# Patient Record
Sex: Female | Born: 1970 | Race: White | Hispanic: No | Marital: Married
Health system: Northeastern US, Community
[De-identification: ages and names within clinical notes are randomized; demographics above are authoritative.]

## PROBLEM LIST (undated history)

## (undated) VITALS — BP 143/80 | HR 93 | Temp 98.0°F | Resp 16 | Ht 64.0 in | Wt 280.0 lb

## (undated) DIAGNOSIS — K219 Gastro-esophageal reflux disease without esophagitis: Secondary | ICD-10-CM

## (undated) DIAGNOSIS — J45909 Unspecified asthma, uncomplicated: Secondary | ICD-10-CM

## (undated) DIAGNOSIS — A0472 Enterocolitis due to Clostridium difficile, not specified as recurrent: Secondary | ICD-10-CM

## (undated) DIAGNOSIS — T7840XA Allergy, unspecified, initial encounter: Secondary | ICD-10-CM

## (undated) DIAGNOSIS — K319 Disease of stomach and duodenum, unspecified: Secondary | ICD-10-CM

## (undated) DIAGNOSIS — Z973 Presence of spectacles and contact lenses: Secondary | ICD-10-CM

## (undated) DIAGNOSIS — K2900 Acute gastritis without bleeding: Secondary | ICD-10-CM

## (undated) DIAGNOSIS — F419 Anxiety disorder, unspecified: Secondary | ICD-10-CM

## (undated) DIAGNOSIS — R0683 Snoring: Secondary | ICD-10-CM

## (undated) DIAGNOSIS — E7800 Pure hypercholesterolemia, unspecified: Secondary | ICD-10-CM

## (undated) HISTORY — DX: Acute gastritis without bleeding: K29.00

## (undated) HISTORY — PX: NO SIGNIFICANT SURGICAL HISTORY: 1000005

## (undated) HISTORY — DX: Enterocolitis due to Clostridium difficile, not specified as recurrent: A04.72

## (undated) HISTORY — PX: CHOLECYSTECTOMY: GID484

## (undated) HISTORY — DX: Allergy, unspecified, initial encounter: T78.40XA

## (undated) HISTORY — PX: OB ANTEPARTUM CARE CESAREAN DLVR & POSTPARTUM: REP299

---

## 2001-12-17 ENCOUNTER — Encounter (HOSPITAL_BASED_OUTPATIENT_CLINIC_OR_DEPARTMENT_OTHER): Payer: Charity | Admitting: Internal Medicine

## 2001-12-17 LAB — BLOOD COUNT COMPLETE AUTOMATED
HEMATOCRIT: 38 % (ref 37.0–47.0)
HEMOGLOBIN: 12.7 g/dL (ref 12.0–16.0)
MEAN CORP HGB CONC: 33.5 g/dL (ref 32.0–36.0)
MEAN CORPUSCULAR HGB: 30.8 pg (ref 27.0–31.0)
MEAN CORPUSCULAR VOL: 91.8 fL (ref 81.0–99.0)
MEAN PLATELET VOLUME: 9.1 fL (ref 6.4–10.8)
PLATELET COUNT: 251 10*3/uL (ref 150–400)
RBC DISTRIBUTION WIDTH: 12.1 % (ref 11.5–14.3)
RED BLOOD CELL COUNT: 4.13 MIL/uL — ABNORMAL LOW (ref 4.20–5.40)
WHITE BLOOD CELL COUNT: 10.2 10*3/uL (ref 4.8–10.8)

## 2001-12-17 LAB — CHG LIPID PANEL
Cholesterol: 224 mg/dl — ABNORMAL HIGH (ref ?–200)
HIGH DENSITY LIPOPROTEIN: 44 mg/dl (ref 35–95)
LOW DENSITY LIPOPROTEIN DIRECT: 172 mg/dl — ABNORMAL HIGH (ref ?–130)
RISK FACTOR: 5.1 — ABNORMAL HIGH (ref ?–4.4)
TRIGLYCERIDES: 136 mg/dl (ref 30–200)

## 2001-12-17 LAB — COMPREHENSIVE METABOLIC PANEL
ALANINE AMINOTRANSFERASE: 15 IU/L (ref 3–36)
ALBUMIN: 4.1 g/dl (ref 3.5–5.0)
ALKALINE PHOSPHATASE: 72 IU/L (ref 50–136)
ASPARTATE AMINOTRANSFERASE: 18 U/L (ref 7–29)
BILIRUBIN TOTAL: 0.6 mg/dl (ref 0.15–1.0)
BUN (UREA NITROGEN): 16 mg/dl (ref 10–20)
CALCIUM: 9.5 mg/dl (ref 8.5–10.5)
CARBON DIOXIDE: 28 mEQ/L (ref 22–32)
CHLORIDE: 103 mEQ/L (ref 98–110)
CREATININE: 0.8 mg/dl (ref 0.8–1.2)
Glucose Random: 85 mg/dl (ref 65–160)
POTASSIUM: 4.2 mEQ/L (ref 3.5–5.0)
SODIUM: 134 mEQ/L — ABNORMAL LOW (ref 135–145)
TOTAL PROTEIN: 7.3 g/dl (ref 6.2–8.5)

## 2001-12-17 LAB — THYROID SCREEN TSH REFLEX FT4: THYROID SCREEN TSH REFLEX FT4: 1.18 u[IU]/mL (ref 0.34–5.60)

## 2002-01-03 ENCOUNTER — Emergency Department (HOSPITAL_BASED_OUTPATIENT_CLINIC_OR_DEPARTMENT_OTHER): Payer: Self-pay | Admitting: Emergency Medicine

## 2002-01-04 ENCOUNTER — Ambulatory Visit (HOSPITAL_BASED_OUTPATIENT_CLINIC_OR_DEPARTMENT_OTHER): Payer: Self-pay | Admitting: Internal Medicine

## 2002-01-10 ENCOUNTER — Other Ambulatory Visit: Payer: Self-pay | Admitting: Internal Medicine

## 2002-01-25 ENCOUNTER — Ambulatory Visit (HOSPITAL_BASED_OUTPATIENT_CLINIC_OR_DEPARTMENT_OTHER): Payer: Self-pay | Admitting: Internal Medicine

## 2002-02-02 LAB — US TRANSVAGINAL NON-OB

## 2002-02-02 LAB — US PELVIC NON-PREGNANT

## 2002-02-18 ENCOUNTER — Ambulatory Visit (HOSPITAL_BASED_OUTPATIENT_CLINIC_OR_DEPARTMENT_OTHER): Payer: Self-pay | Admitting: Internal Medicine

## 2002-03-01 ENCOUNTER — Ambulatory Visit (HOSPITAL_BASED_OUTPATIENT_CLINIC_OR_DEPARTMENT_OTHER): Payer: Self-pay | Admitting: Internal Medicine

## 2002-03-20 ENCOUNTER — Ambulatory Visit (HOSPITAL_BASED_OUTPATIENT_CLINIC_OR_DEPARTMENT_OTHER): Payer: Self-pay | Admitting: Internal Medicine

## 2002-04-06 ENCOUNTER — Encounter: Payer: Self-pay | Admitting: OB/Gyn

## 2002-05-11 ENCOUNTER — Encounter (HOSPITAL_BASED_OUTPATIENT_CLINIC_OR_DEPARTMENT_OTHER): Payer: Self-pay | Admitting: Internal Medicine

## 2002-05-11 ENCOUNTER — Ambulatory Visit (HOSPITAL_BASED_OUTPATIENT_CLINIC_OR_DEPARTMENT_OTHER): Payer: Charity | Admitting: Internal Medicine

## 2002-05-11 VITALS — BP 106/76 | Wt 192.0 lb

## 2002-05-11 DIAGNOSIS — R5381 Other malaise: Secondary | ICD-10-CM

## 2002-05-11 DIAGNOSIS — R5383 Other fatigue: Secondary | ICD-10-CM

## 2002-05-11 DIAGNOSIS — N912 Amenorrhea, unspecified: Secondary | ICD-10-CM

## 2002-05-11 LAB — URINE PREGNANCY TEST (POINT OF CARE): HCG QUALITATIVE URINE: NEGATIVE

## 2002-05-11 NOTE — Progress Notes (Signed)
Alejandra Martin is a 32 year old female here because she has been feeling fatigued. She just switched to a new job which is less stressful, and she has noticed for the last week that she is tired. She is also trying to lose weight so she has been exercising an hour each day and eating less. She wonders if she is anemic.   She also stopped OCPs last months and is a few days late for her period. She is having some cramps so might be getting it.     PE:   S1 and S2 normal, no murmurs, clicks, gallops or rubs. Regular rate and rhythm. Chest is clear; no wheezes or rales.     PLAN:  amenorhea - HCG negative  fatigue - RTC next week to check blood work  FP- start MVI with folate

## 2002-05-18 ENCOUNTER — Other Ambulatory Visit (HOSPITAL_BASED_OUTPATIENT_CLINIC_OR_DEPARTMENT_OTHER): Payer: Charity | Admitting: Lab

## 2002-05-18 LAB — BLOOD COUNT COMPLETE AUTOMATED
HEMATOCRIT: 38.1 % (ref 37.0–47.0)
HEMOGLOBIN: 13 g/dL (ref 12.0–16.0)
MEAN CORP HGB CONC: 34.1 g/dL (ref 32.0–36.0)
MEAN CORPUSCULAR HGB: 31.7 pg — ABNORMAL HIGH (ref 27.0–31.0)
MEAN CORPUSCULAR VOL: 92.8 fL (ref 81.0–99.0)
MEAN PLATELET VOLUME: 8.6 fL (ref 6.4–10.8)
PLATELET COUNT: 262 10*3/uL (ref 150–400)
RBC DISTRIBUTION WIDTH: 12 % (ref 11.5–14.3)
RED BLOOD CELL COUNT: 4.11 MIL/uL — ABNORMAL LOW (ref 4.20–5.40)
WHITE BLOOD CELL COUNT: 5.7 10*3/uL (ref 4.8–10.8)

## 2002-05-18 LAB — BASIC METABOLIC PANEL
BUN (UREA NITROGEN): 14 mg/dl (ref 10–20)
CALCIUM: 9.9 mg/dl (ref 8.5–10.5)
CARBON DIOXIDE: 29 mEQ/L (ref 22–32)
CHLORIDE: 107 mEQ/L (ref 98–110)
CREATININE: 0.8 mg/dl (ref 0.8–1.2)
Glucose Random: 99 mg/dl (ref 65–160)
POTASSIUM: 4.8 mEQ/L (ref 3.5–5.0)
SODIUM: 139 mEQ/L (ref 135–145)

## 2002-05-18 LAB — RUBELLA IGG ANTIBODY: RUBELLA: IMMUNE

## 2002-06-28 ENCOUNTER — Telehealth (HOSPITAL_BASED_OUTPATIENT_CLINIC_OR_DEPARTMENT_OTHER): Payer: Self-pay | Admitting: Internal Medicine

## 2002-06-28 NOTE — Telephone Encounter (Signed)
>>   [name] Fri Jul 01, 2002 6:39 PM  She called becasue she is concerned about her husband mackenze grandison because he has had a persistent dry cough that keeps coming back.   - beasue he is a smoker, will check CXR (slip mailed to pt) and then have him come in for an appt and evaluation (? need for PPD)    >> [name] Fri Jul 01, 2002 2:36 PM  Last time her husband saw me he had a dry cough. may be allergies. [name]    >> LAURA OBBARD Fri Jul 01, 2002 9:20 AM  [name] called back yesterday, and I called her back this AM, but there was no answer t her work number [phone]) and I left a message at her home message machine    >> [name] Tue Jun 28, 2002 7:31 PM  I have attempted without success to contact this patient by phone to return their call and I left a message on answering machine at her home.      >> [name] Tue Jun 28, 2002 7:31 PM  Staff Message copied by [name] on 06/28/2002 at 7:31 PM  ------   Message from: Mardelle Matte   Created: 06/28/2002 at 1:23 PM    Alejandra Martin [id number], 32 year old, female, [phone] (home)    Calls today with questions and concerns. please call her at work [phone]

## 2002-10-17 ENCOUNTER — Telehealth (HOSPITAL_BASED_OUTPATIENT_CLINIC_OR_DEPARTMENT_OTHER): Payer: Self-pay

## 2002-10-17 NOTE — Telephone Encounter (Signed)
>>   MAURICE CONNORS Mon Oct 17, 2002 10:47 AM  I SPOKE WITH Sussie WHO IS ACTUALLY CALLING ABOUT HER HUSBADS COUGH STATES DR Domenica Reamer SAID RECENT CHEST XRAY OK HE IS A SMOKER AND I ADVISED Pearl TO CALL AND MAKE APPT NEXT WEEK WITH REGULAR PCP DR Domenica Reamer FOR HER HUSBAND

## 2003-04-10 ENCOUNTER — Other Ambulatory Visit: Payer: Self-pay | Admitting: Emergency Medicine

## 2003-04-10 ENCOUNTER — Emergency Department (HOSPITAL_BASED_OUTPATIENT_CLINIC_OR_DEPARTMENT_OTHER): Payer: Self-pay

## 2003-04-10 LAB — URINALYSIS
BACTERIA: <10 PER HPF — AB (ref 0–?)
BILIRUBIN, URINE: NEGATIVE
CASTS: NONE SEEN PER LPF
CRYSTALS: NONE SEEN
GLUCOSE, URINE: NEGATIVE MG/DL
KETONE, URINE: NEGATIVE MG/DL
LEUKOCYTE ESTERASE: NEGATIVE
NITRITE, URINE: NEGATIVE
PH URINE: 6.5 (ref 5.0–8.0)
SPECIFIC GRAVITY URINE: 1.015 (ref 1.003–1.035)

## 2003-04-10 LAB — BLOOD COUNT COMPLETE AUTO&AUTO DIFRNTL WBC
BASOPHIL %: 1 % (ref 0–2)
EOSINOPHIL %: 0.8 % (ref 0–5)
HEMATOCRIT: 39.8 % (ref 34.9–46.9)
HEMOGLOBIN: 13.1 g/dL (ref 11.7–15.7)
LYMPHOCYTE %: 26.2 % (ref 20–45)
MEAN CORP HGB CONC: 32.8 g/dL (ref 31.4–35.8)
MEAN CORPUSCULAR HGB: 31.7 pg (ref 26.4–34.0)
MEAN CORPUSCULAR VOL: 96.6 fl (ref 80.5–99.7)
MEAN PLATELET VOLUME: 10.3 fL (ref 6.8–12.8)
MONOCYTE %: 5.3 % (ref 0–12)
NEUTROPHIL %: 66.7 % (ref 40–70)
PLATELET COUNT: 306 10*3/uL (ref 130–400)
RBC DISTRIBUTION WIDTH: 11.3 % (ref 11.0–15.0)
RED BLOOD CELL COUNT: 4.12 M/uL (ref 3.80–5.20)
WHITE BLOOD CELL COUNT: 11.2 10*3/uL — ABNORMAL HIGH (ref 4.5–11.0)

## 2003-04-10 LAB — COMPREHENSIVE METABOLIC PANEL
ALANINE AMINOTRANSFERASE: 11 U/L (ref 0–31)
ALBUMIN: 4.4 g/dl (ref 3.2–4.9)
ALKALINE PHOSPHATASE: 76 U/L (ref 30–115)
ASPARTATE AMINOTRANSFERASE: 15 U/L (ref 0–31)
BILIRUBIN TOTAL: 0.1 mg/dl — ABNORMAL LOW (ref 0.2–1.2)
BUN (UREA NITROGEN): 15 mg/dl (ref 6–20)
CALCIUM: 9.9 mg/dl (ref 8.4–10.2)
CARBON DIOXIDE: 28 mEQ/L (ref 22–31)
CHLORIDE: 103 mEQ/L (ref 96–106)
CREATININE: 0.6 mg/dl (ref 0.4–1.2)
Glucose Random: 90 mg/dl (ref 70–110)
POTASSIUM: 4.1 mEQ/L (ref 3.4–5.0)
SODIUM: 138 mEQ/L (ref 134–145)
TOTAL PROTEIN: 7.9 g/dl (ref 6.5–8.4)

## 2003-04-10 LAB — PATIENT ABO/RH CONFIRM

## 2003-04-10 LAB — HCG QUANTITATIVE: HCG QUANTITATIVE: 8214 m[IU]/mL — ABNORMAL HIGH (ref 1–3)

## 2003-04-10 LAB — TYPE AND SCREEN

## 2003-04-11 ENCOUNTER — Telehealth (HOSPITAL_BASED_OUTPATIENT_CLINIC_OR_DEPARTMENT_OTHER): Payer: Self-pay

## 2003-04-11 LAB — US PREG UTERUS REAL TIME W/IMAGE DCMTN TRANSVAG

## 2003-04-11 NOTE — Telephone Encounter (Signed)
Pt called for cramps and bleeding with pregnancy. Called pt who states she had some bleeding over the weekend and went to the [facility] where she states she had blood work and an ultra sound and was told that she is "ok" but that she needs to be seen by her PCP or ob gyn. Pt states she was also told that if the bleeding and cramping gets worse she should return to the ER. Pt asked about being seen at [facility] for ob gyn. Pt was advised to call today to set up an appointment and to tell them when she calls that she has had some bleeding and would like to be seen as soon as possible. Pt states that today she is feeling better. It was reinforce to the pt that if she does have any more bleeding that she should be seen in the ER again. Pt agrees to plan.

## 2003-04-12 ENCOUNTER — Ambulatory Visit: Payer: Self-pay

## 2003-04-12 LAB — HCG QUANTITATIVE: HCG QUANTITATIVE: 6643 m[IU]/mL — ABNORMAL HIGH (ref 1–3)

## 2003-04-12 LAB — URINALYSIS
BACTERIA: <10 PER HPF — AB (ref 0–?)
BILIRUBIN, URINE: NEGATIVE
CASTS: NONE SEEN PER LPF
CRYSTALS: NONE SEEN
GLUCOSE, URINE: NEGATIVE MG/DL
KETONE, URINE: NEGATIVE MG/DL
LEUKOCYTE ESTERASE: NEGATIVE
NITRITE, URINE: NEGATIVE
PH URINE: 6 (ref 5.0–8.0)
PROTEIN, URINE: NEGATIVE MG/DL
SPECIFIC GRAVITY URINE: 1.02 (ref 1.003–1.035)

## 2003-04-12 LAB — URINE CULTURE/COLONY COUNT: URINE CULTURE/COLONY COUNT: NO GROWTH

## 2003-04-13 LAB — URINE CULTURE/COLONY COUNT

## 2003-04-14 ENCOUNTER — Encounter (HOSPITAL_BASED_OUTPATIENT_CLINIC_OR_DEPARTMENT_OTHER): Payer: Self-pay

## 2003-04-14 ENCOUNTER — Encounter: Payer: Self-pay | Admitting: Primary Care

## 2003-04-19 ENCOUNTER — Ambulatory Visit: Payer: Self-pay | Admitting: Primary Care

## 2003-04-19 DIAGNOSIS — O039 Complete or unspecified spontaneous abortion without complication: Secondary | ICD-10-CM

## 2003-04-19 LAB — HCG QUANTITATIVE: HCG QUANTITATIVE: 368 m[IU]/mL — ABNORMAL HIGH (ref 1–3)

## 2003-04-19 LAB — THYROID SCREEN TSH REFLEX FT4: THYROID SCREEN TSH REFLEX FT4: 1.69 u[IU]/mL (ref 0.34–5.60)

## 2003-04-25 ENCOUNTER — Other Ambulatory Visit: Payer: Self-pay | Admitting: Primary Care

## 2003-04-25 DIAGNOSIS — Z09 Encounter for follow-up examination after completed treatment for conditions other than malignant neoplasm: Secondary | ICD-10-CM

## 2003-04-25 DIAGNOSIS — N83209 Unspecified ovarian cyst, unspecified side: Secondary | ICD-10-CM

## 2003-05-05 ENCOUNTER — Other Ambulatory Visit: Payer: Self-pay | Admitting: Primary Care

## 2003-05-05 DIAGNOSIS — O99810 Abnormal glucose complicating pregnancy: Secondary | ICD-10-CM

## 2003-05-05 LAB — HCG QUANTITATIVE: HCG QUANTITATIVE: 13 m[IU]/mL — ABNORMAL HIGH (ref 1–3)

## 2003-05-05 LAB — GLUCOSE FASTING: GLUCOSE FASTING: 79 mg/dl (ref 65–110)

## 2003-05-08 LAB — US PELVIC NON-PREGNANT

## 2003-05-08 LAB — US TRANSVAGINAL NON-OB

## 2003-06-21 ENCOUNTER — Encounter: Payer: Self-pay | Admitting: Primary Care

## 2003-06-28 ENCOUNTER — Encounter: Payer: Self-pay | Admitting: Primary Care

## 2003-07-04 ENCOUNTER — Other Ambulatory Visit: Payer: Self-pay | Admitting: Primary Care

## 2003-07-04 ENCOUNTER — Encounter: Payer: Self-pay | Admitting: Obstetrics & Gynecology

## 2003-07-04 DIAGNOSIS — Z3400 Encounter for supervision of normal first pregnancy, unspecified trimester: Secondary | ICD-10-CM

## 2003-07-06 LAB — HCG QUANTITATIVE: HCG QUANTITATIVE: <1 m[IU]/mL — ABNORMAL LOW (ref 1–3)

## 2003-08-11 ENCOUNTER — Ambulatory Visit: Payer: Self-pay | Admitting: Obstetrics & Gynecology

## 2003-08-11 ENCOUNTER — Ambulatory Visit (HOSPITAL_BASED_OUTPATIENT_CLINIC_OR_DEPARTMENT_OTHER): Payer: Self-pay | Admitting: Obstetrics & Gynecology

## 2003-08-14 LAB — IADNA CHLAMYDIA TRACHOMATIS DIRECT PROBE TQ: GENPROBE CHLAMYDIA: NEGATIVE

## 2003-08-14 LAB — IADNA NEISSERIA GONORRHOEAE DIRECT PROBE TQ: GENPROBE GC: NEGATIVE

## 2003-09-06 ENCOUNTER — Ambulatory Visit (HOSPITAL_BASED_OUTPATIENT_CLINIC_OR_DEPARTMENT_OTHER): Payer: Charity | Admitting: Family

## 2003-09-06 VITALS — BP 120/70 | Wt 202.0 lb

## 2003-09-06 DIAGNOSIS — N926 Irregular menstruation, unspecified: Secondary | ICD-10-CM

## 2003-09-06 LAB — BLOOD COUNT COMPLETE AUTO&AUTO DIFRNTL WBC
BASOPHIL %: 0.5 % (ref 0–2)
EOSINOPHIL %: 0.9 % (ref 0–7)
HEMATOCRIT: 36.8 % — ABNORMAL LOW (ref 37.0–47.0)
HEMOGLOBIN: 12.8 g/dL (ref 12.0–16.0)
LYMPHOCYTE %: 31 % (ref 12–39)
MEAN CORP HGB CONC: 34.8 g/dL (ref 32.0–36.0)
MEAN CORPUSCULAR HGB: 32.5 pg — ABNORMAL HIGH (ref 27.0–31.0)
MEAN CORPUSCULAR VOL: 93.2 fL (ref 81.0–99.0)
MEAN PLATELET VOLUME: 8.8 fL (ref 6.4–10.8)
MONOCYTE %: 5 % (ref 1–12)
NEUTROPHIL %: 62.6 % (ref 46–79)
PLATELET COUNT: 275 10*3/uL (ref 150–400)
RBC DISTRIBUTION WIDTH: 12 % (ref 11.5–14.3)
RED BLOOD CELL COUNT: 3.95 MIL/uL — ABNORMAL LOW (ref 4.20–5.40)
WHITE BLOOD CELL COUNT: 9.9 10*3/uL (ref 4.8–10.8)

## 2003-09-06 LAB — HCG QUANTITATIVE: HCG QUANTITATIVE: <1 m[IU]/mL — ABNORMAL LOW (ref 1–3)

## 2003-09-06 MED ORDER — STUARTNATAL PLUS OR TABS
ORAL_TABLET | ORAL | Status: DC
Start: 2003-09-06 — End: 2004-04-03

## 2003-09-06 NOTE — Progress Notes (Signed)
S:Has SAB in February. Is anxious to be pregnant. Was 5 wks when had SAB. Many questions. Feeling well. Denies dizziness. Taking folic acid. No smoke, ETOH, drugs. has always had irreg menses.  O:alert, well app  sclera clear  cor RRR No MM  A:irreg menses  P:discussed BBT, menstrual calendar. Encouraged to take vitamin. No ETOH.  RTC in 3 months with calendar.

## 2003-09-07 ENCOUNTER — Encounter: Payer: Self-pay | Admitting: Obstetrics & Gynecology

## 2003-10-03 ENCOUNTER — Telehealth (HOSPITAL_BASED_OUTPATIENT_CLINIC_OR_DEPARTMENT_OTHER): Payer: Self-pay

## 2003-10-03 NOTE — Telephone Encounter (Signed)
pt states that she has been having a sore throat and headache x one week. pt did see [place] for this but she has not gotten any better. pt states that her neck is stating to hurt. pt states that something is in her throat it is dry and she feel as though there is something in her throat she has to keep clearing her throat to get relief. her ears are itchy as well [phone] pt is available until 4:30 PM. message forwarded to pcp for advice

## 2003-10-04 ENCOUNTER — Ambulatory Visit (HOSPITAL_BASED_OUTPATIENT_CLINIC_OR_DEPARTMENT_OTHER): Payer: Charity | Admitting: Family

## 2003-10-04 ENCOUNTER — Telehealth (HOSPITAL_BASED_OUTPATIENT_CLINIC_OR_DEPARTMENT_OTHER): Payer: Self-pay

## 2003-10-04 VITALS — BP 100/70 | Temp 97.6°F | Wt 204.0 lb

## 2003-10-04 DIAGNOSIS — J019 Acute sinusitis, unspecified: Secondary | ICD-10-CM

## 2003-10-04 MED ORDER — AMOXICILLIN 500 MG PO TABS
ORAL_TABLET | ORAL | Status: AC
Start: 2003-10-04 — End: 2003-10-14

## 2003-10-04 NOTE — Progress Notes (Signed)
Chief Complaint: Sinus pain and headache for 14 days.    Subjective:  Alejandra Martin is a 33 year old female presents for evaluation and treatment of the following symptoms: sinus congestion and facial pain, purulent nasal discharge, sore throat, non-productive cough. The patient denies fever, chills, sweats, nausea, vomiting.    Review of Systems:  ENT: Denies itching of the eyes or nose, sneezing, watery eyes. No history of seasonal allergies.  PMH/PSH: No prior sinus surgery or trauma.    Smoking status: non    No hospital prescriptions on file as of 10/04/03.  outpatient prescriptions as of 10/04/03:  AMOXICILLIN 500 MG OR TABS,1 TABLET BY MOUTH THREE TIMES A DAY FOR 10 DAYS,Disp: 30,Rfl: 0  STUARTNATAL PLUS OR TABS,1 TABLET DAILY,Disp: 30,Rfl: 2        Objective: BP 100/70   Temp 97.6   Wt 204 lbs (92.5kg)  General: non-toxic, well hydrated. Alert and oriented X 3.  Eyes: no redness or discharge.  Ears: TM's clear bilaterally. Hearing normal to conversation.  Nose: mild erythema, purulent rhinorrhea.  Sinuses: percussion tenderness over the frontal sinuses. Oropharynx: no erythema or exudate.  Neck: supple with no enlarged glands.  Lungs: CTA bilaterally. Normal respiratory effort.  Heart: no murmur, RRR.    Assessment / Plan:  461.9 ACUTE SINUSITIS NOS (primary encounter diagnosis)  Note:   Plan: AMOXICILLIN 500 MG OR TABS         1) Symptomatic treatment with saline nasal spray, salt water gargle, fluids, rest and steam if available.  2) Encourage Vit. C and good hygiene.  3) Recheck as needed for persistence, worsening, appearance of new symptoms.  4) Meds per orders.

## 2003-10-04 NOTE — Telephone Encounter (Signed)
pt given an appt today for an evaluation

## 2003-10-04 NOTE — Telephone Encounter (Signed)
please see Sarina Ser encounter from yesterday

## 2003-10-04 NOTE — Telephone Encounter (Signed)
pt was given an appt today

## 2003-10-04 NOTE — Telephone Encounter (Signed)
sinus problem for about one week    she did also state she feels she has to clear back of her throat a lot  but denies cough  also complaining of right ear ache  ? ear infec. also

## 2003-10-04 NOTE — Telephone Encounter (Signed)
for the past week pt complaining of sinus problems  pt is not sure if she has been running fevers  hot flashes at times  states headaches,itchy eyes,sneezing  does have hx of allergies and taking allergy med but states no relief  denies resp issues and no cough    wants to see dr Domenica Reamer tomm after 4pm    Dr Domenica Reamer should I add her on   I offered her app with dr Eustaquio Boyden at 12:20 tomm but she cant come....    told pt will call her back   she is in agreement with plan

## 2003-10-12 ENCOUNTER — Telehealth (HOSPITAL_BASED_OUTPATIENT_CLINIC_OR_DEPARTMENT_OTHER): Payer: Self-pay

## 2003-10-12 DIAGNOSIS — N390 Urinary tract infection, site not specified: Secondary | ICD-10-CM

## 2003-10-12 NOTE — Telephone Encounter (Signed)
I have attempted without success to contact this patient by phone to return their call and there was no answer.

## 2003-10-12 NOTE — Telephone Encounter (Signed)
Contact [phone]   Phone Pt Work Pt Home   [phone]      Call Date 10/12/2003   Call Time 4:00 PM      --------------------------------------------------------------------------------        Message Shevonne Wolf [id number], 33 year old, female, [phone] (home) [phone] (work)    Cell phone:   Other phone:    Available times:    Person calling on behalf of patient: patient (self)    Calls today with a sick call: UTI.     DYSURIA FEMALE    Sanjuanita Condrey, 33 year old, female  Patient calls complaining of UTI      Patient complains of: urgency, hematuria and burning for 2 days.   dtol dpt that she had large amts of leukocytes an dmod blood.  If the answer to any of the following questions is YES then a provider visit is indicated, unless the provider orders an alternative treatment plan.    If the Patient requests to be seen a provider visit should be scheduled.    Additional symptoms; vaginal discharge.    TELEPHONE / HOME CARE ADVICE: Drink cranberry juice. 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    pt can be reach at home after six if pcp wants to speak with her.

## 2003-10-13 MED ORDER — FLUCONAZOLE 150 MG PO TABS
ORAL_TABLET | ORAL | Status: AC
Start: 2003-10-13 — End: 2003-10-20

## 2003-10-13 MED ORDER — BACTRIM DS 800-160 MG PO TABS
ORAL_TABLET | ORAL | Status: AC
Start: 2003-10-13 — End: 2003-10-16

## 2003-10-13 NOTE — Telephone Encounter (Signed)
I spoke with Alejandra Martin, she has been having some dysuria since she came in and was given abx for her sinuses  said she has a UTI, some frequency, had a dip with some LE and mod blood, and also some white discharge.   -she will complete the amox  - can use sudafed for congestion  - use fluconazole for candida, one now and one at teh end of ehr abx tx  - bactrim for / UTI  - she will make a appt with hte discharge/ burning not improved

## 2003-10-18 ENCOUNTER — Telehealth (HOSPITAL_BASED_OUTPATIENT_CLINIC_OR_DEPARTMENT_OTHER): Payer: Self-pay | Admitting: Family

## 2003-10-19 ENCOUNTER — Other Ambulatory Visit (HOSPITAL_BASED_OUTPATIENT_CLINIC_OR_DEPARTMENT_OTHER): Payer: Self-pay

## 2003-10-19 MED ORDER — CLOTRIMAZOLE-7 1 % VA CREA
TOPICAL_CREAM | VAGINAL | Status: AC
Start: 2003-10-19 — End: 2003-10-26

## 2003-10-19 MED ORDER — FLONASE 50 MCG/DOSE NA INHA
NASAL | Status: DC
Start: 2003-10-19 — End: 2003-11-17

## 2003-10-19 NOTE — Telephone Encounter (Signed)
telephone call from [place] who spoke with pt via the phone and she is ordering the two medications above.

## 2003-10-20 ENCOUNTER — Other Ambulatory Visit (HOSPITAL_BASED_OUTPATIENT_CLINIC_OR_DEPARTMENT_OTHER): Payer: Self-pay | Admitting: Family

## 2003-11-01 ENCOUNTER — Encounter: Payer: Self-pay | Admitting: Primary Care

## 2003-11-13 LAB — URINALYSIS
BILIRUBIN, URINE: NEGATIVE
GLUCOSE, URINE: NEGATIVE MG/DL
KETONE, URINE: NEGATIVE MG/DL
LEUKOCYTE ESTERASE: NEGATIVE
NITRITE, URINE: NEGATIVE
OCCULT BLOOD, URINE: NEGATIVE
PH URINE: 6 (ref 5.0–8.0)
PROTEIN, URINE: NEGATIVE MG/DL
SPECIFIC GRAVITY URINE: 1.025 (ref 1.003–1.035)

## 2003-11-13 LAB — CYTOPATH, C/V, THIN LAYER

## 2003-11-14 LAB — IADNA CHLAMYDIA TRACHOMATIS DIRECT PROBE TQ: GENPROBE CHLAMYDIA: NEGATIVE

## 2003-11-14 LAB — IADNA NEISSERIA GONORRHOEAE DIRECT PROBE TQ: GENPROBE GC: NEGATIVE

## 2003-11-14 LAB — URINE CULTURE/COLONY COUNT: URINE CULTURE/COLONY COUNT: NO GROWTH

## 2003-11-16 ENCOUNTER — Telehealth (HOSPITAL_BASED_OUTPATIENT_CLINIC_OR_DEPARTMENT_OTHER): Payer: Self-pay | Admitting: Internal Medicine

## 2003-11-16 NOTE — Telephone Encounter (Signed)
I have attempted without success to contact this patient by phone to return their call but there was no answer.

## 2003-11-17 ENCOUNTER — Telehealth (HOSPITAL_BASED_OUTPATIENT_CLINIC_OR_DEPARTMENT_OTHER): Payer: Self-pay | Admitting: Internal Medicine

## 2003-11-17 MED ORDER — FLONASE 50 MCG/DOSE NA INHA
NASAL | Status: DC
Start: 2003-11-17 — End: 2004-04-03

## 2003-11-17 MED ORDER — CLARITIN 10 MG PO TABS
ORAL_TABLET | ORAL | Status: DC
Start: 2003-11-17 — End: 2004-04-03

## 2003-11-17 NOTE — Telephone Encounter (Signed)
I spoke with Alejandra Martin, she has some postnasal drip, gets better for a day then worse again, better with allergy meds but then it comes back  also she feels like she has some vaginal discharge  - agrees to [employer]  - will make an appt for discharge

## 2003-11-20 ENCOUNTER — Ambulatory Visit: Payer: Self-pay | Admitting: Primary Care

## 2003-11-29 ENCOUNTER — Encounter (HOSPITAL_BASED_OUTPATIENT_CLINIC_OR_DEPARTMENT_OTHER): Payer: Charity | Admitting: Family

## 2003-12-04 ENCOUNTER — Telehealth (HOSPITAL_BASED_OUTPATIENT_CLINIC_OR_DEPARTMENT_OTHER): Payer: Self-pay

## 2003-12-04 NOTE — Telephone Encounter (Signed)
called pt several times  unable to reach pt  will await call back

## 2003-12-04 NOTE — Telephone Encounter (Signed)
called home and husband answered  he stated Alejandra Martin was not home  left message with him and will await call back

## 2003-12-05 ENCOUNTER — Telehealth (HOSPITAL_BASED_OUTPATIENT_CLINIC_OR_DEPARTMENT_OTHER): Payer: Self-pay | Admitting: Infectious Diseases

## 2003-12-05 NOTE — Telephone Encounter (Signed)
TC. LM @ pt's home to call RN [phone].

## 2003-12-06 ENCOUNTER — Telehealth (HOSPITAL_BASED_OUTPATIENT_CLINIC_OR_DEPARTMENT_OTHER): Payer: Self-pay | Admitting: Internal Medicine

## 2003-12-06 DIAGNOSIS — N912 Amenorrhea, unspecified: Secondary | ICD-10-CM

## 2003-12-06 NOTE — Telephone Encounter (Signed)
PATIENT CAME TO FRONT DESK AT 6;15 STATED SHE IS GOING TO E.R FOR PREG TEST.

## 2003-12-06 NOTE — Telephone Encounter (Signed)
I spoke with Alejandra Martin, she is 2 weeks late for her period, LMP 9/5, and took a t urine test at the [place] office where she works which was negaive.   she is having some cramping, os I told her that it is likely that ehr period is coming  if not, she will come in next Wednesday for lab only to check blood test. (will call for lab only)

## 2004-01-05 ENCOUNTER — Telehealth (HOSPITAL_BASED_OUTPATIENT_CLINIC_OR_DEPARTMENT_OTHER): Payer: Self-pay | Admitting: Registered Nurse

## 2004-01-05 NOTE — Telephone Encounter (Signed)
She is calling about her husband, Berneda Rose. I will create a new encounter for him.

## 2004-01-17 ENCOUNTER — Ambulatory Visit (HOSPITAL_BASED_OUTPATIENT_CLINIC_OR_DEPARTMENT_OTHER): Payer: Charity | Admitting: Primary Care

## 2004-01-17 VITALS — Temp 98.5°F | Wt 206.0 lb

## 2004-01-17 DIAGNOSIS — N979 Female infertility, unspecified: Secondary | ICD-10-CM

## 2004-01-17 NOTE — Progress Notes (Signed)
SUBJECTIVE:  33 yo G1P0 SAB1 here to discuss concerns about infertility  Her SAB occurred in @/2005 and she and her husband have been trying to get pregnant since May.  Prior to becoming pregnant they had been trying to conceive for 3 years. The SAB was at [redacted] wks gestational age  Her cycle is irregular from 31-42 days but she rarely skips a month.  Her mother has DM2 and she herself is obese and has recently gained 7 #.  OBJECTIVE:  obese otherwise well appearing inno acute distress  nl pelvic u/s in [employer] showed resolution of a complex ovarian cyst which was seen on U/S in 2/20005 related to bleeding in early pregnancy at the time of her SAB  ASSESSMENT:  infertility  PLAN:  will do labs for PCOS, fasting glucose and insulin and refer to gyn  COUNSELING:  RE: possible relationship of overwieght/insulin resistance to anovulatory cycles and difficulty conceiving, female factors also possible  fertility cycle discussd and pt will keep menstrual calendar  infertility

## 2004-01-18 ENCOUNTER — Other Ambulatory Visit (HOSPITAL_BASED_OUTPATIENT_CLINIC_OR_DEPARTMENT_OTHER): Payer: Self-pay | Admitting: Primary Care

## 2004-01-18 DIAGNOSIS — N979 Female infertility, unspecified: Secondary | ICD-10-CM

## 2004-01-22 ENCOUNTER — Other Ambulatory Visit (HOSPITAL_BASED_OUTPATIENT_CLINIC_OR_DEPARTMENT_OTHER): Payer: Charity | Admitting: Lab

## 2004-01-22 ENCOUNTER — Telehealth (HOSPITAL_BASED_OUTPATIENT_CLINIC_OR_DEPARTMENT_OTHER): Payer: Self-pay | Admitting: Primary Care

## 2004-01-22 ENCOUNTER — Ambulatory Visit (HOSPITAL_BASED_OUTPATIENT_CLINIC_OR_DEPARTMENT_OTHER): Payer: Charity

## 2004-01-22 VITALS — BP 118/80 | HR 64 | Temp 97.7°F | Wt 204.0 lb

## 2004-01-22 DIAGNOSIS — T7840XA Allergy, unspecified, initial encounter: Secondary | ICD-10-CM

## 2004-01-22 DIAGNOSIS — R5381 Other malaise: Secondary | ICD-10-CM

## 2004-01-22 DIAGNOSIS — J019 Acute sinusitis, unspecified: Secondary | ICD-10-CM

## 2004-01-22 DIAGNOSIS — R5383 Other fatigue: Secondary | ICD-10-CM

## 2004-01-22 DIAGNOSIS — N979 Female infertility, unspecified: Secondary | ICD-10-CM

## 2004-01-22 LAB — COMPREHENSIVE METABOLIC PANEL
ALANINE AMINOTRANSFERASE: 18 IU/L (ref 7–35)
ALBUMIN: 4.4 g/dl (ref 3.4–4.8)
ALKALINE PHOSPHATASE: 85 IU/L (ref 25–106)
ASPARTATE AMINOTRANSFERASE: 20 IU/L (ref 8–34)
BILIRUBIN TOTAL: 0.6 mg/dl (ref 0.2–1.1)
BUN (UREA NITROGEN): 17 mg/dl (ref 6–20)
CALCIUM: 9.7 mg/dl (ref 8.6–10.0)
CARBON DIOXIDE: 26 mmol/L (ref 22–32)
CHLORIDE: 103 mmol/L (ref 101–111)
CREATININE: 0.8 mg/dl (ref 0.4–1.2)
Glucose Random: 79 mg/dl (ref 74–160)
POTASSIUM: 4 mmol/L (ref 3.5–5.1)
SODIUM: 138 mmol/L (ref 135–144)
TOTAL PROTEIN: 7.5 g/dl (ref 5.9–7.5)

## 2004-01-22 LAB — BLOOD COUNT COMPLETE AUTO&AUTO DIFRNTL WBC
BASOPHIL %: 0.4 % (ref 0–2)
EOSINOPHIL %: 0.4 % (ref 0–7)
HEMATOCRIT: 39.2 % (ref 37.0–47.0)
HEMOGLOBIN: 13.5 g/dL (ref 12.0–16.0)
LYMPHOCYTE %: 33 % (ref 12–39)
MEAN CORP HGB CONC: 34.4 g/dL (ref 32.0–36.0)
MEAN CORPUSCULAR HGB: 31.5 pg — ABNORMAL HIGH (ref 27.0–31.0)
MEAN CORPUSCULAR VOL: 91.4 fL (ref 81.0–99.0)
MEAN PLATELET VOLUME: 8.2 fL (ref 6.4–10.8)
MONOCYTE %: 4 % (ref 1–12)
NEUTROPHIL %: 62.2 % (ref 46–79)
PLATELET COUNT: 308 10*3/uL (ref 150–400)
RBC DISTRIBUTION WIDTH: 12.3 % (ref 11.5–14.3)
RED BLOOD CELL COUNT: 4.29 MIL/uL (ref 4.20–5.40)
WHITE BLOOD CELL COUNT: 10.4 10*3/uL (ref 4.8–10.8)

## 2004-01-22 LAB — CHG ASSAY OF PROLACTIN: PROLACTIN: 10.69 ng/ml (ref 3.34–26.72)

## 2004-01-22 LAB — THYROID SCREEN TSH REFLEX FT4: THYROID SCREEN TSH REFLEX FT4: 2.15 u[IU]/mL (ref 0.34–5.60)

## 2004-01-22 LAB — CHG GONADOTROPIN FOLLICLE STIMULATING HORMONE: FOLLICLE STIMULATING HORMONE: 4.28 m[IU]/mL

## 2004-01-22 LAB — CHG GONADOTROPIN LUTEINIZING HORMONE: LUTEINIZING HORMONE (LH): 4.25 m[IU]/mL

## 2004-01-22 MED ORDER — MICONAZOLE 7 2 % VA CREA
TOPICAL_CREAM | VAGINAL | Status: DC
Start: 2004-01-22 — End: 2004-04-03

## 2004-01-22 MED ORDER — LORATADINE 10 MG PO TABS
ORAL_TABLET | ORAL | Status: DC
Start: 2004-01-22 — End: 2004-04-03

## 2004-01-22 MED ORDER — CEPHALEXIN 500 MG PO CAPS
ORAL_CAPSULE | ORAL | Status: DC
Start: 2004-01-22 — End: 2004-03-25

## 2004-01-22 NOTE — Nursing Note (Signed)
>>   Alejandra Martin     01/26/2004   4:58 pm  Labs drawn.

## 2004-01-22 NOTE — Telephone Encounter (Signed)
TC to pt.   LM on her answering machine  reiterated hours of operation of our lab and apologized for inconvenience of pt coming here after hours and was not able to have her blood work done

## 2004-01-22 NOTE — Progress Notes (Signed)
Chief Complaint: Sinus pain and headache for 7 days.    Subjective:  Alejandra Martin is a 33 year old female presents for evaluation and treatment of the following symptoms: sinus congestion and facial pain, purulent nasal discharge, sore throat, non-productive cough. The patient denies fever, chills, sweats, nausea, vomiting.    Review of Systems:  ENT: Denies itching of the eyes or nose, sneezing, watery eyes. No history of seasonal allergies.  PMH/PSH: No prior sinus surgery or trauma.    Smoking status: nonsmoker    No hospital prescriptions on file as of 01/22/04.  outpatient prescriptions as of 01/22/04:  LORATADINE 10 MG OR TABS,1 TABLET DAILY,Disp: 30,Rfl: 0  CEPHALEXIN 500 MG OR CAPS,ont three times daily,Disp: 30,Rfl: 0  MICONAZOLE 7 2 % VA CREA,one applicator intravaginally hs x 7,Disp: 1 week supply,Rfl: 2  FLONASE 50 MCG/DOSE NA INHA,two sprays to each nostril once a day ,Disp: 1,Rfl: 5  CLARITIN 10 MG OR TABS,1 TABLET BY MOUTH DAILY AS NEEDED;NOT MORE THAN ONE TABLET DAILY,Disp: 30,Rfl: 5  STUARTNATAL PLUS OR TABS,1 TABLET DAILY,Disp: 30,Rfl: 2        Objective: BP 118/80   Pulse 64   Temp (Src) 97.7 (Oral)   Wt 204 lbs (92.5kg)  General: non-toxic, well hydrated. Alert and oriented X 3.  Eyes: no redness or discharge.  Ears: TM's clear bilaterally. Hearing normal to conversation.  Nose: mild erythema, purulent rhinorrhea.  Sinuses: percussion tenderness over the maxillary sinuses. Poor transillumination.  Oropharynx: no erythema or exudate.  Neck: supple with no enlarged glands.  Lungs: CTA bilaterally. Normal respiratory effort.  Heart: no murmur, RRR.    Assessment / Plan:  461.9 ACUTE SINUSITIS NOS (primary encounter diagnosis)    Plan: CEPHALEXIN 500 MG OR CAPS, MICONAZOLE 7 2 % VA    CREA, REFERRAL TO ENT (INT)       995.3 ALLERGY, UNSPECIFI  Plan: LORATADINE 10 MG OR TAB    780.79 MALAISE AND FATIGUE N  Plan: COMPLETE CBC W/AUTO DIFF WBC, THYROID SCREEN    TSH, COMPREHENSIVE METABOLIC PANEL   098.1  ACUTE SINUSITIS NOS (primary encounter diagnosis  Plan: CEPHALEXIN 500 MG OR CAPS, MICONAZOLE 7 2 % VA    CREA, REFERRAL TO ENT (INT)       995.3 ALLERGY, UNSPECIFI  Plan: LORATADINE 10 MG OR TABS       780.79 MALAISE AND FATIGUE   Plan: COMPLETE CBC W/AUTO DIFF WBC, THYROID SCREEN    TSH, COMPREHENSIVE METABOLIC PANEL             1) Symptomatic treatment with saline nasal spray, salt water gargle, fluids, rest and steam if available.  2) Encourage Vit. C and good hygiene.  3) Recheck as needed for persistence, worsening, appearance of new symptoms.  4) Meds per orders.

## 2004-01-22 NOTE — Progress Notes (Signed)
Addended by: Gertie Baron on: 01/26/2004 5:25:37 PM     Modules accepted: Orders

## 2004-01-23 ENCOUNTER — Encounter (HOSPITAL_BASED_OUTPATIENT_CLINIC_OR_DEPARTMENT_OTHER): Payer: Charity

## 2004-01-24 LAB — INSULIN: INSULIN: 5.2 u[IU]/mL — AB (ref 6.0–27.0)

## 2004-01-24 LAB — DHEA SULFATE: DHEA SULFATE: 140 ug/dL (ref 45–270)

## 2004-01-29 ENCOUNTER — Telehealth (HOSPITAL_BASED_OUTPATIENT_CLINIC_OR_DEPARTMENT_OTHER): Payer: Self-pay | Admitting: Infectious Diseases

## 2004-01-29 ENCOUNTER — Telehealth (HOSPITAL_BASED_OUTPATIENT_CLINIC_OR_DEPARTMENT_OTHER): Payer: Self-pay | Admitting: Primary Care

## 2004-01-29 NOTE — Telephone Encounter (Signed)
TC to pt at work. no longer works there.  TC to pt at home. Ans machine LM for pt. WIll send letter with lab results.

## 2004-01-29 NOTE — Telephone Encounter (Signed)
forward to gyn provider for review. Unclear who ordered labs.

## 2004-01-31 ENCOUNTER — Encounter (HOSPITAL_BASED_OUTPATIENT_CLINIC_OR_DEPARTMENT_OTHER): Payer: Charity | Admitting: Internal Medicine

## 2004-02-07 ENCOUNTER — Telehealth (HOSPITAL_BASED_OUTPATIENT_CLINIC_OR_DEPARTMENT_OTHER): Payer: Self-pay | Admitting: Primary Care

## 2004-02-07 NOTE — Telephone Encounter (Signed)
TC to pt @ home #   Reviewed results with pt. SHe is scheduled to see gyn provider in February.  Advised her to bring menstrual calendar with her to appointment

## 2004-02-28 ENCOUNTER — Encounter (HOSPITAL_BASED_OUTPATIENT_CLINIC_OR_DEPARTMENT_OTHER): Payer: Self-pay | Admitting: Otolaryngology

## 2004-03-13 ENCOUNTER — Ambulatory Visit (HOSPITAL_BASED_OUTPATIENT_CLINIC_OR_DEPARTMENT_OTHER): Payer: Charity | Admitting: Internal Medicine

## 2004-03-13 ENCOUNTER — Ambulatory Visit (HOSPITAL_BASED_OUTPATIENT_CLINIC_OR_DEPARTMENT_OTHER): Payer: Self-pay | Admitting: Internal Medicine

## 2004-03-13 VITALS — BP 100/72 | Temp 99.3°F | Wt 212.0 lb

## 2004-03-13 DIAGNOSIS — Z130 Encounter for screening for diseases of the blood and blood-forming organs and certain disorders involving the immune mechanism: Secondary | ICD-10-CM

## 2004-03-13 DIAGNOSIS — M549 Dorsalgia, unspecified: Secondary | ICD-10-CM

## 2004-03-13 DIAGNOSIS — Z1329 Encounter for screening for other suspected endocrine disorder: Secondary | ICD-10-CM

## 2004-03-13 DIAGNOSIS — Z13228 Encounter for screening for other metabolic disorders: Secondary | ICD-10-CM

## 2004-03-13 DIAGNOSIS — R42 Dizziness and giddiness: Secondary | ICD-10-CM

## 2004-03-13 LAB — CHG LIPID PANEL
Cholesterol: 238 mg/dl — ABNORMAL HIGH (ref 0–200)
HIGH DENSITY LIPOPROTEIN: 43 mg/dl (ref 35–85)
LOW DENSITY LIPOPROTEIN DIRECT: 202 mg/dl (ref 0–100)
RISK FACTOR: 5.5 — ABNORMAL HIGH (ref ?–4.4)
TRIGLYCERIDES: 106 mg/dl (ref 0–150)

## 2004-03-13 LAB — URINALYSIS
BILIRUBIN, URINE: NEGATIVE
CASTS: NONE SEEN PER LPF
CRYSTALS: NONE SEEN
GLUCOSE, URINE: NEGATIVE MG/DL
KETONE, URINE: NEGATIVE MG/DL
LEUKOCYTE ESTERASE: NEGATIVE
NITRITE, URINE: NEGATIVE
PH URINE: 6.5 (ref 5.0–8.0)
PROTEIN, URINE: NEGATIVE MG/DL
SPECIFIC GRAVITY URINE: 1.006 (ref 1.003–1.035)

## 2004-03-13 LAB — URINE PREGNANCY TEST (POINT OF CARE): HCG QUALITATIVE URINE: NEGATIVE

## 2004-03-13 MED ORDER — FLEXERIL 10 MG PO TABS
ORAL_TABLET | ORAL | Status: AC
Start: 2004-03-13 — End: 2004-04-12

## 2004-03-13 NOTE — Progress Notes (Signed)
Alejandra Martin is a 34 year old female here because she has afew questions:   - feels very congested, ahs some sore throat, gets slightly better with claritin but needs to take it every day, ahs ENT appt Monday  - has been feelign lightheadedhed, wonders if she could be pregnant  - has left sided low back pains, for one week, no trauma      PE:  Head and scalp normal. Normal conjunctiva, PERL, EOMI, TMs normal bilaterally, no AFL. No sinus tenderness. MMM, pharynx without erythema or exudate. Neck supple, no lymphadenopathy  back with left sided pains apptox T10 - L1, no CVA tendneress    PLAN:  724.5 BACKACHE NOS (primary encounter diagnosis)  Note: start flexeril for muscle pains, check UA to be sure kidneys are OK  Plan: URINALYSIS, FLEXERIL 10 MG OR TABS       V77.99 SCREENING-ENDOC/NUT/MET/IMMUN NEC  Note: check lipids  Plan: LIPID PANEL, ROUTINE VENIPUNCTURE       HCG negaitve    plan to see ENt Monday, may need stronget sinus meds

## 2004-03-18 ENCOUNTER — Ambulatory Visit (HOSPITAL_BASED_OUTPATIENT_CLINIC_OR_DEPARTMENT_OTHER): Payer: Self-pay | Admitting: Otolaryngology

## 2004-03-22 ENCOUNTER — Encounter: Payer: Self-pay | Admitting: Obstetrics & Gynecology

## 2004-03-25 ENCOUNTER — Telehealth (HOSPITAL_BASED_OUTPATIENT_CLINIC_OR_DEPARTMENT_OTHER): Payer: Self-pay | Admitting: Registered Nurse

## 2004-03-25 DIAGNOSIS — J019 Acute sinusitis, unspecified: Secondary | ICD-10-CM

## 2004-03-25 NOTE — Telephone Encounter (Signed)
Staff Message copied by Barbarann Ehlers on 03/25/2004 at 4:36 PM  ------   Message from: [name], [name]   Created: 03/25/2004 at 3:54 PM   Contact: [phone]    Willow Shidler [id number], 34 year old, female, [phone] (home) [phone] (work)    Cell phone:   Other phone:    Available times:    Person calling on behalf of patient: patient (self)    Calls today to inquire on her medication

## 2004-03-25 NOTE — Telephone Encounter (Signed)
Staff Message copied by Barbarann Ehlers on 03/25/2004 at 10:07 AM  ------   Message from: Loma Cornish   Created: 03/25/2004 at 9:04 AM   Contact: [phone]    Alejandra Martin [id number], 34 year old, female, [phone] (home) [phone] (work)    Cell phone:   Other phone:    Available times:    Person calling on behalf of patient: patient (self)    Calls today with questions and concerns. Patient is looking to speak to provider, please call  Thanks

## 2004-03-25 NOTE — Telephone Encounter (Signed)
[name] To pt. She states that she went to the ENT specialist and he didn't see anything wrong. He states that it just allergies. Pt. states that she has a "bitter taste" in the back of her throat. When she doesn't take antibiotics it feels like her throat swells and she has trouble swallowing. She states that this weekend she had trouble sleeping - claritin is not helping. She took a cephalexin and was able to fall asleep. Wants to know if she can get a stronger claritin and the cephalexin re-ordered. To provider for review.

## 2004-03-26 DIAGNOSIS — T7840XA Allergy, unspecified, initial encounter: Secondary | ICD-10-CM | POA: Insufficient documentation

## 2004-03-26 HISTORY — DX: Allergy, unspecified, initial encounter: T78.40XA

## 2004-03-26 MED ORDER — ALLEGRA 60 MG OR CAPS
ORAL_CAPSULE | ORAL | Status: AC
Start: 2004-03-26 — End: 2004-09-22

## 2004-03-26 MED ORDER — CEPHALEXIN 500 MG PO CAPS
ORAL_CAPSULE | ORAL | Status: DC
Start: 2004-03-25 — End: 2004-04-03

## 2004-03-26 NOTE — Telephone Encounter (Signed)
Will send new rx for abx and change rx to allegra  Non-formulary request faxed to [facility]

## 2004-04-03 ENCOUNTER — Ambulatory Visit (HOSPITAL_BASED_OUTPATIENT_CLINIC_OR_DEPARTMENT_OTHER): Payer: Self-pay

## 2004-04-03 ENCOUNTER — Ambulatory Visit (HOSPITAL_BASED_OUTPATIENT_CLINIC_OR_DEPARTMENT_OTHER): Payer: Charity | Admitting: Internal Medicine

## 2004-04-03 VITALS — BP 102/80 | Temp 98.9°F | Wt 209.0 lb

## 2004-04-03 DIAGNOSIS — R300 Dysuria: Secondary | ICD-10-CM

## 2004-04-03 DIAGNOSIS — N979 Female infertility, unspecified: Secondary | ICD-10-CM

## 2004-04-03 DIAGNOSIS — Z23 Encounter for immunization: Secondary | ICD-10-CM

## 2004-04-03 NOTE — Progress Notes (Signed)
Medications, allergies and problem list reviewed.    Alejandra Martin is a 34 year old female her with a few questiosn:   - has been having LLq pains, feels like when she had her cyst, worried about having a Cyst  - she is considering GYN referral, was told by endocrine that she doesn't ovulate enough  - needs repeat UA, The patient denies dysuria, frequency or hematuria.    PE:  Abd with slight LLQ pains    PLAN:  628.9 FEMALE INFERTILITY NOS (primary encounter diagnosis)  Note: refer to GYN for infertility, ? Need for clomid  Plan: REFERRAL TO GYNECOLOGY (INT)   Check pelvic US for pains    788.1 DYSURIA  Note: check UA  Plan: URINALYSIS, URINE CULTURE/COLONY COUNT       V06.5 VACCINE FOR TETANUS/DIPHTERIA  Note: given left deltoid without problems    Plan: TD VACCINE > 7, IM, IMMUNIZATION ADMIN

## 2004-04-09 ENCOUNTER — Telehealth (HOSPITAL_BASED_OUTPATIENT_CLINIC_OR_DEPARTMENT_OTHER): Payer: Self-pay | Admitting: Registered Nurse

## 2004-04-09 DIAGNOSIS — R5381 Other malaise: Secondary | ICD-10-CM

## 2004-04-09 DIAGNOSIS — R5383 Other fatigue: Principal | ICD-10-CM

## 2004-04-09 NOTE — Telephone Encounter (Signed)
[name] to pt. She wants to know if Dr. Domenica Reamer would add on a blood test for mono. She is feeling very tired and wants to be checked. She also wants to repeat her urine test as per the letter she was sent. To provider for review.

## 2004-04-09 NOTE — Telephone Encounter (Signed)
Please call and let her know i added on teh mono; I also left the orders for teh urine in the computer so she can call for a lAB ONLY appt  thanks

## 2004-04-09 NOTE — Telephone Encounter (Signed)
Staff Message copied by Barbarann Ehlers on 04/09/2004 at 10:26 AM  ------   Message from: [name], [name]   Created: 04/09/2004 at 9:37 AM   Contact: [phone]    Alejandra Martin [id number], 34 year old, female, [phone] (home) [phone] (work)    Cell phone:   Other phone:    Available times:    Person calling on behalf of patient: patient (self)    Calls today with questions and concerns.

## 2004-04-10 NOTE — Telephone Encounter (Signed)
Pt notified and will be in tomorrow for a lab only.

## 2004-04-11 ENCOUNTER — Encounter (HOSPITAL_BASED_OUTPATIENT_CLINIC_OR_DEPARTMENT_OTHER): Payer: Charity | Admitting: Lab

## 2004-04-11 LAB — CHG HETEROPHILE ANTIBODIES SCREEN: HETEROPHILE ANTIBODY: NEGATIVE

## 2004-04-11 LAB — MUMPS IGG ANTIBODY: MUMPS IGG ANTIBODY: 1 — ABNORMAL HIGH (ref 0.0–0.8)

## 2004-04-16 ENCOUNTER — Encounter (HOSPITAL_BASED_OUTPATIENT_CLINIC_OR_DEPARTMENT_OTHER): Payer: Self-pay

## 2004-04-16 ENCOUNTER — Other Ambulatory Visit (HOSPITAL_BASED_OUTPATIENT_CLINIC_OR_DEPARTMENT_OTHER): Payer: Self-pay | Admitting: Registered Nurse

## 2004-04-16 ENCOUNTER — Encounter (HOSPITAL_BASED_OUTPATIENT_CLINIC_OR_DEPARTMENT_OTHER): Payer: Charity | Admitting: Lab

## 2004-04-16 NOTE — Telephone Encounter (Signed)
[name] to pt. No answer. No voice mail. Await call back.

## 2004-04-16 NOTE — Telephone Encounter (Signed)
Staff Message copied by Barbarann Ehlers on 04/16/2004 at 9:52 AM  ------   Message from: Asa Saunas   Created: 04/15/2004 at 4:42 PM   Regarding: medication is making her worst    Contact: [phone]    Alejandra Martin [id number], 34 year old, female, [phone] (home) [phone] (work)    Cell phone:   Other phone:    Available times:    Patient's language of care: English    Patient does not need an interpreter.    Person calling on behalf of patient: patient (self)    Calls today

## 2004-04-18 NOTE — Telephone Encounter (Signed)
[name] to pt. Allergra is not working. 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 She has a "terrible taste" in her mouth and her ears are bothering her. ? If she is allergic to dust. She is worried that there is something more serious wrong because she can't get the taste out of her mouth. If someone walks by with perfume on then that is all she can taste for the rest of the day. To provider for review.

## 2004-04-18 NOTE — Telephone Encounter (Signed)
Staff Message copied by Barbarann Ehlers on 04/18/2004 at 11:46 AM  ------   Message from: Loma [place]   Created: 04/18/2004 at 9:18 AM   Contact: [phone]    Chaney Ingram [id number], 34 year old, female, [phone] (home) [phone] (work)    Patient's language of care: English    Patient does not need an interpreter.    Person calling on behalf of patient: patient (self)    Calls today with questions and concerns. Patient is looking to schedule an appt with Dr. Domenica Reamer before 04/23/04, please call    Thanks

## 2004-04-19 ENCOUNTER — Ambulatory Visit (HOSPITAL_BASED_OUTPATIENT_CLINIC_OR_DEPARTMENT_OTHER): Payer: Charity | Admitting: Internal Medicine

## 2004-04-19 VITALS — BP 120/80 | HR 70 | Temp 97.1°F | Resp 14 | Ht 63.0 in | Wt 204.0 lb

## 2004-04-19 DIAGNOSIS — T7840XA Allergy, unspecified, initial encounter: Secondary | ICD-10-CM

## 2004-04-19 DIAGNOSIS — J029 Acute pharyngitis, unspecified: Secondary | ICD-10-CM

## 2004-04-19 MED ORDER — PROTONIX 40 MG PO TBEC
DELAYED_RELEASE_TABLET | ORAL | Status: DC
Start: 2004-04-19 — End: 2004-05-13

## 2004-04-19 MED ORDER — NYSTATIN 100000 UNIT/ML MT SUSP
OROMUCOSAL | Status: AC
Start: 2004-04-19 — End: 2004-05-19

## 2004-04-19 NOTE — Telephone Encounter (Signed)
Appointment made for today

## 2004-04-19 NOTE — Telephone Encounter (Signed)
Ok to double book

## 2004-04-19 NOTE — Telephone Encounter (Signed)
[name] from pt. She states that last night she felt that she had a smoke taste coming out of her mouth. She wants to be seen today. She feels that she is wheezing when she takes a deep breath. She wants to be seen today after 3:30 as she is in work until then. Can be here by 3:45.

## 2004-04-19 NOTE — Progress Notes (Signed)
Alejandra Martin is a 34 year old female here with continued taste in her mouth, feels the back of her throat is on fire, that she tastes smoke, that she tastes perfumes when people are nearby with strong perfumes, she has slight improvement with allegra but only when she takes two multiple times a day, she doesn't have any pain in her stomach or reflux, has white stuff on the abck of her tongue that started before the antibiotics.     PE:  No sinus tenderness  Ears normal  MMM, no lesions on cheecks, throat with very minimal erythema, some post nasal drip  Back of tongue with some adherent white   Left back side of tongue with ulceration    PLAN:  995.3 ALLERGY, UNSPECIFIED  Note: contineu allegra for postnasal drip  Plan:     462 ACUTE PHARYNGITIS  Note: check cx to see fit ehre is infection, burning may also be from reflux, start PPIs, elevate HOB  Plan: PROTONIX 40 MG OR TBEC, NYSTATIN 100000 UNIT/ML   MT SUSP, THROAT CULTURE, BETA STREP   Trial nystatin for ? Thrush    RTc if not improving

## 2004-04-21 LAB — THROAT CULTURE BETA STREP

## 2004-04-22 ENCOUNTER — Other Ambulatory Visit (HOSPITAL_BASED_OUTPATIENT_CLINIC_OR_DEPARTMENT_OTHER): Payer: Charity | Admitting: Lab

## 2004-04-22 DIAGNOSIS — R300 Dysuria: Secondary | ICD-10-CM

## 2004-04-22 LAB — URINALYSIS
BILIRUBIN, URINE: NEGATIVE
GLUCOSE, URINE: NEGATIVE MG/DL
KETONE, URINE: NEGATIVE MG/DL
LEUKOCYTE ESTERASE: NEGATIVE
NITRITE, URINE: NEGATIVE
OCCULT BLOOD, URINE: NEGATIVE
PH URINE: 5.5 (ref 5.0–8.0)
PROTEIN, URINE: NEGATIVE MG/DL
SPECIFIC GRAVITY URINE: 1.027 (ref 1.003–1.035)

## 2004-04-22 NOTE — Nursing Note (Signed)
>>   Martin, Alejandra     04/22/2004   6:58 pm  Lab completed  lr

## 2004-04-23 LAB — URINE CULTURE/COLONY COUNT

## 2004-04-24 ENCOUNTER — Telehealth (HOSPITAL_BASED_OUTPATIENT_CLINIC_OR_DEPARTMENT_OTHER): Payer: Self-pay | Admitting: Registered Nurse

## 2004-04-24 NOTE — Telephone Encounter (Signed)
Staff Message copied by Barbarann Ehlers on 04/24/2004 at 9:02 AM  ------   Message from: [name], [name]   Created: 04/24/2004 at 8:30 AM   Contact: [phone]    Alejandra Martin [id number], 34 year old, female, [phone] (home) [phone] (work)    CALL BACK NUMBER:[phone]  Cell phone:   Other phone:    Available times:    Patient's language of care: English    Patient does not need an interpreter.    Person calling on behalf of patient: patient (self)    Calls today with questions and concerns.

## 2004-04-24 NOTE — Telephone Encounter (Signed)
[name] to pt. She states that she didn't understand the letter that was sent. I explained that her culture was negative. She is still concerned because the perfume that people wear at work "tastes like it's on my tongue". I stated that she may have a hypersensitivity to perfume and that she should probably talk to her supervisor or see if she can change her location. She agreed.

## 2004-05-03 ENCOUNTER — Ambulatory Visit (HOSPITAL_BASED_OUTPATIENT_CLINIC_OR_DEPARTMENT_OTHER): Payer: Self-pay

## 2004-05-09 ENCOUNTER — Encounter (HOSPITAL_BASED_OUTPATIENT_CLINIC_OR_DEPARTMENT_OTHER): Payer: Self-pay | Admitting: Obstetrics & Gynecology

## 2004-05-13 ENCOUNTER — Telehealth (HOSPITAL_BASED_OUTPATIENT_CLINIC_OR_DEPARTMENT_OTHER): Payer: Self-pay | Admitting: Registered Nurse

## 2004-05-13 DIAGNOSIS — J029 Acute pharyngitis, unspecified: Secondary | ICD-10-CM

## 2004-05-13 DIAGNOSIS — K219 Gastro-esophageal reflux disease without esophagitis: Secondary | ICD-10-CM

## 2004-05-13 MED ORDER — PROTONIX 40 MG PO TBEC
DELAYED_RELEASE_TABLET | ORAL | Status: DC
Start: 2004-05-13 — End: 2004-07-10

## 2004-05-13 NOTE — Telephone Encounter (Signed)
She can take the protonix twice a day for 2 weeks and see if it helps her feel better.   Does she need a new rx? If so, please call into the pharmacy of ehr choice

## 2004-05-13 NOTE — Telephone Encounter (Signed)
Staff Message copied by Barbarann Ehlers on 05/13/2004 at 11:25 AM  ------   Message from: [name], [name]   Created: 05/10/2004 at 11:03 AM   Contact: [phone]    Alejandra Martin [id number], 34 year old, female, [phone] (home) [phone] (work)    [name] NUMBER: [phone]  Cell phone:   Other phone:    Available times:    Patient's language of care: English    Patient does not need an interpreter.    Person calling on behalf of patient: patient (self)    Calls today with questions and concerns about her medications.

## 2004-05-13 NOTE — Telephone Encounter (Signed)
[name] to pt. She took a home pregnancy test this morning which was negative. She wants to know if she should finish all the meds that were prescribed. She is better - the bad taste in her mouth is gone but she is having some difficulty swallowing. Feels a tightness in her neck. She wants to know if she should be on something stronger.

## 2004-05-14 NOTE — Telephone Encounter (Signed)
[name] To pt. Not at home. Left a message that I would call her tomorrow.

## 2004-05-14 NOTE — Telephone Encounter (Signed)
Please let her know that the protonix is probably safe, but it would be better to switch to a different allergy medication if she gets pregnant (benadryl). It is always best to take as few medications during pregnancy. If she finds out she is pregnant, she should make an appt and we can review all of her medications.

## 2004-05-14 NOTE — Telephone Encounter (Signed)
Pt wants to know if she can stay on protonix and Allegra if she becomes pregnant.

## 2004-05-17 NOTE — Telephone Encounter (Signed)
[name] to pt and advised her on medications as per Dr. Domenica Reamer.

## 2004-05-21 ENCOUNTER — Ambulatory Visit (HOSPITAL_BASED_OUTPATIENT_CLINIC_OR_DEPARTMENT_OTHER): Payer: Self-pay | Admitting: Occupational Medicine

## 2004-05-22 ENCOUNTER — Telehealth (HOSPITAL_BASED_OUTPATIENT_CLINIC_OR_DEPARTMENT_OTHER): Payer: Self-pay | Admitting: Registered Nurse

## 2004-05-22 NOTE — Telephone Encounter (Signed)
Staff Message copied by Barbarann Ehlers on 05/22/2004 at 11:54 AM  ------   Message from: Arvilla Meres   Created: 05/22/2004 at 11:24 AM    Elray Mcgregor [id number], 34 year old, female, [phone] (home) [phone] (work)    [name] NUMBER: [phone]  Cell phone:   Other phone:    Available times: before 3 pm    Patient's language of care: English    Patient does not need an interpreter.    Person calling on behalf of patient: patient (self)    Calls today with questions and concerns.  Has a question on medication she has at home

## 2004-05-22 NOTE — Telephone Encounter (Signed)
[name] to pt. She states that her work sent her to occupational health and a doctor there stated that she needed to be on steroids. He was going to e-mail Dr. Domenica Reamer from [facility] with his recommendations.

## 2004-05-22 NOTE — Telephone Encounter (Addendum)
Please ask [facility] to fax [name] Dr. Jorene Minors' note.

## 2004-06-04 ENCOUNTER — Telehealth (HOSPITAL_BASED_OUTPATIENT_CLINIC_OR_DEPARTMENT_OTHER): Payer: Self-pay

## 2004-06-04 MED ORDER — FLONASE 50 MCG/DOSE NA INHA
NASAL | Status: AC
Start: 2004-06-04 — End: 2004-11-04

## 2004-06-04 NOTE — Telephone Encounter (Signed)
Tc who states that she has been taking medication for acid reflux and she could not eat anything all week. But today is a little better.    Pt also c/o constiptaion and would lik eto get something OTC for it as she does not have [facility] anymore     Pain between her breast very very sore    Pt has been taking the medication for two momnths no relief.

## 2004-06-04 NOTE — Telephone Encounter (Signed)
Staff Message copied by Odis Luster on 06/04/2004 at 11:51 AM  ------   Message from: [name]   Created: 06/04/2004 at 9:43 AM   Regarding: Sick Call   Contact: [phone]    Taler Kushner [id number], 34 year old, female, [phone] (home) [phone] (work)    [name] NUMBER: [phone]  Cell phone:   Other phone:    Available times:    Patient's language of care: English    Patient does not need an interpreter.    Person calling on behalf of patient: patient (self)    Calls today with a sick call.Please call.  Thanks-mlo

## 2004-06-04 NOTE — Telephone Encounter (Signed)
For her her constipation she can try Milk of magnesia OTC  Also, Dr. Jorene Minors recommended a nasal steroid for her post nasal drip. Please call to the pharmacy - thanks!

## 2004-06-05 ENCOUNTER — Encounter (HOSPITAL_BASED_OUTPATIENT_CLINIC_OR_DEPARTMENT_OTHER): Payer: Charity

## 2004-06-05 NOTE — Telephone Encounter (Signed)
Tc to pt and i explained the directions to pt regarding the milk of magnesia, that she should follo wto directions on the bottle. Pt wanted to know if this should help with her acid reflux. I explained to pt that she may have a lot of stool in her bowel and with taking the Milk of magnesia that might ease her constiptaion. P states that she is already taking flonase for her post nasal drip. I explained that if she was not feeling any better after a few days to contact clinic. Pt agreed.

## 2004-06-05 NOTE — Telephone Encounter (Signed)
Staff Message copied by Odis Luster on 06/05/2004 at 2:45 PM  ------   Message from: Loma Murillo   Created: 06/05/2004 at 1:56 PM   Contact: [phone]    Delmar Dondero [id number], 34 year old, female, [phone] (home) [phone] (work)    CALL BACK NUMBER:   Cell phone:   Other phone:    Available times:    Patient's language of care: English    Patient does not need an interpreter.    Person calling on behalf of patient: patient (self)    Calls today with questions and concerns. Patient is waiting for a call back from a nurse she is not feeling weel and is looking to be seen, please call    Thanks

## 2004-06-07 ENCOUNTER — Telehealth (HOSPITAL_BASED_OUTPATIENT_CLINIC_OR_DEPARTMENT_OTHER): Payer: Self-pay | Admitting: Registered Nurse

## 2004-06-07 ENCOUNTER — Emergency Department (HOSPITAL_BASED_OUTPATIENT_CLINIC_OR_DEPARTMENT_OTHER): Payer: Self-pay | Admitting: Emergency Medicine

## 2004-06-07 NOTE — Telephone Encounter (Signed)
Staff Message copied by Barbarann Ehlers on 06/07/2004 at 2:40 PM  ------   Message from: Rozann Lesches   Created: 06/07/2004 at 12:31 PM   Regarding: SICK CALL   Contact: [phone]    Leonda Cristo [id number], 34 year old, female, [phone] (home) [phone] (work)    CALL BACK NUMBER:   Cell phone:   Other phone:    Available times:    Patient's language of care: English    Patient does not need an interpreter.    Person calling on behalf of patient: patient (self)    Calls today with questions and concerns.  PT. IS TAKING MILK OF MAGICIAN BUT ITS MAKING THINGS WORSE. PLEASE CALL PT. BACK

## 2004-06-10 ENCOUNTER — Ambulatory Visit (HOSPITAL_BASED_OUTPATIENT_CLINIC_OR_DEPARTMENT_OTHER): Payer: Charity | Admitting: Internal Medicine

## 2004-06-10 ENCOUNTER — Encounter (HOSPITAL_BASED_OUTPATIENT_CLINIC_OR_DEPARTMENT_OTHER): Payer: Charity | Admitting: Registered Nurse

## 2004-06-10 VITALS — BP 120/80 | Temp 97.3°F | Resp 16 | Wt 199.0 lb

## 2004-06-10 DIAGNOSIS — R1013 Epigastric pain: Secondary | ICD-10-CM

## 2004-06-10 LAB — CHG HEPATIC FUNCTION PANEL
ALANINE AMINOTRANSFERASE: 17 IU/L (ref 7–35)
ALBUMIN: 4.4 g/dl (ref 3.4–4.8)
ALKALINE PHOSPHATASE: 104 IU/L (ref 25–106)
ASPARTATE AMINOTRANSFERASE: 22 IU/L (ref 8–34)
BILIRUBIN DIRECT: 0.1 mg/dl (ref 0.0–0.2)
BILIRUBIN TOTAL: 0.5 mg/dl (ref 0.2–1.1)
TOTAL PROTEIN: 7.6 g/dl — ABNORMAL HIGH (ref 5.9–7.5)

## 2004-06-10 NOTE — Telephone Encounter (Signed)
Pt was in the ER 06/07/04. Here for a w/i - follow up from the ER. To see Dr. Domenica Reamer today.

## 2004-06-11 ENCOUNTER — Other Ambulatory Visit: Payer: Self-pay | Admitting: Internal Medicine

## 2004-06-11 DIAGNOSIS — R1011 Right upper quadrant pain: Secondary | ICD-10-CM

## 2004-06-11 LAB — US ABDOMEN COMPLETE

## 2004-06-11 LAB — XR UPPER GI SERIES WITH AIR CONTRAST & KUB

## 2004-06-11 NOTE — Progress Notes (Signed)
Alejandra Martin is a 34 year old female here for ER followup, she was sena t [facility] for epigastric pains, was told that it was gastritis, then went [facility] for [facility] and had normal CBC, h pylori pending. They recommmended that she have a RUQ [name] to look for gallstones. The feeling in her throat has improved with the protonix, but now she has this new pain, comes and goes, not related to food or position, in epigatric and on left side of abd. Feels better with maalox, BMs normal, no blood, maybe a little yellow.     PE:  pt appears well, in good mood  S1 and S2 normal, no murmurs, clicks, gallops or rubs. Regular rate and rhythm. Patient is breathing easily. Chest is clear; no wheezes or rales.  The abdomen is soft with epigastric tenderness and slight left sided abd tendneress, no guarding, mass, rebound or organomegaly.    PLAN:  789.06 ABDOMINAL PAIN EPIGASTRIC (primary encounter diagnosis)  Note: check lFts today to look for biliary dz, also check RUQ [name] and upper Gi to eval for reflux or ulcers that may be causeing her pain  Plan: HEPATIC FUNCTION PANEL, ROUTINE VENIPUNCTURE   Can try prilosec OTC 20 mg 2 tabs in AM for any improvemnt

## 2004-06-12 ENCOUNTER — Telehealth (HOSPITAL_BASED_OUTPATIENT_CLINIC_OR_DEPARTMENT_OTHER): Payer: Self-pay | Admitting: Registered Nurse

## 2004-06-12 DIAGNOSIS — K2900 Acute gastritis without bleeding: Secondary | ICD-10-CM | POA: Insufficient documentation

## 2004-06-12 HISTORY — DX: Acute gastritis without bleeding: K29.00

## 2004-06-12 MED ORDER — AMOXICILLIN 500 MG PO TABS
ORAL_TABLET | ORAL | Status: DC
Start: 2004-06-12 — End: 2004-06-21

## 2004-06-12 MED ORDER — CLARITHROMYCIN 500 MG PO TABS
ORAL_TABLET | ORAL | Status: DC
Start: 2004-06-12 — End: 2004-06-21

## 2004-06-12 MED ORDER — PRILOSEC 40 MG PO CPDR
DELAYED_RELEASE_CAPSULE | ORAL | Status: DC
Start: 2004-06-12 — End: 2004-07-10

## 2004-06-12 NOTE — Telephone Encounter (Signed)
I spoke with Alejandra Martin about her normal lab test resutls, she agrees to start tx for h pylori; if she doesn't have free care willc all for hard copies of her rx so she can buy them at the pharmacy

## 2004-06-12 NOTE — Telephone Encounter (Signed)
Pt calling for test results. Letter was sent that liver function was normal. She states that she called the ER at [facility] in [place] and they told her that she is H-pylori positive. Alejandra Martin stated that we could call the nurse [phone] to confirm the results. She also wanted the results of her UGI but a letter has not gone out regarding this. Pt wanted to know what medications she would be started on for the H-pylori. To provider for review.

## 2004-06-12 NOTE — Telephone Encounter (Signed)
Staff Message copied by Barbarann Ehlers on 06/12/2004 at 9:54 AM  ------   Message from: [name], [name]   Created: 06/12/2004 at 8:34 AM   Contact: [phone]    Alejandra Martin [id number], 34 year old, female, [phone] (home) [phone] (work)    [name] NUMBER [phone]  Cell phone:   Other phone:    Available times:    Patient's language of care: English    Patient does not need an interpreter.    Person calling on behalf of patient: patient (self)    Calls today for test result(s).

## 2004-06-13 ENCOUNTER — Ambulatory Visit (HOSPITAL_BASED_OUTPATIENT_CLINIC_OR_DEPARTMENT_OTHER): Payer: Self-pay | Admitting: Internal Medicine

## 2004-06-13 ENCOUNTER — Telehealth (HOSPITAL_BASED_OUTPATIENT_CLINIC_OR_DEPARTMENT_OTHER): Payer: Self-pay | Admitting: Registered Nurse

## 2004-06-13 DIAGNOSIS — K2900 Acute gastritis without bleeding: Secondary | ICD-10-CM

## 2004-06-13 NOTE — Telephone Encounter (Signed)
Please let her know that for 2 weeks she should just take the prilosec twice a day, then after the treatment if she still has abdominal pain she should resume the protonix

## 2004-06-13 NOTE — Telephone Encounter (Signed)
Staff Message copied by Barbarann Ehlers on 06/13/2004 at 3:46 PM  ------   Message from: Rozann Lesches   Created: 06/13/2004 at 11:17 AM   Contact: [phone]    Aruna Nestler [id number], 34 year old, female, [phone] (home) [phone] (work)    CALL BACK NUMBER:   Cell phone:   Other phone:    Available times:    Patient's language of care: English    Patient does not need an interpreter.    Person calling on behalf of patient: patient (self)    Calls today with questions ON HER MEDICATION

## 2004-06-13 NOTE — Telephone Encounter (Signed)
Pt wants to know if she needs to take the Protonix as well as the Prilosec.

## 2004-06-14 MED ORDER — PROTONIX 40 MG PO TBEC
DELAYED_RELEASE_TABLET | ORAL | Status: DC
Start: 2004-06-14 — End: 2004-06-28

## 2004-06-14 NOTE — Telephone Encounter (Signed)
Please let her know that free care only overs protonix, so she should take protonix 40 twice a day while she is treating teh h. Pylori and then can continue with once a day after the treatment is over. I sent in an additional rx to [facility]

## 2004-06-14 NOTE — Telephone Encounter (Signed)
[name] to pt. She needs a refill on the prilosec because she thought she had enough at home (she purchased them) but she doesn't have enough.

## 2004-06-17 NOTE — Telephone Encounter (Signed)
Attempted to reach pt at home and work- Not available. Left a message at there home # for a call back.

## 2004-06-17 NOTE — Telephone Encounter (Signed)
Staff Message copied by Barbarann Ehlers on 06/17/2004 at 4:54 PM  ------   Message from: Asa Saunas   Created: 06/17/2004 at 10:36 AM   Regarding: which prescription she should take   Contact: [phone]    Alejandra Martin [id number], 34 year old, female, [phone] (home) [phone] (work)    Cell phone:   Other phone:    Available times:    Patient's language of care: English    Patient does not need an interpreter.    Person calling on behalf of patient: patient (self)    Calls today pt is taking protonics already, which one she should take

## 2004-06-18 ENCOUNTER — Telehealth (HOSPITAL_BASED_OUTPATIENT_CLINIC_OR_DEPARTMENT_OTHER): Payer: Self-pay

## 2004-06-18 DIAGNOSIS — A048 Other specified bacterial intestinal infections: Secondary | ICD-10-CM

## 2004-06-18 NOTE — Telephone Encounter (Signed)
Staff Message copied by Odis Luster on 06/18/2004 at 4:12 PM  ------   Message from: [name], [name]   Created: 06/18/2004 at 9:12 AM   Contact: [phone]    Pollyann Roa [id number], 34 year old, female, [phone] (home) [phone] (work)    [name] NUMBER: [phone]  Cell phone:   Other phone:    Available times:    Patient's language of care: English    Patient does not need an interpreter.    Person calling on behalf of patient: patient (self)    Calls today with questions and concerns.

## 2004-06-18 NOTE — Telephone Encounter (Signed)
Reached pt and advised her of the medication to take.

## 2004-06-20 ENCOUNTER — Other Ambulatory Visit (HOSPITAL_BASED_OUTPATIENT_CLINIC_OR_DEPARTMENT_OTHER): Payer: Self-pay | Admitting: Internal Medicine

## 2004-06-20 DIAGNOSIS — R1032 Left lower quadrant pain: Secondary | ICD-10-CM

## 2004-06-20 LAB — US TRANSVAGINAL NON-OB

## 2004-06-20 LAB — US PELVIC NON-PREGNANT

## 2004-06-20 NOTE — Telephone Encounter (Signed)
Pt states that she has decreased appetite and doesn't feel that she is herself. Can't sleep and feels short of breath. She states that she thinks that it is the clarithromycin because she didn't take it last night it this morning. Wants to know if there is an alternative.

## 2004-06-20 NOTE — Telephone Encounter (Signed)
Staff Message copied by Barbarann Ehlers on 06/20/2004 at 12:49 PM  ------   Message from: [name]   Created: 06/19/2004 at 1:09 PM   Regarding: Patient question   Contact: [phone]    Alejandra Martin [id number], 34 year old, female, [phone] (home) [phone] (work)    [name] NUMBER: [phone]  Cell phone:   Other phone:    Available times:    Patient's language of care: English    Patient does not need an interpreter.    Person calling on behalf of patient: patient (self)    Calls today to speak to provider only.Patient would like to speak with Dr.Obbard-She has a question regarding her medications.  Please call.  Thanks-mlo

## 2004-06-21 ENCOUNTER — Ambulatory Visit (HOSPITAL_BASED_OUTPATIENT_CLINIC_OR_DEPARTMENT_OTHER): Payer: Self-pay | Admitting: Internal Medicine

## 2004-06-21 MED ORDER — TETRACYCLINE HCL 500 MG PO CAPS
ORAL_CAPSULE | ORAL | Status: DC
Start: 2004-06-21 — End: 2004-06-28

## 2004-06-21 MED ORDER — BISMUTH 262 MG PO CHEW
CHEWABLE_TABLET | ORAL | Status: DC
Start: 2004-06-21 — End: 2004-06-28

## 2004-06-21 MED ORDER — METRONIDAZOLE 500 MG PO TABS
ORAL_TABLET | ORAL | Status: DC
Start: 2004-06-21 — End: 2004-06-28

## 2004-06-21 NOTE — Telephone Encounter (Signed)
If she is nauseous form clarithromycin, can switch to 14 days of bismuth, tetracycline, metronidazole and protonix

## 2004-06-21 NOTE — Telephone Encounter (Signed)
[name] to pt and advised her of above medication change. In addition, per Dr. Domenica Reamer, she is to take protonix twice a day. She agreed.

## 2004-06-21 NOTE — Telephone Encounter (Signed)
Staff Message copied by Barbarann Ehlers on 06/21/2004 at 10:59 AM  ------   Message from: [name]   Created: 06/21/2004 at 10:44 AM   Regarding: Patient calling back   Contact: [phone]    Alejandra Martin [id number], 34 year old, female, [phone] (home) [phone] (work)    [name] NUMBER: [phone]  Cell phone:   Other phone:    Available times:    Patient's language of care: English    Patient does not need an interpreter.    Person calling on behalf of patient: patient (self)    Calls today with questions and concerns.Patient would like to speak with the nurse.  Thanks-mlo

## 2004-06-24 ENCOUNTER — Telehealth (HOSPITAL_BASED_OUTPATIENT_CLINIC_OR_DEPARTMENT_OTHER): Payer: Self-pay | Admitting: Registered Nurse

## 2004-06-24 DIAGNOSIS — R1032 Left lower quadrant pain: Secondary | ICD-10-CM

## 2004-06-24 DIAGNOSIS — K2900 Acute gastritis without bleeding: Secondary | ICD-10-CM

## 2004-06-24 NOTE — Telephone Encounter (Signed)
Has decreased appetite on the new medication, still with some abdominal discomfort, feels pain in her rectum, irregualr BMs, sometimes yellowish, sometimes cramping, all started since she started the medications but she is very worried about ehr colon and that she ahs cancer. Can't sleep or eat due to concerns  - stop h. Pylori medications  - continue protonix  - check CT scan of abdomen  - refer to Gi for ? EGD and/or colonoscopy

## 2004-06-24 NOTE — Telephone Encounter (Signed)
[name] To pt. She went to the [facility] ER yesterday for anal discomfort, abdominal discomfort, and no appetite. He suggested that she have a colonoscopy and an endoscopy. He did not see anything obvious. She was reading the side effect of the medication and feels it is related to the meds for the H-pylori. She also states that she didn't get the bismuth script. "I'm so stressed out I'm not sleeping. I feel that something is seriously wrong".

## 2004-06-24 NOTE — Telephone Encounter (Signed)
Staff Message copied by Barbarann Ehlers on 06/24/2004 at 12:32 PM  ------   Message from: Rozann Lesches   Created: 06/21/2004 at 3:16 PM   Contact: [phone]    Bellami Farrelly [id number], 34 year old, female, [phone] (home) [phone] (work)    CALL BACK NUMBER:   Cell phone:   Other phone:    Available times:    Patient's language of care: English    Patient does not need an interpreter.    Person calling on behalf of patient: patient (self)    Calls today with questions and concerns.

## 2004-06-26 ENCOUNTER — Encounter (HOSPITAL_BASED_OUTPATIENT_CLINIC_OR_DEPARTMENT_OTHER): Payer: Charity

## 2004-06-28 ENCOUNTER — Other Ambulatory Visit (HOSPITAL_BASED_OUTPATIENT_CLINIC_OR_DEPARTMENT_OTHER): Payer: Charity | Admitting: Lab

## 2004-06-28 ENCOUNTER — Telehealth (HOSPITAL_BASED_OUTPATIENT_CLINIC_OR_DEPARTMENT_OTHER): Payer: Self-pay | Admitting: Internal Medicine

## 2004-06-28 DIAGNOSIS — R5383 Other fatigue: Principal | ICD-10-CM

## 2004-06-28 DIAGNOSIS — R5381 Other malaise: Secondary | ICD-10-CM

## 2004-06-28 DIAGNOSIS — A048 Other specified bacterial intestinal infections: Secondary | ICD-10-CM

## 2004-06-28 LAB — CT PELVIS W CONTRAST

## 2004-06-28 LAB — BLOOD COUNT COMPLETE AUTO&AUTO DIFRNTL WBC
BASOPHIL %: 0.6 % (ref 0–2)
EOSINOPHIL %: 0.5 % (ref 0–7)
HEMATOCRIT: 36.1 % — ABNORMAL LOW (ref 37.0–47.0)
HEMOGLOBIN: 12.1 g/dL (ref 12.0–16.0)
LYMPHOCYTE %: 24.4 % (ref 12–39)
MEAN CORP HGB CONC: 33.4 g/dL (ref 32.0–36.0)
MEAN CORPUSCULAR HGB: 31.2 pg — ABNORMAL HIGH (ref 27.0–31.0)
MEAN CORPUSCULAR VOL: 93.6 fL (ref 81.0–99.0)
MEAN PLATELET VOLUME: 8.5 fL (ref 6.4–10.8)
MONOCYTE %: 6.4 % (ref 1–12)
NEUTROPHIL %: 68.1 % (ref 46–79)
PLATELET COUNT: 265 10*3/uL (ref 150–400)
RBC DISTRIBUTION WIDTH: 12.9 % (ref 11.5–14.3)
RED BLOOD CELL COUNT: 3.86 MIL/uL — ABNORMAL LOW (ref 4.20–5.40)
WHITE BLOOD CELL COUNT: 10 10*3/uL (ref 4.8–10.8)

## 2004-06-28 LAB — COMPREHENSIVE METABOLIC PANEL
ALANINE AMINOTRANSFERASE: 24 IU/L (ref 7–35)
ALBUMIN: 3.8 g/dl (ref 3.4–4.8)
ALKALINE PHOSPHATASE: 74 IU/L (ref 25–106)
ASPARTATE AMINOTRANSFERASE: 34 IU/L (ref 8–34)
BILIRUBIN TOTAL: 0.3 mg/dl (ref 0.2–1.1)
BUN (UREA NITROGEN): 9 mg/dl (ref 6–20)
CALCIUM: 9.5 mg/dl (ref 8.6–10.0)
CARBON DIOXIDE: 28 mmol/L (ref 22–32)
CHLORIDE: 109 mmol/L (ref 101–111)
CREATININE: 0.8 mg/dl (ref 0.4–1.2)
Glucose Random: 85 mg/dl (ref 74–160)
POTASSIUM: 3.7 mmol/L (ref 3.5–5.1)
SODIUM: 141 mmol/L (ref 135–144)
TOTAL PROTEIN: 6.3 g/dl (ref 5.9–7.5)

## 2004-06-28 LAB — CT ABDOMEN W IV CONTRAST

## 2004-06-28 LAB — TSH (THYROID STIMULATING HORMONE): TSH (THYROID STIM HORMONE): 1.41 u[IU]/mL (ref 0.34–5.60)

## 2004-06-28 MED ORDER — TETRACYCLINE HCL 500 MG PO CAPS
ORAL_CAPSULE | ORAL | Status: DC
Start: 2004-06-28 — End: 2004-07-10

## 2004-06-28 MED ORDER — PROTONIX 40 MG PO TBEC
DELAYED_RELEASE_TABLET | ORAL | Status: AC
Start: 2004-06-28 — End: 2004-08-27

## 2004-06-28 MED ORDER — KLONOPIN 0.5 MG PO TABS
ORAL_TABLET | ORAL | Status: DC
Start: 2004-06-28 — End: 2004-09-02

## 2004-06-28 MED ORDER — METRONIDAZOLE 500 MG PO TABS
ORAL_TABLET | ORAL | Status: DC
Start: 2004-06-28 — End: 2004-07-10

## 2004-06-28 MED ORDER — BISMUTH 262 MG PO CHEW
CHEWABLE_TABLET | ORAL | Status: DC
Start: 2004-06-28 — End: 2004-07-10

## 2004-06-28 NOTE — Telephone Encounter (Signed)
I spoke with Alejandra Martin, she is still having the stomach pains and cramping in her stomach (worse fter she took the abx), no diarhea, normal Bms.   - gave her results for her normal CT scan  - she feels worse on protonix so can stop it, have very bland diet now for gastritis  - start klonopin for anxiety/ sleep  - will try to move up her Gi appt for evaluation  - after a few weeks, when her stomach is feeling better she can start the evalaution for h pylori again

## 2004-06-28 NOTE — Nursing Note (Signed)
>>   Alejandra Martin     07/01/2004   9:09 am  Lab completed

## 2004-06-28 NOTE — Telephone Encounter (Signed)
Staff Message copied by [name] on 06/28/2004 at 12:07 PM  ------   Message from: [name], [name]   Created: 06/28/2004 at 10:36 AM   Regarding: MD Only   Contact: [phone]    Kenise Barraco [id number], 34 year old, female, [phone] (home) [phone] (work)    [name] NUMBER: [phone]  Cell phone:   Other phone:    Available times:    Patient's language of care: English    Patient does not need an interpreter.    Person calling on behalf of patient: patient (self)    Calls today to speak to Dr Domenica Reamer only.

## 2004-07-02 ENCOUNTER — Other Ambulatory Visit (HOSPITAL_BASED_OUTPATIENT_CLINIC_OR_DEPARTMENT_OTHER): Payer: Self-pay | Admitting: Internal Medicine

## 2004-07-02 ENCOUNTER — Telehealth (HOSPITAL_BASED_OUTPATIENT_CLINIC_OR_DEPARTMENT_OTHER): Payer: Self-pay | Admitting: Registered Nurse

## 2004-07-02 DIAGNOSIS — R1012 Left upper quadrant pain: Secondary | ICD-10-CM

## 2004-07-02 NOTE — Telephone Encounter (Signed)
Staff Message copied by Barbarann Ehlers on 07/02/2004 at 11:16 AM  ------   Message from: Asa Saunas   Created: 07/01/2004 at 2:49 PM   Regarding: when does she starts her new medication   Contact: [phone]    Alejandra Martin [id number], 34 year old, female, [phone] (home) [phone] (work)      Cell phone:   Other phone:    Available times:    Patient's language of care: English    Patient does not need an interpreter.    Person calling on behalf of patient: patient (self)    Calls today to speak to provider only.

## 2004-07-02 NOTE — Telephone Encounter (Signed)
[name] To Uniqua. She states that she took the medication and still has no appetite. She started the new medication today for the h-pylori but still does not feel well. She still feels full. She is worried about "masses and that something serious is wrong". Assured her that her the CT scan was normal and that she needed to have the endoscopy done and a f/u with GI. Dr. Domenica Reamer is in the process of moving up the GI appointment.

## 2004-07-02 NOTE — Telephone Encounter (Signed)
Staff Message copied by Barbarann Ehlers on 07/02/2004 at 11:16 AM  ------   Message from: Asa Saunas   Created: 07/02/2004 at 9:22 AM   Regarding: blood work results/ medication    Contact: [phone]    Alejandra Martin [id number], 34 year old, female, [phone] (home) [phone] (work)      Cell phone:   Other phone:    Available times:    Patient's language of care: English    Patient does not need an interpreter.    Person calling on behalf of patient: patient (self)    Calls today for test result(s).

## 2004-07-02 NOTE — Telephone Encounter (Signed)
Staff Message copied by Barbarann Ehlers on 07/02/2004 at 11:15 AM  ------   Message from: Loma Holloway   Created: 07/01/2004 at 11:23 AM   Contact: [phone]    Alejandra Martin [id number], 34 year old, female, [phone] (home) [phone] (work)    CALL BACK NUMBER:   Cell phone:   Other phone:    Available times:    Patient's language of care: English    Patient does not need an interpreter.    Person calling on behalf of patient: patient (self)    Calls today with questions and concerns. Patient is looking to speak to provider inregards to medication, please call    Thanks

## 2004-07-03 ENCOUNTER — Telehealth (HOSPITAL_BASED_OUTPATIENT_CLINIC_OR_DEPARTMENT_OTHER): Payer: Self-pay | Admitting: Registered Nurse

## 2004-07-03 NOTE — Telephone Encounter (Signed)
Staff Message copied by Barbarann Ehlers on 07/03/2004 at 3:10 PM  ------   Message from: [name]   Created: 07/03/2004 at 1:07 PM   Regarding: Patient question   Contact: [phone]    Alejandra Martin [id number], 34 year old, female, [phone] (home) [phone] (work)    CALL BACK NUMBER: [phone] until 3:30pm  Cell phone:   Other phone:    Available times:    Patient's language of care: English    Patient does not need an interpreter.    Person calling on behalf of patient: patient (self)    Calls today to speak to provider only.Patient has a question for Dr. Domenica Reamer.She would like to speak with her before 3:30pm if possible.  Thanks-mlo

## 2004-07-03 NOTE — Telephone Encounter (Signed)
[name] to pt. She states the she wants to speak to the provider about only about her medications. She is having mood swings since starting the new meds and wants to discuss them. She will be home after 5 PM.

## 2004-07-04 NOTE — Telephone Encounter (Signed)
I spoke with Alejandra Martin; she feels kind of spacy when she takes the flagyl, not herself, no nausea or vomotting  - since she already took one week of the other tx for h. Pylori, she can just take this one for an additional week then make an appt to see me for TOC  - her bloating is better  - encouraged her to increase PO intake since she is only eating one meal a day

## 2004-07-04 NOTE — Telephone Encounter (Signed)
Staff Message copied by Barbarann Ehlers on 07/04/2004 at 2:53 PM  ------   Message from: Loma Ada   Created: 07/04/2004 at 11:52 AM   Contact: [phone]    Alejandra Martin [id number], 34 year old, female, [phone] (home) [phone] (work)    CALL BACK NUMBER:   Cell phone:   Other phone:    Available times: Patient will be at lunch between 12-1 and leaves at 3pm    Patient's language of care: English    Patient does not need an interpreter.    Person calling on behalf of patient: patient (self)    Calls today with questions and concerns. Patient is looking to speak to provider about medications, please call    Thanks

## 2004-07-10 ENCOUNTER — Ambulatory Visit (HOSPITAL_BASED_OUTPATIENT_CLINIC_OR_DEPARTMENT_OTHER): Payer: Charity | Admitting: Internal Medicine

## 2004-07-10 VITALS — BP 118/86 | Temp 99.4°F | Ht 63.0 in | Wt 191.0 lb

## 2004-07-10 DIAGNOSIS — K2900 Acute gastritis without bleeding: Secondary | ICD-10-CM

## 2004-07-10 DIAGNOSIS — K1321 Leukoplakia of oral mucosa, including tongue: Secondary | ICD-10-CM

## 2004-07-10 MED ORDER — NYSTATIN 100000 UNIT/ML MT SUSP
OROMUCOSAL | Status: AC
Start: 2004-07-10 — End: 2004-07-25

## 2004-07-10 NOTE — Patient Instructions (Signed)
Continue to use Protonix once a day  Can use clonazepam twice a day for anxiety if needed  Next week bring in the stool sample for helicobacter pylori    Make an appointment to see me in 2 weeks

## 2004-07-10 NOTE — Progress Notes (Signed)
Shantella Blubaugh is a 34 year old female here for followup on her stomach pains, just finished a week of protonix/ metronidazole and tetracycline (the pharmacy never gave her the bismuth or told ehr to get it OTC), which came after a week of clarithro/ amox/ PPI.   She has been feeling worse on the medication, the deep pains in her stomach have gone away, but she hasn't been interested in eating, has lost weight, feels disoriented and strange on the medications.   They finsihed yesterday.   The patient denies flank pain, nausea or vomiting, dysphagia, change in bowel habits or black or bloody stools.  The patient denies dysuria, frequency or hematuria.  She has been drinking lots of water.  She ahs ben feeling down since she isn't getting ebtter, still worried that something is wrong.     PE:  pt appears fatigued.   The abdomen is soft without tenderness, guarding, mass, rebound or organomegaly.    PLAN:  535.00 ACUTE GASTRITIS W/O HEMORRHAGE (primary encounter diagnosis)  Note: she has completed 2 weeks of treatment, so will follow over the next week to see if she feels better off of the medications, can still take PPis  Recheck helicobacter pylori stool Ag next week  RTc in 2 weeks to follow with me  Note written for one week off of work  Follow her weight to see if she starts to ain weight again/ level off    528.6 LEUKOPLAKIA ORAL MUCOSA  Note: has some whitenness on the back of her tongue, nothing on sides of her mouth  Plan: NYSTATIN 100000 UNIT/ML MT SUSP   Trial nystatin  '

## 2004-07-17 ENCOUNTER — Ambulatory Visit (HOSPITAL_BASED_OUTPATIENT_CLINIC_OR_DEPARTMENT_OTHER): Payer: Charity | Admitting: Internal Medicine

## 2004-07-17 VITALS — BP 100/70 | Temp 99.1°F | Resp 20 | Wt 192.0 lb

## 2004-07-17 DIAGNOSIS — K2900 Acute gastritis without bleeding: Secondary | ICD-10-CM

## 2004-07-17 DIAGNOSIS — F341 Dysthymic disorder: Secondary | ICD-10-CM

## 2004-07-17 NOTE — Nursing Note (Signed)
>>   Martin, Alejandra     07/17/2004   1:13 pm  Pt here as a w/i. States she is still not feeling well. No appetite. Has stopped drinking coffee. (?) if she is depressed. To see Dr. Domenica Reamer today.

## 2004-07-18 LAB — HELICOBACTER PYLORI STOOL AG

## 2004-07-18 NOTE — Progress Notes (Signed)
Alejandra Martin is a 34 year old female here for followup on her abdominal pains, she finished her abx last wek, on PPI and still with epigastric/ diffuse abdominal pains, decreased apetite, feels she is losing weight since she doesn't want to eat. No diarhea or constipation, no blood in stools, no vomitting. Hasn't been to work ina Few weeks due to the pains and doesn't want to lose her job.   Concerned about cancer, or that soemthing is still wrong  Feels depressed that these pains have been going on for so long, feels that she hasn't felt the same since her miscaraige last year, wonders if she is depressed.     PE:  The abdomen is soft with some epigastric tenderness, no guarding, mass, rebound or organomegaly. Bowel sounds are normal.    PLAN:  535.00 ACUTE GASTRITIS W/O HEMORRHAGE (primary encounter diagnosis)  Note: refer to GI, will try to get earlier appt so she can have ? EGD to confirm that this is gastritis vs some other cause of eht pain, make sure she ahs had a complete eval  Plan: HELICOBACTER PYLORI STOOL AG (INHOUSE)   Send off h. Pylori retest for TOC    300.4 DYSTHYMIC DISORDER  Note: recommended that she see a counselor to discuss issues, not start meds now incase they make her nausea worse.   Plan: REFERRAL TO PSYCHIATRY (INT)

## 2004-07-19 ENCOUNTER — Ambulatory Visit (HOSPITAL_BASED_OUTPATIENT_CLINIC_OR_DEPARTMENT_OTHER): Payer: Charity | Admitting: Internal Medicine

## 2004-07-19 VITALS — BP 110/70 | HR 98 | Temp 99.2°F | Resp 14 | Ht 63.0 in | Wt 192.0 lb

## 2004-07-19 DIAGNOSIS — R197 Diarrhea, unspecified: Secondary | ICD-10-CM

## 2004-07-19 NOTE — Progress Notes (Signed)
Alejandra Martin is a 34 year old female here in followup. For 2 days she has had more abdomainl cramping, some watery diarhea, sometimes yellow, sometimes with mucous, sometimes darkish like she might be bleeding.     PE:  The abdomen is soft with mild epigastric tenderness, diffuse lower abdominal tenderness, no guarding, mass, rebound or organomegaly. Bowel sounds are normal.     PLAN:  787.91 DIARRHEA NOS (primary encounter diagnosis)  Note: discuseed with her that her repeat h. Pylori is negative, will check stool culture for c. diff  Plan: CLOSTRIDIUM DIFF TOXIN A   Have moved up ehr GI appt to 7/3, pt aware of appt

## 2004-07-22 ENCOUNTER — Encounter (HOSPITAL_BASED_OUTPATIENT_CLINIC_OR_DEPARTMENT_OTHER): Payer: Charity | Admitting: Gastroenterology

## 2004-07-22 DIAGNOSIS — R1084 Generalized abdominal pain: Secondary | ICD-10-CM

## 2004-07-22 DIAGNOSIS — R197 Diarrhea, unspecified: Secondary | ICD-10-CM

## 2004-07-22 DIAGNOSIS — K922 Gastrointestinal hemorrhage, unspecified: Secondary | ICD-10-CM

## 2004-07-22 LAB — CLOSTRIDIUM DIFF TOXIN A

## 2004-07-23 ENCOUNTER — Other Ambulatory Visit (HOSPITAL_BASED_OUTPATIENT_CLINIC_OR_DEPARTMENT_OTHER): Payer: Charity | Admitting: Lab

## 2004-07-23 DIAGNOSIS — R197 Diarrhea, unspecified: Secondary | ICD-10-CM

## 2004-07-23 LAB — COMPREHENSIVE METABOLIC PANEL
ALANINE AMINOTRANSFERASE: 13 IU/L (ref 7–35)
ALBUMIN: 3.8 g/dl (ref 3.4–4.8)
ALKALINE PHOSPHATASE: 79 IU/L (ref 25–106)
ASPARTATE AMINOTRANSFERASE: 17 IU/L (ref 8–34)
BILIRUBIN TOTAL: 0.2 mg/dl (ref 0.2–1.1)
BUN (UREA NITROGEN): 7 mg/dl (ref 6–20)
CALCIUM: 9.5 mg/dl (ref 8.6–10.0)
CARBON DIOXIDE: 24 mmol/L (ref 22–32)
CHLORIDE: 107 mmol/L (ref 101–111)
CREATININE: 0.8 mg/dl (ref 0.4–1.2)
Glucose Random: 73 mg/dl — ABNORMAL LOW (ref 74–160)
POTASSIUM: 3.7 mmol/L (ref 3.5–5.1)
SODIUM: 139 mmol/L (ref 135–144)
TOTAL PROTEIN: 6.8 g/dl (ref 5.9–7.5)

## 2004-07-23 LAB — BLOOD COUNT COMPLETE AUTOMATED
HEMATOCRIT: 39.8 % (ref 37.0–47.0)
HEMOGLOBIN: 13.2 g/dL (ref 12.0–16.0)
MEAN CORP HGB CONC: 33.1 g/dL (ref 32.0–36.0)
MEAN CORPUSCULAR HGB: 30.9 pg (ref 27.0–31.0)
MEAN CORPUSCULAR VOL: 93.1 fL (ref 81.0–99.0)
MEAN PLATELET VOLUME: 8.4 fL (ref 6.4–10.8)
PLATELET COUNT: 328 10*3/uL (ref 150–400)
RBC DISTRIBUTION WIDTH: 12.9 % (ref 11.5–14.3)
RED BLOOD CELL COUNT: 4.28 MIL/uL (ref 4.20–5.40)
WHITE BLOOD CELL COUNT: 13.6 10*3/uL — ABNORMAL HIGH (ref 4.8–10.8)

## 2004-07-23 LAB — CHG SEDIMENTATION RATE RBC NON-AUTOMATED: RBC SEDIMENTATION RATE: 28 MM/HR — ABNORMAL HIGH (ref 0–15)

## 2004-07-23 LAB — ~~LOC~~ MEDICAL SPECIALITIES

## 2004-07-23 NOTE — Nursing Note (Signed)
>>   Alejandra Martin, Alejandra Martin     07/23/2004   1:03 pm  Labs completed by ce and labs per dr Russella Dar payne

## 2004-07-25 ENCOUNTER — Ambulatory Visit (HOSPITAL_BASED_OUTPATIENT_CLINIC_OR_DEPARTMENT_OTHER): Payer: Charity | Admitting: Internal Medicine

## 2004-07-25 DIAGNOSIS — R197 Diarrhea, unspecified: Secondary | ICD-10-CM

## 2004-07-25 LAB — FECAL OCCULT (POINT OF CARE) OFFICE/INPATIENT TEST 1 CARD
LOT #: 2350
OCCULT BLOOD: POSITIVE
OCCULT BLOOD: POSITIVE
OCCULT BLOOD: POSITIVE

## 2004-07-25 NOTE — Progress Notes (Signed)
Milania Haubner is a 34 year old female here with ongoing abdominal pains, less pain at rest , but when she eats anything she feels abdominal cramping and pain and ahs diarhea. No fevers    PE:  BP 100/70   Temp 98.1   Resp 20   Wt 189 lbs (85.7kg)  The abdomen is soft some RUQ and LLQ tenderness, no guarding, mass, rebound or organomegaly. Bowel sounds are normal.     WBC slightly elevated    PLAN:  787.91 DIARRHEA NOS  Note: pt seen by GI, dropped off stool cultures today, plan for EGD and colonoscopy soon  Plan: CLOSTRIDIUM DIFF TOXIN A, GASTROINTESTINAL    CULTURE, OVA AND PARASITES, FECAL OCCULT (POINT   OF CARE)  Try to advance diet to broth and gatorade and ensure, curently just drinkking water which could make her more fatigued     Depression - will see if she can get in with the therpaist

## 2004-07-26 ENCOUNTER — Telehealth (HOSPITAL_BASED_OUTPATIENT_CLINIC_OR_DEPARTMENT_OTHER): Payer: Self-pay | Admitting: Internal Medicine

## 2004-07-26 DIAGNOSIS — A0472 Enterocolitis due to Clostridium difficile, not specified as recurrent: Secondary | ICD-10-CM | POA: Insufficient documentation

## 2004-07-26 HISTORY — DX: Enterocolitis due to Clostridium difficile, not specified as recurrent: A04.72

## 2004-07-26 LAB — OVA AND PARASITES
OVA AND PARASITES: NONE SEEN
OVA AND PARASITES: NONE SEEN
OVA AND PARASITES: NONE SEEN

## 2004-07-26 LAB — CLOSTRIDIUM DIFF TOXIN A

## 2004-07-26 MED ORDER — FLAGYL 500 MG PO TABS
ORAL_TABLET | ORAL | Status: AC
Start: 2004-07-26 — End: 2004-08-03

## 2004-07-26 MED ORDER — FLAGYL 500 MG PO TABS
ORAL_TABLET | ORAL | Status: AC
Start: 2004-07-26 — End: 2004-07-28

## 2004-07-26 NOTE — Telephone Encounter (Signed)
I called Alejandra Martin and let her know that her second C. Diff test was postive  -she agrees to start flagyl  - rx for 2 days faxed to [facility] in [place]  - rx for the other 8 [facility]

## 2004-07-28 LAB — GASTROINTESTINAL CULTURE

## 2004-07-29 ENCOUNTER — Telehealth (HOSPITAL_BASED_OUTPATIENT_CLINIC_OR_DEPARTMENT_OTHER): Payer: Self-pay

## 2004-07-29 LAB — ~~LOC~~ MEDICAL SPECIALITIES

## 2004-07-29 NOTE — Telephone Encounter (Signed)
Pt was recently prescribed flagyl it made her sick. Pt does not want to take anymore. Message forwarded to pcp.

## 2004-07-29 NOTE — Telephone Encounter (Signed)
Staff Message copied by Odis Luster on 07/29/2004 at 8:46 PM  ------   Message from: Asa Saunas   Created: 07/29/2004 at 8:45 AM   Regarding: please call before 12   Contact: [phone]    Alejandra Martin [id number], 34 year old, female, [phone] (home) [phone] (work)      Cell phone:   Other phone:    Available times:    Patient's language of care: English    Patient does not need an interpreter.    Person calling on behalf of patient: patient (self)    Calls today Dr Domenica Reamer call for prescription and it made her sick, does not want to take it again

## 2004-07-30 NOTE — Telephone Encounter (Signed)
If pt calls back please recommend that she can discuss this more with Dr. Suzie Portela (the gastroenterologist) when she sees him this Thursday

## 2004-07-30 NOTE — Telephone Encounter (Signed)
Staff Message copied by Odis Luster on 07/30/2004 at 7:40 PM  ------   Message from: Asa Saunas   Created: 07/30/2004 at 8:55 AM   Regarding: speak to [place] only /medication   Contact: [phone]    Alejandra Martin [id number], 34 year old, female, [phone] (home) [phone] (work)      Cell phone:   Other phone:    Available times:    Patient's language of care: English    Patient does not need an interpreter.    Person calling on behalf of patient: patient (self)    Calls today with questions and concerns. Will be at this number [phone] until 12, and she will be home afterwards

## 2004-07-30 NOTE — Telephone Encounter (Signed)
Tc to pt at home no answer I left a message that pt is to discuss that she can not take her medication for her stomach with Dr. Rolla Etienne the GI specialist who she has an appt this thrusday.

## 2004-07-30 NOTE — Telephone Encounter (Signed)
Tc to pt this morning and a woman answered the phone and i was told pt would call me back

## 2004-08-01 ENCOUNTER — Ambulatory Visit: Payer: Self-pay | Admitting: Ophthalmology

## 2004-08-01 ENCOUNTER — Encounter (HOSPITAL_BASED_OUTPATIENT_CLINIC_OR_DEPARTMENT_OTHER): Payer: Self-pay | Admitting: Gastroenterology

## 2004-08-01 DIAGNOSIS — H5200 Hypermetropia, unspecified eye: Secondary | ICD-10-CM

## 2004-08-01 LAB — SURGICAL PATH SPECIMEN

## 2004-08-02 ENCOUNTER — Telehealth (HOSPITAL_BASED_OUTPATIENT_CLINIC_OR_DEPARTMENT_OTHER): Payer: Self-pay | Admitting: Registered Nurse

## 2004-08-02 NOTE — Telephone Encounter (Signed)
Attempted to call. Tel # constantly busy.

## 2004-08-02 NOTE — Telephone Encounter (Signed)
Staff Message copied by Barbarann Ehlers on 08/02/2004 at 1:50 PM  ------   Message from: Asa Saunas   Created: 08/02/2004 at 12:29 PM   Regarding: speak to [place] after 2 pm   Contact: [phone]    Alejandra Martin [id number], 34 year old, female, [phone] (home) [phone] (work)      Cell phone:   Other phone:    Available times:    Patient's language of care: English    Patient does not need an interpreter.    Person calling on behalf of patient: patient (self)    Calls today to speak to provider only.

## 2004-08-05 ENCOUNTER — Ambulatory Visit (HOSPITAL_BASED_OUTPATIENT_CLINIC_OR_DEPARTMENT_OTHER): Payer: Charity

## 2004-08-05 ENCOUNTER — Telehealth (HOSPITAL_BASED_OUTPATIENT_CLINIC_OR_DEPARTMENT_OTHER): Payer: Self-pay | Admitting: Registered Nurse

## 2004-08-05 VITALS — BP 110/70 | HR 76 | Temp 99.3°F | Resp 16 | Ht 63.0 in | Wt 192.0 lb

## 2004-08-05 DIAGNOSIS — T7840XA Allergy, unspecified, initial encounter: Secondary | ICD-10-CM

## 2004-08-05 DIAGNOSIS — R51 Headache: Secondary | ICD-10-CM

## 2004-08-05 DIAGNOSIS — R079 Chest pain, unspecified: Secondary | ICD-10-CM

## 2004-08-05 MED ORDER — BUTALBITAL-APAP-CAFFEINE 50-325-40 MG PO TABS
ORAL_TABLET | ORAL | Status: AC
Start: 2004-08-05 — End: 2004-09-04

## 2004-08-05 NOTE — Telephone Encounter (Signed)
Please call her and let her know that the vancomycin is a good antibiotic for sinusitis, and I think she should finish the course of vanco and use OTC cold and sinus medications (sudafed can be very helpful, don't take before bed or it will keep her up)  If she isn't feeling better she can call next Monday and you can double-book her in my schedule

## 2004-08-05 NOTE — Progress Notes (Signed)
Alejandra Martin, 34 year old, female  Patient has a headache intermittantly with tightness and pressure over facial area. Pain increases with bending forwared.  Patient presents with:    Headache   Other - pain at right body side   that is mild in nature. The onset of this chest pain was gradual, beginning 3 days ago and is waxing and waning.    Patient describes the nature of the pain as pressure.  The pain radiates to the arm .   The pain is triggered by moderate activity, emotional activity and emotional stress.  The pain is relieved by drinking water.    Additional symptoms; weakness, dizziness and "clammy" feeling.    Based on the presented symptoms an emergency room visit is not indicated.  Patient had an EKG at [facility] that was normal in April 06.  She has a LDL cholesterol over 200.  If an Emergency Room visit is not indicated then a provider visit is indicated unless an alternative treatment plan is ordered by the Provider.  Sinus pressure WILL TRY oCEAN NASAL SPRAY  Complete Vanco  F/U AFTER STRESS TEST      BP 110/70   Pulse 76   Temp (Src) 99.3 (Oral)   Resp 16   Ht 5\' 3"  (1.89m)   Wt 192 lbs (87.1kg)  LUNGS CLEAR  APICAL RATE REGULAR  EXT NO EDEMA    ASSESSMENT /PLAN  786.50 CHEST PAIN NOS (primary encounter diagnosi  Plan: ELECTROCARDIOGRAM, TRACING       995.3 ALLERGY, UNSPEC    784.0 HEADACHE  Note:   Plan: BUTALBITAL-APAP-CAFFEINE 50-325-40 MG OR TABS   OCEAN NASAL SPRAY  STRESS TEST

## 2004-08-05 NOTE — Telephone Encounter (Signed)
Pt was here for a w/i and saw Wonda Cheng. She feels like she has a sinus infection. She has pressure, photophobia, ear and neck pain. Hurts especially when leaning forward. Can touch her chin to her chest but feels pain down the back of her neck. Temp today 99.3. She is requesting something be called in for her for a sinus infection. To provider for review.

## 2004-08-06 NOTE — Telephone Encounter (Signed)
Pt called back. I advised her that another antibiotic would not be prescribed at this time. She agrees to finish the vanco and call if she is not better by Monday. She will also try OTC decongestant.

## 2004-08-06 NOTE — Telephone Encounter (Signed)
Staff Message copied by Barbarann Ehlers on 08/06/2004 at 2:42 PM  ------   Message from: Asa Saunas   Created: 08/06/2004 at 12:55 PM   Regarding: returning nurse's call   Contact: [phone]    Alejandra Martin [id number], 34 year old, female, [phone] (home) [phone] (work)      Cell phone:   Other phone:    Available times:    Patient's language of care: English    Patient does not need an interpreter.    Person calling on behalf of patient: patient (self)    Calls today Returning phonecall

## 2004-08-06 NOTE — Telephone Encounter (Signed)
[name] to pt. No answer. Left message on v-mail that I was returning her call.

## 2004-08-06 NOTE — Telephone Encounter (Signed)
Staff Message copied by Barbarann Ehlers on 08/06/2004 at 9:15 AM  ------   Message from: Rozann Lesches   Created: 08/06/2004 at 8:41 AM   Contact: [phone]    Alejandra Martin [id number], 34 year old, female, [phone] (home) [phone] (work)    [name] NUMBER: [phone]  Cell phone:   Other phone:    Available times:    Patient's language of care: English    Patient does not need an interpreter.    Person calling on behalf of patient: patient (self)    Calls today for test result(s). Nad to ask if Alejandra Martin ask Dr. Domenica Reamer about her medication

## 2004-08-06 NOTE — Telephone Encounter (Signed)
Staff Message copied by Odis Luster on 08/06/2004 at 8:57 AM  ------   Message from: Rozann Lesches   Created: 08/06/2004 at 8:41 AM   Contact: [phone]    Darien Mignogna [id number], 34 year old, female, [phone] (home) [phone] (work)    [name] NUMBER: [phone]  Cell phone:   Other phone:    Available times:    Patient's language of care: English    Patient does not need an interpreter.    Person calling on behalf of patient: patient (self)    Calls today for test result(s). Nad to ask if Britta Mccreedy ask Dr. Domenica Reamer about her medication

## 2004-08-07 ENCOUNTER — Encounter (HOSPITAL_BASED_OUTPATIENT_CLINIC_OR_DEPARTMENT_OTHER): Payer: Charity | Admitting: Gastroenterology

## 2004-08-07 DIAGNOSIS — K529 Noninfective gastroenteritis and colitis, unspecified: Secondary | ICD-10-CM

## 2004-08-07 DIAGNOSIS — K2971 Gastritis, unspecified, with bleeding: Secondary | ICD-10-CM

## 2004-08-07 DIAGNOSIS — Z860100 Personal history of colon polyps, unspecified: Secondary | ICD-10-CM

## 2004-08-07 DIAGNOSIS — K2991 Gastroduodenitis, unspecified, with bleeding: Principal | ICD-10-CM

## 2004-08-07 NOTE — Telephone Encounter (Signed)
Pt was seen

## 2004-08-08 ENCOUNTER — Telehealth (HOSPITAL_BASED_OUTPATIENT_CLINIC_OR_DEPARTMENT_OTHER): Payer: Self-pay | Admitting: Registered Nurse

## 2004-08-08 LAB — ~~LOC~~ MEDICAL SPECIALITIES

## 2004-08-08 NOTE — Telephone Encounter (Signed)
[name] To pt. She states that she still has the sinus congestion, sneezing, and neck pain. Last night she was not able to sleep due to the nek pain. She finishes her antibiotics tomorrow. Advised her that her EKG was normal. Appointment made for 08/09/04 for assessment of sinus/neck pain.

## 2004-08-08 NOTE — Telephone Encounter (Signed)
Attempted to call pt. Tel # constantly busy.

## 2004-08-08 NOTE — Telephone Encounter (Signed)
Staff Message copied by Barbarann Ehlers on 08/08/2004 at 1:33 PM  ------   Message from: Asa Saunas   Created: 08/08/2004 at 1:24 PM   Regarding: speak to [place]   Contact: [phone]    Alejandra Martin [id number], 34 year old, female, [phone] (home) [phone] (work)      Cell phone:   Other phone:    Available times:    Patient's language of care: English    Patient does not need an interpreter.    Person calling on behalf of patient: patient (self)    Calls today with questions and concerns.

## 2004-08-09 ENCOUNTER — Encounter (HOSPITAL_BASED_OUTPATIENT_CLINIC_OR_DEPARTMENT_OTHER): Payer: Charity | Admitting: Gastroenterology

## 2004-08-09 ENCOUNTER — Ambulatory Visit (HOSPITAL_BASED_OUTPATIENT_CLINIC_OR_DEPARTMENT_OTHER): Payer: Charity | Admitting: Internal Medicine

## 2004-08-09 DIAGNOSIS — A0472 Enterocolitis due to Clostridium difficile, not specified as recurrent: Secondary | ICD-10-CM

## 2004-08-09 DIAGNOSIS — F329 Major depressive disorder, single episode, unspecified: Secondary | ICD-10-CM

## 2004-08-09 DIAGNOSIS — F3289 Other specified depressive episodes: Secondary | ICD-10-CM

## 2004-08-09 MED ORDER — FLEXERIL 10 MG PO TABS
ORAL_TABLET | ORAL | Status: AC
Start: 2004-08-09 — End: 2004-09-08

## 2004-08-09 MED ORDER — SUDAFED 60 MG OR TABS
ORAL_TABLET | ORAL | Status: AC
Start: 2004-08-09 — End: 2004-08-19

## 2004-08-09 MED ORDER — PROZAC 10 MG PO CAPS
ORAL_CAPSULE | ORAL | Status: DC
Start: 2004-08-09 — End: 2004-09-02

## 2004-08-09 NOTE — Progress Notes (Signed)
Alejandra Martin is a 34 year old female here for followup:   - her abdomen feels much better after the vanco, on her last dose, she got the result of her colonoscopy, one of the polyps was large, will need repeat colonoscopy in 1 year  - she is concerned about her sinuses, has sinus pressure when she looks forward, gets pain across her forehead and then down in to her neck    PE:  BP 120/70   Pulse 70   Temp (Src) 98.2 (Oral)   Resp 12   Ht 5\' 3"  (1.77m)   Wt 193 lbs (87.5kg)  Slight pressure in sinuses, no pain to palpation, TMs normal, neck supple, tenderness over trapezius bialterally  Abdomene soft with minimal tendneress esp LLQ (improved)    PLAN:  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 Gwyneth Sprout for followup

## 2004-08-09 NOTE — Patient Instructions (Signed)
Please make an appointment with Dr. Gwyneth Sprout in 1 month for followup

## 2004-08-12 ENCOUNTER — Other Ambulatory Visit (HOSPITAL_BASED_OUTPATIENT_CLINIC_OR_DEPARTMENT_OTHER): Payer: Self-pay | Admitting: Registered Nurse

## 2004-08-12 DIAGNOSIS — Z130 Encounter for screening for diseases of the blood and blood-forming organs and certain disorders involving the immune mechanism: Secondary | ICD-10-CM

## 2004-08-12 DIAGNOSIS — Z1329 Encounter for screening for other suspected endocrine disorder: Principal | ICD-10-CM

## 2004-08-12 DIAGNOSIS — Z13228 Encounter for screening for other metabolic disorders: Principal | ICD-10-CM

## 2004-08-12 NOTE — Telephone Encounter (Signed)
Staff Message copied by Barbarann Ehlers on 08/12/2004 at 1:06 PM  ------   Message from: Asa Saunas   Created: 08/12/2004 at 9:52 AM   Regarding: speak to nurse before 12    Contact: [phone]    Alejandra Martin [id number], 34 year old, female, [phone] (home) [phone] (work)      Cell phone:   Other phone:    Available times:    Patient's language of care: English    Patient does not need an interpreter.    Person calling on behalf of patient: patient (self)    Calls today with questions and concerns.

## 2004-08-12 NOTE — Telephone Encounter (Signed)
[name] to pt. No answer. No v-mail. Will try again later.

## 2004-08-12 NOTE — Telephone Encounter (Signed)
Staff Message copied by Barbarann Ehlers on 08/12/2004 at 3:52 PM  ------   Message from: Loma Octavia   Created: 08/12/2004 at 1:38 PM   Contact: [phone]    Alejandra Martin [id number], 34 year old, female, [phone] (home) [phone] (work)    CALL BACK NUMBER:   Cell phone:   Other phone:    Available times: until 2pm     Patient's language of care: English    Patient does not need an interpreter.    Person calling on behalf of patient: patient (self)    Calls today with questions and concerns. Patient is looking to speak to nurse about medication prewscribed, please call    Thanks

## 2004-08-13 ENCOUNTER — Other Ambulatory Visit (HOSPITAL_BASED_OUTPATIENT_CLINIC_OR_DEPARTMENT_OTHER): Payer: Charity | Admitting: Lab

## 2004-08-13 DIAGNOSIS — Z130 Encounter for screening for diseases of the blood and blood-forming organs and certain disorders involving the immune mechanism: Secondary | ICD-10-CM

## 2004-08-13 DIAGNOSIS — Z1329 Encounter for screening for other suspected endocrine disorder: Principal | ICD-10-CM

## 2004-08-13 DIAGNOSIS — Z13228 Encounter for screening for other metabolic disorders: Principal | ICD-10-CM

## 2004-08-13 LAB — BLOOD COUNT COMPLETE AUTO&AUTO DIFRNTL WBC
BASOPHIL %: 0.3 % (ref 0–2)
EOSINOPHIL %: 0.8 % (ref 0–7)
HEMATOCRIT: 37.7 % (ref 37.0–47.0)
HEMOGLOBIN: 12.7 g/dL (ref 12.0–16.0)
LYMPHOCYTE %: 30 % (ref 12–39)
MEAN CORP HGB CONC: 33.6 g/dL (ref 32.0–36.0)
MEAN CORPUSCULAR HGB: 31.1 pg — ABNORMAL HIGH (ref 27.0–31.0)
MEAN CORPUSCULAR VOL: 92.7 fL (ref 81.0–99.0)
MEAN PLATELET VOLUME: 8.4 fL (ref 6.4–10.8)
MONOCYTE %: 5.2 % (ref 1–12)
NEUTROPHIL %: 63.7 % (ref 46–79)
PLATELET COUNT: 322 10*3/uL (ref 150–400)
RBC DISTRIBUTION WIDTH: 12.4 % (ref 11.5–14.3)
RED BLOOD CELL COUNT: 4.07 MIL/uL — ABNORMAL LOW (ref 4.20–5.40)
WHITE BLOOD CELL COUNT: 8.9 10*3/uL (ref 4.8–10.8)

## 2004-08-13 LAB — CHG LIPID PANEL
Cholesterol: 257 mg/dl (ref 0–200)
HIGH DENSITY LIPOPROTEIN: 40 mg/dl (ref 35–85)
LOW DENSITY LIPOPROTEIN DIRECT: 222 mg/dl (ref 0–100)
RISK FACTOR: 6.4 — ABNORMAL HIGH (ref ?–4.4)
TRIGLYCERIDES: 90 mg/dl (ref 0–150)

## 2004-08-13 NOTE — Telephone Encounter (Signed)
Pt notified. Already on her way.

## 2004-08-13 NOTE — Telephone Encounter (Signed)
Staff Message copied by Barbarann Ehlers on 08/13/2004 at 2:31 PM  ------   Message from: Asa Saunas   Created: 08/13/2004 at 12:57 PM   Regarding: PLEASE CALL PT   Contact: [phone]    Michelina Mexicano [id number], 34 year old, female, [phone] (home) [phone] (work)      Cell phone:   Other phone:    Available times:    Patient's language of care: English    Patient does not need an interpreter.    Person calling on behalf of patient: patient (self)    Calls today

## 2004-08-13 NOTE — Nursing Note (Signed)
>>   [name], Alejandra Martin     08/13/2004   4:25 pm  Lab completed

## 2004-08-13 NOTE — Telephone Encounter (Signed)
OK to get blood work  Future orders left for her  Please let her know she can come in for LAB ONLY

## 2004-08-13 NOTE — Telephone Encounter (Signed)
[name] to pt. She states that she is having chills, becomes sweaty, and then becomes dizzy. This has been happening for 3 weeks. She is going to get prescriptions at [facility] and she wants to know if she can have a blood test here to check cholesterol and any infection that she may have in her blood stream. To provider for review.

## 2004-08-15 ENCOUNTER — Other Ambulatory Visit (HOSPITAL_BASED_OUTPATIENT_CLINIC_OR_DEPARTMENT_OTHER): Payer: Self-pay | Admitting: Registered Nurse

## 2004-08-15 DIAGNOSIS — T7840XA Allergy, unspecified, initial encounter: Secondary | ICD-10-CM

## 2004-08-15 NOTE — Telephone Encounter (Signed)
[name] to pt. Told her that a letter was sent regarding reducing cholesterol.     She is also stating that the Allegra and sudafed are not working She would like something stronger called in. To provider for review.

## 2004-08-15 NOTE — Telephone Encounter (Signed)
Staff Message copied by Barbarann Ehlers on 08/15/2004 at 11:58 AM  ------   Message from: Asa Saunas   Created: 08/15/2004 at 9:45 AM   Regarding: blood results   Contact: [phone]    Alejandra Martin [id number], 34 year old, female, [phone] (home) [phone] (work)      Cell phone:   Other phone:    Available times:    Patient's language of care: English    Patient does not need an interpreter.    Person calling on behalf of patient: patient (self)    Calls today for test result(s).

## 2004-08-16 MED ORDER — ZYRTEC 10 MG PO TABS
ORAL_TABLET | ORAL | Status: AC
Start: 2004-08-16 — End: 2005-02-12

## 2004-08-16 NOTE — Telephone Encounter (Signed)
Rx and prior authorization [facility] sent to [facility], will wait for response; tried to call Rukaya but her phone number was busy  - can you call and leave her a message to let her know?

## 2004-08-16 NOTE — Telephone Encounter (Signed)
Pt.notified

## 2004-08-16 NOTE — Telephone Encounter (Signed)
Staff Message copied by Barbarann Ehlers on 08/16/2004 at 2:30 PM  ------   Message from: [name]   Created: 08/16/2004 at 2:16 PM   Regarding: Allergy meds   Contact: [phone]    Ladelle Teodoro [id number], 34 year old, female, [phone] (home) [phone] (work)    [name] NUMBER: [phone]  Cell phone:   Other phone:    Available times:    Patient's language of care: English    Patient does not need an interpreter.    Person calling on behalf of patient: patient (self)    Calls today with questions and concerns.Patient wondering if the nurse could call regarding her allergy medication.O.K. To leave a message on the patients machine.  Thanks-mlo

## 2004-08-19 ENCOUNTER — Encounter (HOSPITAL_BASED_OUTPATIENT_CLINIC_OR_DEPARTMENT_OTHER): Payer: Self-pay | Admitting: Gastroenterology

## 2004-08-23 ENCOUNTER — Telehealth (HOSPITAL_BASED_OUTPATIENT_CLINIC_OR_DEPARTMENT_OTHER): Payer: Self-pay | Admitting: Registered Nurse

## 2004-08-23 NOTE — Telephone Encounter (Signed)
[name] to pt. No answer. Left message on v-mail that I was returning her call.

## 2004-08-23 NOTE — Telephone Encounter (Signed)
Staff Message copied by Barbarann Ehlers on 08/23/2004 at 11:03 AM  ------   Message from: Asa Saunas   Created: 08/22/2004 at 12:02 PM   Regarding: call before 2   Contact: [phone]    Alejandra Martin [id number], 34 year old, female, [phone] (home) [phone] (work)  Cell phone:   Other phone:    Available times:    Patient's language of care: English    Patient does not need an interpreter.    Person calling on behalf of patient: patient (self)    Calls today

## 2004-08-23 NOTE — Telephone Encounter (Signed)
Staff Message copied by Barbarann Ehlers on 08/23/2004 at 5:34 PM  ------   Message from: Asa Saunas   Created: 08/23/2004 at 2:57 PM   Regarding: Returning phonecall   Contact: [phone]    Alejandra Martin [id number], 34 year old, female, [phone] (home) [phone] (work)      Cell phone:   Other phone:    Available times:    Patient's language of care: English    Patient does not need an interpreter.    Person calling on behalf of patient: mother    Calls today Returning phonecall

## 2004-09-02 ENCOUNTER — Encounter (HOSPITAL_BASED_OUTPATIENT_CLINIC_OR_DEPARTMENT_OTHER): Payer: Charity | Admitting: Internal Medicine

## 2004-09-02 ENCOUNTER — Ambulatory Visit (HOSPITAL_BASED_OUTPATIENT_CLINIC_OR_DEPARTMENT_OTHER): Payer: Charity

## 2004-09-02 ENCOUNTER — Ambulatory Visit (HOSPITAL_BASED_OUTPATIENT_CLINIC_OR_DEPARTMENT_OTHER): Payer: Charity | Admitting: Internal Medicine

## 2004-09-02 VITALS — BP 110/80 | HR 105 | Temp 100.2°F | Wt 192.0 lb

## 2004-09-02 DIAGNOSIS — F321 Major depressive disorder, single episode, moderate: Secondary | ICD-10-CM

## 2004-09-02 DIAGNOSIS — E7800 Pure hypercholesterolemia, unspecified: Secondary | ICD-10-CM | POA: Insufficient documentation

## 2004-09-02 DIAGNOSIS — T7840XA Allergy, unspecified, initial encounter: Secondary | ICD-10-CM

## 2004-09-02 MED ORDER — CLARITIN 10 MG PO TABS
ORAL_TABLET | ORAL | Status: DC
Start: 2004-09-02 — End: 2004-09-02

## 2004-09-02 MED ORDER — FLUOXETINE HCL 20 MG PO CAPS
ORAL_CAPSULE | ORAL | Status: DC
Start: 2004-09-02 — End: 2004-11-15

## 2004-09-02 NOTE — Progress Notes (Signed)
SUBJECTIVE:  Discuss cholesterols  And cbc    Discuss head pressure, tightness episodes (not pain), neck pain. Neck feels siff, nad eyes hurt. Each episode lasts about 5 min. Face feels stuffed. Occaisional sneeze. Pressure under eyes. Ears get stuffed. These began 1 mo ago. Sometimes accompanied by blurry vision.     She thinks the head pressure sensation was improved by prozac    OBJECTIVE:  In no distress    HEENT PERRL, EOMI, THROAT CLEAR, TMS slight loss of light reflex  Neck supple, nontender    Chest clear    A/p  1) h/a congestion - likely allergy. Could have uri also. She did not know zyrtec was available. She will try zyrtec    2) high lipids - recheck fasting    High wbc lat month - nl now

## 2004-09-02 NOTE — Progress Notes (Signed)
ADULT PSYCHIATRY INITIAL EVALUATION      INTERPRETER : No interpreter needed.    CHIEF COMPLAINT: "I've had a lot of stress over the past year and got depressed. 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 Reports feeling sad/depressed, low energy, decreased interest in social activies, decreased libido, poor motivation, trouble conentrating, feeling impatient with husband and having generalized aches/pains, as well as being more somatically focuses. In the last year, she had a miscarriage (has been trying to get pregnant for three years) and switched jobs. 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 Pt feels that perhaps she was mildly depressed after miscarriage but symptoms exacerbated after Hyplori tx and since she has been unemployed (had to quite b/c of GI symptoms r/t Hpylori). Continues to be "nervous" and worry about many things,"I've always been a worrier." Denies any panic attacks. Denies any SI/HI.    CURRENT MEDICATIONS: Current outpatient prescriptions prior to 09/02/04:  Current outpatient prescriptions:   FLUOXETINE HCL 20 MG OR CAPS, 1 CAPSULE EVERY MORNING, Disp: 30, Rfl: 2; ZYRTEC 10 MG OR TABS, 1 TABLET DAILY, Disp: 30, Rfl: 5; FLEXERIL 10 MG OR TABS, 1/2 - 1 TABLET 3 TIMES DAILY for neck tension, Disp: 30, Rfl: 0; BUTALBITAL-APAP-CAFFEINE 50-325-40 MG OR TABS, 1 TABLETS EVERY 4 HOURS AS NEEDED, Disp: 28, Rfl: 0; FLONASE 50 MCG/DOSE NA INHA, two sprays to each nostril once a day , Disp: 1, Rfl: 5; ALLEGRA 60 MG OR CAPS, 1 CAPSULE TWICE DAILY, Disp: 60, Rfl: 5     Past Medications: Was given Klonopin by PCP to treat acute anxiety in April/May, pt took one pill and felt extremely drossy and with unstead gait-has not taken any more of the med    CURRENT TREATMENT:  None.    System Involvement: None.    PAST PSYCHIATRIC HISTORY: None. Admits to always being anxious, but denies that it has affected her level of functioning, quality of life, or relationships. No previous psychiatric tx before today.    SUBSTANCE USE: Denies any past/current ETOH or illicit drug use, only drinks ETOh occasionally as social events.    Family Constellation: Parents both still alive and married in the area. Family moved to [name] from [place] when she was 34 yo. Has nine siblings, she is the third oldest. Reports she had a strict childhood, but it was very happy. Reports family is still very close. Has been trying to conceive for 3 years, had one miscarriage. She nor her husband have undergone any fertility testing/procedures. Married x 7years, states she is happily married, husband very supportive.    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 Worked at [employer] for 4 years until one year ago but it was very stressful, switched to medical records job at [employer], had to quit recently b/c problems associated with Hpylori    Trauma History: Denies    MEDICAL HISTORY: See problem list    MENTAL STATUS EXAM:  Appearance:Neatly dressed overweight female, looks stated age  Behavior: Pleasant and cooperative, very happy to have appointment to talk, good  eye contact  Alertness: Alert and oriented x3, good concentration and attention  Speech: Normal rate, rhythm and volume  Mood: "Depressed"  Affect: Full range, congruent with content of thought, tearful when talking about miscarriage and not feeling like herself, stress on marriage  Thought Process: logical and linear, goal directed  Thought Content: depressive sx,somatic complaints, inability to conceive, supportive husband; no obsessions or delusions  noted  Perceptions: Denies any A/V hallucinations or altered perceptions  Judgment/Impulse Control: Intact   Insight: Has excellent insight and knowledge about depression/anxiety  Cognition: Short term and remote memory intact, appears to have above average intelligence  Suicidal/Homicidal: Denies any SI/HI    BIO/PSYCHO/SOCIAL AND RISK FORMULATION(S): Presents with first major depressive episode. She has always had baseline anxiety, but anxiety does seem to have increased with onset of depressive symptoms. Has fx of mental illness but onset of depressive sx also correlate with stress of Hpylori, job Scientist, product/process development and recent misscarriage which she never talked to anyone about. She is somatically focused, feels that the depression has increased aches/pains, headaches and possble her period. She denies any SE from the fluoxetine and reports positive response thus far to medication, pt could benefit from dose increase to 20mg  daily.     DIAGNOSES:  Axis I (primary): Major Depressive Episode, Single Episode, Moderate  Axis II: deferred  Axis III: recent Hpylori  Axis IV: unemployed  Axis V (current): 55   Axis V (highest in past year): 75    RISK ASSESSMENT (per scale):  Suicide:low  Violence:low  Addiction:low    PLAN: Increase fluoxetine to 20mg  daily. Educated pt and answered her many questions about depression as an illness and link to somatic complaints. Provided with written materials on fluoxetine, depression and given PHQ9 to complete at home so that we can review at follow-up visit. Discussed risks associated with SSRI's and pregnancy should she become pregnant, pt states she understands the risks and would likely discontinue the medication if she becomes pregnant, but wants to continue taking it now even thought she is actively trying to become pregnant. Encouraged to exercise and eat a healthy diet to help with her depressive/anxiety symptoms, as well as lower her cholesterol and weight. Pt to return for f/u  in 4-5 weeks.     Pallie Swigert Anne Laurance Heide, RNCS

## 2004-09-05 ENCOUNTER — Telehealth (HOSPITAL_BASED_OUTPATIENT_CLINIC_OR_DEPARTMENT_OTHER): Payer: Self-pay

## 2004-09-05 NOTE — Telephone Encounter (Signed)
Pt called clinic stating she was feeling "sick" after increase fluoxetine dose to 20mg  on Tuesday.Reports mild HA and increased daytime fatigue. Discussed with pt that is normal to experience some mild side effects with dose increase of SSRI, similar to side effects that occur when SSRI's are started. Pt encouraged to take medication at night so fatigue is not problematic during the day. Pt continues to be somatically focused, admits to being oversensitive about any change in body or somatic symptom. We discussed that perhaps if she does not focus on her somatic symptoms as much, she may find that they are not as severe. Pt agrees with plan, will take fluoxetine 20mg  q hs starting tomorrow (already took am dose today). Will notify clinic if SE do not go away and script needs to be changed back to 10mg  daily.

## 2004-09-06 LAB — ~~LOC~~-OPERATIVE REPORT

## 2004-09-07 NOTE — Telephone Encounter (Signed)
Unable to reach pt. Encounter closed

## 2004-09-11 ENCOUNTER — Ambulatory Visit (HOSPITAL_BASED_OUTPATIENT_CLINIC_OR_DEPARTMENT_OTHER): Payer: Self-pay | Admitting: Geriatric Medicine

## 2004-09-11 ENCOUNTER — Other Ambulatory Visit (HOSPITAL_BASED_OUTPATIENT_CLINIC_OR_DEPARTMENT_OTHER): Payer: Charity | Admitting: Lab

## 2004-09-11 DIAGNOSIS — E7800 Pure hypercholesterolemia, unspecified: Secondary | ICD-10-CM

## 2004-09-11 LAB — CHG LIPID PANEL
Cholesterol: 259 mg/dl (ref 0–200)
HIGH DENSITY LIPOPROTEIN: 46 mg/dl (ref 35–85)
LOW DENSITY LIPOPROTEIN DIRECT: 225 mg/dl (ref 0–100)
RISK FACTOR: 5.6 — ABNORMAL HIGH (ref ?–4.4)
TRIGLYCERIDES: 70 mg/dl (ref 0–150)

## 2004-09-11 NOTE — Nursing Note (Signed)
>>   Alejandra Martin, Alejandra Martin     09/11/2004   4:03 pm  Lab completed

## 2004-09-13 ENCOUNTER — Telehealth (HOSPITAL_BASED_OUTPATIENT_CLINIC_OR_DEPARTMENT_OTHER): Payer: Self-pay | Admitting: Registered Nurse

## 2004-09-13 NOTE — Telephone Encounter (Signed)
Staff Message copied by Barbarann Ehlers on 09/13/2004 at 5:29 PM  ------   Message from: [name]   Created: 09/13/2004 at 1:37 PM   Regarding: Result inquiry   Contact: [phone]    Alejandra Martin [id number], 34 year old, female, [phone] (home) [phone] (work)    [name] NUMBER: [phone]  Cell phone:   Other phone:    Available times:    Patient's language of care: English    Patient does not need an interpreter.    Person calling on behalf of patient: patient (self)    Calls today for test result(s).Patient says that she gets her results"after 24 hours".She seems very nervous about them.please call to advise.  Thanks-mlo

## 2004-09-13 NOTE — Telephone Encounter (Signed)
Lipids   Cholesterol (mg/dl)   Date Value   57/84/6962 259*  ----------    LDL (mg/dl)   Date Value   95/28/4132 225*  ----------    HDL (mg/dl)   Date Value   44/02/270 46   ----------    TRIGLYCERIDES (mg/dl)   Date Value   53/66/4403 70   ----------  LFTs   ALANINE AMINOTRANSFERASE (IU/L)   Date Value   07/23/2004 13   ----------    ASPARTATE AMINOTRANSFERAS (IU/L)   Date Value   07/23/2004 17   ----------    ALBUMIN (g/dl)   Date Value   47/42/5956 3.8   ----------    No results found for this basename: TP:1    BILIRUBIN DIRECT (mg/dl)   Date Value   38/75/6433 0.1   ----------    BILIRUBIN TOTAL (mg/dl)   Date Value   29/51/8841 0.2   ----------    ALKALINE PHOSPHATASE (IU/L)   Date Value   07/23/2004 79   ----------

## 2004-09-17 NOTE — Telephone Encounter (Signed)
Staff Message copied by Barbarann Ehlers on 09/17/2004 at 2:38 PM  ------   Message from: Asa Saunas   Created: 09/17/2004 at 1:42 PM   Regarding: blood test results   Contact: [phone]    Alejandra Martin [id number], 34 year old, female, [phone] (home) [phone] (work)      Cell phone:   Other phone:    Available times:    Patient's language of care: English    Patient does not need an interpreter.    Person calling on behalf of patient: patient (self)    Calls today for test result(s).

## 2004-09-17 NOTE — Telephone Encounter (Signed)
Please inform patient her cholesterol is not much different than it was the two other times we checked it.She can sctually have the numbers to compare. Please ask her to schedule an appointment to discuss treatment opotions and diet. Thank you

## 2004-09-19 NOTE — Telephone Encounter (Signed)
Staff Message copied by Barbarann Ehlers on 09/19/2004 at 3:03 PM  ------   Message from: Orson Gear   Created: 09/19/2004 at 1:30 PM   Regarding: regarding questions and concerns   Contact: [phone]    Alejandra Martin [id number], 34 year old, female, [phone] (home) [phone] (work)    Alejandra Martin NUMBER: [phone]  Cell phone:   Other phone:    Available times:    Patient's language of care: English    Patient does not need an interpreter.    Person calling on behalf of patient: patient (self)    Calls today with questions and concerns.

## 2004-09-19 NOTE — Telephone Encounter (Signed)
[name] to pt. She is looking for her blood work results. I informed her that her cholesterol is elevated and that she needs to schedule an appointment for a f/u. Appointment scheduled for next week.

## 2004-09-26 ENCOUNTER — Ambulatory Visit (HOSPITAL_BASED_OUTPATIENT_CLINIC_OR_DEPARTMENT_OTHER): Payer: Charity

## 2004-09-26 VITALS — BP 100/70 | Temp 100.0°F | Wt 197.0 lb

## 2004-09-26 DIAGNOSIS — F411 Generalized anxiety disorder: Secondary | ICD-10-CM

## 2004-09-26 DIAGNOSIS — F321 Major depressive disorder, single episode, moderate: Secondary | ICD-10-CM

## 2004-09-26 DIAGNOSIS — E7800 Pure hypercholesterolemia, unspecified: Secondary | ICD-10-CM

## 2004-09-29 NOTE — Progress Notes (Signed)
Alejandra Martin is a 34 year old female  Patient Active Problem List:   ALLERGY, UNSPECIFIED [995.3]   ACUTE GASTRITIS W/O HEMORRHAGE [535.00]   CLOSTRIDIUM DIFFICILE [008.45]   PURE HYPERCHOLESTEROLEM [272.0]   MAJ DEPRESS DISORDER, SING EPSISODE, MODERATE [296.22]    Current outpatient prescriptions:  FLUOXETINE HCL 20 MG OR CAPS, 1 CAPSULE EVERY MORNING, Disp: 30, Rfl: 2  ZYRTEC 10 MG OR TABS, 1 TABLET DAILY, Disp: 30, Rfl: 5  PROTONIX 40 MG OR TBEC, 1 TABLET BY MOUTH DAILY FOR 8 WEEKS, Disp: 56, Rfl: 0  FLONASE 50 MCG/DOSE NA INHA, two sprays to each nostril once a day , Disp: 1, Rfl: 5    Patient here for Chol f/u but she has multiple questions and concerns. She's here with her husband who also has an appt. With me. She's not f/u any diet, no exercise. She's seeing a therapist and cont. On Prozac; admits she worries about everything among them dz. Prevention, asks about multiple tests which could help detect this or the other problem. She's also trying to get pregnant, had a miscarriage last year. She just completed GI eval.. Allergies not a problem now.    BP 100/70   Temp (Src) 100 (Oral)   Wt 197 lbs (89.4kg)  Appearance: Overweight, anxious female in no apparent distress, well developed.    Lab Only Visit on 09/11/2004  Cholesterol (mg/dl) Date: 57/84/6962  Value: 259* Low: 0 High: 200 Status: Final   Comment: RESULT CONFIRMED BY REPEAT ANALYSIS  TRIGLYCERIDES (mg/dl) Date: 95/28/4132  Value: 70 Low: 0 High: 150 Status: Final  HDL (mg/dl) Date: 44/02/270  Value: 46 Low: 35 High: 85 Status: Final  LDL (mg/dl) Date: 53/66/4403  Value: 225* Low: 0 High: 100 Status: Final   Comment: RESULT CONFIRMED BY REPEAT ANALYSIS  RISK FACTOR Date: 09/11/2004  Value: 5.6* Low: High: 4.4 Status: Final     Formula used to calculate Risk = Chol/HDL      Relative Chol/HDL   CHD Risk Female Female   __________________________________________________________      1/2 Average 3.4 3.3   Average 5.0 4.4   2 X Average 10.0 7.0   3 X  Average 24.0 11.0  -----------    272.0 PURE HYPERCHOLESTEROLEM (primary encounter diagnosis)  Discussed diet, low chol, wt. Reduction and I've referred her to Nutritionist. I would hold on meds in view of her age, no other cardiac risk factors, pregnancy desirability.  Plan: REFERRAL TO NUTRITION (INT)       296.22 MAJ DEPRESS DISORDER, SING EPSISODE, MODERATEContinue Psych. F/u.    300.00 ANXIETY STATE NOS  I spent between 30-35 mins. Answering the patient's multiple questions, reassuring her. I believe she has a Somatization component and wishes to be reassured by ordering tests and I've told her that the most important thing she could do for her health is lifestyle modification with exercise, diet and losing weight!!

## 2004-10-01 ENCOUNTER — Encounter (HOSPITAL_BASED_OUTPATIENT_CLINIC_OR_DEPARTMENT_OTHER): Payer: Charity | Admitting: Gastroenterology

## 2004-10-01 DIAGNOSIS — Z800 Family history of malignant neoplasm of digestive organs: Secondary | ICD-10-CM

## 2004-10-01 DIAGNOSIS — K2971 Gastritis, unspecified, with bleeding: Secondary | ICD-10-CM

## 2004-10-01 DIAGNOSIS — K2991 Gastroduodenitis, unspecified, with bleeding: Principal | ICD-10-CM

## 2004-10-02 ENCOUNTER — Encounter (HOSPITAL_BASED_OUTPATIENT_CLINIC_OR_DEPARTMENT_OTHER): Payer: Charity | Admitting: Gastroenterology

## 2004-10-02 LAB — ~~LOC~~ MEDICAL SPECIALITIES

## 2004-10-03 ENCOUNTER — Ambulatory Visit (HOSPITAL_BASED_OUTPATIENT_CLINIC_OR_DEPARTMENT_OTHER): Payer: Self-pay

## 2004-10-04 ENCOUNTER — Encounter (HOSPITAL_BASED_OUTPATIENT_CLINIC_OR_DEPARTMENT_OTHER): Payer: Charity | Admitting: Gastroenterology

## 2004-10-07 ENCOUNTER — Ambulatory Visit (HOSPITAL_BASED_OUTPATIENT_CLINIC_OR_DEPARTMENT_OTHER): Payer: Charity

## 2004-10-07 ENCOUNTER — Ambulatory Visit (HOSPITAL_BASED_OUTPATIENT_CLINIC_OR_DEPARTMENT_OTHER): Payer: Charity | Admitting: Registered Nurse

## 2004-10-07 VITALS — BP 120/80 | Temp 99.2°F | Wt 196.0 lb

## 2004-10-07 DIAGNOSIS — F321 Major depressive disorder, single episode, moderate: Secondary | ICD-10-CM

## 2004-10-07 DIAGNOSIS — Z23 Encounter for immunization: Secondary | ICD-10-CM

## 2004-10-07 NOTE — Progress Notes (Signed)
PSYCHIATRY OUTPATIENT PROGRESS NOTE    VISIT TYPE: Psychopharmacology      INTERPRETER: No interpreter needed.    PROBLEMS which this visit addressed:   Problem 1: low energy/motivation    Problem 2: decreased interest    Problem 3: anxiety          SOURCE(S) OF INFORMATION: Patient     SUBJECTIVE FINDINGS: "I feel better, my mood is better and I'm not getting as uptight. I don't get nervous, I'm even cooking more."     Signs and symptoms: Pt now taking Prozac 20mg  daily, reports that she feels slightly fatigued after she takes it and also has been yawning more. Also reports dreaming more, but denies that it is disruptive to her sleep. Reports feeling less impatient/irritable, esp with husband. Less nervous overall, but still gets anxious several times per week. No panic attacks. Interest in social activities still low, did not participate in [place] like she usually does every year. Started back FT work at [employer], likes job, thinks being back to work is helping her to feel better. Concentration ok. Denies insomnia or frequent awakening, but does report that sleep is not always restful. Libido low. Energy level/motivation remain low, but she is feels it is improving. Cooking more lately. Not exercising, diet low in healthy fruits/vegetables, husband likes heavy/fattening meals.Denies any SI/HI. Still trying to get pregnant, going to see fertility specialist next week.       OBJECTIVE FINDINGS:   Pertinent positive and negative parts of mental status exam:   Appearance:Neatly dressed overweight female, looks stated age  Behavior: Pleasant and cooperative, good eye contact  Alertness: Alert and oriented x3, good concentration and attention  Speech: Normal rate, rhythm and volume  Mood: "still down, I'm not myself"  Affect: Full range, congruent with content of thought,   Thought Process: logical and linear, goal directed  Thought Content: depressive sx,somatically focused, inability to conceive,  supportive husband; no obsessions or delusions noted  Perceptions: Denies any A/V hallucinations or altered perceptions  Judgment/Impulse Control: Intact   Insight: Fair insight and knowledge about depression/anxiety  Cognition: Short term and remote memory intact, appears to have above average intelligence  Suicidal/Homicidal: Denies any SI/HI         Current medications (n/a for psychotherapy only visits):   Current outpatient prescriptions prior to 10/07/04:  FLUOXETINE HCL 20 MG OR CAPS, 1 CAPSULE EVERY MORNING, Disp: 30, Rfl: 2  ZYRTEC 10 MG OR TABS, 1 TABLET DAILY, Disp: 30, Rfl: 5  PROTONIX 40 MG OR TBEC, 1 TABLET BY MOUTH DAILY FOR 8 WEEKS, Disp: 56, Rfl: 0  FLONASE 50 MCG/DOSE NA INHA, two sprays to each nostril once a day , Disp: 1, Rfl: 5       Medications taken as prescribed (n/a for psychotherapy only visits): Yes    Medication side effects - including movement disorders/AIMS score (n/a for psychotherapy only visits): yawning, increase in dreams, mild fatigue    Testing results: No test results pending.     Risk behaviors: None reported.       ASSESSMENT:  Clinical formulation: Pt's anxiety and depression responding to fluoxetine, yet she remains somatically focused and anxious about her psychiatric symptoms, "I am going to go crazy." Asks lots of questions re how medication/depression affects the body. Needs much reassurance regarding her health/wellbeing. Would likely benefit from increase in Fluoxetine to 30mg , but wants to stay at this dose for another month before increasing b/c she worries about side effects.  Performing well at work, added structure and daily responsibility seem to be positive for pt. Limited interest/motivation contributing to decreased socialization.    Clinical interventions today and patient's response: Discussed symptoms, repsonse to medication and medication changes made today. Pt actively participated in conversations and agrees with plan.     Dual diagnosis stage of change:  No dual diagnosis    Medical necessity for today's visit: Medication mgt    Risk level per scale:   Suicide: low (1)   Violence: low (1)   Addiction: low (1)    DIAGNOSES:  Axis I (primary): Major Depressive Episode, Single Episode, Moderate  Axis II: deferred  Axis III: recent Hpylori  Axis IV: unemployed  Axis V (current): 55   Axis V (highest in past year): 75      PLAN: Continue with fluoxetine 20mg  daily. Discussed risk associated with medication and pregnancy, pt states she understands the risk and wishes to continue with medication despite no active contraception, will notify care team if she becomes pregnant. Discussed possible referral to psychotherapy to address long hx of anxiety, and recent onset of depression. Pt not interested at this time, but will consider in future, esp if she gets pregnant and chooses to come off medication. Discussed need for healthy diet and exercise, has appt with RD in 9/06. Will return for f/u in 4-6 weeks, will likely increase dose to 30mg  QD.     Risk plan (for patients at moderate/high risk for suicide/violence/addiction): Patient not at moderate or high risk.    Next visit: patient to be seen in 4-6 weeks     FOR PSYCHOPHARMACOLOGY VISITS ONLY  Pregnancy status: not preg    Medication plan: See plan     Medication education: We reviewed common side effects of SSRIs, including sexual dysfunction, weight gain, headache, jitteriness, sedation, GI disturbance and increased sweating. We also explained that these medications should not be abruptly discontinued, as that could yield a number of symptoms, including lightheadedness, headache, GI disturbance, emotional lability, electric sensations and insomnia that could be uncomfortable and persist for days or weeks. We also reviewed the rare possibility that an increase in serotonin can cause unusual scary thoughts and images, at times leading to suicidal feelings. The patient understands that they should report such symptoms  urgently to me, to the clinic, or to the [facility] Emergency Service [phone]), or to his/her nearest Emergency Room, for urgent assistance. We also discussed the possibility that taking an antidepressant could precipitate symptoms of mania or hypomania, and that it is very important to immediately report any unusual increase in energy or activity the patient notices or that others bring to his/her attention.     Medical work-up plan/testing: None indicated.    Instructions to covering prescriber: OK to re-fill if patient needs meds     Amount of time spent w/patient today: 30 min     Aston Lawhorn Ellard Artis, RNCS

## 2004-10-07 NOTE — Nursing Note (Signed)
>>   Alejandra Martin     10/07/2004   4:36 pm  Pt here for Hep B #3, Already rec'd #1 and 2 from Occupational health when she was working for [employer]. Hep B # 3 lot # [id number] exp 10/24/05 to left deltoid without difficulty.

## 2004-10-10 ENCOUNTER — Telehealth (HOSPITAL_BASED_OUTPATIENT_CLINIC_OR_DEPARTMENT_OTHER): Payer: Self-pay | Admitting: Registered Nurse

## 2004-10-10 ENCOUNTER — Other Ambulatory Visit (HOSPITAL_BASED_OUTPATIENT_CLINIC_OR_DEPARTMENT_OTHER): Payer: Charity | Admitting: Lab

## 2004-10-10 DIAGNOSIS — R197 Diarrhea, unspecified: Secondary | ICD-10-CM

## 2004-10-10 NOTE — Telephone Encounter (Signed)
Staff Message copied by Barbarann Ehlers on 10/10/2004 at 12:49 PM  ------   Message from: [name]   Created: 10/09/2004 at 3:56 PM   Regarding: Medication questions   Contact: [phone]    Ermel Verne [id number], 34 year old, female, [phone] (home) [phone] (work)    [name] NUMBER: [phone]  Cell phone:   Other phone:    Available times:    Patient's language of care: English    Patient does not need an interpreter.    Person calling on behalf of patient: patient (self)    Calls today to speak to provider only.The patient has a question on her medications for Dr. Delora Fuel.  Please call to advise.  Thanks-mlo

## 2004-10-10 NOTE — Nursing Note (Signed)
>>   Alejandra Martin, Alejandra Martin     10/10/2004   4:34 pm  Labs completed

## 2004-10-10 NOTE — Telephone Encounter (Signed)
To provider for review.

## 2004-10-11 ENCOUNTER — Encounter (HOSPITAL_BASED_OUTPATIENT_CLINIC_OR_DEPARTMENT_OTHER): Payer: Self-pay | Admitting: Family

## 2004-10-11 LAB — OCCULT BLOOD: OCCULT BLOOD: NEGATIVE

## 2004-10-11 LAB — HELICOBACTER PYLORI STOOL AG

## 2004-10-14 LAB — CLOSTRIDIUM DIFF TOXIN A

## 2004-10-16 ENCOUNTER — Telehealth (HOSPITAL_BASED_OUTPATIENT_CLINIC_OR_DEPARTMENT_OTHER): Payer: Self-pay

## 2004-10-16 NOTE — Telephone Encounter (Signed)
Pt left message asking me to call her back,she had a question. Pt stating she has more energy some days than others and is having trouble falling asleep some nights, asking if that is normal. I educated patient on normal transient side effects that are associated with the initiation of SSRI's, and on symptoms of depression. Pt started fluoxetine on 7/17, still in the initiation period. Pt remains somatic and nervous about medications (not just her SSRI). I provided reassurance and urged pt to call clinic if sx or SE increase. Appt confirmed for 9/25.

## 2004-10-16 NOTE — Telephone Encounter (Signed)
Patient's progress note reports anxiety increasing  Please ask her to schedule an appointment

## 2004-11-11 ENCOUNTER — Ambulatory Visit (HOSPITAL_BASED_OUTPATIENT_CLINIC_OR_DEPARTMENT_OTHER): Payer: Charity

## 2004-11-14 ENCOUNTER — Encounter (HOSPITAL_BASED_OUTPATIENT_CLINIC_OR_DEPARTMENT_OTHER): Payer: Charity | Admitting: Registered"

## 2004-11-15 ENCOUNTER — Telehealth (HOSPITAL_BASED_OUTPATIENT_CLINIC_OR_DEPARTMENT_OTHER): Payer: Self-pay

## 2004-11-15 DIAGNOSIS — F321 Major depressive disorder, single episode, moderate: Secondary | ICD-10-CM

## 2004-11-15 MED ORDER — CITALOPRAM HYDROBROMIDE 20 MG PO TABS
ORAL_TABLET | ORAL | Status: DC
Start: 2004-11-15 — End: 2004-11-29

## 2004-11-15 NOTE — Telephone Encounter (Signed)
Returning pt's message. Pt reports she stopped fluoxetine 4 days ago b/c it was "making her feel tired during the day." States this is most days, x the past 2-3 weeks. She missed her appt with me MOnday, thought is was this coming Monday. States she DID NOT get reminder call. Reports that she did feel less depressed/anxious on fluoxetine, but not sure she wants to keep taking if it is going to make her feel tired. Open to trying another med. I discussed the importance of NOT stopping/changing medications without consulting provider first. Pt continues to need clear and direct information r/t depression/medications and she is very somatically focused and has a limited understanding of depression/anxiety. Will send in new script for citalopram 20mg  QD to [facility].Pt aware, we discussed the new medication at length. Will add on pt to 10/23 schedule for f/u. Pt is aware I will out of town next week Wed-Fri, encouraged to call me prior to that with any questions/concerns, or to call [facility] care team while I am away.    We reviewed common side effects of SSRIs, including sexual dysfunction, weight gain, headache, jitteriness, sedation, GI disturbance and increased sweating. We also explained that these medications should not be abruptly discontinued, as that could yield a number of symptoms, including lightheadedness, headache, GI disturbance, emotional lability, electric sensations and insomnia that could be uncomfortable and persist for days or weeks. We also reviewed the rare possibility that an increase in serotonin can cause unusual scary thoughts and images, at times leading to suicidal feelings. The patient understands that they should report such symptoms urgently to me, to the clinic, or to the Psychiatric Emergency Service [phone]), or to his/her nearest Emergency Room, for urgent assistance. We also discussed the possibility that taking an antidepressant could precipitate symptoms of mania or  hypomania, and that it is very important to immediately report any unusual increase in energy or activity the patient notices or that others bring to his/her attention.

## 2004-11-19 ENCOUNTER — Ambulatory Visit: Payer: Self-pay | Admitting: Dentist

## 2004-11-19 DIAGNOSIS — Z0120 Encounter for dental examination and cleaning without abnormal findings: Secondary | ICD-10-CM

## 2004-11-29 ENCOUNTER — Ambulatory Visit (HOSPITAL_BASED_OUTPATIENT_CLINIC_OR_DEPARTMENT_OTHER): Payer: Charity

## 2004-11-29 VITALS — BP 120/80 | HR 70 | Temp 98.1°F | Resp 12 | Ht 62.0 in | Wt 189.0 lb

## 2004-11-29 DIAGNOSIS — T7840XA Allergy, unspecified, initial encounter: Secondary | ICD-10-CM

## 2004-11-29 DIAGNOSIS — E7800 Pure hypercholesterolemia, unspecified: Secondary | ICD-10-CM

## 2004-11-29 DIAGNOSIS — K2900 Acute gastritis without bleeding: Secondary | ICD-10-CM

## 2004-11-29 DIAGNOSIS — F321 Major depressive disorder, single episode, moderate: Secondary | ICD-10-CM

## 2004-11-29 LAB — BLOOD COUNT COMPLETE AUTOMATED
HEMATOCRIT: 37.2 % (ref 37.0–47.0)
HEMOGLOBIN: 12.5 g/dL (ref 12.0–16.0)
MEAN CORP HGB CONC: 33.6 g/dL (ref 32.0–36.0)
MEAN CORPUSCULAR HGB: 30.5 pg (ref 27.0–31.0)
MEAN CORPUSCULAR VOL: 90.7 fL (ref 81.0–99.0)
MEAN PLATELET VOLUME: 8.6 fL (ref 6.4–10.8)
PLATELET COUNT: 286 10*3/uL (ref 150–400)
RBC DISTRIBUTION WIDTH: 12.4 % (ref 11.5–14.3)
RED BLOOD CELL COUNT: 4.1 MIL/uL — ABNORMAL LOW (ref 4.20–5.40)
WHITE BLOOD CELL COUNT: 8.3 10*3/uL (ref 4.8–10.8)

## 2004-11-29 MED ORDER — PROTONIX 40 MG PO TBEC
DELAYED_RELEASE_TABLET | ORAL | Status: AC
Start: 2004-11-29 — End: 2005-04-29

## 2004-11-29 NOTE — Progress Notes (Signed)
Alejandra Martin is a 35 year old female  Patient Active Problem List:   ALLERGY, UNSPECIFIED [995.3]   ACUTE GASTRITIS W/O HEMORRHAGE [535.00]   CLOSTRIDIUM DIFFICILE [008.45]   PURE HYPERCHOLESTEROLEM [272.0]   MAJ DEPRESS DISORDER, SING EPSISODE, MODERATE [296.22]    Current outpatient prescriptions:  PROTONIX 40 MG OR TBEC, 1 TABLET BY MOUTH DAILY IN AM, Disp: 30, Rfl: 5  ZYRTEC 10 MG OR TABS, 1 TABLET DAILY, Disp: 30, Rfl: 5  CITALOPRAM HYDROBROMIDE 20 MG OR TABS, 1/2 TABLET DAILY, Disp: 30, Rfl: 1    Current outpatient prescriptions:  PROTONIX 40 MG OR TBEC, 1 TABLET BY MOUTH DAILY IN AM, Disp: 30, Rfl: 5  ZYRTEC 10 MG OR TABS, 1 TABLET DAILY, Disp: 30, Rfl: 5  CITALOPRAM HYDROBROMIDE 20 MG OR TABS, 1/2 TABLET DAILY, Disp: 30, Rfl: 1    Patient here for med. F/u and eval. Of concerns about drug interactions, easy bruising, back pain.  She switched from Prozac to Celexa but felt too tired, fatigued, low energy and stopped after 2 days. It's now around 2 weeks without meds and she says she's ok, worrying a lot. Upon reviewing meds, she's taking Zyrtec in the AM which makes people sleepy and I've asked her to switch to PM. She admits that her dreams are very vivid, worse when she took Prozac. Still has them but not as often.  STILL WITH ITCHY EYES, head congestion but better with Zyrtec  Has some bruising, points to ? New freckles in arms and is concerned abut why she's getting these things now.    BP 120/80   Pulse 70   Temp (Src) 98.1 (Oral)   Resp 12   Ht 5\' 2"  (1.30m)   Wt 189 lbs (85.7kg)  Eyes- Clear conjunctiva and sclerae.  Skin- No suspicious skin lesions observed. Areas of concern for bruising are where multiple smmall capillaries are seen, in arm freckles, light brown, uniform colored.    296.22 MAJ DEPRESS DISORDER, SING EPSISODE, MODERATE (primary encounter diagnosis)  Rec. To re-try Celexa at 10 mg dose for 2 weeks and f/u with Psych. She has a strong somatic component.  Plan: CITALOPRAM HYDROBROMIDE  20 MG OR TABS, PROTONIX   40 MG OR TBEC       535.00 ACUTE GASTRITIS W/O HEMORRHAGE  Doing ok on med; wt. Loss encouraged!  Plan: COMPLETE CBC, AUTOMATED       995.3 ALLERGY, UNSPECIFIED  Zyrtec in PM    272.0 PURE HYPERCHOLESTEROLEM  I told her that it was too soon to repeat test and to f/u in 3-4 months.  Plan: ROUTINE VENIPUNCTURE, LIPID PANEL, TRANSFERASE    (AST) (SGOT), ALANINE AMINO (ALT) (SGPT)     No skin pathology identified and I reassured the patient. At her request and to lessen her anxiety, repeat CBC.  As I walked out the door after finishing the visit, she complained about LBP. She's had it before, agreed to recheck at next visit. Rec. Ice/heat packs, Advil prn.

## 2004-12-06 ENCOUNTER — Telehealth (HOSPITAL_BASED_OUTPATIENT_CLINIC_OR_DEPARTMENT_OTHER): Payer: Self-pay | Admitting: Registered Nurse

## 2004-12-06 NOTE — Telephone Encounter (Signed)
[name] to pt. No answer. Left message on v-mail that I was returning her call.

## 2004-12-06 NOTE — Telephone Encounter (Signed)
Staff Message copied by Barbarann Ehlers on 12/06/2004 at 3:22 PM  ------   Message from: [name]   Created: 12/06/2004 at 12:45 PM   Regarding: Question   Contact: [phone]    Alejandra Martin [id number], 34 year old, female, [phone] (home) [phone] (work)    [name] NUMBER: [phone] until 3PM  Cell phone:   Other phone:    Available times:    Patient's language of care: English    Patient does not need an interpreter.    Patient's PCP: [name] MD, MD    Person calling on behalf of patient: patient (self)    Calls today with questions and concerns.The patient has a question for the nurse.Please call.  Thanks-mlo

## 2004-12-09 ENCOUNTER — Ambulatory Visit (HOSPITAL_BASED_OUTPATIENT_CLINIC_OR_DEPARTMENT_OTHER): Payer: Charity

## 2004-12-09 ENCOUNTER — Ambulatory Visit (HOSPITAL_BASED_OUTPATIENT_CLINIC_OR_DEPARTMENT_OTHER): Payer: Self-pay

## 2004-12-09 DIAGNOSIS — F411 Generalized anxiety disorder: Secondary | ICD-10-CM

## 2004-12-09 NOTE — Telephone Encounter (Signed)
Staff Message copied by Barbarann Ehlers on 12/09/2004 at 2:43 PM  ------   Message from: Marita Kansas   Created: 12/09/2004 at 1:29 PM   Regarding: sick call    Alejandra Martin [id number], 34 year old, female, [phone] (home) [phone] (work)    CALL BACK NUMBER: home before 3pm  Cell phone:   Other phone:    Available times:    Patient's language of care: English    Patient does not need an interpreter.    Patient's PCP: [name] MD, MD    Person calling on behalf of patient: patient (self)    Calls today with a sick call.  Rectal pain and some bleeding. GI specialist told pt to see PCP today.

## 2004-12-09 NOTE — Progress Notes (Signed)
PSYCHIATRY OUTPATIENT PROGRESS NOTE      VISIT TYPE: Psychopharmacology      INTERPRETER: No interpreter needed.      PROBLEMS which this visit addressed:   Problem 1: anxiety     Problem 2: low energy        SOURCE(S) OF INFORMATION: Patient      SUBJECTIVE FINDINGS: "I just feel like the medication makes me tired, so I stopped it again."     Signs and symptoms: Pt restarted citalopram 10mg  q evening on 11/29/04 after visit with PCP. Took for three days and then stopped again after three days. Reports feeling like the medication was making her feel tired and increasing the intensity of her dreams/disrupting her sleep. Reports that lately she has felt less "down" and is "forcing" herself to stay off the couch. But, still c/o low energy and daily fatigue. Feels anxious and nervous, worries about "everything." Reports low anxiety at work when she is distracted/busy.Denies any panic sx. 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 Appetite fair, walking now to/from work but otherwise no physical activity. Minimal interest in social activities, but is pushing herself to get out more. Libido remains low. Concentration fair. Reports that intensity of dreams has decreased since stopping citalopram, but still has dreams nightly (her baseline). Denies early insomnia or frequent awakening now that dreams have subsided., but does report that sleep is not always restful. Libido low. Denies any SI/HI. Still trying to get pregnant, recently began taking medication from [facility] to increase ovulation-pt unable to recall name of medication. No current birth control method.      OBJECTIVE FINDINGS:   Pertinent positive and negative parts of mental status exam:   Appearance:Neatly dressed overweight female, looks stated age   Behavior: Pleasant and cooperative, good eye contact   Alertness: Alert and oriented x3, good concentration and attention   Speech: Normal rate, rhythm and volume   Mood: "a little better, but  i'm still anxious"   Affect:Intense, full range, congruent with content of thought,   Thought Process: logical and linear, goal directed   Thought Content: focused on medication, has back spasms that bother her at work, somatically focused, inability to conceive, supportive husband; no obsessions or delusions noted   Perceptions: Denies any A/V hallucinations or altered perceptions   Judgment/Impulse Control: Intact   Insight: Limited insight and knowledge about depression/anxiety, psychopharmacology  Cognition: Short term and remote memory intact,difficulty abstracting   Suicidal/Homicidal: Denies any SI/HI            Current medications (n/a for psychotherapy only visits):   Current outpatient prescriptions prior to 10/07/04:   ZYRTEC 10 MG OR TABS, 1 TABLET DAILY, Disp: 30, Rfl: 5   PROTONIX 40 MG OR TBEC, 1 TABLET BY MOUTH DAILY FOR 8 WEEKS, Disp: 56, Rfl: 0   FLONASE 50 MCG/DOSE NA INHA, two sprays to each nostril once a day , Disp: 1, Rfl: 5         Medications taken as prescribed (n/a for psychotherapy only visits): Yes      Medication side effects - including movement disorders/AIMS score (n/a for psychotherapy only visits): increase in dreams, mild fatigue, ?? If fatigue is a SE of sx of her depression/anxiety     Testing results: No test results pending.      Risk behaviors: None reported.         ASSESSMENT:   Clinical formulation: Pt highly anxious about citalopram, depression, anxiety and ger general health.  Very ruminative and somatically focused. Presents as very unsure about taking citalopram, and possibly unable to tolerate intital SE that can accompany SSRIs making it difficult to reach an adequate trial of med or to determine if medication is alleviating sx.Based on this, it may not be realistic that she will take medications as ordered for necessary duration. She is also trying to conceive, she is aware of the risks associated of SSRIs during pregnancy-reports she would stop medication immediately  if she became pregnant. She is not currently open to therapy. Needs much reassurance regarding her health/wellbeing and concrete explanations of depression/anxiety and their treatments.Pt current sx suggests GAD vs. MDD.     Clinical interventions today and patient's response: Discussed symptoms, repsonse to medication and medication changes made today. Pt actively participated in conversations and agrees with plan.      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 Reviewed initial SSRI SE with patient and rationale for taking consistently for adequate time period to evaluate efficacy. Also reviewed rational for not stopping med abruptly. We reviewed risks associated with citalopram and pregnancy, pt states she understands the risk and wishes to continue with medication despite no active contraception, will notify care team if she becomes pregnant. Pt will return for f/u in 4-6 weeks, call clinic if she experiences intolerale S or has further questions. Pt also plans to look into gym membership and continue efforts to improve diet and increase physical activity.      Risk plan (for patients at moderate/high risk for suicide/violence/addiction): Patient not at moderate or high risk.      Next visit: patient to be seen in 4-6 weeks      FOR PSYCHOPHARMACOLOGY VISITS ONLY   Pregnancy status: not preg      Medication plan: See plan      Medication education: We reviewed common side effects of SSRIs, including sexual dysfunction, weight gain, headache, jitteriness, sedation, GI  disturbance and increased sweating. We also explained that these medications should not be abruptly discontinued, as that could yield a number of symptoms, including lightheadedness, headache, GI disturbance, emotional lability, electric sensations and insomnia that could be uncomfortable and persist for days or weeks. We also reviewed the rare possibility that an increase in serotonin can cause unusual scary thoughts and images, at times leading to suicidal feelings. The patient understands that they should report such symptoms urgently to me, to the clinic, or to the Psychiatric Emergency Service [phone]), or to his/her nearest Emergency Room, for urgent assistance. We also discussed the possibility that taking an antidepressant could precipitate symptoms of mania or hypomania, and that it is very important to immediately report any unusual increase in energy or activity the patient notices or that others bring to his/her attention.      Medical work-up plan/testing: None indicated.      Instructions to covering prescriber: OK  to re-fill if patient needs meds      Amount of time spent w/patient today: 30 min      Adama Ivins Ellard Artis, RNCS

## 2004-12-09 NOTE — Telephone Encounter (Signed)
[name] To pt. She states that all last week she had some rectal bleeding (small amount) when she moved her bowels. Now has rectal discomfort. She called Dr. Suzie Portela but they are fully booked. Appointment scheduled for 12/10/04 with Dr. Delora Fuel. Pt will call Dr. Suzie Portela again to see if they can see her sooner.

## 2004-12-10 ENCOUNTER — Encounter (HOSPITAL_BASED_OUTPATIENT_CLINIC_OR_DEPARTMENT_OTHER): Payer: Charity

## 2004-12-12 ENCOUNTER — Encounter (HOSPITAL_BASED_OUTPATIENT_CLINIC_OR_DEPARTMENT_OTHER): Payer: Self-pay

## 2004-12-17 ENCOUNTER — Ambulatory Visit (HOSPITAL_BASED_OUTPATIENT_CLINIC_OR_DEPARTMENT_OTHER): Payer: Self-pay | Admitting: Dentistry

## 2004-12-17 DIAGNOSIS — Z0120 Encounter for dental examination and cleaning without abnormal findings: Secondary | ICD-10-CM

## 2004-12-24 ENCOUNTER — Encounter (HOSPITAL_BASED_OUTPATIENT_CLINIC_OR_DEPARTMENT_OTHER): Payer: Self-pay | Admitting: Gastroenterology

## 2004-12-24 DIAGNOSIS — K649 Unspecified hemorrhoids: Secondary | ICD-10-CM

## 2004-12-25 LAB — ~~LOC~~ MEDICAL SPECIALITIES

## 2005-01-07 ENCOUNTER — Ambulatory Visit (HOSPITAL_BASED_OUTPATIENT_CLINIC_OR_DEPARTMENT_OTHER): Payer: Self-pay | Admitting: Gastroenterology

## 2005-01-07 DIAGNOSIS — K649 Unspecified hemorrhoids: Secondary | ICD-10-CM

## 2005-01-08 LAB — ~~LOC~~ MEDICAL SPECIALITIES

## 2005-01-08 LAB — HELICOBACTER PYLORI STOOL AG

## 2005-01-20 ENCOUNTER — Ambulatory Visit (HOSPITAL_BASED_OUTPATIENT_CLINIC_OR_DEPARTMENT_OTHER): Payer: Self-pay

## 2005-01-28 ENCOUNTER — Ambulatory Visit (HOSPITAL_BASED_OUTPATIENT_CLINIC_OR_DEPARTMENT_OTHER): Payer: Self-pay | Admitting: Gastroenterology

## 2005-02-04 ENCOUNTER — Ambulatory Visit (HOSPITAL_BASED_OUTPATIENT_CLINIC_OR_DEPARTMENT_OTHER): Payer: Self-pay | Admitting: Gastroenterology

## 2005-02-18 ENCOUNTER — Other Ambulatory Visit (HOSPITAL_BASED_OUTPATIENT_CLINIC_OR_DEPARTMENT_OTHER): Payer: Self-pay | Admitting: Registered Nurse

## 2005-02-18 DIAGNOSIS — F321 Major depressive disorder, single episode, moderate: Secondary | ICD-10-CM

## 2005-02-18 MED ORDER — CITALOPRAM HYDROBROMIDE 20 MG PO TABS
ORAL_TABLET | ORAL | Status: DC
Start: 2005-02-18 — End: 2005-10-27

## 2005-02-18 NOTE — Telephone Encounter (Signed)
Staff Message copied by Barbarann Ehlers on 02/18/2005 at 10:06 AM  ------   Message from: Asa Saunas   Created: 02/14/2005 at 4:53 PM   Regarding: pt needs anti-depressing 20mg  @ [facility] [phone]   Contact: [phone]    Esma Kilts [id number], 35 year old, female, [phone] (home) [phone] (work)    CALL BACK NUMBER:   Cell phone:   Other phone:    Available times:    Patient's language of care: English    Patient does not need an interpreter.    Patient's PCP: [name] MD, MD    Person calling on behalf of patient: patient (self)    Calls today for med refill(s). Pt no longer have free care will need to call in at [facility]

## 2005-02-18 NOTE — Telephone Encounter (Signed)
To provider for review.

## 2005-02-18 NOTE — Telephone Encounter (Signed)
Rx faxed to [facility]

## 2005-02-18 NOTE — Telephone Encounter (Signed)
Staff Message copied by Barbarann Ehlers on 02/18/2005 at 10:06 AM  ------   Message from: [name]   Created: 02/14/2005 at 1:08 PM   Regarding: Prescription needed/questions   Contact: [phone]    Alejandra Martin [id number], 35 year old, female, [phone] (home) [phone] (work)    [name] NUMBER: [phone]  Cell phone:   Other phone:    Available times:    Patient's language of care: English    Patient does not need an interpreter.    Patient's PCP: [name] MD, MD    Person calling on behalf of patient: patient (self)    Calls today for med refill(s).The patient needs a refill of Citalopram.She would like to speak with a nurse or Dr. Delora Fuel.Please call.  Thanks-mlo

## 2005-03-10 ENCOUNTER — Ambulatory Visit (HOSPITAL_BASED_OUTPATIENT_CLINIC_OR_DEPARTMENT_OTHER): Payer: Charity

## 2005-03-10 DIAGNOSIS — F321 Major depressive disorder, single episode, moderate: Secondary | ICD-10-CM

## 2005-03-10 DIAGNOSIS — F411 Generalized anxiety disorder: Secondary | ICD-10-CM

## 2005-03-10 NOTE — Progress Notes (Signed)
PSYCHIATRY OUTPATIENT PROGRESS NOTE      VISIT TYPE: Psychopharmacology      INTERPRETER: No interpreter needed.      PROBLEMS which this visit addressed:   Problem 1: anxiety      Problem 2: low energy         SOURCE(S) OF INFORMATION: Patient      SUBJECTIVE FINDINGS: "I'm back on the Celexa. I'm still not feeling great."      Signs and symptoms: Pt restarted citalopram 10mg  q evening early December. Missed a week around Xmas b/c she ran out and was without insurance, but resumed med again 2 weeks ago. Feels that medicine is helping some with anxiety, but continues to complain of low energy/motivation, feeling down and sad, and decreased libido. Reports medication has increased the intensity of her dreams, but isn't particularly disruptive. ABle to fall asleep at night. Reports lower anxiety at work when she is distracted/busy.Denies any panic sx. 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 Husband supportive of low libido. Walking now to/from work but otherwise no physical activity. Minimal interest in social activities. Denies any SI/HI. Still trying to get pregnant, reftility efforts on hold until she can get back onto insurance. No current birth control method.      OBJECTIVE FINDINGS:   Pertinent positive and negative parts of mental status exam:   Appearance:Neatly dressed overweight female, looks stated age   Behavior: Pleasant and cooperative, good eye contact   Alertness: Alert and oriented x3, good concentration and attention   Speech: Normal rate, rhythm and volume   Mood: "I'm still down, this weather isn't helping"   Affect:Intense, full range, congruent with content of thought,   Thought Process: logical and linear, goal directed   Thought Content: wants to get back on inusrance, frustrated with low energy level,inability to conceive, supportive husband; no obsessions or delusions noted   Perceptions: Denies any A/V hallucinations or altered perceptions   Judgment/Impulse  Control: Intact   Insight: Limited insight and knowledge about depression/anxiety, psychopharmacology   Cognition: Short term and remote memory intact,difficulty abstracting    Suicidal/Homicidal: Denies any SI/HI            Current medications (n/a for psychotherapy only visits):   Current outpatient prescriptions prior to 10/07/04:   ZYRTEC 10 MG OR TABS, 1 TABLET DAILY, Disp: 30, Rfl: 5   PROTONIX 40 MG OR TBEC, 1 TABLET BY MOUTH DAILY FOR 8 WEEKS, Disp: 56, Rfl: 0   FLONASE 50 MCG/DOSE NA INHA, two sprays to each nostril once a day , Disp: 1, Rfl: 5         Medications taken as prescribed (n/a for psychotherapy only visits): Yes      Medication side effects - including movement disorders/AIMS score (n/a for psychotherapy only visits): increase in dreams, mild daytime fatigue, ?? If fatigue is a SE of sx of her depression/anxiety      Testing results: No test results pending.      Risk behaviors: None reported.         ASSESSMENT:   Clinical formulation: Pt continues to have difficulty taking medication regularly and still has not completed a continuous trial of SSRI for atleast 6weeks. Was less somatic today, able to more clearly articulate her psych sx and feelings about not being able to conceive. Seems more sure about taking citalopram today, willing to increase dose to 20mg . She is also trying to conceive, she is aware of the risks associated of SSRIs  during pregnancy-reports she would stop medication immediately if she became pregnant. Pt current sx suggests GAD and MDD.      Clinical interventions today and patient's response: Discussed symptoms, repsonse to medication and medication changes made today. Pt actively participated in conversations and agrees with plan.      Dual diagnosis stage of change: No dual diagnosis      Medical necessity for today's visit: Medication mgt      Risk level per scale:    Suicide: low (1)    Violence: low (1)    Addiction: low (1)      DIAGNOSES:   Axis I (primary): Major  Depressive Episode, Single Episode, Moderate   Axis I: GAD   Axis II: deferred   Axis III: Hpylori   Axis IV: unemployed   Axis V (current): 55   Axis V (highest in past year): 75         PLAN: Increase citalopram to 20 mg daily. Reviewed SSRI SE with patient and rationale for taking consistently for adequate time period to evaluate efficacy. Also reviewed rational for not stopping med abruptly. We reviewed risks associated with citalopram and pregnancy, pt states she understands the risk and wishes to continue with medication despite no active contraception, will notify care team if she becomes pregnant. Pt will return for f/u on 2/26, call clinic if she experiences intolerale SE or has further questions. Pt also plans to continue efforts to improve diet and increase physical activity.      Risk plan (for patients at moderate/high risk for suicide/violence/addiction): Patient not at moderate or high risk.      FOR PSYCHOPHARMACOLOGY VISITS ONLY   Pregnancy status: not preg      Medication plan: See plan      Medication education: We reviewed common side effects of SSRIs, including sexual dysfunction, weight gain, headache, jitteriness, sedation, GI disturbance and increased sweating. We also explained that these medications should not be abruptly discontinued, as that could yield a number of symptoms, including lightheadedness, headache, GI disturbance, emotional lability, electric sensations and insomnia that could be uncomfortable and persist for days or weeks. We also reviewed the rare possibility that an increase in serotonin can cause unusual scary thoughts and images, at times leading to suicidal feelings. The patient understands that they should report such symptoms urgently to me, to the clinic, or to the Psychiatric Emergency Service [phone]), or to his/her nearest Emergency Room, for urgent assistance. We also discussed the possibility that taking an antidepressant could precipitate symptoms of  mania or hypomania, and that it is very important to immediately report any unusual increase in energy or activity the patient notices or that others bring to his/her attention.      Medical work-up plan/testing: None indicated.      Instructions to covering prescriber: OK to re-fill if patient needs meds      Amount of time spent w/patient today: 30 min      Lavi Sheehan Ellard Artis, RNCS

## 2005-04-14 ENCOUNTER — Ambulatory Visit: Payer: Self-pay | Admitting: Dentist

## 2005-04-14 ENCOUNTER — Ambulatory Visit (HOSPITAL_BASED_OUTPATIENT_CLINIC_OR_DEPARTMENT_OTHER): Payer: Charity

## 2005-04-14 DIAGNOSIS — K029 Dental caries, unspecified: Secondary | ICD-10-CM

## 2005-04-25 ENCOUNTER — Telehealth (HOSPITAL_BASED_OUTPATIENT_CLINIC_OR_DEPARTMENT_OTHER): Payer: Self-pay | Admitting: Registered Nurse

## 2005-04-25 NOTE — Telephone Encounter (Signed)
Staff Message copied by Barbarann Ehlers on 04/25/2005 at 4:15 PM  ------   Message from: Jeanella Cara   Created: 04/25/2005 at 3:49 PM   Regarding: Question for nurse   Contact: [phone]    Alejandra Martin [id number], 35 year old, female, [phone] (home) [phone] (work)    [name] NUMBER: [phone]  Cell phone:   Other phone:    Available times:    Patient's language of care: English    Patient does not need an interpreter.    Patient's PCP: [name] MD, MD    Person calling on behalf of patient: patient (self)    Calls today Would like for Barbarann Ehlers to call her back. Has a question. Please call. Thank you

## 2005-04-26 ENCOUNTER — Emergency Department: Payer: Self-pay | Admitting: Emergency Medicine

## 2005-04-27 LAB — CT NECK SOFT TISSUE WO CONTRAST

## 2005-04-27 LAB — XR NECK SOFT TISSUE

## 2005-04-29 ENCOUNTER — Ambulatory Visit: Payer: Self-pay

## 2005-04-29 DIAGNOSIS — K0500 Acute gingivitis, plaque induced: Secondary | ICD-10-CM

## 2005-05-01 ENCOUNTER — Telehealth (HOSPITAL_BASED_OUTPATIENT_CLINIC_OR_DEPARTMENT_OTHER): Payer: Self-pay

## 2005-05-01 LAB — EMERGENCY ROOM NOTE

## 2005-05-01 NOTE — Telephone Encounter (Signed)
Pt called with questions. STopped taking her citalopram for 4 days recently b/c she was given PCN after having a fish bone removed from her throat in ER. SHe did not think she could take PCN with citalopram.She noticed an increase in anxiety, feeling more depressed and decrease in energy/interest. Restarted meds two days ago and is having mild HA. LIkely due to stopping/restarting medication, should resolve with time, can use Tylenol/Motrin in the meantime. Also reported that she changes her dosing times from day to day, sometimes takes at night, sometimes takes in am. Informed pt that medication needs to be taken the same time daily. Pt also reports some anorgasmia, not clear if this started before/after starting citalopram. Asked pt to reschedule with me ASAP to discuss further. FUnctioning well, working FT. Sleeping fairly well. Denies any SI/HI.

## 2005-05-27 NOTE — Telephone Encounter (Signed)
Multiple attempts to contact pt. I have not been able to reach her. Encounter closed.

## 2005-06-27 ENCOUNTER — Telehealth (HOSPITAL_BASED_OUTPATIENT_CLINIC_OR_DEPARTMENT_OTHER): Payer: Self-pay

## 2005-06-27 NOTE — Telephone Encounter (Signed)
Pt states that she took a pregnancy test last week and it was negative and she took on this week and it was positive. LMP was February 2007. Pt wanted to come and have blood work done. Pt was given an appt for Monday. Pt was instructed that if she had some cramping, abd pain or bleeding before this appt to go to ER.

## 2005-06-27 NOTE — Telephone Encounter (Signed)
Staff Message copied by Odis Luster on 06/27/2005 at 1:15 PM  ------   Message from: Jeanella Cara   Created: 06/27/2005 at 1:06 PM   Regarding: Positive preg test   Contact: [phone]    Alejandra Martin [id number], 35 year old, female, [phone] (home) [phone] (work)    Alejandra Martin NUMBER: [phone]  Cell phone:   Other phone:    Available times:    Patient's language of care: English    Patient does not need an interpreter.    Patient's PCP: [name] MD, MD    Person calling on behalf of patient: patient (self)    Calls today patient took pregnancy test. Last week came out negative this week came out positive. Patient is feeling cramps. Would like to know if she can come in for some blood work. Please call. Thank you    Patient's Preferred Pharmacy:   [facility]  Phone: [phone] Fax: [phone]    Alejandra Martin  Phone: [phone] Fax: [phone]

## 2005-06-30 ENCOUNTER — Encounter (HOSPITAL_BASED_OUTPATIENT_CLINIC_OR_DEPARTMENT_OTHER): Payer: Self-pay

## 2005-06-30 ENCOUNTER — Ambulatory Visit (HOSPITAL_BASED_OUTPATIENT_CLINIC_OR_DEPARTMENT_OTHER): Payer: Charity

## 2005-06-30 VITALS — BP 120/80 | HR 70 | Temp 99.0°F | Wt 206.0 lb

## 2005-06-30 DIAGNOSIS — N925 Other specified irregular menstruation: Secondary | ICD-10-CM

## 2005-06-30 DIAGNOSIS — Z3201 Encounter for pregnancy test, result positive: Secondary | ICD-10-CM

## 2005-06-30 DIAGNOSIS — N949 Unspecified condition associated with female genital organs and menstrual cycle: Principal | ICD-10-CM

## 2005-06-30 DIAGNOSIS — N938 Other specified abnormal uterine and vaginal bleeding: Secondary | ICD-10-CM

## 2005-06-30 LAB — URINE PREGNANCY TEST (POINT OF CARE): HCG QUALITATIVE URINE: POSITIVE

## 2005-06-30 LAB — HCG QUANTITATIVE: HCG QUANTITATIVE: 4899 m[IU]/mL — ABNORMAL HIGH (ref 1–3)

## 2005-06-30 MED ORDER — CITRACAL PRENATAL RX 27-1 MG PO TABS
ORAL_TABLET | ORAL | Status: DC
Start: 2005-06-30 — End: 2005-07-09

## 2005-06-30 NOTE — Progress Notes (Signed)
History:  Date of last menstrual period :  Patient's last menstrual period was 04/23/2005.  It was On time.  Was it a normal period(flow,duration, etc.)? yes    Are you using any kind of contraception? No, seeking preg x 5 yrs. Positive preg test at home on 5/11, prior test was negative. Hx irregular periods.    Have you ever been pregnant in the past? Yes, SAB at [redacted] wks gestation    Test Result: Positive    Education/Counseling Provided:  Test is Positive:   Smoking, alcohol, illicit drugs  importance early prenatal care      Other comments/plans:    Referrals:    Prenatal care. Appointment scheduded? Yes. At 5/23 [facility]    Other:     1-pt states she has had 2 episodes of spotting over past 2 wks. No pain, some light cramping.     2-pt stopped SSRI 2 wks ago when she thought she might be pregnant. She was taking for anxiety/depression which she relates was worsened by worrying about abd pain she was having at the time. She has been feeling better for sometime.    Blood type is O positive, confirmed in pt chart.    V72.42 PREGNANCY TEST, POSITIVE (primary encounter diagnosis)  Plan: REFERRAL TO OB/GYN (INT), HCG QUANTITATIVE,    CITRACAL PRENATAL RX OR TABS  Start PNV rx (in process of renewing Free Care)  Discussed SSRI use in pregnancy r/t risk/benefit- as she already stopped, she and her husband (present today) should monitor mood and symptoms so that she can restart prn.    626.8 MENSTRUAL DISORDER NEC  Plan: HCG QUANTITATIVE, HCG QUANTITATIVE  OB US today d/t bleeding and S&D  Bldwk today and repeat 2 days

## 2005-07-01 ENCOUNTER — Telehealth (HOSPITAL_BASED_OUTPATIENT_CLINIC_OR_DEPARTMENT_OTHER): Payer: Self-pay

## 2005-07-01 LAB — US ABDOMEN PELVIS SCROTUM & OR RETROPERITONEUM DOPPLER LIMITED

## 2005-07-01 LAB — US OB 1ST TRIMESTER

## 2005-07-01 LAB — US PREG UTERUS REAL TIME W/IMAGE DCMTN TRANSVAG

## 2005-07-01 NOTE — Telephone Encounter (Signed)
Staff Message copied by Ladora Daniel on 07/01/2005 at 12:17 PM  ------   Message from: DE PADUA, MIRNA   Created: 07/01/2005 at 12:08 PM   Regarding: lab results   Contact: [phone]    Ilaisaane Marts [id number], 35 year old, female, [phone] (home) [phone] (work)    [name] NUMBER: [phone]  Cell phone:   Other phone:    Available times:    Patient's language of care: English    Patient does not need an interpreter.    Patient's PCP: [name] MD, MD    Person calling on behalf of patient: patient (self)    Calls today for test result(s), ultrasound, lab results. Can be reach after 1pm at tel# above. Thanks    Patient's Preferred Pharmacy:   [facility] ([place])  Phone: [phone] Fax: [phone]    Kenard Gower [place]  Phone: [phone] Fax: [phone]

## 2005-07-01 NOTE — Telephone Encounter (Addendum)
Pt informed of results from OB US ordered for 1st trimester spotting    [name] 5 wk 2day pregnancy    She agrees to repeat hcg quant tomorrow and repeat US 1 wk

## 2005-07-02 ENCOUNTER — Other Ambulatory Visit (HOSPITAL_BASED_OUTPATIENT_CLINIC_OR_DEPARTMENT_OTHER): Payer: Charity | Admitting: Lab

## 2005-07-02 DIAGNOSIS — N925 Other specified irregular menstruation: Secondary | ICD-10-CM

## 2005-07-02 DIAGNOSIS — N938 Other specified abnormal uterine and vaginal bleeding: Secondary | ICD-10-CM

## 2005-07-02 DIAGNOSIS — N949 Unspecified condition associated with female genital organs and menstrual cycle: Principal | ICD-10-CM

## 2005-07-02 LAB — HCG QUANTITATIVE: HCG QUANTITATIVE: 8675 m[IU]/mL — ABNORMAL HIGH (ref 1–3)

## 2005-07-02 NOTE — Nursing Note (Signed)
>>   RAIS, LISA     07/02/2005   4:06 pm  Labs complete  LR

## 2005-07-03 NOTE — Telephone Encounter (Addendum)
Comment: results    Hcg quant#1 5/14 4899    Hcg quant #2 5/16 8675    Doubled in 48 hrs, WNL    Repeat US 5/22 [facility] 9:30am    LM for pt to CB

## 2005-07-08 ENCOUNTER — Telehealth (HOSPITAL_BASED_OUTPATIENT_CLINIC_OR_DEPARTMENT_OTHER): Payer: Self-pay | Admitting: Registered Nurse

## 2005-07-08 ENCOUNTER — Other Ambulatory Visit (HOSPITAL_BASED_OUTPATIENT_CLINIC_OR_DEPARTMENT_OTHER): Payer: Self-pay

## 2005-07-08 DIAGNOSIS — O34599 Maternal care for other abnormalities of gravid uterus, unspecified trimester: Secondary | ICD-10-CM

## 2005-07-08 DIAGNOSIS — O209 Hemorrhage in early pregnancy, unspecified: Secondary | ICD-10-CM

## 2005-07-08 DIAGNOSIS — N83209 Unspecified ovarian cyst, unspecified side: Secondary | ICD-10-CM

## 2005-07-08 NOTE — Telephone Encounter (Signed)
Staff Message copied by Barbarann Ehlers on 07/08/2005 at 4:47 PM  ------   Message from: Jerelene Redden   Created: 07/08/2005 at 4:36 PM   Regarding: requesting radiology requisition    Alejandra Martin [id number], 35 year old, female, [phone] (home) [phone] (work)    CALL BACK NUMBER:   Cell phone:   Other phone:    Available times:    Patient's language of care: English    Patient does not need an interpreter.    Patient's PCP: [name] MD, MD    Person calling on behalf of patient: Evette ex [phone] radiology in [place] fax [phone]    Calls today requesting requisition for ultrasound cardiac view    Patient's Preferred Pharmacy:   [facility] ([place])  Phone: [phone] Fax: [phone]    Kenard Gower [place]  Phone: [phone] Fax: [phone]

## 2005-07-08 NOTE — Telephone Encounter (Signed)
Request faxed

## 2005-07-08 NOTE — Telephone Encounter (Signed)
Pt has an appointment tomorrow. Need req faxed.

## 2005-07-09 ENCOUNTER — Telehealth (HOSPITAL_BASED_OUTPATIENT_CLINIC_OR_DEPARTMENT_OTHER): Payer: Self-pay | Admitting: Nurse Practitioner

## 2005-07-09 ENCOUNTER — Ambulatory Visit (HOSPITAL_BASED_OUTPATIENT_CLINIC_OR_DEPARTMENT_OTHER): Payer: Charity | Admitting: Nurse Practitioner

## 2005-07-09 ENCOUNTER — Encounter (HOSPITAL_BASED_OUTPATIENT_CLINIC_OR_DEPARTMENT_OTHER): Payer: Self-pay | Admitting: Nurse Practitioner

## 2005-07-09 VITALS — BP 110/70 | Wt 206.0 lb

## 2005-07-09 DIAGNOSIS — Z8759 Personal history of other complications of pregnancy, childbirth and the puerperium: Secondary | ICD-10-CM | POA: Insufficient documentation

## 2005-07-09 DIAGNOSIS — Z3400 Encounter for supervision of normal first pregnancy, unspecified trimester: Secondary | ICD-10-CM

## 2005-07-09 LAB — URINALYSIS
BILIRUBIN, URINE: NEGATIVE
GLUCOSE, URINE: NEGATIVE MG/DL
KETONE, URINE: NEGATIVE MG/DL
LEUKOCYTE ESTERASE: NEGATIVE
NITRITE, URINE: NEGATIVE
OCCULT BLOOD, URINE: NEGATIVE
PH URINE: 5.5 (ref 5.0–8.0)
PROTEIN, URINE: NEGATIVE MG/DL
SPECIFIC GRAVITY URINE: 1.025 (ref 1.003–1.035)

## 2005-07-09 LAB — BLOOD COUNT COMPLETE AUTOMATED
HEMATOCRIT: 33.5 % — ABNORMAL LOW (ref 37.0–47.0)
HEMOGLOBIN: 11.6 g/dL — ABNORMAL LOW (ref 12.0–16.0)
MEAN CORP HGB CONC: 34.6 g/dL (ref 32.0–36.0)
MEAN CORPUSCULAR HGB: 31.7 pg — ABNORMAL HIGH (ref 27.0–31.0)
MEAN CORPUSCULAR VOL: 91.6 fL (ref 81.0–99.0)
MEAN PLATELET VOLUME: 8 fL (ref 6.4–10.8)
PLATELET COUNT: 223 10*3/uL (ref 150–400)
RBC DISTRIBUTION WIDTH: 12.4 % (ref 11.5–14.3)
RED BLOOD CELL COUNT: 3.65 MIL/uL — ABNORMAL LOW (ref 4.20–5.40)
WHITE BLOOD CELL COUNT: 7.2 10*3/uL (ref 4.8–10.8)

## 2005-07-09 LAB — TYPE AND SCREEN

## 2005-07-09 LAB — URINE PREGNANCY TEST (POINT OF CARE): HCG QUALITATIVE URINE: POSITIVE

## 2005-07-09 MED ORDER — PRENATAL RX OR TABS
ORAL_TABLET | ORAL | Status: AC
Start: 2005-07-09 — End: 2006-07-09

## 2005-07-09 NOTE — Telephone Encounter (Signed)
Please tell pt she is anemic and should start taking OTC ferrous sulfate 325 mg daily x 1 wk and then increase to bid

## 2005-07-09 NOTE — Progress Notes (Signed)
New Prenatal Visit    S:   35 year old  G2P0010 LMP ?march-irregular menses EDD 02/26/2006 per u/s    Presents for [facility], thrilled with pregnancy, seeking pregnancy x 4 yrs, very anxious since had sab at 5 wks 2 yrs ago    Since LMP short episode of scant staining last week which has resolved, u/s showing 6 6/7 wk IUP today, pt self d/c'ed citalopram last month with discovery of pregnancy-rx'ed x 1 yr with citalopram for depression and anxiety after sab    No further exposures or issues since pregnant    Notes history of maternal aunt with Down's syndrome    Past Medical History:   ACUTE GASTRITIS W/O HEMORRHAGE 06/12/2004    Comment: H. Pylori positive (done at [facility]); GI    consult Dr. Sharrie Rothman, TOC 10/01/04   CLOSTRIDIUM DIFFICILE 07/26/2004    Comment: GI cosult: Dr. Suzie Portela, TOC done on 10/01/04   ALLERGY, UNSPECIFIED 03/26/2004    Comment: Seen by ENT, deviated septum, hypertrophy of    inferior nasal turbinates  Past Surgical History:   NO SIGNIFICANT SURGICAL HISTORY   Review of patient's family history indicates:   Diabetes Mother    Hypertension Mother    Lipids Mother    Hypertension Father      Social History Narrative   07/09/2005   To [name] from the [place] with family at age 48, husband from [place]   Works FT - [employer]   Pt thrilled with pregnancy-seeking pregnancy x 4 yrs, very anxious since sab 2 yrs ago         O:   PHYSICAL EXAM:  Constitutional: well developed, well nourished female  Skin: clear and normal  Neck: supple, no adenopathy and thyroid normal  Thyroid: not enlarged and no palpable masses  Chest: clear  Heart: regular rate and rhythm and no murmur  Breasts: no masses, skin, nipple or axillary changes and nipples everted  Abdomen: no masses or tenderness, no palpable hepatosplenomegaly, soft, non-tender and obese    PELVIC:  External Genitalia: normal architecture  Vagina: well rugated and no lesions  Vaginal Discharge: normal appearing  Pelvic supports:  normal  Cervix: no lesions and nulliparous appearance  Uterus: smooth contour, irregular contour, mobile and non-tender 8 wk size  Adnexa: no masses, nodularity, tenderness and ovaries palpated bilaterally    Extremities: normal  Anus and Perineum: normal  Rectum: normal    See prenatal flowsheet for weight, BP, fundal hgt, edema status, FHR, fetal movement, albumin and glucose testing and presentation      A:   Pregnancy at Gestational Age: 6w 6d  EDD based on u/s    P:    - Routine prenatal labs ordered. HIV counseling done.   -   Discussed testing: genetic testing, AFP screen, U/S in pregnancy, amniocentesis and ERA  Discussed social factors: nutrition, alcohol use, drug use, smoking and social situation     - Reviewed risks related to the environment, eating fish, raw milk, raw meat, cats, gardening and over heating.   - Warning signs reviewed: instructed to call for pain, bleeding, dysuria, increased nausea and vomiting and any other concerns.   - Reviewed usual schedule of visits and how to call office.   -1st trimester pack given, referred for [facility], insurance, HIV ed/testing, lengthy discussion about AMA and testing options,lengthy discussion about depression/anxiety/pospartum potential issues and use of antidepressant medications in pregnancy - pt agrees to f/u with Carleen Riselli  at [facility] to continue this discussion and assess medication need   - Follow up: 3 wks to check FHR and decision about ERA

## 2005-07-09 NOTE — Patient Instructions (Addendum)
Insurance referrral    Fetal survey u/s wk of 09/29/2005    [facility] for HIV ed/testing    OBS appts  2-3 wks - Raylene Miyamoto, ?ERA  5 wks - Evelyn  9 wks - chaudhuri,AFP  13 wks - Renea Ee

## 2005-07-10 ENCOUNTER — Telehealth (HOSPITAL_BASED_OUTPATIENT_CLINIC_OR_DEPARTMENT_OTHER): Payer: Self-pay

## 2005-07-10 LAB — CHLAMYDIA GC NAAT
GENPROBE CHLAMYDIA: NEGATIVE
GENPROBE GC: NEGATIVE

## 2005-07-10 LAB — US OB 1ST TRIMESTER

## 2005-07-10 NOTE — Telephone Encounter (Signed)
TC to pt, LM on ans machine to return call to clinic.

## 2005-07-10 NOTE — Telephone Encounter (Signed)
Pt called to inform me she is [redacted] wks pregnant. She is thrilled, but also nervous about the pregnancy. She stopped taking her citalopram 4 weeks ago when she missed her period. Pt and I discussed the risks associated with SSRI's during pregnancy. Typically, we only recommend to continue SSRI therapy when pt is so severly depressed/anxious/psychotic that she would be unable to care for self during prenatal period, thus putting baby at greater risk than if mother continued SSRI during pregnancy. Pt's past/current presentation does not place her in this severe category. I do expect for her to have increased anxiety during pregnancy both due to being off meds and b/c she is anxious about the pregnancy itself. Pt states she does not wish to restart SSRI at this time. She will see me on 7/23 for f/u. We also discussed that she is at risk of post partum depression, and will need to consider going back on SSRI after baby is born, we will discuss risks of SSRI during breast feeding as the time gets closer. Will CC Hartley Barefoot.

## 2005-07-10 NOTE — Telephone Encounter (Signed)
TC to pt Informed pt that she is anemic and needs to start taking an iron pill. Told pt to buy ferrous sulfate OTC and take 1 pill a day x 1 week and then increase to 2 pills a day.   Pt states she had a pap smear done yesterday and had some blood after and today when wiping notice some light brown discharge. I told pt that was old blood and probably from yesterday. If she statrs to have BRB, cramping or severe abdominal pain she needs to call us right back.

## 2005-07-10 NOTE — Telephone Encounter (Signed)
Staff Message copied by Lorri Frederick on 07/10/2005 at 12:34 PM  ------   Message from: Encarnacion Chu   Created: 07/10/2005 at 12:28 PM   Regarding: ret call    Joyleen Haselton [id number], 35 year old, female, [phone] (home) [phone] (work)    CALL BACK NUMBER: home  Cell phone:   Other phone:    Available times:    Patient's language of care: English    Patient does not need an interpreter.    Patient's PCP: [name] MD, MD    Person calling on behalf of patient: patient (self)    Calls today   Returning call for test result(s).

## 2005-07-11 LAB — HGB ELECTROPHORESIS
HEMOGLOBIN A1A3: 96 % (ref 96.0–99.5)
HEMOGLOBIN A2: 3 % (ref 2.0–3.5)
HEMOGLOBIN C: 0 % (ref 0–0)
HEMOGLOBIN F: 0.8 % (ref 0–2.0)
HEMOGLOBIN S: 0 % (ref 0–0)

## 2005-07-11 LAB — HEPATITIS B SURFACE ANTIGEN: HEPATITIS B SURFACE ANTIGEN: NONREACTIVE

## 2005-07-11 LAB — VARICELLA ZOSTER IGG ANTIBODY: VARICELLA ZOSTER IGG ANTIBODY: 3.2 — ABNORMAL HIGH (ref 0.0–0.8)

## 2005-07-11 LAB — THYROID SCREEN TSH REFLEX FT4: THYROID SCREEN TSH REFLEX FT4: 2.9 u[IU]/mL (ref 0.34–5.60)

## 2005-07-11 LAB — HEPATITIS C ANTIBODY: HEPATITIS C ANTIBODY: NEGATIVE

## 2005-07-11 LAB — RUBELLA IGG ANTIBODY: RUBELLA: 28 IU/mL — ABNORMAL HIGH (ref 0–15)

## 2005-07-11 LAB — TREPONEMA PALLIDUM AB IGG: TREPONEMA PALLIDUM AB IgG: NONREACTIVE

## 2005-07-12 LAB — URINE CULTURE/COLONY COUNT

## 2005-07-15 ENCOUNTER — Telehealth (HOSPITAL_BASED_OUTPATIENT_CLINIC_OR_DEPARTMENT_OTHER): Payer: Self-pay | Admitting: Nurse Practitioner

## 2005-07-15 ENCOUNTER — Encounter (HOSPITAL_BASED_OUTPATIENT_CLINIC_OR_DEPARTMENT_OTHER): Payer: Self-pay

## 2005-07-15 DIAGNOSIS — Z8759 Personal history of other complications of pregnancy, childbirth and the puerperium: Secondary | ICD-10-CM | POA: Insufficient documentation

## 2005-07-15 DIAGNOSIS — N390 Urinary tract infection, site not specified: Secondary | ICD-10-CM

## 2005-07-15 MED ORDER — AMOXICILLIN 500 MG PO CAPS
ORAL_CAPSULE | ORAL | Status: AC
Start: 2005-07-15 — End: 2005-07-25

## 2005-07-15 NOTE — Telephone Encounter (Signed)
TC pt. Left message on pt voice mail to call [facility] at [phone] and ask to speak with the nurses.

## 2005-07-15 NOTE — Telephone Encounter (Signed)
Please tell pt she has a UTI and needs rx. Reconfirm allergies and may call in Amoxicillen 500 mg tid x 10 days

## 2005-07-15 NOTE — Telephone Encounter (Signed)
REC call from pt, notified of +UTI and need for abxs to treat infection. Per pt only allergy is to Erythromycin.Instructed to complete entire course of abxs and to drink lots of fluid while taking medication. Pt states she understands these instructions.Rx called in as ordered.

## 2005-07-18 ENCOUNTER — Telehealth (HOSPITAL_BASED_OUTPATIENT_CLINIC_OR_DEPARTMENT_OTHER): Payer: Self-pay | Admitting: "Women's Health Care

## 2005-07-18 NOTE — Telephone Encounter (Signed)
Staff Message copied by Lorri Frederick on 07/18/2005 at 1:20 PM  ------   Message from: Gershon Crane   Created: 07/18/2005 at 1:18 PM    Alejandra Martin [id number], 35 year old, female, [phone] (home) [phone] (work)    [name] NUMBER: [phone]  Cell phone:   Other phone:    Available times:    Patient's language of care: English    Patient does not need an interpreter.    Patient's PCP: [name] MD, MD    Person calling on behalf of patient: patient (self)    Calls today for med refill(s). RX QUESTION

## 2005-07-18 NOTE — Telephone Encounter (Signed)
TC to pt Pt states she was told by Renea Ee to take folic acid until she got her [employer] and then pick up prescription for PNV. Pt has her PNV now and wants to know if she should stop the folic acid.     I told pt to stop folic acid because PNV usually has that in it. I told pt to check with Dr. Raylene Miyamoto at her 07/30/05 appointment to see if she wants her to continue.

## 2005-07-21 ENCOUNTER — Telehealth (HOSPITAL_BASED_OUTPATIENT_CLINIC_OR_DEPARTMENT_OTHER): Payer: Self-pay

## 2005-07-21 LAB — CYSTIC FIBROSIS DNA ANALYSIS: CYSTIC FIBROSIS DNA ANALYSIS: NEGATIVE

## 2005-07-21 LAB — CYTOPATH, C/V, THIN LAYER

## 2005-07-21 NOTE — Telephone Encounter (Signed)
Staff Message copied by Ardeen Fillers on 07/21/2005 at 1:48 PM  ------   Message from: Encarnacion Chu   Created: 07/21/2005 at 1:46 PM   Regarding: other    Alejandra Martin [id number], 35 year old, female, [phone] (home) [phone] (work)    [name] NUMBER: [phone]    Cell phone:   Other phone:    Available times:    Patient's language of care: English    Patient does not need an interpreter.    Patient's PCP: [name] MD, MD    Person calling on behalf of patient: patient (self)    Calls today   Has ? About a med she is taking

## 2005-07-21 NOTE — Telephone Encounter (Signed)
TC pt at call back number [phone]. Per woman who answered phone nobody by the name of Lucy lives at this number. Will try pt home number.

## 2005-07-21 NOTE — Telephone Encounter (Signed)
TC from pt. Pt is a G2 P0 EDC 02/28/05 (8 3/7 wks) who is c/o that she cannot take her PNV, it is making her sick. She is also taking amoxicillin and FE pills. She states the antibiotic does upset her stomach but she can tolerate it, also she states she can tolerate the FE. The PNV taste horrible, like soap in her mouth and she gets nauseous looking at it. Is there something else she can take?    Forward to E. Neiman

## 2005-07-21 NOTE — Telephone Encounter (Signed)
TC pt at home number. Left message on voice mail that [facility] is returning her call and to call clinic back at [phone].

## 2005-07-22 NOTE — Telephone Encounter (Signed)
She can wait until she finishes the course of antibiotics before resuming the prenatal vitamins. If the vitamins continue to be a problem she can take any OTC multivitamina and folic acid 1 mg

## 2005-07-22 NOTE — Telephone Encounter (Signed)
TC pt. Left message on pt voice mail to call [facility] at [phone] and ask to speak with the nurses.

## 2005-07-22 NOTE — Telephone Encounter (Signed)
Staff Message copied by Ardeen Fillers on 07/22/2005 at 12:39 PM  ------   Message from: Gershon Crane   Created: 07/22/2005 at 12:29 PM    Alejandra Martin [id number], 35 year old, female, [phone] (home) [phone] (work)    [name] NUMBER: [phone]  Cell phone:   Other phone:    Available times:    Patient's language of care: English    Patient does not need an interpreter.    Patient's PCP: [name] MD, MD    Person calling on behalf of patient: patient (self)    Calls today RETURNING CALL

## 2005-07-22 NOTE — Telephone Encounter (Signed)
TC pt. Left message on voice mail to call [facility] at [phone]

## 2005-07-22 NOTE — Telephone Encounter (Signed)
Per Alejandra Martin pt advised to wait on prenatal vits until she is finished her antibiotics. Once finished the antibiotics, take prenatal vits as prescribed. If still having a problem with prenatal vits may take any otc multivit and 1mg  of folic acid.

## 2005-07-30 ENCOUNTER — Ambulatory Visit (HOSPITAL_BASED_OUTPATIENT_CLINIC_OR_DEPARTMENT_OTHER): Payer: MEDICAID | Admitting: Obstetrics & Gynecology

## 2005-07-30 VITALS — BP 110/64 | Wt 205.5 lb

## 2005-07-30 DIAGNOSIS — Z3480 Encounter for supervision of other normal pregnancy, unspecified trimester: Secondary | ICD-10-CM

## 2005-07-30 LAB — URINE DIP (POINT OF CARE)
BILIRUBIN, URINE: NEGATIVE mg/dl (ref 0–0)
GLUCOSE, URINE: NEGATIVE mg/dl (ref 0–0)
KETONE, URINE: NEGATIVE mg/dl (ref 0–0)
LEUKOCYTE ESTERASE: NEGATIVE Leu/mcl (ref 0–0)
NITRITE, URINE: NEGATIVE
OCCULT BLOOD, URINE: NEGATIVE mg/dl (ref 0–0)
PH URINE: 6.5 (ref 5.0–8.0)
PROTEIN, URINE: NEGATIVE mg/dl (ref 0–15)
SPECIFIC GRAVITY URINE: 1.025 (ref 1.003–1.030)
UROBILINOGEN URINE: 0.2 mg/dl (ref 0.2–1.0)

## 2005-07-30 MED ORDER — NATACHEW OR CHEW
CHEWABLE_TABLET | ORAL | Status: DC
Start: 2005-07-30 — End: 2006-04-15

## 2005-08-01 ENCOUNTER — Telehealth (HOSPITAL_BASED_OUTPATIENT_CLINIC_OR_DEPARTMENT_OTHER): Payer: Self-pay | Admitting: Obstetrics & Gynecology

## 2005-08-01 LAB — URINE CULTURE/COLONY COUNT

## 2005-08-01 MED ORDER — AMPICILLIN 500 MG PO CAPS
ORAL_CAPSULE | ORAL | Status: DC
Start: 2005-08-01 — End: 2006-06-15

## 2005-08-01 NOTE — Telephone Encounter (Signed)
I tried to reach this patient late Friday aftenoon without success. I left her a message with the office number. She has as persistant UTI and needs to be retreated and then started on antibiotic prophylaxis for the remainder of the pregnancy.

## 2005-08-04 ENCOUNTER — Telehealth (HOSPITAL_BASED_OUTPATIENT_CLINIC_OR_DEPARTMENT_OTHER): Payer: Self-pay | Admitting: "Women's Health Care

## 2005-08-04 NOTE — Telephone Encounter (Signed)
TC to pt Pt received a message from Dr. Raylene Miyamoto and wanted to make sure it was about the same thing we spoke about earlier. I told pt yes, it was in regards to her UTI.

## 2005-08-04 NOTE — Telephone Encounter (Signed)
Staff Message copied by Lorri Frederick on 08/04/2005 at 9:28 AM  ------   Message from: Gershon Crane   Created: 08/04/2005 at 9:13 AM    Alejandra Martin [id number], 35 year old, female, [phone] (home) [phone] (work)    [name] NUMBER: [phone]  Cell phone:   Other phone:    Available times:    Patient's language of care: English    Patient does not need an interpreter.    Patient's PCP: [name] MD, MD    Person calling on behalf of patient: patient (self)    Calls today RETURNING A CALL FROM PATRICIA

## 2005-08-04 NOTE — Telephone Encounter (Signed)
TC to pt Informed her that Dr. Raylene Miyamoto wants pt to continue on antibiotics for the remainder of her pregnancy for a persisitent UTI. Instructions given as written by Dr. Raylene Miyamoto. Pt asking lots of questions, answered to pts satisfaction. I will call prescription into pts pharmacy of choice.

## 2005-08-04 NOTE — Telephone Encounter (Signed)
TC to pt Left message on machine to call [facility] back today before 5 pm, it is very important. Ask to speak to a nurse.

## 2005-08-04 NOTE — Telephone Encounter (Signed)
TC to number listed x 3. Line rings busy.

## 2005-08-04 NOTE — Telephone Encounter (Signed)
Staff Message copied by Lorri Frederick on 08/04/2005 at 12:45 PM  ------   Message from: Gershon Crane   Created: 08/04/2005 at 12:32 PM    Allea Kassner [id number], 35 year old, female, [phone] (home) [phone] (work)    [name] NUMBER: [phone]  Cell phone:   Other phone:    Available times:    Patient's language of care: English    Patient does not need an interpreter.    Patient's PCP: [name] MD, MD    Person calling on behalf of patient: patient (self)    Calls today RETURNING CALL

## 2005-08-12 ENCOUNTER — Encounter (HOSPITAL_BASED_OUTPATIENT_CLINIC_OR_DEPARTMENT_OTHER): Payer: Charity

## 2005-08-13 ENCOUNTER — Ambulatory Visit (HOSPITAL_BASED_OUTPATIENT_CLINIC_OR_DEPARTMENT_OTHER): Payer: MEDICAID | Admitting: Nurse Practitioner

## 2005-08-13 VITALS — BP 110/70 | Wt 211.0 lb

## 2005-08-13 DIAGNOSIS — Z3400 Encounter for supervision of normal first pregnancy, unspecified trimester: Secondary | ICD-10-CM

## 2005-08-13 LAB — URINE DIP (POINT OF CARE)
BILIRUBIN, URINE: NEGATIVE mg/dl (ref 0–0)
GLUCOSE, URINE: NEGATIVE mg/dl (ref 0–0)
KETONE, URINE: NEGATIVE mg/dl (ref 0–0)
LEUKOCYTE ESTERASE: NEGATIVE Leu/mcl (ref 0–0)
NITRITE, URINE: NEGATIVE
OCCULT BLOOD, URINE: NEGATIVE mg/dl (ref 0–0)
PH URINE: 5.5 (ref 5.0–8.0)
PROTEIN, URINE: NEGATIVE mg/dl (ref 0–15)
SPECIFIC GRAVITY URINE: 1.03 (ref 1.003–1.030)
UROBILINOGEN URINE: 0.2 mg/dl (ref 0.2–1.0)

## 2005-08-13 NOTE — Patient Instructions (Signed)
Wk of 11/03/2005 with Dr. Raylene Miyamoto

## 2005-08-15 LAB — URINE CULTURE/COLONY COUNT

## 2005-08-29 ENCOUNTER — Telehealth (HOSPITAL_BASED_OUTPATIENT_CLINIC_OR_DEPARTMENT_OTHER): Payer: Self-pay | Admitting: Registered Nurse

## 2005-08-29 NOTE — Telephone Encounter (Signed)
Staff Message copied by Otilio Miu on 08/29/2005 at 11:23 AM  ------   Message from: Almira Coaster   Created: 08/29/2005 at 11:22 AM    Alejandra Martin [id number], 35 year old, female, [phone] (home) [phone] (work)    CALL BACK NUMBER: pt's calling from [place]  Cell phone:   Other phone:    Available times:    Patient's language of care: English    Patient does not need an interpreter.    Patient's PCP: [name] MD, MD    Person calling on behalf of patient: patient (self)    Calls today with OB problem: 3 1/2 wks
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
## 2005-08-29 NOTE — Telephone Encounter (Signed)
Discussed with Dr. Raylene Miyamoto who states, "UC results on 08/13/05 greater than 100,000 mixed flora is a normal result and you can call pt's sister and notify her that the result is normal."    T/C to "Valentina Gu" she was informed of test result per Dr. Raylene Miyamoto.

## 2005-09-08 ENCOUNTER — Ambulatory Visit (HOSPITAL_BASED_OUTPATIENT_CLINIC_OR_DEPARTMENT_OTHER): Payer: PRIVATE HEALTH INSURANCE

## 2005-09-10 ENCOUNTER — Ambulatory Visit (HOSPITAL_BASED_OUTPATIENT_CLINIC_OR_DEPARTMENT_OTHER): Payer: PRIVATE HEALTH INSURANCE | Admitting: Obstetrics & Gynecology

## 2005-09-10 VITALS — BP 110/70 | Wt 214.5 lb

## 2005-09-10 DIAGNOSIS — Z3480 Encounter for supervision of other normal pregnancy, unspecified trimester: Secondary | ICD-10-CM

## 2005-09-10 LAB — URINE DIP (POINT OF CARE)
BILIRUBIN, URINE: NEGATIVE mg/dl (ref 0–0)
GLUCOSE, URINE: NEGATIVE mg/dl (ref 0–0)
KETONE, URINE: NEGATIVE mg/dl (ref 0–0)
LEUKOCYTE ESTERASE: NEGATIVE Leu/mcl (ref 0–0)
NITRITE, URINE: NEGATIVE
OCCULT BLOOD, URINE: NEGATIVE mg/dl (ref 0–0)
PH URINE: 5.5 (ref 5.0–8.0)
PROTEIN, URINE: NEGATIVE mg/dl (ref 0–15)
SPECIFIC GRAVITY URINE: 1.025 (ref 1.003–1.030)
UROBILINOGEN URINE: 0.2 mg/dl (ref 0.2–1.0)

## 2005-09-12 LAB — URINE CULTURE/COLONY COUNT: URINE CULTURE/COLONY COUNT: NO GROWTH

## 2005-09-15 ENCOUNTER — Telehealth (HOSPITAL_BASED_OUTPATIENT_CLINIC_OR_DEPARTMENT_OTHER): Payer: Self-pay | Admitting: Registered Nurse

## 2005-09-15 NOTE — Telephone Encounter (Signed)
T/C to pt who intially called for "results" then went on to state:  "I get dizzy 1 x day for a few seconds every day for the last two weeks"     When asked states she is drinking fluilds QS and that she has had these "dizzy" episodes in the past when she was not pregnant and that her PCP is aware and told her that it was "just imbalance" She denies ENT issues.    Forwarded to Triage provider for consult     Pt aware U/C "no growth after 24 hours"    Pt asking if she needs to continue antibiotic. Pt aware of need to continue taking antibiotics as ordered for prophylaxis as disscussed with her and MD on 6/07 and 09/10/05 visit.  Reviewed with pt S&S of uti and she states she will report if occur.  Encouraged to call back clinic with any questions/concerns.

## 2005-09-15 NOTE — Telephone Encounter (Signed)
Staff Message copied by Otilio Miu on 09/15/2005 at 2:38 PM  ------   Message from: Toni Amend   Created: 09/15/2005 at 2:21 PM    Alejandra Martin [id number], 35 year old, female, [phone] (home) [phone] (work)    [name] NUMBER: [phone]  Cell phone:   Other phone:    Available times:    Patient's language of care: English    Patient does not need an interpreter.    Patient's PCP: [name] MD, MD    Person calling on behalf of patient: patient (self)    Calls today with OB problem: RESULTS

## 2005-09-15 NOTE — Telephone Encounter (Signed)
Agree with advice given below.

## 2005-09-16 ENCOUNTER — Encounter (HOSPITAL_BASED_OUTPATIENT_CLINIC_OR_DEPARTMENT_OTHER): Payer: Self-pay

## 2005-09-16 ENCOUNTER — Ambulatory Visit (HOSPITAL_BASED_OUTPATIENT_CLINIC_OR_DEPARTMENT_OTHER): Payer: PRIVATE HEALTH INSURANCE

## 2005-09-16 DIAGNOSIS — Z3009 Encounter for other general counseling and advice on contraception: Secondary | ICD-10-CM

## 2005-09-16 NOTE — Progress Notes (Signed)
Comprehensive STI Visit Note:       History: 35 yo female  Reason for Visit:  OB/HIV routine education      Current meds: none  Allergies: Erythromycin     Session Outline Done: Y        Result Waiting Time OK with Pt.? Y       Prior Knowledge about STI/HIV: Moderate   Tested Before? N    Discussed transmission:   Y        Discussed risky behaviors:  Y        Discussed non-risks/myths : Y       Information about Hep. C given: Y          Risk Assessment Completed: Y         Current Partner: for 8 yrs   # of partners Lifetime: 2   Sexual partners:  Male       Sexual practices: Vaginal    Drug Use: none  Piercing/Tattoo: Y N   Known Partner Risks: Pt instructed in discuss with her husband the importance of him also to be tested for HIV. HIV free test, hotline number given.  Hx of STDs: none     Condom Use: Never             Risk Reduction Planning Completed: Y      Condom Instruction Given: Y      Other Risk Reduction plans discussed     Last Poss. 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    Contraception discussed      Clinic contact number both day and after hours/emergency     Handouts Given     Follow-Up Plan    Pt to make app for her HIV test result.    Milus Banister [facility]

## 2005-09-17 LAB — HIV 1 AND 2 PLUS O ANTIBODY: HIV 1 AND 2 PLUS O SCREEN: NONREACTIVE

## 2005-09-29 ENCOUNTER — Ambulatory Visit (HOSPITAL_BASED_OUTPATIENT_CLINIC_OR_DEPARTMENT_OTHER): Payer: PRIVATE HEALTH INSURANCE

## 2005-09-29 ENCOUNTER — Other Ambulatory Visit: Payer: Self-pay | Admitting: Nurse Practitioner

## 2005-09-29 DIAGNOSIS — Z3009 Encounter for other general counseling and advice on contraception: Secondary | ICD-10-CM

## 2005-09-29 LAB — US OB 2ND OR 3RD TRIMESTER SURVEY

## 2005-09-29 NOTE — Progress Notes (Signed)
Comprehensive STI Visit Note:   History: 35 yo female  Reason for Visit: OB/HIV test result. HIV negative result given. Education reviewed.   Current meds: none  Allergies: Erythromycin    Session Outline Done: Y   Result Waiting Time OK with Pt.? Y   Prior Knowledge about STI/HIV: Moderate   Tested Before? N   Discussed transmission: Y   Discussed risky behaviors: Y   Discussed non-risks/myths : Y   Information about Hep. C given: Y     Risk Assessment Completed: Y   Current Partner: for 8 yrs   # of partners Lifetime: 2   Sexual partners: Male    Sexual practices: Vaginal   Drug Use: none  Piercing/Tattoo: Y N   Known Partner Risks: Pt instructed in discuss with her husband the importance of him also to be tested for HIV. HIV free test, hotline number given.  Hx of STDs: none   Condom Use: Never     Risk Reduction Planning Completed: Y   Condom Instruction Given: Y   Other Risk Reduction plans discussed     Last Poss. Exposure: 09/24/05  Protection used: N   Seroconversion Period Explained: Y     Education/Counseling     Condoms: offered distributed   Confidentiality   Handouts given   Safer Sex Disc.   Contraception discussed    Clinic contact number both day and after hours/emergency   Handouts Given     Follow-Up Plan    Pt instructed in make app with the [facility] at the end of her pregnancy for methods of [facility] education + options.    Milus Banister [facility]

## 2005-10-08 ENCOUNTER — Ambulatory Visit (HOSPITAL_BASED_OUTPATIENT_CLINIC_OR_DEPARTMENT_OTHER): Payer: PRIVATE HEALTH INSURANCE | Admitting: Nurse Practitioner

## 2005-10-08 VITALS — BP 110/70 | Wt 224.0 lb

## 2005-10-08 DIAGNOSIS — Z3400 Encounter for supervision of normal first pregnancy, unspecified trimester: Secondary | ICD-10-CM

## 2005-10-08 DIAGNOSIS — Z8759 Personal history of other complications of pregnancy, childbirth and the puerperium: Secondary | ICD-10-CM | POA: Insufficient documentation

## 2005-10-08 LAB — URINE DIP (POINT OF CARE)
BILIRUBIN, URINE: NEGATIVE mg/dl (ref 0–0)
GLUCOSE, URINE: NEGATIVE mg/dl (ref 0–0)
KETONE, URINE: NEGATIVE mg/dl (ref 0–0)
LEUKOCYTE ESTERASE: NEGATIVE Leu/mcl (ref 0–0)
NITRITE, URINE: NEGATIVE
OCCULT BLOOD, URINE: NEGATIVE mg/dl (ref 0–0)
PH URINE: 6 (ref 5.0–8.0)
PROTEIN, URINE: NEGATIVE mg/dl (ref 0–15)
SPECIFIC GRAVITY URINE: 1.025 (ref 1.003–1.030)
UROBILINOGEN URINE: 0.2 mg/dl (ref 0.2–1.0)

## 2005-10-08 NOTE — Patient Instructions (Addendum)
Obs appts  4 wks - chaudhuri  8 wks - Evelyn, GLT  11 wks - chaudhuri  13 wks - Evelyn  15 wks - chaudhuri  16 wks - Evelyn  17 wks - chaudhuri  18 wks - evelyn  19 wks - chaudhuri  20 wks - chaudhuri    [place] for [facility] in November    Childbirth classes EDD 02/26/2006

## 2005-10-09 LAB — XR CHEST 1 VIEW

## 2005-10-09 LAB — URINE CULTURE/COLONY COUNT

## 2005-10-13 ENCOUNTER — Telehealth (HOSPITAL_BASED_OUTPATIENT_CLINIC_OR_DEPARTMENT_OTHER): Payer: Self-pay

## 2005-10-13 NOTE — Telephone Encounter (Signed)
EDC 02/24/06. 20weeks and 4 days pregnant. Woke up this morning with a tooth ache. Wants to know if tylenol is ok to take for tooth pain. Informed pt that tylenol is ok during pregnancy. Advised pt to call her dentist to evaluate why she is having tooth pain. Pt agrees with plan. Forwarded to Dr.Chery for plan approval.

## 2005-10-13 NOTE — Telephone Encounter (Signed)
Staff Message copied by Ardeen Fillers on 10/13/2005 at 2:23 PM  ------   Message from: Kerney Elbe   Created: 10/13/2005 at 1:43 PM    Alejandra Martin [id number], 35 year old, female, [phone] (home) [phone] (work)    [name] NUMBER: [phone]  Cell phone:   Other phone:    Available times:    Patient's language of care: English    Patient does not need an interpreter.    Patient's PCP: [name] MD, MD    Person calling on behalf of patient: patient (self)    Calls today with a sick call. Ob patient wants to know what medicine she can take for a tooth ache.    Patient's Preferred Pharmacy:   [facility] ([facility]  Phone: [phone] Fax: [phone]    [facility]  Phone: [phone] Fax: [phone]

## 2005-10-14 NOTE — Telephone Encounter (Signed)
Forwarded to Dr.Chery

## 2005-10-16 ENCOUNTER — Ambulatory Visit: Payer: Self-pay | Admitting: Dentist

## 2005-10-16 DIAGNOSIS — IMO0002 Reserved for concepts with insufficient information to code with codable children: Secondary | ICD-10-CM

## 2005-10-16 NOTE — Telephone Encounter (Signed)
Case reviewed with Ardeen Fillers and agree with plan.  Luan Pulling, MD

## 2005-10-27 ENCOUNTER — Ambulatory Visit (HOSPITAL_BASED_OUTPATIENT_CLINIC_OR_DEPARTMENT_OTHER): Payer: PRIVATE HEALTH INSURANCE

## 2005-10-27 DIAGNOSIS — F411 Generalized anxiety disorder: Secondary | ICD-10-CM | POA: Insufficient documentation

## 2005-10-27 DIAGNOSIS — F331 Major depressive disorder, recurrent, moderate: Secondary | ICD-10-CM | POA: Insufficient documentation

## 2005-10-27 DIAGNOSIS — F321 Major depressive disorder, single episode, moderate: Secondary | ICD-10-CM

## 2005-10-27 NOTE — Progress Notes (Signed)
PSYCHIATRY OUTPATIENT PROGRESS NOTE      VISIT TYPE: Psychopharmacology      INTERPRETER: No interpreter needed.      PROBLEMS which this visit addressed:   Problem 1: anxiety      Problem 2: low energy     Problem 3: Nervous about labor/pregnancy        SOURCE(S) OF INFORMATION: Patient      SUBJECTIVE FINDINGS: "I"m feeling good, I'm due in early January."     Signs and symptoms: Pt stopped taking citalopram 10mg  in late March when she found out she was pregnant. Pt got pregnant naturally, was planning to go to a fertility clinic but that was not necessary. Pt feels she was able to get pregnant b/c she was more relaxed on the antidepressant. medication. Pt reports she is feeling well with pregnancy, only with fatgiue in the afternoons. Is able to nap when she gets home from work. Good energy. Good appetite, drinking plenty of water. Sleeping well. Feels only slightly anxious, mainly getting nervous for birthing process, is planning to take a class with husband at the birthing center soon. No panic attacks. Mood is euthymic. Just got back from 3 week trip to [place] which she loved. Husband supportive. Minimal exercise, plans to start walking in the evening now that the weather is cooler.     OBJECTIVE FINDINGS:   Pertinent positive and negative parts of mental status exam:   Appearance:Neatly dressed overweight pregnant female, looks stated age   Behavior: Pleasant and cooperative, good eye contact   Alertness: Alert and oriented x3, good concentration and attention   Speech: Normal rate, rhythm and volume   Mood: euthymic   Affect:Intense, full range, congruent with content of thought,   Thought Process: logical and linear, goal directed   Thought Content: baby girl, loved trip to [place]; no obsessions or delusions noted   Perceptions: Denies any A/V hallucinations or altered perceptions   Judgment/Impulse Control: Intact   Insight: Limited insight and knowledge about depression/anxiety, psychopharmacology    Cognition: Short term and remote memory intact,difficulty abstracting    Suicidal/Homicidal: Denies any SI/HI         Current medications (n/a for psychotherapy only visits):   Current outpatient prescriptions: NATACHEW OR CHEW, two PO QD, Disp: 200, Rfl: 3; PRENATAL RX OR TABS, 1 TABLET DAILY, Disp: 90, Rfl: 3; AMPICILLIN 500 MG OR CAPS, 1 CAPSULE EVERY 6 HOURS ON EMPTY STOMACH for a week followed by one capsule a day for the remainder of the pregnancy., Disp: 100, Rfl: 1     Medications taken as prescribed (n/a for psychotherapy only visits): Yes      Medication side effects - including movement disorders/AIMS score (n/a for psychotherapy only visits):n/a     Testing results: No test results pending.      Risk behaviors: None reported.         ASSESSMENT:   Clinical formulation: Pt presents at euthymic and without signfican anxiety, functioning well. Healthy pregnancy so far, she and husband very excited about baby. She is getting her regular prenatal care. Pt managing routine anxiety r/t childbirth that most expectant mothers have well. Talking to her OB/GYM MD about this and plans to go to birthing class. Encouraged pt to walk more as tolerated to help with mild anxiety and to stay in shape during pregnancy. We discussed the need to assess pt for post partum depression/exacerbation of anxiety once she has the baby. Discussed the known risks of SSRIs and breastfeeding.  Pt is hopeful she will not need to go back on SSRI during breastfeeding phase, but is open to discussing that futher if sx increase. SHe is going to quit work once she has the baby, and likely stay home for 6 mos and then perhaps go back PT.      Clinical interventions today and patient's response: Discussed symptoms, repsonse to medication and medication changes made today. Pt actively participated in conversations and agrees with plan.      Dual diagnosis stage of change: No dual diagnosis      Medical necessity for today's visit: Assessment  and medical management of acute psychiatric sx      Risk level per scale:    Suicide: low (1)    Violence: low (1)    Addiction: low (1)      DIAGNOSES:   Axis I (primary): Major Depressive Episode, Single Episode, Moderate   Axis I: GAD   Axis II: deferred   Axis III: Hpylori   Axis IV: unemployed   Axis V (current): 75  Axis V (highest in past year): 75         PLAN: No psychiatric meds for now. Pt to f/u in early December. Patient agreed to call before the next appointment if there is an increase in symptoms that cannot wait until the appointment. Patient agreed to call or go to the Psychiatric Emergency Service [phone]), or to his/her nearest Emergency Room, for urgent assistance during evenings, nights, weekends and holidays or at anytime if feeling at risk of imminent harm.      Risk plan (for patients at moderate/high risk for suicide/violence/addiction): Patient not at moderate or high risk.      FOR PSYCHOPHARMACOLOGY VISITS ONLY   Pregnancy status: pregnant     Medication plan: See plan      Medication education: n/a     Medical work-up plan/testing: None indicated.      Instructions to covering prescriber: n/a     Amount of time spent w/patient today: 30 min      Han Lysne Ellard Artis, RNCS

## 2005-11-05 ENCOUNTER — Ambulatory Visit (HOSPITAL_BASED_OUTPATIENT_CLINIC_OR_DEPARTMENT_OTHER): Payer: PRIVATE HEALTH INSURANCE | Admitting: Obstetrics & Gynecology

## 2005-11-05 ENCOUNTER — Encounter (HOSPITAL_BASED_OUTPATIENT_CLINIC_OR_DEPARTMENT_OTHER): Payer: Self-pay | Admitting: Obstetrics & Gynecology

## 2005-11-05 VITALS — BP 120/76 | Wt 226.0 lb

## 2005-11-05 DIAGNOSIS — Z3480 Encounter for supervision of other normal pregnancy, unspecified trimester: Secondary | ICD-10-CM

## 2005-11-05 LAB — URINE DIP (POINT OF CARE)
BILIRUBIN, URINE: NEGATIVE mg/dl (ref 0–0)
GLUCOSE, URINE: NEGATIVE mg/dl (ref 0–0)
KETONE, URINE: NEGATIVE mg/dl (ref 0–0)
LEUKOCYTE ESTERASE: NEGATIVE Leu/mcl (ref 0–0)
NITRITE, URINE: NEGATIVE
OCCULT BLOOD, URINE: NEGATIVE mg/dl (ref 0–0)
PH URINE: 6 (ref 5.0–8.0)
PROTEIN, URINE: NEGATIVE mg/dl (ref 0–15)
SPECIFIC GRAVITY URINE: 1.025 (ref 1.003–1.030)
UROBILINOGEN URINE: 0.2 mg/dl (ref 0.2–1.0)

## 2005-12-03 ENCOUNTER — Ambulatory Visit (HOSPITAL_BASED_OUTPATIENT_CLINIC_OR_DEPARTMENT_OTHER): Payer: PRIVATE HEALTH INSURANCE | Admitting: Nurse Practitioner

## 2005-12-03 VITALS — BP 110/70 | Wt 227.5 lb

## 2005-12-03 DIAGNOSIS — Z3400 Encounter for supervision of normal first pregnancy, unspecified trimester: Secondary | ICD-10-CM

## 2005-12-03 DIAGNOSIS — Z8759 Personal history of other complications of pregnancy, childbirth and the puerperium: Secondary | ICD-10-CM | POA: Insufficient documentation

## 2005-12-03 LAB — URINE DIP (POINT OF CARE)
BILIRUBIN, URINE: NEGATIVE mg/dl (ref 0–0)
GLUCOSE, URINE: NEGATIVE mg/dl (ref 0–0)
LEUKOCYTE ESTERASE: NEGATIVE Leu/mcl (ref 0–0)
NITRITE, URINE: NEGATIVE
OCCULT BLOOD, URINE: NEGATIVE mg/dl (ref 0–0)
PH URINE: 5.5 (ref 5.0–8.0)
PROTEIN, URINE: NEGATIVE mg/dl (ref 0–15)
SPECIFIC GRAVITY URINE: 1.025 (ref 1.003–1.030)
UROBILINOGEN URINE: 0.2 mg/dl (ref 0.2–1.0)

## 2005-12-03 LAB — HEMATOCRIT: HEMATOCRIT: 31.4 % — ABNORMAL LOW (ref 37.0–47.0)

## 2005-12-03 NOTE — Patient Instructions (Signed)
Childbirth classes EDD 02/26/06

## 2005-12-04 ENCOUNTER — Telehealth (HOSPITAL_BASED_OUTPATIENT_CLINIC_OR_DEPARTMENT_OTHER): Payer: Self-pay | Admitting: Nurse Practitioner

## 2005-12-04 NOTE — Telephone Encounter (Signed)
GLT 112  Hct 31.4    Message left on voice mail with above information and to take ferrous sulfate 325 mg daily with PN vitamins

## 2005-12-08 LAB — TREPONEMA PALLIDUM AB IGG: TREPONEMA PALLIDUM AB IgG: NONREACTIVE

## 2005-12-08 LAB — GLUCOSE 1 HR POST 50 GRAM DOSE: GLUCOSE 1 HR POST 50 GRAM DOSE: 112 mg/dl (ref ?–139)

## 2005-12-09 ENCOUNTER — Telehealth (HOSPITAL_BASED_OUTPATIENT_CLINIC_OR_DEPARTMENT_OTHER): Payer: Self-pay | Admitting: Nurse Practitioner

## 2005-12-09 NOTE — Telephone Encounter (Signed)
TC to pt Pt states she is taking her PNV twice a day(Natachew) and ferrous sulfate 325 mg twice a day. Told pt to continue with that regime, she is still mildly anemic.

## 2005-12-09 NOTE — Telephone Encounter (Signed)
Mild anemia - please confirm that is taking OTC ferrous sulfate 325 mg bid

## 2005-12-23 ENCOUNTER — Encounter (HOSPITAL_BASED_OUTPATIENT_CLINIC_OR_DEPARTMENT_OTHER): Payer: PRIVATE HEALTH INSURANCE

## 2005-12-23 ENCOUNTER — Encounter (HOSPITAL_BASED_OUTPATIENT_CLINIC_OR_DEPARTMENT_OTHER): Payer: PRIVATE HEALTH INSURANCE | Admitting: Obstetrics & Gynecology

## 2005-12-24 ENCOUNTER — Encounter (HOSPITAL_BASED_OUTPATIENT_CLINIC_OR_DEPARTMENT_OTHER): Payer: PRIVATE HEALTH INSURANCE | Admitting: Nurse Practitioner

## 2005-12-25 ENCOUNTER — Ambulatory Visit (HOSPITAL_BASED_OUTPATIENT_CLINIC_OR_DEPARTMENT_OTHER): Payer: PRIVATE HEALTH INSURANCE | Admitting: Obstetrics & Gynecology

## 2005-12-25 VITALS — BP 100/60 | Wt 236.0 lb

## 2005-12-25 DIAGNOSIS — Z3480 Encounter for supervision of other normal pregnancy, unspecified trimester: Secondary | ICD-10-CM

## 2005-12-25 LAB — URINE DIP (POINT OF CARE)
BILIRUBIN, URINE: NEGATIVE mg/dl (ref 0–0)
GLUCOSE, URINE: NEGATIVE mg/dl (ref 0–0)
KETONE, URINE: NEGATIVE mg/dl (ref 0–0)
LEUKOCYTE ESTERASE: NEGATIVE Leu/mcl (ref 0–0)
NITRITE, URINE: NEGATIVE
OCCULT BLOOD, URINE: NEGATIVE mg/dl (ref 0–0)
PH URINE: 6 (ref 5.0–8.0)
PROTEIN, URINE: NEGATIVE mg/dl (ref 0–15)
SPECIFIC GRAVITY URINE: 1.02 (ref 1.003–1.030)
UROBILINOGEN URINE: NEGATIVE mg/dl (ref 0.2–1.0)

## 2006-01-06 ENCOUNTER — Ambulatory Visit (HOSPITAL_BASED_OUTPATIENT_CLINIC_OR_DEPARTMENT_OTHER): Payer: PRIVATE HEALTH INSURANCE

## 2006-01-06 ENCOUNTER — Encounter (HOSPITAL_BASED_OUTPATIENT_CLINIC_OR_DEPARTMENT_OTHER): Payer: Self-pay

## 2006-01-06 ENCOUNTER — Ambulatory Visit (HOSPITAL_BASED_OUTPATIENT_CLINIC_OR_DEPARTMENT_OTHER): Payer: PRIVATE HEALTH INSURANCE | Admitting: Nurse Practitioner

## 2006-01-06 VITALS — BP 120/70 | Wt 243.5 lb

## 2006-01-06 DIAGNOSIS — Z3009 Encounter for other general counseling and advice on contraception: Secondary | ICD-10-CM

## 2006-01-06 DIAGNOSIS — Z8759 Personal history of other complications of pregnancy, childbirth and the puerperium: Secondary | ICD-10-CM | POA: Insufficient documentation

## 2006-01-06 DIAGNOSIS — Z3400 Encounter for supervision of normal first pregnancy, unspecified trimester: Secondary | ICD-10-CM

## 2006-01-06 LAB — URINE DIP (POINT OF CARE)
BILIRUBIN, URINE: NEGATIVE mg/dl (ref 0–0)
GLUCOSE, URINE: NEGATIVE mg/dl (ref 0–0)
KETONE, URINE: NEGATIVE mg/dl (ref 0–0)
LEUKOCYTE ESTERASE: NEGATIVE Leu/mcl (ref 0–0)
NITRITE, URINE: NEGATIVE
OCCULT BLOOD, URINE: NEGATIVE mg/dl (ref 0–0)
PH URINE: 6.5 (ref 5.0–8.0)
PROTEIN, URINE: NEGATIVE mg/dl (ref 0–15)
SPECIFIC GRAVITY URINE: 1.015 (ref 1.003–1.030)
UROBILINOGEN URINE: 0.2 mg/dl (ref 0.2–1.0)

## 2006-01-06 NOTE — Patient Instructions (Signed)
U/s before 12/4 appt with Dr. Raylene Miyamoto

## 2006-01-06 NOTE — Progress Notes (Signed)
Subjective: FPC    History:35 yo female    No chief complaint on file.      Current outpatient prescriptions:  AMPICILLIN 500 MG OR CAPS, 1 CAPSULE EVERY 6 HOURS ON EMPTY STOMACH for a week followed by one capsule a day for the remainder of the pregnancy., Disp: 100, Rfl: 1  NATACHEW OR CHEW, two PO QD, Disp: 200, Rfl: 3  PRENATAL RX OR TABS, 1 TABLET DAILY, Disp: 90, Rfl: 3      Allergies: Erythromycin    Substance Use: no   Alcohol Use: No      Drug Use: No      Tobacco Use: Never       Patient is pregnant - 33 weeks     Menarche: 35 yo    Regular Cycles?:yes (how often: 28-30 # days: 3 )    Gravida: 2 Para: 0 SAB: 1     DV Assessment Completed:yes    Hx of DV/IP abuse: no    Hx of Sexual Assault: no    Current Partner: same partner for x 10 yrs  # of partners: 1    Sexual Partners: male    Current method(s) of contraception:pregnant    Contraceptive History: ocp for x 5 yrs d/c ocp to get pregnant    Date of last SA: x two weeks ago    Protection used: no    Condom Use: never    Condom instruction done: yes    HX of STDs: none    Risk Assessment Completed: yes  Piercing/Tattoo: no    IV Drug Use: no    Risk Reduction Planning Completed     GYN:  Last GYN exam (date/result): x 5 months ago    Hx of Abnormal PAP: no    Family Planning  All methods of BC discussed, including their side effects, risks, benefits and effectiveness.  Pt coulnd't decide today what method she wants.  Pt instructed to make app with [facility] when she decide a method.  All method of BC hand out given.

## 2006-01-07 ENCOUNTER — Encounter (HOSPITAL_BASED_OUTPATIENT_CLINIC_OR_DEPARTMENT_OTHER): Payer: PRIVATE HEALTH INSURANCE | Admitting: Nurse Practitioner

## 2006-01-07 LAB — URINE CULTURE/COLONY COUNT

## 2006-01-12 ENCOUNTER — Telehealth (HOSPITAL_BASED_OUTPATIENT_CLINIC_OR_DEPARTMENT_OTHER): Payer: Self-pay

## 2006-01-12 ENCOUNTER — Ambulatory Visit (HOSPITAL_BASED_OUTPATIENT_CLINIC_OR_DEPARTMENT_OTHER): Payer: Self-pay | Admitting: Obstetrics & Gynecology

## 2006-01-12 LAB — URINALYSIS
BILIRUBIN, URINE: NEGATIVE
GLUCOSE, URINE: NEGATIVE MG/DL
KETONE, URINE: NEGATIVE MG/DL
LEUKOCYTE ESTERASE: NEGATIVE
NITRITE, URINE: NEGATIVE
OCCULT BLOOD, URINE: NEGATIVE
PH URINE: 6.5 (ref 5.0–8.0)
PROTEIN, URINE: NEGATIVE MG/DL
SPECIFIC GRAVITY URINE: 1.008 (ref 1.003–1.035)

## 2006-01-12 NOTE — Telephone Encounter (Signed)
Staff Message copied by Ardeen Fillers on 01/12/2006 at 2:48 PM  ------   Message from: Kerney Elbe   Created: 01/12/2006 at 2:35 PM    Elray Mcgregor [id number], 35 year old, female, Telephone Information:  Home Phone [phone]  Work Phone [phone]      [name] NUMBER: [phone]  Cell phone:   Other phone:    Available times:    Patient's language of care: English    Patient does not need an interpreter.    Patient's PCP: [name] MD, MD    Person calling on behalf of patient: patient (self)    Calls today with a sick call. Lower abdominal discomfort.    Patient's Preferred Pharmacy:   [facility] ([facility])  Phone: [phone] Fax: [phone]    [facility]  Phone: [phone] Fax: [phone]

## 2006-01-12 NOTE — Telephone Encounter (Signed)
TC pt. Left message on pt voice mail that [facility] is returning her call and to call clinic back at [phone]

## 2006-01-12 NOTE — Telephone Encounter (Signed)
TC to pt Pt is 33 4/[redacted] weeks pregnant c/o lower abdominal cramping, like menstrual cramps since Saturday but getting more frequent yesterday. Denies bleeding or LOF. Positive FM, states is eating and drinking OK.   Has U/S on 01/16/06 and tried to get it sooner but was told the doctor needed to change the appt. States is concerned she may be farther along in her pregnancy. I tried to reassure pt that Friday was OK for U/S but she wants to know if it can be sooner.

## 2006-01-12 NOTE — Telephone Encounter (Signed)
Staff Message copied by Lorri Frederick on 01/12/2006 at 3:42 PM  ------   Message from: Toni Amend   Created: 01/12/2006 at 3:36 PM    Alejandra Martin [id number], 35 year old, female, Telephone Information:  Home Phone [phone]  Work Phone [phone]      CALL BACK NUMBER: HOME  Cell phone:   Other phone:    Available times:    Patient's language of care: English    Patient does not need an interpreter.    Patient's PCP: [name] MD, MD    Person calling on behalf of patient: patient (self)    Calls today with OB problem: Returning phone call.

## 2006-01-12 NOTE — Telephone Encounter (Signed)
TC to pt Informed pt that I spoke with triage person and she would like pt to go to L&D and be evaluated for contractions. Pt states her husband will be home in 1/2 hr and she will go then.

## 2006-01-13 LAB — URINE CULTURE/COLONY COUNT: URINE CULTURE/COLONY COUNT: NO GROWTH

## 2006-01-16 ENCOUNTER — Other Ambulatory Visit: Payer: Self-pay | Admitting: Nurse Practitioner

## 2006-01-16 DIAGNOSIS — Z8759 Personal history of other complications of pregnancy, childbirth and the puerperium: Secondary | ICD-10-CM | POA: Insufficient documentation

## 2006-01-16 LAB — US OB AMNIOTIC FLUID INDEX

## 2006-01-16 LAB — US OB FOLLOW UP

## 2006-01-20 ENCOUNTER — Ambulatory Visit (HOSPITAL_BASED_OUTPATIENT_CLINIC_OR_DEPARTMENT_OTHER): Payer: PRIVATE HEALTH INSURANCE | Admitting: Obstetrics & Gynecology

## 2006-01-20 VITALS — BP 114/70 | Wt 243.0 lb

## 2006-01-20 DIAGNOSIS — Z3480 Encounter for supervision of other normal pregnancy, unspecified trimester: Secondary | ICD-10-CM

## 2006-01-20 DIAGNOSIS — Z23 Encounter for immunization: Secondary | ICD-10-CM

## 2006-01-20 LAB — URINE DIP (POINT OF CARE)
BILIRUBIN, URINE: NEGATIVE mg/dl (ref 0–0)
GLUCOSE, URINE: NEGATIVE mg/dl (ref 0–0)
KETONE, URINE: NEGATIVE mg/dl (ref 0–0)
LEUKOCYTE ESTERASE: NEGATIVE Leu/mcl (ref 0–0)
NITRITE, URINE: NEGATIVE
OCCULT BLOOD, URINE: NEGATIVE mg/dl (ref 0–0)
PH URINE: 6 (ref 5.0–8.0)
PROTEIN, URINE: NEGATIVE mg/dl (ref 0–15)
SPECIFIC GRAVITY URINE: 1.025 (ref 1.003–1.030)
UROBILINOGEN URINE: 0.2 mg/dl (ref 0.2–1.0)

## 2006-01-20 NOTE — Progress Notes (Signed)
Influenza Vaccine Procedure  January 20, 2006    1. Has the patient received the information for the influenza vaccine? Yes    2. Does the patient have any of the following contraindications?  Allergy to eggs? No  Allergic reaction to previous influenza vaccines? No  Any other problems to previous influenza vaccines? No  Paralyzed by Guillain-Barre syndrome? No  Currently pregnant? Yes, 02/26/06.  Current moderate or severe illness? No  Allergy to contact lens solution? No    3. The vaccine has been administered in the usual fashion and the patient/guardian was instructed to wait 20 minutes before leaving the building in the event of an allergic reaction:     Immunization information and VIS for flu vaccine(s) for 2007-2008 reviewed; verbal consent given by patient/guardian.

## 2006-01-21 ENCOUNTER — Encounter (HOSPITAL_BASED_OUTPATIENT_CLINIC_OR_DEPARTMENT_OTHER): Payer: Self-pay | Admitting: Nurse Practitioner

## 2006-01-21 LAB — URINE CULTURE/COLONY COUNT: URINE CULTURE/COLONY COUNT: NO GROWTH

## 2006-01-28 ENCOUNTER — Ambulatory Visit (HOSPITAL_BASED_OUTPATIENT_CLINIC_OR_DEPARTMENT_OTHER): Payer: PRIVATE HEALTH INSURANCE | Admitting: Nurse Practitioner

## 2006-01-28 VITALS — BP 100/70 | Wt 251.0 lb

## 2006-01-28 DIAGNOSIS — Z3400 Encounter for supervision of normal first pregnancy, unspecified trimester: Secondary | ICD-10-CM

## 2006-01-28 LAB — URINE DIP (POINT OF CARE)
BILIRUBIN, URINE: NEGATIVE mg/dl (ref 0–0)
GLUCOSE, URINE: NORMAL mg/dl (ref 0–0)
KETONE, URINE: NEGATIVE mg/dl (ref 0–0)
LEUKOCYTE ESTERASE: NEGATIVE Leu/mcl (ref 0–0)
NITRITE, URINE: NEGATIVE
OCCULT BLOOD, URINE: NEGATIVE mg/dl (ref 0–0)
PH URINE: 6.5 (ref 5.0–8.0)
PROTEIN, URINE: NEGATIVE mg/dl (ref 0–15)
SPECIFIC GRAVITY URINE: 1.02 (ref 1.003–1.030)
UROBILINOGEN URINE: NORMAL mg/dl (ref 0.2–1.0)

## 2006-02-01 LAB — GENITAL GROUP B STREP: GENITAL GROUP B STREP: NEGATIVE

## 2006-02-02 ENCOUNTER — Ambulatory Visit (HOSPITAL_BASED_OUTPATIENT_CLINIC_OR_DEPARTMENT_OTHER): Payer: PRIVATE HEALTH INSURANCE

## 2006-02-03 ENCOUNTER — Ambulatory Visit (HOSPITAL_BASED_OUTPATIENT_CLINIC_OR_DEPARTMENT_OTHER): Payer: PRIVATE HEALTH INSURANCE | Admitting: Obstetrics & Gynecology

## 2006-02-03 VITALS — BP 110/70 | Wt 250.0 lb

## 2006-02-03 DIAGNOSIS — Z3480 Encounter for supervision of other normal pregnancy, unspecified trimester: Secondary | ICD-10-CM

## 2006-02-03 LAB — URINE DIP (POINT OF CARE)
BILIRUBIN, URINE: NEGATIVE mg/dl (ref 0–0)
GLUCOSE, URINE: NEGATIVE mg/dl (ref 0–0)
KETONE, URINE: NEGATIVE mg/dl (ref 0–0)
LEUKOCYTE ESTERASE: NEGATIVE Leu/mcl (ref 0–0)
NITRITE, URINE: NEGATIVE
OCCULT BLOOD, URINE: NEGATIVE mg/dl (ref 0–0)
PH URINE: 6 (ref 5.0–8.0)
PROTEIN, URINE: NEGATIVE mg/dl (ref 0–15)
SPECIFIC GRAVITY URINE: 1.02 (ref 1.003–1.030)
UROBILINOGEN URINE: 0.2 mg/dl (ref 0.2–1.0)

## 2006-02-03 MED ORDER — AMPICILLIN 500 MG PO CAPS
ORAL_CAPSULE | ORAL | Status: AC
Start: 2006-02-03 — End: 2006-03-06

## 2006-02-09 ENCOUNTER — Other Ambulatory Visit (HOSPITAL_BASED_OUTPATIENT_CLINIC_OR_DEPARTMENT_OTHER): Payer: Self-pay | Admitting: Obstetrics & Gynecology

## 2006-02-09 ENCOUNTER — Telehealth (HOSPITAL_BASED_OUTPATIENT_CLINIC_OR_DEPARTMENT_OTHER): Payer: Self-pay | Admitting: "Women's Health Care

## 2006-02-09 NOTE — Telephone Encounter (Signed)
TC to pt who states she has decreased fetal movement since Saturday, along with a pink vaginal discharge. Also states has had some sharp pains and cramping like a period since last night. Denies nausea, states has no appetite and is c/o HA.    I spoke with Dr. Eugenie Norrie and pt is to go to L&D to be evaluated.    Informed pt to go to L&D now to be evaluated.  Pt will do that.

## 2006-02-09 NOTE — Telephone Encounter (Signed)
Staff Message copied by Lorri Frederick on 02/09/2006 at 12:34 PM  ------   Message from: Encarnacion Chu   Created: 02/09/2006 at 12:24 PM   Regarding: OB HIGH   Contact: [phone]    Alejandra Martin [id number], 35 year old, female, Telephone Information:  Home Phone [phone]  Work Phone [phone]      [name] NUMBER: [phone]  Cell phone:   Other phone:    Available times:    Patient's language of care: English    Patient does not need an interpreter.    Patient's PCP: Zenaida Deed. Petra Kuba, MD    Person calling on behalf of patient: patient (self)    Calls today with OB problem:   38 wks preg having pinkish discharge, cramps and not feeling the baby moving since Saturday.

## 2006-02-11 ENCOUNTER — Ambulatory Visit (HOSPITAL_BASED_OUTPATIENT_CLINIC_OR_DEPARTMENT_OTHER): Payer: PRIVATE HEALTH INSURANCE | Admitting: Nurse Practitioner

## 2006-02-11 VITALS — BP 100/70 | Wt 258.0 lb

## 2006-02-11 DIAGNOSIS — Z3400 Encounter for supervision of normal first pregnancy, unspecified trimester: Secondary | ICD-10-CM

## 2006-02-11 LAB — URINE DIP (POINT OF CARE)
BILIRUBIN, URINE: NEGATIVE mg/dl (ref 0–0)
GLUCOSE, URINE: NEGATIVE mg/dl (ref 0–0)
KETONE, URINE: NEGATIVE mg/dl (ref 0–0)
LEUKOCYTE ESTERASE: NEGATIVE Leu/mcl (ref 0–0)
NITRITE, URINE: NEGATIVE
OCCULT BLOOD, URINE: NEGATIVE mg/dl (ref 0–0)
PH URINE: 6 (ref 5.0–8.0)
PROTEIN, URINE: NEGATIVE mg/dl (ref 0–15)
SPECIFIC GRAVITY URINE: 1.025 (ref 1.003–1.030)
UROBILINOGEN URINE: NORMAL mg/dl (ref 0.2–1.0)

## 2006-02-18 ENCOUNTER — Encounter (HOSPITAL_BASED_OUTPATIENT_CLINIC_OR_DEPARTMENT_OTHER): Payer: PRIVATE HEALTH INSURANCE | Admitting: Obstetrics & Gynecology

## 2006-02-19 ENCOUNTER — Other Ambulatory Visit: Payer: Self-pay | Admitting: Obstetrics & Gynecology

## 2006-02-19 LAB — US UMBILICAL ARTERY

## 2006-02-19 LAB — US OB AMNIOTIC FLUID INDEX

## 2006-02-19 LAB — US OB FOLLOW UP

## 2006-02-24 ENCOUNTER — Telehealth (HOSPITAL_BASED_OUTPATIENT_CLINIC_OR_DEPARTMENT_OTHER): Payer: Self-pay | Admitting: Ambulatory Care

## 2006-02-24 ENCOUNTER — Ambulatory Visit (HOSPITAL_BASED_OUTPATIENT_CLINIC_OR_DEPARTMENT_OTHER): Payer: PRIVATE HEALTH INSURANCE | Admitting: Obstetrics & Gynecology

## 2006-02-24 VITALS — BP 120/70 | Wt 258.0 lb

## 2006-02-24 DIAGNOSIS — Z3480 Encounter for supervision of other normal pregnancy, unspecified trimester: Secondary | ICD-10-CM

## 2006-02-24 LAB — URINE DIP (POINT OF CARE)
BILIRUBIN, URINE: NEGATIVE mg/dl (ref 0–0)
GLUCOSE, URINE: NEGATIVE mg/dl (ref 0–0)
KETONE, URINE: NEGATIVE mg/dl (ref 0–0)
LEUKOCYTE ESTERASE: NEGATIVE Leu/mcl (ref 0–0)
NITRITE, URINE: NEGATIVE
PH URINE: 6 (ref 5.0–8.0)
PROTEIN, URINE: NEGATIVE mg/dl (ref 0–15)
SPECIFIC GRAVITY URINE: 1.025 (ref 1.003–1.030)
UROBILINOGEN URINE: 0.2 mg/dl (ref 0.2–1.0)

## 2006-02-24 NOTE — Telephone Encounter (Signed)
Rec incoming call from pt who is a 36 yo G2P1 @ 39 weeks and 5 days with c/o light brown spotting since last night, notices it on toilet tissue after wiping.Pt has hx of spotting approx 1 week ago, per pt it had resolved till last night. Denies abd pain or ROM.States last FM approx 15 minutes ago, unsure if FM is decreased or not.Has appt at 1:00pm today. Reviewed with E Neiman NP, pt instructed to eat/drink now then lay on left side and monitor FM.Pt to call if no FM after 1/2 hour or if she feels like baby is moving less.Pt to keep appt with MD @ 1PM today for eval

## 2006-02-25 ENCOUNTER — Encounter (HOSPITAL_BASED_OUTPATIENT_CLINIC_OR_DEPARTMENT_OTHER): Payer: PRIVATE HEALTH INSURANCE | Admitting: Obstetrics & Gynecology

## 2006-02-26 ENCOUNTER — Telehealth (HOSPITAL_BASED_OUTPATIENT_CLINIC_OR_DEPARTMENT_OTHER): Payer: Self-pay | Admitting: Ambulatory Care

## 2006-02-26 NOTE — Telephone Encounter (Signed)
Pt is a 36 yo G2P0 at term(EDC-today) calls with c/o lower back pain that radiates around to front of abdomen, occurs q 30-60 minutes, lasting 2-3 minutes.These pains started last evening, occ waking pt up.Denies vaginal bleeing, LOF, dysuria.States she noticed clear mucusy vaginal d/c this AM.Also with c/o bilateral swelling in ankles that she noticed today.Denies HA, visual changes or epigastric pain.States swelling is not there when she wakes up but occurs when she is up and walking around  Reasured pt that irreg ctxs and vaginal d/c could mean she is starting labor.Will speak with provider regarding edema in ankles.

## 2006-02-26 NOTE — Telephone Encounter (Signed)
Tc to pt,relayed providers instructions listed below. Pt instructed to monitor abd pain to call if pains are q 5 minutes, ROM or LOF, vaginal bleeding, decreased FM, or if she develops HA, visual changes or epigastric pain.Reassure that edema in ankles not unusual in late pregnancy.

## 2006-02-26 NOTE — Telephone Encounter (Signed)
Would reassure pt, unless develops ha's or epigastric pain, swelling is not an issue, seen by MD yesterday and all fine, continue with routine PNC appts

## 2006-02-27 ENCOUNTER — Telehealth (HOSPITAL_BASED_OUTPATIENT_CLINIC_OR_DEPARTMENT_OTHER): Payer: Self-pay

## 2006-02-27 NOTE — Telephone Encounter (Signed)
Staff Message copied by Ardeen Fillers on 02/27/2006 at 1:28 PM  ------   Message from: Gershon Crane   Created: 02/27/2006 at 1:24 PM    Verneice Caspers [id number], 36 year old, female, Telephone Information:  Home Phone [phone]  Work Phone [phone]      [name] NUMBER: [phone]  Cell phone:   Other phone:    Available times:    Patient's language of care: English    Patient does not need an interpreter.    Patient's PCP: [name] MD    Person calling on behalf of patient: patient (self)    Calls today requesting a letter or form for: DUE DATE

## 2006-02-27 NOTE — Telephone Encounter (Signed)
Pt has noted mucous mixed with blood on toilet tissue after voiding. Onset of sx 10am today. Has also noted lower abd pressure/cramps x2 days. Sx occurs every hr and lasts approx 2 minutes. States fetus is active.    Also requests letter with [facility] in order to receive food stamps. Needs letter faxed to [phone]. Pt states she needs food stamps because she will be out of work for 3 months and her place of employment does not provide her with any benefits. Forwarded to Dr.Chaudhuri

## 2006-02-27 NOTE — Telephone Encounter (Signed)
Discussed pt with Dr.Chauhuri. Per Dr.Chaudhuri pt advised to call clinic or go to L&D if bleeding increases or is bright red. Pt states blood is not bright red and is not noted every time she voids. States she has noted small amt of blood in vaginal mucous only 2-3x since 10am today. Also advised pt to call clinic or go to L&D if cramping/contractions/pressure occur every 3 minutes for one hour per Dr.Chaudhuri. Also advised pt to call clinic with any other questions or concerns. Pt agrees with plan.    Pt informed that per Dr.Chaudhuri letter with her [facility] will be faxed to [phone] ATTN: [place] per pt request. Forwarded to Dr.Chaudhuri. Letter scaned for pt epic chart.

## 2006-02-28 ENCOUNTER — Inpatient Hospital Stay (HOSPITAL_BASED_OUTPATIENT_CLINIC_OR_DEPARTMENT_OTHER): Payer: Self-pay | Admitting: Obstetrics & Gynecology

## 2006-02-28 LAB — TRANSFERASE ALANINE AMINO ALT SGPT: ALANINE AMINOTRANSFERASE: 17 IU/L (ref 7–35)

## 2006-02-28 LAB — BLOOD COUNT COMPLETE AUTOMATED
HEMATOCRIT: 34.5 % — ABNORMAL LOW (ref 36.0–48.0)
HEMATOCRIT: 34.2 % — ABNORMAL LOW (ref 36.0–48.0)
HEMOGLOBIN: 11.6 g/dl — ABNORMAL LOW (ref 12.0–16.0)
HEMOGLOBIN: 11.4 g/dl — ABNORMAL LOW (ref 12.0–16.0)
MEAN CORP HGB CONC: 33.6 g/dl (ref 32.0–36.0)
MEAN CORP HGB CONC: 33.5 g/dl (ref 32.0–36.0)
MEAN CORPUSCULAR HGB: 31.6 pg (ref 27.0–33.0)
MEAN CORPUSCULAR HGB: 31.5 pg (ref 27.0–33.0)
MEAN CORPUSCULAR VOL: 94.1 fl (ref 80.0–100.0)
MEAN CORPUSCULAR VOL: 94.3 fl (ref 80.0–100.0)
MEAN PLATELET VOLUME: 7.6 fl (ref 6.4–10.8)
MEAN PLATELET VOLUME: 7.3 fl (ref 6.4–10.8)
PLATELET COUNT: 287 10*3/uL (ref 150–400)
PLATELET COUNT: 284 10*3/uL (ref 150–400)
RBC DISTRIBUTION WIDTH: 13.8 % (ref 11.5–14.3)
RBC DISTRIBUTION WIDTH: 13.7 % (ref 11.5–14.3)
RED BLOOD CELL COUNT: 3.67 M/uL — ABNORMAL LOW (ref 4.50–5.10)
RED BLOOD CELL COUNT: 3.63 M/uL — ABNORMAL LOW (ref 4.50–5.10)
WHITE BLOOD CELL COUNT: 10.4 10*3/uL (ref 4.0–10.8)
WHITE BLOOD CELL COUNT: 12.4 10*3/uL — ABNORMAL HIGH (ref 4.0–10.8)

## 2006-02-28 LAB — URINALYSIS
BILIRUBIN, URINE: NEGATIVE
CASTS: NONE SEEN PER LPF
CRYSTALS: NONE SEEN
GLUCOSE, URINE: NEGATIVE MG/DL
KETONE, URINE: NEGATIVE MG/DL
LEUKOCYTE ESTERASE: NEGATIVE
NITRITE, URINE: NEGATIVE
PH URINE: 6.5 (ref 5.0–8.0)
PROTEIN, URINE: NEGATIVE MG/DL
SPECIFIC GRAVITY URINE: 1.01 (ref 1.003–1.035)
SQUAMOUS EPITHELIAL CELLS: >10 PER LPF — AB (ref 0–2)

## 2006-02-28 LAB — CHG CREATININE BLOOD: CREATININE: 0.6 mg/dl (ref 0.4–1.2)

## 2006-02-28 LAB — ASSAY OF UREA NITROGEN QUANTITATIVE: BUN (UREA NITROGEN): 7 mg/dl (ref 6–20)

## 2006-02-28 LAB — TRANSFERASE ASPARTATE AMINO AST SGOT: ASPARTATE AMINOTRANSFERASE: 25 IU/L (ref 8–34)

## 2006-02-28 LAB — TYPE AND SCREEN

## 2006-02-28 LAB — CHG ASSAY OF BLOOD/URIC ACID: URIC ACID: 5.5 mg/dl (ref 2.6–8.0)

## 2006-03-01 LAB — OPERATIVE REPORT

## 2006-03-02 ENCOUNTER — Ambulatory Visit (HOSPITAL_BASED_OUTPATIENT_CLINIC_OR_DEPARTMENT_OTHER): Payer: PRIVATE HEALTH INSURANCE

## 2006-03-02 LAB — BLOOD COUNT COMPLETE AUTOMATED
HEMATOCRIT: 29 % — ABNORMAL LOW (ref 36.0–48.0)
HEMOGLOBIN: 9.7 g/dl — ABNORMAL LOW (ref 12.0–16.0)
MEAN CORP HGB CONC: 33.5 g/dl (ref 32.0–36.0)
MEAN CORPUSCULAR HGB: 31.8 pg (ref 27.0–33.0)
MEAN CORPUSCULAR VOL: 95.1 fl (ref 80.0–100.0)
MEAN PLATELET VOLUME: 7.5 fl (ref 6.4–10.8)
PLATELET COUNT: 231 10*3/uL (ref 150–400)
RBC DISTRIBUTION WIDTH: 14.1 % (ref 11.5–14.3)
RED BLOOD CELL COUNT: 3.05 M/uL — ABNORMAL LOW (ref 4.50–5.10)
WHITE BLOOD CELL COUNT: 13.4 10*3/uL — ABNORMAL HIGH (ref 4.0–10.8)

## 2006-03-02 LAB — TREPONEMA PALLIDUM AB IGG: TREPONEMA PALLIDUM AB IgG: NONREACTIVE

## 2006-03-04 ENCOUNTER — Encounter (HOSPITAL_BASED_OUTPATIENT_CLINIC_OR_DEPARTMENT_OTHER): Payer: PRIVATE HEALTH INSURANCE | Admitting: Obstetrics & Gynecology

## 2006-03-11 ENCOUNTER — Telehealth (HOSPITAL_BASED_OUTPATIENT_CLINIC_OR_DEPARTMENT_OTHER): Payer: Self-pay | Admitting: Ambulatory Care

## 2006-03-11 LAB — DISCHARGE SUMMARY

## 2006-03-11 NOTE — Telephone Encounter (Signed)
TC to pt who delivered 1.5 weeks ago and is c/o being very emotional, crying on and off, decreased appetite(eating once a day), not sleeping and having no energy. States the baby likes to be awake from midnight to 4 AM. She is formula feeding infant, spoke to her pediatrician who states it may be gas, is giving infant drops and has appt to see pedi on Friday. Denies feelings of hurting herself or her baby.     I spoke to Dr. Raylene Miyamoto and she states pt should go to the psych ER.     I informed pt of what Dr. Raylene Miyamoto recommends and pt states she does not want to do that. I encouraged pt to go, explaining that the symptoms she described are warning signs of postpartum depression. Pt states she does not want to go.     I spoke to pt at length about trying to sleep when the baby sleeps, asking family to come over and help her, they can watch baby so she can sleep, having people cook meals for her etc. Pt states she will try that and if things do not improve she will go to the psych ER. Pt also has appt with Dr. Raylene Miyamoto on 03/17/06 and I encouraged pt to keep that appointment.

## 2006-03-11 NOTE — Telephone Encounter (Signed)
Staff Message copied by Lorri Frederick on 03/11/2006 at 2:34 PM  ------   Message from: Toni Amend   Created: 03/11/2006 at 2:10 PM    Alejandra Martin [id number], 36 year old, female, Telephone Information:  Home Phone [phone]  Work Phone [phone]      CALL BACK NUMBER: HOME  Cell phone:   Other phone:    Available times:    Patient's language of care: English    Patient does not need an interpreter.    Patient's PCP: [name] MD    Person calling on behalf of patient: patient (self)    Calls today with OB problem: She is feeling a little depress.

## 2006-03-17 ENCOUNTER — Ambulatory Visit (HOSPITAL_BASED_OUTPATIENT_CLINIC_OR_DEPARTMENT_OTHER): Payer: PRIVATE HEALTH INSURANCE | Admitting: Obstetrics & Gynecology

## 2006-03-17 NOTE — Progress Notes (Signed)
S/ C/O slight peri-incisional pain when moving from side to side. Bottle feeding. Unsure as to what she wants to use for birth control. Has been feeling depressed, but this feeling is gradually improving.    O/ wound healing well.    A/ Doing well S/P primary C/S for FTP.     P/ reassured re slight peri-incisional pain. Referred to family planner. Offered psych consult. Pt declines. F/U with [facility] Barefoot for full PP check in 4 weeks.

## 2006-04-15 ENCOUNTER — Encounter (HOSPITAL_BASED_OUTPATIENT_CLINIC_OR_DEPARTMENT_OTHER): Payer: Self-pay | Admitting: Nurse Practitioner

## 2006-04-15 ENCOUNTER — Ambulatory Visit (HOSPITAL_BASED_OUTPATIENT_CLINIC_OR_DEPARTMENT_OTHER): Payer: PRIVATE HEALTH INSURANCE | Admitting: Nurse Practitioner

## 2006-04-15 VITALS — BP 120/80 | Wt 220.0 lb

## 2006-04-15 DIAGNOSIS — Z3480 Encounter for supervision of other normal pregnancy, unspecified trimester: Secondary | ICD-10-CM

## 2006-04-15 MED ORDER — NATACHEW OR CHEW
CHEWABLE_TABLET | ORAL | Status: DC
Start: 2006-04-15 — End: 2006-09-11

## 2006-04-15 NOTE — Patient Instructions (Signed)
Recheck in 2 wks for exam with [facility] and Renea Ee

## 2006-04-15 NOTE — Progress Notes (Signed)
SUBJECTIVE:  36 year old G2P1 for postpartum exam.   I have fully reviewed the prenatal and intrapartum course.   The delivery was 03/01/2006 ago at 40+ weeks gestation.   Outcome: PCS. Labor: 12 hours.   Sex: F, Wt: 9 lbs 8 oz.   Anesthesia: Epidural x 2.  Failure to progress in labor, husband and family in L&D  Bled x 2 wks like menses, now more spotting, incision feels fine-some numbness, no problems with urine or stool, mood initially teary but improving - feels mostly from exhaustion because until this week baby very colicky, no sleeping and husband afraid to take care of her, has seen specialist-diagnosed with reflux and on special formula which is helping, has not contacted psych provider because hasn't had time, feels symptoms are improving and getting more rest now, able to take care of baby but difficulty with house chores and makes her anxious    Pedi - [facility] and transferred to PMD Dr Leia Alf, bottle feeding, sleeping through the night now, gaining weight    Postpartum course: complicated by newborn reflux and irritability    Contraceptive plans: not SA yet, not ready, probable OCP's  I have reviewed the patient's PMH for contraceptive risk factors,   and she has no contraindications to contraception as planned. See   Histories section of EpicCare.    OBJECTIVE:  Appears well, vital sign normal.  Abd - abd obese, non-tender, incision very well healed    Unable to do PE today, unable to put baby down due to irritability and more comfortable holding her, no carrier with her today     ASSESSMENT:  Coping well with difficult newborn and own depression/anxiety history  Very bonded with baby and appropriately attentive, good judgement    PLAN:  -A lot of reassurance given about her mothering skills/instincts and normalcy of initial difficulties  -RTC 2 wks with carrier for PP exam, to see [facility] for ?OCP's vs IUD and PHQ -9 then  -encourage pt to contact psych provider to touch base, agrees

## 2006-04-17 ENCOUNTER — Telehealth (HOSPITAL_BASED_OUTPATIENT_CLINIC_OR_DEPARTMENT_OTHER): Payer: Self-pay

## 2006-04-17 NOTE — Telephone Encounter (Signed)
Outreach call to pt to check in following recent childbirth. Rev'd note from [facility] provider, pt was encouraged to f/u with me. Pt reports the first few weeks were very stressful d/t baby having reflux, not sleeping, etc. Pt was very teary and overwhelmed, states she feels better now. Agrees to appt on 3/10 at 3pm.

## 2006-04-27 ENCOUNTER — Telehealth (HOSPITAL_BASED_OUTPATIENT_CLINIC_OR_DEPARTMENT_OTHER): Payer: Self-pay

## 2006-04-27 ENCOUNTER — Ambulatory Visit (HOSPITAL_BASED_OUTPATIENT_CLINIC_OR_DEPARTMENT_OTHER): Payer: PRIVATE HEALTH INSURANCE

## 2006-04-27 NOTE — Telephone Encounter (Signed)
Pt DNKA for today's f/u psych appt. Called pt and she said she forgot b/c she has been so focused on the baby today b/c she is constipated and has been waiting for pediatrician office to call her back so she can get appt for baby today/tomorrow. Pt states she does not have cell phone so couldn't leave the house. States she does feel anxious, but less so than she did first few weeks with baby. Husband is helping her. She does feel tired r/t reduced sleep with baby. Denies feeling depressed or acutely SI. No thoughts to harm baby, enjoying the baby greatly. Agrees to rescheduled appt on 4/7-apologized for not calling today to cancel. Reminded pt it is very important that she keep this next appt. CC OB/GYN provider.

## 2006-04-28 ENCOUNTER — Encounter (HOSPITAL_BASED_OUTPATIENT_CLINIC_OR_DEPARTMENT_OTHER): Payer: Self-pay

## 2006-04-28 ENCOUNTER — Ambulatory Visit (HOSPITAL_BASED_OUTPATIENT_CLINIC_OR_DEPARTMENT_OTHER): Payer: PRIVATE HEALTH INSURANCE | Admitting: Nurse Practitioner

## 2006-04-28 ENCOUNTER — Ambulatory Visit (HOSPITAL_BASED_OUTPATIENT_CLINIC_OR_DEPARTMENT_OTHER): Payer: PRIVATE HEALTH INSURANCE

## 2006-04-28 VITALS — BP 120/80 | Wt 221.0 lb

## 2006-04-28 DIAGNOSIS — Z3009 Encounter for other general counseling and advice on contraception: Secondary | ICD-10-CM

## 2006-04-28 MED ORDER — ORTHO TRI-CYCLEN (28) 0.18/0.215/0.25 MG-35 MCG PO TABS
ORAL_TABLET | ORAL | Status: DC
Start: 2006-04-28 — End: 2006-06-15

## 2006-04-28 NOTE — Progress Notes (Signed)
Subjective: fpc    History: 35 YO FEMALE     No chief complaint on file.      Current outpatient prescriptions:  NATACHEW OR CHEW, two PO QD, Disp: 200, Rfl: 3  AMPICILLIN 500 MG OR CAPS, 1 CAPSULE EVERY 6 HOURS ON EMPTY STOMACH for a week followed by one capsule a day for the remainder of the pregnancy., Disp: 100, Rfl: 1  PRENATAL RX OR TABS, 1 TABLET DAILY, Disp: 90, Rfl: 3      Allergies: Erythromycin    Substance Use:   Alcohol Use: No      Drug Use: No      Tobacco Use: Never     LMP: ?     Menarche: 36 yo    Regular Cycles?: yes (how often: 30 # days: 3-4 )    Gravida:2 Para: 1 SAB: 1     DV Assessment Completed: yes    Hx of DV/IP abuse: no    Hx of Sexual Assault: no    Current Partner: same partner for 10 yrs  # of partners: 1    Sexual Partners: male    Current method(s) of contraception: abstain    Contraceptive History: tried ocp before d/c it because she gained weight    Date of last SA: ?    Protection used: no    Condom Use: never    Condom instruction done: yes    HX of STDs: none    Risk Assessment Completed: yes  Piercing/Tattoo: no  IV Drug Use: no    Risk Reduction Planning Completed     GYN:  Last GYN exam (date/result): today    Hx of Abnormal PAP: no    Customer service manager and Referrals:    Anatomy/Physiology  Contraceptive overview: Risks, Benefits, Warning Signs, Effectiveness, Side Effects, Instructions, Follow Up  Informed Consent signed for: FP + OCP  STD/HIV Risk Assessment Done  STD/HIV prevention/safer sex discussed  Condoms offered and dispenssed  Emergency Contraception Discussed  Confidentiality  Handouts given    Pt instructed in use condoms at the first two weeks on ocp as a back up method.    Milus Banister [facility]

## 2006-04-28 NOTE — Progress Notes (Signed)
36 year old G2P1011    Her for PE, unable to do exam at [facility] last week    Baby doing much better with reflux, now constipated and just got medicine today  Baby sleeping for 7 hours at a time and crying less  No depression symptoms, but having anxiety c/w what has had in past  PHQ-9 is 4    Not SA yet, to see FPC today to decide about contraception,  Has not gotten menses yet    O: no thyromegaly   Chest - S1&S2 RRR without murmur   Breast - no masses, no tender   Abd - obese, no mass, no tender, sell healed scar   Pelvic exam: EGBUS within normal limits, uterus normal size, shape, consistency, no mass or tenderness, adnexa normal in size without mass or tenderness. RV - small hemorrhoid tag    A: nl PP exam, coping well with different newborn-using good judgement, good bonding, no depressive symptoms but anxiety starting to pick up    P: pap, genprobe, HPV sent   Long discussion about need to address chronic anxiety issue, now that has child she is worrying about which will continue so needs some long term management strategies, agrees and has appt with therapist in 2-3 wks  Pt has opted to use OCP's and will consider IUD in future-orthotricyclen x 4 cycles to start with menses, condoms x 2 wks

## 2006-04-29 ENCOUNTER — Encounter (HOSPITAL_BASED_OUTPATIENT_CLINIC_OR_DEPARTMENT_OTHER): Payer: Self-pay

## 2006-04-29 LAB — CHLAMYDIA GC NAAT
GENPROBE CHLAMYDIA: NEGATIVE
GENPROBE GC: NEGATIVE

## 2006-05-09 LAB — HUMAN PAPILLOMAVIRUS (HPV): HUMAN PAPILLOMAVIRUS: NEGATIVE

## 2006-05-09 LAB — CYTOPATH, C/V, THIN LAYER

## 2006-06-01 ENCOUNTER — Telehealth (HOSPITAL_BASED_OUTPATIENT_CLINIC_OR_DEPARTMENT_OTHER): Payer: Self-pay

## 2006-06-01 NOTE — Telephone Encounter (Signed)
Spoke with pt to let her know 4/28 would be the soonest I could see her since we are closed on 4/21. She denies any acute concerns, but wants to talk more about how to manage her mood/anxiety with little sleep s/t newborn baby. Also asked pt to make appt with Ninfa Linden b/c she was told her network health was going to "run out" next week b/c her baby is 54 mos old now.     From [facility]   Sent Jun 01, 2006 9:43 AM   To Ander Slade   Phone [phone]   Subject wants an early app   Patient Alejandra Martin [[id number]] (DOB: [birth year])   Phone Entered Pt Work Pt Home   [phone]         --------------------------------------------------------------------------------        Message [name] [id number], 36 year old, female, Telephone Information:  Home Phone [phone]  Work Phone [phone]      [name] NUMBER: [phone]  Cell phone:   Other phone:    Available times:    Patient's language of care: English    Patient does not need an interpreter.    Patient's PCP: [name] MD    Person calling on behalf of patient: patient (self)    Calls today to schedule an app, was told that next available is for 4/28, wants to be seen before. Patient can be reach at tel# above. Thanks    Patient's Preferred Pharmacy:   [facility]  Phone: [phone] Fax: [phone]    [facility]  Phone: [phone] Fax: [phone]

## 2006-06-15 ENCOUNTER — Ambulatory Visit (HOSPITAL_BASED_OUTPATIENT_CLINIC_OR_DEPARTMENT_OTHER): Payer: No Typology Code available for payment source

## 2006-06-15 DIAGNOSIS — F411 Generalized anxiety disorder: Secondary | ICD-10-CM

## 2006-06-15 NOTE — Progress Notes (Signed)
PSYCHIATRY OUTPATIENT PROGRESS NOTE      VISIT TYPE: Psychopharmacology eval/psychotherapy     INTERPRETER: No interpreter needed.      PROBLEMS which this visit addressed:   Problem 1: anxiety      Problem 2: low energy     Problem 3: Nervous about labor/pregnancy        SOURCE(S) OF INFORMATION: Patient      SUBJECTIVE FINDINGS: "Sometimes I just can't believe I have a baby!."     Signs and symptoms: Pt 3 mo post partum, first baby after many years of trying to conceive. Baby has reflux and for the first 1-2 mos sx were difficult to manage. She had to swictch pediatricians, and try multiple formulas. Baby was not sleeping and she was highly anxious and not sleeping well herself. Felt very emotional (tired, crying a lot, frustrated, anxious) the first 6 weeks post partum, but reports she is now feeling better. Baby is finally sleeping through the night, so she is getting 6-8 hours of sleep and feels more rested. Less concerned/anxious about reflux sx. Does report sometimes she feels lonely during the day and misses her old job, but denies feeling depressed. Energy level is fair. Good appetite. Has lost 40 lbs since pregnancy. Walking with baby in stroller several times per week. Has libido, but does not want to get pregnant so has not had sex with husband. Did not start OCP given by OB/GYN b/c she has fears of wt gain and acne (which she had in the past with med). Interested in the IUD. Has plans to return to work in 3 mos, husband/mom will provide afternoon childcare. Husband remains supportive and helpful, he encourages her to go out when he is home and to take care of herself.   OBJECTIVE FINDINGS:   Pertinent positive and negative parts of mental status exam:   Appearance:Neatly dressed overweight female, looks stated age   Behavior: Pleasant and cooperative, good eye contact   Alertness: Alert and oriented x3, good concentration and attention   Speech: Normal rate, rhythm and volume   Mood: euthymic    Affect:full range, congruent with content of thought,   Thought Process: logical and linear, goal directed   Thought Content: baby girl, misses job, adjusting to motherhood; no obsessions or delusions noted   Perceptions: Denies any A/V hallucinations or altered perceptions   Judgment/Impulse Control: Intact   Insight: fair  Cognition: Short term and remote memory intact,difficulty abstracting    Suicidal/Homicidal: Denies any SI/HI         Current medications (n/a for psychotherapy only visits):   Current outpatient prescriptions : Current outpatient prescriptions : NATACHEW OR CHEW, two PO QD, Disp: 200, Rfl: 3; PRENATAL RX OR TABS, 1 TABLET DAILY, Disp: 90, Rfl: 3     Medications taken as prescribed (n/a for psychotherapy only visits): n/a     Medication side effects - including movement disorders/AIMS score (n/a for psychotherapy only visits):n/a     Testing results: No test results pending.      Risk behaviors: None reported.         ASSESSMENT:   Clinical formulation: Pt presents at euthymic today; no evidence of depressive disorder. Her anxiety was high right after pregnancy but has returned to her baseline since baby's refulx is now managed. We discussed today the normal feeling/emotions/thoughts that occur with new mom's; she agreed motherhood is a new role and she hadn't talked to anyone about how it feels and today's discussion was helpful.  She is at risk for increased anxiety in this post partum phase, but right now I feel her sx are well controlled and no meds are indicated. I've rev'd the si/sx of depression/significant anxiety with pt and asked her to notify me if sx change. Encouraged her to make time for herself, exercise and discuss IUD with provider so she and husband can resume normal sexual relations. She agrees with plan.     Clinical interventions today and patient's response: Discussed symptoms, management strategies Pt actively participated in conversations and agrees with plan.      Dual  diagnosis stage of change: No dual diagnosis      Medical necessity for today's visit: Assessment and medical management of acute psychiatric sx      Risk level per scale:    Suicide: low (1)    Violence: low (1)    Addiction: low (1)      DIAGNOSES:   Axis I: GAD   Axis II: deferred   Axis III: Hpylori   Axis IV: unemployed   Axis V (current): 75  Axis V (highest in past year): 75         PLAN: No psychiatric meds for now. Pt to f/u as needed. Make appt with Ladora Daniel or [facility] re IUd eval.      Patient agreed to call before the next appointment if there is an increase in symptoms that cannot wait until the appointment. Patient agreed to call or go to the Psychiatric Emergency Service [phone]), or to his/her nearest Emergency Room, for urgent assistance during evenings, nights, weekends and holidays or at anytime if feeling at risk of imminent harm.      Risk plan (for patients at moderate/high risk for suicide/violence/addiction): Patient not at moderate or high risk.      FOR PSYCHOPHARMACOLOGY VISITS ONLY   Pregnancy status: pregnant     Medication plan: See plan      Medication education: n/a     Medical work-up plan/testing: None indicated.      Instructions to covering prescriber: n/a     Amount of time spent w/patient today: 30 min      Garielle Mroz Ellard Artis, RNCS

## 2006-06-25 ENCOUNTER — Encounter (HOSPITAL_BASED_OUTPATIENT_CLINIC_OR_DEPARTMENT_OTHER): Payer: No Typology Code available for payment source

## 2006-09-08 ENCOUNTER — Ambulatory Visit (HOSPITAL_BASED_OUTPATIENT_CLINIC_OR_DEPARTMENT_OTHER): Payer: No Typology Code available for payment source | Admitting: Gastroenterology

## 2006-09-11 ENCOUNTER — Ambulatory Visit (HOSPITAL_BASED_OUTPATIENT_CLINIC_OR_DEPARTMENT_OTHER): Payer: No Typology Code available for payment source

## 2006-09-11 ENCOUNTER — Encounter (HOSPITAL_BASED_OUTPATIENT_CLINIC_OR_DEPARTMENT_OTHER): Payer: Self-pay

## 2006-09-11 VITALS — BP 120/78 | HR 72 | Wt 216.0 lb

## 2006-09-11 DIAGNOSIS — Z309 Encounter for contraceptive management, unspecified: Secondary | ICD-10-CM

## 2006-09-11 DIAGNOSIS — Z833 Family history of diabetes mellitus: Secondary | ICD-10-CM

## 2006-09-11 DIAGNOSIS — E7800 Pure hypercholesterolemia, unspecified: Secondary | ICD-10-CM

## 2006-09-11 DIAGNOSIS — D649 Anemia, unspecified: Secondary | ICD-10-CM

## 2006-09-11 DIAGNOSIS — H8110 Benign paroxysmal vertigo, unspecified ear: Secondary | ICD-10-CM

## 2006-09-11 LAB — URINE DIP (POINT OF CARE)
BILIRUBIN, URINE: NEGATIVE mg/dl (ref 0–0)
GLUCOSE, URINE: NEGATIVE mg/dl (ref 0–0)
KETONE, URINE: NEGATIVE mg/dl (ref 0–0)
NITRITE, URINE: NEGATIVE
OCCULT BLOOD, URINE: NEGATIVE mg/dl (ref 0–0)
PH URINE: 7 (ref 5.0–8.0)
PROTEIN, URINE: NEGATIVE mg/dl (ref 0–15)
SPECIFIC GRAVITY URINE: 1.015 (ref 1.003–1.030)
UROBILINOGEN URINE: 0.2 mg/dl (ref 0.2–1.0)

## 2006-09-11 LAB — URINE PREGNANCY TEST (POINT OF CARE): HCG QUALITATIVE URINE: NEGATIVE

## 2006-09-11 LAB — CHG LIPID PANEL
Cholesterol: 242 mg/dl — ABNORMAL HIGH (ref 0–200)
HIGH DENSITY LIPOPROTEIN: 40 mg/dl (ref 35–85)
LOW DENSITY LIPOPROTEIN DIRECT: 192 mg/dl (ref 0–100)
RISK FACTOR: 6.1 — ABNORMAL HIGH (ref ?–4.4)
TRIGLYCERIDES: 88 mg/dl (ref 0–150)

## 2006-09-11 LAB — HEMOGLOBIN: HEMOGLOBIN: 12.7 g/dl (ref 12.0–16.0)

## 2006-09-11 LAB — GLUCOSE FASTING: GLUCOSE FASTING: 86 mg/dl (ref 74–118)

## 2006-09-11 MED ORDER — MECLIZINE HCL 25 MG PO TABS
ORAL_TABLET | ORAL | Status: DC
Start: 2006-09-11 — End: 2012-09-21

## 2006-09-11 MED ORDER — MECLIZINE HCL 25 MG PO TABS
ORAL_TABLET | ORAL | Status: AC
Start: 2006-09-11 — End: 2006-09-18

## 2006-09-11 NOTE — Progress Notes (Signed)
SUBJECTIVE:  Alejandra Martin is a 36 year old female c/o nausea and dizziness x 3 days, started when she stood up in morning, triggered by forward bending and position change  Seasonal allergies, takes claritin  No cough or sore throat or nasal congestion  Drinks 2 bottles aquafina daily, drinks iced coffee twice daily med [employer]  States last time she felt this way she had UTI (had no urinary symptoms)  Currently has urinary frequency, no dysuria  Not breastfeeding, 6 months pp, using withdrawal    Current outpatient prescriptions prior to encounter:  NATACHEW OR CHEW, two PO QD, Disp: 200, Rfl: 3      OBJECTIVE:  PERRLA. Vertical nystagmus. Ears normal. Throat and pharynx normal. Neck supple. No adenopathy or masses in the neck or supraclavicular regions. Sinuses non tender. The chest is clear, without wheezes, rhonci or rales.      386.11B Benign Positional Vertigo (primary encounter diagnosis)  Comment:   Plan: URINE DIP (POINT OF CARE), MECLIZINE HCL 25 MG    OR TABS, MECLIZINE HCL 25 MG OR TABS   Rx per orders, hydration    Recheck as needed for persistence, worsening, appearance of new   Symptoms.    V25.9B Contraceptive Management  Comment:   Plan: URINE PREGNANCY TEST (POINT OF CARE)  Interested in [place], she plans to consider and schedule with [facility] for consent.    285.9Y Anemia  Comment:   Plan: HEMOGLOBIN AUTO, ROUTINE VENIPUNCTURE       272.0 PURE HYPERCHOLESTEROLEM  Comment:   Plan: LIPID PANEL       V18.0 Family History of Diabetes Mellitus  Comment: pt request  Plan: GLUCOSE FASTING

## 2006-09-14 ENCOUNTER — Encounter (HOSPITAL_BASED_OUTPATIENT_CLINIC_OR_DEPARTMENT_OTHER): Payer: Self-pay

## 2006-09-24 ENCOUNTER — Ambulatory Visit (HOSPITAL_BASED_OUTPATIENT_CLINIC_OR_DEPARTMENT_OTHER): Payer: No Typology Code available for payment source | Admitting: Gastroenterology

## 2006-09-24 LAB — BLOOD COUNT COMPLETE AUTO&AUTO DIFRNTL WBC
BASOPHIL %: 1 % (ref 0.0–2.0)
EOSINOPHIL %: 0.6 % (ref 0.0–7.0)
HEMATOCRIT: 40.4 % (ref 36.0–48.0)
HEMOGLOBIN: 13.6 g/dl (ref 12.0–16.0)
LYMPHOCYTE %: 23.8 % (ref 13.0–39.0)
MEAN CORP HGB CONC: 33.7 g/dl (ref 32.0–36.0)
MEAN CORPUSCULAR HGB: 31.2 pg (ref 27.0–33.0)
MEAN CORPUSCULAR VOL: 92.5 fl (ref 80.0–100.0)
MEAN PLATELET VOLUME: 8 fl (ref 6.4–10.8)
MONOCYTE %: 2.7 % (ref 1.0–12.0)
NEUTROPHIL %: 71.9 % (ref 46.0–79.0)
PLATELET COUNT: 249 10*3/uL (ref 150–400)
RBC DISTRIBUTION WIDTH: 12.4 % (ref 11.5–14.3)
RED BLOOD CELL COUNT: 4.37 M/uL — ABNORMAL LOW (ref 4.50–5.10)
WHITE BLOOD CELL COUNT: 10.3 10*3/uL (ref 4.0–10.8)

## 2006-09-24 LAB — PROTHROMBIN TIME
INR: 1 — ABNORMAL LOW (ref 2.0–3.5)
PROTHROMBIN TIME: 10.3 SECONDS (ref 9.5–11.5)

## 2006-09-28 ENCOUNTER — Ambulatory Visit (HOSPITAL_BASED_OUTPATIENT_CLINIC_OR_DEPARTMENT_OTHER): Payer: Self-pay | Admitting: Gastroenterology

## 2006-09-29 LAB — HELICOBACTER PYLORI STOOL AG

## 2006-10-23 ENCOUNTER — Ambulatory Visit (HOSPITAL_BASED_OUTPATIENT_CLINIC_OR_DEPARTMENT_OTHER): Payer: Self-pay | Admitting: Gastroenterology

## 2006-10-23 LAB — GI CENTER CLINIC NOTE

## 2006-10-23 LAB — GI OPERATIVE NOTE

## 2006-10-23 LAB — HCG QUALITATIVE URINE: HCG QUALITATIVE URINE: NEGATIVE

## 2006-10-27 LAB — SURGICAL PATH SPECIMEN

## 2006-10-28 ENCOUNTER — Encounter (HOSPITAL_BASED_OUTPATIENT_CLINIC_OR_DEPARTMENT_OTHER): Payer: Self-pay | Admitting: Internal Medicine

## 2006-10-28 DIAGNOSIS — D126 Benign neoplasm of colon, unspecified: Secondary | ICD-10-CM | POA: Insufficient documentation

## 2006-12-09 ENCOUNTER — Ambulatory Visit (HOSPITAL_BASED_OUTPATIENT_CLINIC_OR_DEPARTMENT_OTHER): Payer: Self-pay | Admitting: Gastroenterology

## 2007-01-07 LAB — GI CENTER CLINIC NOTE

## 2007-02-26 ENCOUNTER — Other Ambulatory Visit (HOSPITAL_BASED_OUTPATIENT_CLINIC_OR_DEPARTMENT_OTHER): Payer: Self-pay | Admitting: Registered Nurse

## 2007-02-26 DIAGNOSIS — Z309 Encounter for contraceptive management, unspecified: Secondary | ICD-10-CM

## 2007-02-26 NOTE — Progress Notes (Signed)
Pt requesting pregnancy test.

## 2007-04-16 ENCOUNTER — Encounter (HOSPITAL_BASED_OUTPATIENT_CLINIC_OR_DEPARTMENT_OTHER): Payer: Self-pay

## 2007-04-16 NOTE — Progress Notes (Signed)
PSYCHIATRY TERMINATION AND TRANSFER NOTE    Termination Document    Date treatment started: 09/02/04    Transfer/termination date: 06/15/06    Reason for treatment: Pt referred to [facility] by PCP for mgt of anxiety/mood sx    Treatment course (response to medications, compliance): Psychopharm intervention per EPIC. Pt got pregnant and d/c medications. Saw pt in postpartum phase and no evidence of postpartum depression or excessive anxiety. Pt not currently on meds.     Outstanding Issues: Pt to follow up with me or PCP if sx re-occur or level of functioning decreases.    Safe to refill: n/a    Risk level: low    Plan: F/U with PCP or CL as needed    For transfers, the treatment plan will now be the responsibility of: not applicable    (Clinician, please route/cc this encounter to your team/program administrative coordinator so that the treatment plan database can be updated.)

## 2007-12-25 ENCOUNTER — Emergency Department (HOSPITAL_BASED_OUTPATIENT_CLINIC_OR_DEPARTMENT_OTHER)
Admission: RE | Admit: 2007-12-25 | Disposition: A | Discharge status: Home / Self Care | Payer: Self-pay | Source: Emergency Department | Attending: Physician Assistant | Admitting: Physician Assistant

## 2007-12-25 ENCOUNTER — Encounter (HOSPITAL_BASED_OUTPATIENT_CLINIC_OR_DEPARTMENT_OTHER): Payer: Self-pay

## 2007-12-25 LAB — XR CHEST 2 VIEWS

## 2007-12-25 LAB — EMERGENCY ROOM NOTE

## 2007-12-25 MED ORDER — AUGMENTIN 875-125 MG PO TABS
ORAL_TABLET | ORAL | Status: AC
Start: 2007-12-25 — End: 2008-01-04

## 2007-12-25 NOTE — ED Notes (Signed)
Pt c/o productive cough x1 week.  

## 2008-04-28 ENCOUNTER — Encounter (HOSPITAL_BASED_OUTPATIENT_CLINIC_OR_DEPARTMENT_OTHER): Payer: Self-pay

## 2008-04-28 ENCOUNTER — Emergency Department (HOSPITAL_BASED_OUTPATIENT_CLINIC_OR_DEPARTMENT_OTHER)
Admission: RE | Admit: 2008-04-28 | Discharge status: Against Medical Advice | Payer: Self-pay | Source: Emergency Department | Attending: Emergency Medicine | Admitting: Emergency Medicine

## 2008-04-28 HISTORY — DX: Gastro-esophageal reflux disease without esophagitis: K21.9

## 2008-04-28 LAB — BLOOD COUNT COMPLETE AUTO&AUTO DIFRNTL WBC
BASOPHIL %: 0.5 % (ref 0.0–2.0)
EOSINOPHIL %: 0.6 % (ref 0.0–7.0)
HEMATOCRIT: 37.5 % (ref 36.0–48.0)
HEMOGLOBIN: 13 g/dl (ref 12.0–16.0)
LYMPHOCYTE %: 25.2 % (ref 13.0–39.0)
MEAN CORP HGB CONC: 34.7 g/dl (ref 32.0–36.0)
MEAN CORPUSCULAR HGB: 31.2 pg (ref 27.0–33.0)
MEAN CORPUSCULAR VOL: 90.1 fl (ref 80.0–100.0)
MEAN PLATELET VOLUME: 8.2 fl (ref 6.4–10.8)
MONOCYTE %: 3.4 % (ref 1.0–12.0)
NEUTROPHIL %: 70.3 % (ref 46.0–79.0)
PLATELET COUNT: 216 10*3/uL (ref 150–400)
RBC DISTRIBUTION WIDTH: 13.5 % (ref 11.5–14.3)
RED BLOOD CELL COUNT: 4.17 M/uL — ABNORMAL LOW (ref 4.50–5.10)
WHITE BLOOD CELL COUNT: 8.2 10*3/uL (ref 4.0–10.8)

## 2008-04-28 LAB — COMPREHENSIVE METABOLIC PANEL
ALANINE AMINOTRANSFERASE: 12 IU/L (ref 7–35)
ALBUMIN: 4.2 g/dl (ref 3.4–4.8)
ALKALINE PHOSPHATASE: 84 IU/L (ref 25–106)
ANION GAP: 9 mmol/L (ref 2–25)
ASPARTATE AMINOTRANSFERASE: 15 IU/L (ref 8–34)
BILIRUBIN TOTAL: 0.7 mg/dl (ref 0.2–1.1)
BUN (UREA NITROGEN): 15 mg/dl (ref 6–20)
CALCIUM: 9.3 mg/dl (ref 8.6–10.3)
CARBON DIOXIDE: 24 mmol/L (ref 22–32)
CHLORIDE: 104 mmol/L (ref 101–111)
CREATININE: 0.6 mg/dl (ref 0.4–1.2)
ESTIMATED GLOMERULAR FILT RATE: >60 mL/min (ref >60)
Glucose Random: 87 mg/dl (ref 74–160)
POTASSIUM: 3.7 mmol/L (ref 3.5–5.1)
SODIUM: 137 mmol/L (ref 135–144)
TOTAL PROTEIN: 7.1 g/dl (ref 5.9–7.5)

## 2008-04-28 LAB — APTT: APTT: 27.5 SECONDS (ref 23.7–33.3)

## 2008-04-28 LAB — HCG QUALITATIVE SERUM: HCG QUALITATIVE SERUM: NEGATIVE

## 2008-04-28 LAB — AMYLASE: AMYLASE: 64 U/L (ref 15–133)

## 2008-04-28 LAB — PROTHROMBIN TIME
INR: 1 — ABNORMAL LOW (ref 2.0–3.5)
PROTHROMBIN TIME: 10 SECONDS (ref 9.5–11.5)

## 2008-04-28 LAB — LIPASE: LIPASE: 22 U/L (ref 10–50)

## 2008-04-28 LAB — CK-MB PROFILE
CK INDEX: 1.3 %
CKMB: 1.7 ng/mL (ref 0.0–4.0)
CREATINE KINASE TOTAL: 136 IU/L (ref 21–215)

## 2008-04-28 LAB — XR CHEST PORTABLE

## 2008-04-28 LAB — TROPONIN I: TROPONIN I: <0.01 ng/mL (ref 0.00–0.04)

## 2008-04-28 NOTE — ED Notes (Signed)
PT HAVING SOB WITH EXERTION AND CHEST PAIN FOR 4 DAYS.

## 2008-04-30 LAB — EKG

## 2008-05-06 LAB — EMERGENCY ROOM NOTE

## 2008-05-09 ENCOUNTER — Ambulatory Visit: Payer: Self-pay

## 2008-05-12 LAB — CARDIAC STRESS TEST: EXERCISE/TREADMILL

## 2008-09-11 ENCOUNTER — Ambulatory Visit (HOSPITAL_BASED_OUTPATIENT_CLINIC_OR_DEPARTMENT_OTHER): Payer: PRIVATE HEALTH INSURANCE | Admitting: Gastroenterology

## 2008-09-14 ENCOUNTER — Ambulatory Visit (HOSPITAL_BASED_OUTPATIENT_CLINIC_OR_DEPARTMENT_OTHER): Payer: PRIVATE HEALTH INSURANCE | Admitting: Gastroenterology

## 2008-09-14 VITALS — BP 102/71 | HR 88 | Temp 98.7°F | Resp 16 | Wt 210.0 lb

## 2008-09-14 DIAGNOSIS — K219 Gastro-esophageal reflux disease without esophagitis: Secondary | ICD-10-CM

## 2008-09-14 DIAGNOSIS — D369 Benign neoplasm, unspecified site: Secondary | ICD-10-CM

## 2008-09-14 DIAGNOSIS — E669 Obesity, unspecified: Secondary | ICD-10-CM

## 2008-09-14 LAB — BLOOD COUNT COMPLETE AUTOMATED
HEMATOCRIT: 38.1 % (ref 36.0–48.0)
HEMOGLOBIN: 13.1 g/dl (ref 12.0–16.0)
MEAN CORP HGB CONC: 34.4 g/dl (ref 32.0–36.0)
MEAN CORPUSCULAR HGB: 31.7 pg (ref 27.0–33.0)
MEAN CORPUSCULAR VOL: 92.3 fl (ref 80.0–100.0)
MEAN PLATELET VOLUME: 7.4 fl (ref 6.4–10.8)
PLATELET COUNT: 246 10*3/uL (ref 150–400)
RBC DISTRIBUTION WIDTH: 12.3 % (ref 11.5–14.3)
RED BLOOD CELL COUNT: 4.12 M/uL — ABNORMAL LOW (ref 4.50–5.10)
WHITE BLOOD CELL COUNT: 9 10*3/uL (ref 4.0–10.8)

## 2008-09-14 LAB — COMPREHENSIVE METABOLIC PANEL
ALANINE AMINOTRANSFERASE: 13 IU/L (ref 7–35)
ALBUMIN: 4.2 g/dl (ref 3.4–4.8)
ALKALINE PHOSPHATASE: 81 IU/L (ref 25–106)
ANION GAP: 7 mmol/L (ref 2–25)
ASPARTATE AMINOTRANSFERASE: 19 IU/L (ref 8–34)
BILIRUBIN TOTAL: 0.5 mg/dl (ref 0.2–1.1)
BUN (UREA NITROGEN): 16 mg/dl (ref 6–20)
CALCIUM: 10 mg/dl (ref 8.6–10.3)
CARBON DIOXIDE: 28 mmol/L (ref 22–32)
CHLORIDE: 103 mmol/L (ref 101–111)
CREATININE: 0.7 mg/dl (ref 0.4–1.2)
ESTIMATED GLOMERULAR FILT RATE: >60 mL/min (ref >60)
Glucose Random: 82 mg/dl (ref 74–160)
POTASSIUM: 4.2 mmol/L (ref 3.5–5.1)
SODIUM: 138 mmol/L (ref 135–144)
TOTAL PROTEIN: 7.3 g/dl (ref 5.9–7.5)

## 2008-09-14 LAB — LIPASE: LIPASE: 20 U/L (ref 10–50)

## 2008-09-14 LAB — AMYLASE: AMYLASE: 63 U/L (ref 15–133)

## 2008-09-14 MED ORDER — PRILOSEC 20 MG PO CPDR
DELAYED_RELEASE_CAPSULE | ORAL | Status: AC
Start: 2008-09-14 — End: 2009-09-14

## 2008-09-14 NOTE — Progress Notes (Signed)
.  .  .      This 38 year old English speaking patient presents for follow up of her history of colon polyps    Please see the dictated history.    Past Medical History  Patient Active Problem List:     ALLERGY, UNSPECIFIED [995.3]     ACUTE GASTRITIS W/O HEMORRHAGE [535.00]     CLOSTRIDIUM DIFFICILE [008.45]     PURE HYPERCHOLESTEROLEM [272.0]     GENERALIZED ANXIETY DIS [300.02]     Benign Positional Vertigo [386.11B]     Contraceptive Management [V25.9B]     Tubular Adenoma [229.9DZ]      Past Surgical History    Past Surgical History    NO SIGNIFICANT SURGICAL HISTORY          Review of Patient's Allergies indicates:   Erythromycin            Itching    Social Hx    Tobacco Use: Never           Alcohol Use: No                  Current outpatient prescriptions:  PRILOSEC 20 MG OR CPDR 1 CAPSULE DAILY Disp: 30 Rfl: 12   PROTONIX OR None Entered Disp:  Rfl:    MECLIZINE HCL 25 MG OR TABS 1 TABLET 3 TIMES DAILY AS NEEDED Disp: 60 Rfl: 0         Family Hx    Family History    Diabetes Mother    Hypertension Mother    Lipids Mother    Hypertension Father       No family history of colon cancer, IBD.    REVIEW OF SYSTEMS:    Cardiovascular:  No chest pain, palpitations, MI or heart failure.  Pulmonary:  No pneumonia, TB or asthma.    Neuro:  No stroke, seizure or loss of consciousness.    Endocrine:  No diabetes or thyroid disease.      Physical Exam:  Vital Signs: BP 102/71   Pulse 88   Temp 98.7 F (37.1 C)   Resp 16   Wt 210 lb (95.255 kg)   SpO2 98%  General: Obese body habitus.   HEENT: Normocephalic, atrumatic, PERRLA, Extra occular motions intact. No scleral icterus. Pharynx benign. Tongue midline.  LN: Without cervical, superclavicular or infraclavicular lymphadenopathy.  Pulmonary: Clear to auscultation and percussion. No rales, wheezes or ronchi.  Cardiovascular: S1, S2, no S3, S4, clicks, rubs or murmurs.  JVD not elevated with patient sitting.  Abdominal: Normal bowel sounds. No tenderness on light or deep  palpation. No hepatomegally or spleenomegally.  Neurological: No asterixis. Sensation intact. Normal motor function. Normal gait.     Pertinent Labs:  see dictation    Impression:  229.9 Tubular adenoma  278.00 Obesity  530.81 GERD    Medical Decision Making:  Please see dictation.    Reviewed with patient.

## 2008-09-27 ENCOUNTER — Ambulatory Visit (HOSPITAL_BASED_OUTPATIENT_CLINIC_OR_DEPARTMENT_OTHER): Payer: Self-pay | Admitting: Gastroenterology

## 2008-10-06 ENCOUNTER — Ambulatory Visit (HOSPITAL_BASED_OUTPATIENT_CLINIC_OR_DEPARTMENT_OTHER): Payer: Self-pay | Admitting: Gastroenterology

## 2008-10-06 LAB — HCG QUALITATIVE URINE: HCG QUALITATIVE URINE: NEGATIVE

## 2008-10-06 LAB — GI OPERATIVE NOTE

## 2008-10-11 LAB — SURGICAL PATH SPECIMEN

## 2008-10-17 LAB — GI CENTER CLINIC NOTE

## 2008-11-28 ENCOUNTER — Ambulatory Visit (HOSPITAL_BASED_OUTPATIENT_CLINIC_OR_DEPARTMENT_OTHER): Payer: PRIVATE HEALTH INSURANCE | Admitting: Gastroenterology

## 2008-11-28 VITALS — BP 123/82 | HR 81 | Temp 97.8°F | Resp 16 | Wt 216.0 lb

## 2008-11-28 DIAGNOSIS — Z860100 Personal history of colon polyps, unspecified: Secondary | ICD-10-CM

## 2008-11-28 DIAGNOSIS — E669 Obesity, unspecified: Secondary | ICD-10-CM

## 2008-11-28 DIAGNOSIS — Z800 Family history of malignant neoplasm of digestive organs: Secondary | ICD-10-CM

## 2008-11-28 DIAGNOSIS — K219 Gastro-esophageal reflux disease without esophagitis: Secondary | ICD-10-CM

## 2008-11-28 DIAGNOSIS — F411 Generalized anxiety disorder: Secondary | ICD-10-CM

## 2008-11-28 DIAGNOSIS — E7800 Pure hypercholesterolemia, unspecified: Secondary | ICD-10-CM

## 2008-11-28 NOTE — Progress Notes (Signed)
This 38 year old English speaking patient presents for follow up of her history of GERD and abdominal discomfort.    Please see the dictated history.    Past Medical History  Patient Active Problem List:     ALLERGY, UNSPECIFIED [995.3]     ACUTE GASTRITIS W/O HEMORRHAGE [535.00]     CLOSTRIDIUM DIFFICILE [008.45]     PURE HYPERCHOLESTEROLEM [272.0]     GENERALIZED ANXIETY DIS [300.02]     Benign Positional Vertigo [386.11B]     Contraceptive Management [V25.9B]     Tubular Adenoma [229.9DZ]     [facility] [55558]     GERD (gastroesophageal reflux disease) [530.81K]      Review of Patient's Allergies indicates:   Erythromycin            Itching    Social Hx    Tobacco Use: Never           Alcohol Use: No              Current outpatient prescriptions:  PRILOSEC 20 MG OR CPDR 1 CAPSULE DAILY Disp: 30 Rfl: 12   PROTONIX OR None Entered Disp:  Rfl:    MECLIZINE HCL 25 MG OR TABS 1 TABLET 3 TIMES DAILY AS NEEDED Disp: 60 Rfl: 0       Family Hx    Family History    Diabetes Mother    Hypertension Mother    Lipids Mother    Hypertension Father       REVIEW OF SYSTEMS:    Cardiovascular:  No chest pain, palpitations, MI or heart failure.  Pulmonary:  No pneumonia, TB or asthma.    Neuro:  No stroke, seizure or loss of consciousness.  Had Benign Positional Vertigo.  Endocrine:  No diabetes or thyroid disease.      Physical Exam:  Vital Signs: BP 123/82   Pulse 81   Temp 97.8 F (36.6 C)   Resp 16   Wt 216 lb (97.977 kg)   SpO2 98%  General: Obese body habitus.   Pulmonary: Clear to auscultation and percussion. No rales, wheezes or rhonchi.  Cardiovascular: S1, S2, no S3, S4, clicks, rubs or murmurs.  JVD not elevated with patient sitting.  Abdominal: Normal bowel sounds. No tenderness on light or deep palpation. No hepatomegaly or splenomegaly.  Neurological: Sensation intact. Normal motor function. Normal gait.     Impression:  530.81K GERD (gastroesophageal reflux disease)  (primary  encounter diagnosis)  V12.72B History of colonic polyps  V16.0S Family history of esophageal cancer  V16.0AC Family history of pancreatic cancer  300.02 GENERALIZED ANXIETY DIS  272.0 PURE HYPERCHOLESTEROLEMIA  278.0 Obesity    Medical Decision Making:  Please see dictation.    Reviewed with patient.

## 2009-01-10 LAB — GI CENTER CLINIC NOTE

## 2009-01-12 ENCOUNTER — Ambulatory Visit (HOSPITAL_BASED_OUTPATIENT_CLINIC_OR_DEPARTMENT_OTHER): Payer: Self-pay | Admitting: Genetics

## 2009-02-02 ENCOUNTER — Ambulatory Visit (HOSPITAL_BASED_OUTPATIENT_CLINIC_OR_DEPARTMENT_OTHER): Payer: Self-pay | Admitting: Genetics

## 2009-02-07 ENCOUNTER — Telehealth (HOSPITAL_BASED_OUTPATIENT_CLINIC_OR_DEPARTMENT_OTHER): Payer: Self-pay

## 2009-02-07 NOTE — Telephone Encounter (Signed)
Alejandra Martin is calling today to say that she is having pain in her abdomen like before.  She said she tried taking double the Protonix but that did not help.  She suggested that she needed blood tests for H-Pylori again because it is the " same type of pain that she had before".  Her number is [phone].

## 2009-02-23 ENCOUNTER — Ambulatory Visit (HOSPITAL_BASED_OUTPATIENT_CLINIC_OR_DEPARTMENT_OTHER): Payer: Self-pay | Admitting: Genetics

## 2009-03-16 ENCOUNTER — Ambulatory Visit (HOSPITAL_BASED_OUTPATIENT_CLINIC_OR_DEPARTMENT_OTHER): Payer: Self-pay | Admitting: Genetics

## 2009-04-11 ENCOUNTER — Ambulatory Visit (HOSPITAL_BASED_OUTPATIENT_CLINIC_OR_DEPARTMENT_OTHER): Payer: PRIVATE HEALTH INSURANCE | Admitting: Gastroenterology

## 2009-04-11 VITALS — BP 116/77 | HR 67 | Temp 97.9°F | Resp 16 | Ht 63.0 in | Wt 235.0 lb

## 2009-04-11 DIAGNOSIS — R197 Diarrhea, unspecified: Secondary | ICD-10-CM

## 2009-04-11 DIAGNOSIS — K219 Gastro-esophageal reflux disease without esophagitis: Secondary | ICD-10-CM

## 2009-04-11 LAB — CBC, PLATELET & DIFFERENTIAL
BASOPHIL %: 0.5 % (ref 0.0–2.0)
EOSINOPHIL %: 2.8 % (ref 0.0–7.0)
HEMATOCRIT: 38.8 % (ref 36.0–48.0)
HEMOGLOBIN: 13.2 g/dl (ref 12.0–16.0)
LYMPHOCYTE %: 28.7 % (ref 13.0–39.0)
MEAN CORP HGB CONC: 34.1 g/dl (ref 32.0–36.0)
MEAN CORPUSCULAR HGB: 31.1 pg (ref 27.0–33.0)
MEAN CORPUSCULAR VOL: 91 fl (ref 80.0–100.0)
MEAN PLATELET VOLUME: 8.5 fl (ref 6.4–10.8)
MONOCYTE %: 6.3 % (ref 1.0–12.0)
NEUTROPHIL %: 61.7 % (ref 46.0–79.0)
PLATELET COUNT: 255 10*3/uL (ref 150–400)
RBC DISTRIBUTION WIDTH: 12.3 % (ref 11.5–14.3)
RED BLOOD CELL COUNT: 4.26 M/uL — ABNORMAL LOW (ref 4.50–5.10)
WHITE BLOOD CELL COUNT: 7.9 10*3/uL (ref 4.0–10.8)

## 2009-04-11 LAB — COMPREHENSIVE METABOLIC PANEL
ALANINE AMINOTRANSFERASE: 19 IU/L (ref 7–35)
ALBUMIN: 3.9 g/dl (ref 3.4–4.8)
ALKALINE PHOSPHATASE: 97 IU/L (ref 25–106)
ANION GAP: 11 mmol/L (ref 2–25)
ASPARTATE AMINOTRANSFERASE: 19 IU/L (ref 8–34)
BILIRUBIN TOTAL: 0.4 mg/dl (ref 0.2–1.1)
BUN (UREA NITROGEN): 10 mg/dl (ref 6–20)
CALCIUM: 9 mg/dl (ref 8.6–10.3)
CARBON DIOXIDE: 23 mmol/L (ref 22–32)
CHLORIDE: 103 mmol/L (ref 101–111)
CREATININE: 0.6 mg/dl (ref 0.4–1.2)
ESTIMATED GLOMERULAR FILT RATE: >60 mL/min (ref >60)
Glucose Random: 71 mg/dl — ABNORMAL LOW (ref 74–160)
POTASSIUM: 4.2 mmol/L (ref 3.5–5.1)
SODIUM: 137 mmol/L (ref 135–144)
TOTAL PROTEIN: 6.9 g/dl (ref 5.9–7.5)

## 2009-04-11 NOTE — Progress Notes (Signed)
This 39 year old English speaking patient presents for follow up of her history of GERD. She presents with a new complaint of acute diarrhea.    Please see the dictated history.    Past Medical History  Patient Active Problem List:     ALLERGY, UNSPECIFIED [995.3]     ACUTE GASTRITIS W/O HEMORRHAGE [535.00]     CLOSTRIDIUM DIFFICILE [008.45]     PURE HYPERCHOLESTEROLEM [272.0]     GENERALIZED ANXIETY DIS [300.02]     Benign Positional Vertigo [386.11B]     Contraceptive Management [V25.9B]     Tubular Adenoma [229.9DZ]     GERD (gastroesophageal reflux disease) [530.81K]     [facility] - [facility] [57846]        Review of Patient's Allergies indicates:   Erythromycin            Itching    Social Hx    Tobacco Use: Never           Alcohol Use: No                  Current outpatient prescriptions:  cetirizine (ZYRTEC) 10 MG tablet Take 10 mg by mouth daily. Disp:  Rfl:    PRILOSEC 20 MG OR CPDR 1 CAPSULE DAILY Disp: 30 Rfl: 12   PROTONIX OR None Entered Disp:  Rfl:    MECLIZINE HCL 25 MG OR TABS 1 TABLET 3 TIMES DAILY AS NEEDED Disp: 60 Rfl: 0         Family Hx    Family History    Diabetes Mother    Hypertension Mother    Lipids Mother    Hypertension Father           REVIEW OF SYSTEMS:    Cardiovascular:  No chest pain, palpitations, MI or heart failure.  Pulmonary:  No pneumonia, TB or asthma.    Neuro:  No stroke, seizure or loss of consciousness.    Endocrine:  No diabetes or thyroid disease.      Physical Exam:  Vital Signs: BP 116/77   Pulse 67   Temp 97.9 F (36.6 C)   Resp 16   Ht 5\' 3"  (1.6 m)   Wt 235 lb (106.595 kg)   SpO2 97% Body mass index is 41.63 kg/(m^2).  General: Obese body habitus.   Pulmonary: Clear to auscultation and percussion. No rales, wheezes or rhonchi.  Cardiovascular: S1, S2, no S3, S4, clicks, rubs or murmurs.  JVD not elevated with patient sitting.  Abdominal: Normal bowel sounds. No tenderness on light or deep palpation. No hepatomegaly or  splenomegaly.  Neurological: No asterixis. Sensation intact. Normal motor function. Normal gait.       Impression:  787.91 Diarrhea  (primary encounter diagnosis)  530.81K GERD (gastroesophageal reflux disease)    Medical Decision Making:  Please see dictation.    Reviewed with patient.

## 2009-04-19 LAB — OVA AND PARASITES
OVA AND PARASITES: NONE SEEN
OVA AND PARASITES: NONE SEEN
OVA AND PARASITES: NONE SEEN

## 2009-04-19 LAB — CLOSTRIDIUM DIFF TOXIN A

## 2009-04-19 LAB — GIARDIA ANTIGEN: GIARDIA ANTIGEN: NEGATIVE

## 2009-04-21 LAB — GASTROINTESTINAL CULTURE
SHIGA TOXIN 1: NOT DETECTED
SHIGA TOXIN 2: NOT DETECTED

## 2009-04-21 LAB — FECAL LEUKOCYTE SCREEN: PROCEDURE PROMPT: NONE SEEN

## 2009-04-23 NOTE — Progress Notes (Addendum)
Quick Note:    The patient wanted a copy of her stool results. This was sent.  ______

## 2009-05-22 LAB — GI CENTER CLINIC NOTE

## 2009-10-08 ENCOUNTER — Emergency Department (HOSPITAL_BASED_OUTPATIENT_CLINIC_OR_DEPARTMENT_OTHER)
Admission: RE | Admit: 2009-10-08 | Disposition: A | Discharge status: Home / Self Care | Payer: Self-pay | Source: Emergency Department | Attending: Physician Assistant | Admitting: Physician Assistant

## 2009-10-08 ENCOUNTER — Encounter (HOSPITAL_BASED_OUTPATIENT_CLINIC_OR_DEPARTMENT_OTHER): Payer: Self-pay

## 2009-10-08 LAB — XR ANKLE RIGHT MINIMUM 3 VIEWS

## 2009-10-08 MED ORDER — IBUPROFEN 800 MG PO TABS
800.0000 mg | ORAL_TABLET | Freq: Three times a day (TID) | ORAL | Status: DC | PRN
Start: 2009-10-08 — End: 2012-09-21

## 2009-10-08 NOTE — ED Notes (Signed)
Pt to ED with c/o R foot pain top of foot to back of heel x 5 days, pt denies injury.

## 2009-10-08 NOTE — Discharge Instructions (Signed)
You have a sprained ankle. Keep the ankle elevated and use ice for swelling and pain. You can take over the counter pain medications such as Tylenol or Advil for pain. Keep aircast on until you are able to bear weight without any pain. Follow up with your PCP and return to the ED for worsening pain, swelling or any other concerning symptoms.     Ankle Sprain     An ankle sprain is an injury to the ligaments that hold the ankle joint together.         CAUSE  The injury is usually caused by a fall or by twisting the ankle. It is important to tell your caregiver how the injury occurred and whether or not you were able to walk immediately after the injury.      SYMPTOMS  Pain is the primary symptom. It may be present at rest or only when you are trying to stand or walk. The ankle will likely be swollen. Bruising may develop immediately or after 1 or 2 days. It may be difficult or impossible to stand or walk. This depends on the severity of the sprain.     DIAGNOSIS  Your caregiver can determine if a sprain has occurred based on the accident details and on examination of your ankle. Examination will include pressing and squeezing areas of the foot and ankle. Your caregiver will try to move the ankle in certain ways. X-rays may be used to be sure a bone was not broken, or that the ligament did not pull off of a bone (avulsion). There are standard guidelines that can reliably determine if an x-ray is needed.     RISKS AND COMPLICATIONS  A person who has sprained their ankle will be more prone to a repeat sprain. Long term pain with standing or walking or difficulty walking (chronic instability of the ankle) may result from an ankle sprain.     TREATMENT  Rest, ice, elevation, and compression are the basic modes of treatment (see home care instructions, below). Certain types of braces can help stabilize the ankle and allow early return to walking. Your caregiver can make a recommendation for this. Medication may be  recommended for pain. You may be referred to an orthopedist or a physical therapist for certain types of severe sprains.     HOME CARE INSTRUCTIONS  Ø Apply ice to the sore area for 15 to 20 minutes four times per day. Do this while you are awake for the first 2 days, or as directed. This can be stopped when the swelling goes away. Put the ice in a plastic bag and place a towel between the bag of ice and your skin.  Ø Keep your leg elevated when possible to lessen swelling.  Ø If your caregiver recommends crutches, use them as instructed with a non-weight bearing cast for 1 week. Then, you may walk on your ankle as the pain allows, or as instructed. Gradually, put weight on the affected ankle. Continue to use crutches or cane until you can walk without causing pain.  Ø If a plaster splint was applied, wear the splint until you are seen for a follow-up examination. Rest it on nothing harder than a pillow the first 24 hours. DO NOT put weight on it. DO NOT get it wet. You may take it off to take a shower or bath.   Ø You may have been given an elastic bandage to use with the plaster splint, or you may have   been given a elastic bandage to use alone. The elastic bandage is too tight if you have numbness, tingling, or if your foot becomes cold and blue. Adjust the bandage to make it comfortable.  Ø If an air splint was applied, you may blow more air into it or take some out to make it more comfortable. You may take it off at night and to take a shower or bath. Wiggle your toes in the splint several times per day if you are able.  Ø Only take over-the-counter or prescription medicines for pain, discomfort, or fever as directed by your caregiver.   Ø Do not drive a vehicle until your caregiver specifically tells you it is safe to do so.      SEEK MEDICAL CARE IF:  Ø You have an increase in bruising, swelling, or pain.  Ø You notice coldness of your toes.  Ø Pain relief is not achieved with medications.     SEEK IMMEDIATE  MEDICAL CARE IF: your toes are numb or blue or you have severe pain.     MAKE SURE YOU:   Ø Understand these instructions.   Ø Will watch your condition.  Ø Will get help right away if you are not doing well or get worse.     Document Released: 02/23/2007  Document Re-Released: 05/02/2008  [employer]® Patient Information ©2010 [employer].

## 2009-10-09 LAB — EMERGENCY ROOM NOTE

## 2009-10-10 ENCOUNTER — Ambulatory Visit (HOSPITAL_BASED_OUTPATIENT_CLINIC_OR_DEPARTMENT_OTHER): Payer: PRIVATE HEALTH INSURANCE | Admitting: Gastroenterology

## 2009-10-10 VITALS — BP 105/70 | HR 84 | Temp 98.5°F | Resp 16 | Ht 64.0 in | Wt 218.0 lb

## 2009-10-10 DIAGNOSIS — K219 Gastro-esophageal reflux disease without esophagitis: Secondary | ICD-10-CM

## 2009-10-10 MED ORDER — OMEPRAZOLE 20 MG PO CPDR
20.0000 mg | DELAYED_RELEASE_CAPSULE | Freq: Every day | ORAL | Status: DC
Start: 2009-10-10 — End: 2010-10-10

## 2009-10-10 NOTE — Progress Notes (Signed)
This 39 year old English speaking patient presents for follow up of her GERD.    Please see the dictated history.    Past Medical History  Patient Active Problem List:     ALLERGY, UNSPECIFIED [995.3]     ACUTE GASTRITIS W/O HEMORRHAGE [535.00]     CLOSTRIDIUM DIFFICILE [008.45]     PURE HYPERCHOLESTEROLEM [272.0]     GENERALIZED ANXIETY DIS [300.02]     Benign Positional Vertigo [386.11B]     Contraceptive Management [V25.9B]     Tubular Adenoma [229.9DZ]     GERD (gastroesophageal reflux disease) [530.81K]     [facility] [55571]     Diarrhea [787.91]        Past Surgical History    NO SIGNIFICANT SURGICAL HISTORY          Review of Patient's Allergies indicates:   Erythromycin            Itching    Social Hx    Smoking Status: Never Smoker                      Alcohol Use: No                  Current outpatient prescriptions:  Omeprazole (PRILOSEC PO)  Disp:  Rfl:    ibuprofen (MOTRIN) 800 MG tablet Take 1 tablet by mouth every 8 (eight) hours as needed for Pain. with food or milk Disp: 20 tablet Rfl: 0   cetirizine (ZYRTEC) 10 MG tablet Take 10 mg by mouth daily. Disp:  Rfl:    Cetirizine HCl (ZYRTEC PO)  Disp:  Rfl:    PROTONIX OR None Entered Disp:  Rfl:    MECLIZINE HCL 25 MG OR TABS 1 TABLET 3 TIMES DAILY AS NEEDED Disp: 60 Rfl: 0         Family Hx    Family History    Diabetes Mother     Hypertension Mother     Lipids Mother     Hypertension Father            REVIEW OF SYSTEMS:    Cardiovascular:  No chest pain, palpitations, MI or heart failure.  Pulmonary:  No pneumonia, TB or asthma.    Neuro:  No stroke, seizure or loss of consciousness.    Endocrine:  No diabetes or thyroid disease.      Physical Exam:  Vital Signs: BP 105/70   Pulse 84   Temp 98.5 F (36.9 C)   Resp 16   Ht 5\' 4"  (1.626 m)   Wt 218 lb (98.884 kg)   BMI 37.42 kg/m2   SpO2 99%   LMP 10/06/2009 Body mass index is 37.42 kg/(m^2).  General: Obese body habitus.   Pulmonary: Clear to auscultation and  percussion. No rales, wheezes or rhonchi.  Cardiovascular: S1, S2, no S3, S4, clicks, rubs or murmurs.  JVD not elevated with patient sitting.  Abdominal: Normal bowel sounds. No tenderness on light or deep palpation. No hepatomegaly or splenomegaly.  Neurological: Sensation intact. Normal motor function. Normal gait.       Impression:  530.81K GERD (gastroesophageal reflux disease)  (primary encounter diagnosis)    Medical Decision Making:  Please see dictation.    Reviewed with patient.

## 2009-10-13 LAB — GI CENTER CLINIC NOTE

## 2009-12-16 NOTE — Progress Notes (Signed)
No bleeding, leaking or contractions, taking UTI suppression-urine culture sent, nl GLT, increased discharge, FM stable, increased anxiety re LD-has MH f/u, discussed breathing exercises and anesthesia options, agrees to Doula-referral sent, u/s ordered for S>D   Hartley Barefoot, APRN

## 2009-12-16 NOTE — Progress Notes (Signed)
Nonspecific complaints of pregnancy.  Has difficulty determining when the baby is moving.  No change in usual fetal movements.  Put on kick counts.  Maternity consent reviewed and signed.

## 2009-12-16 NOTE — Progress Notes (Signed)
No bleeding,leaking or abd pain, CXR requisition given for +PPD/rx INH x 6 months in past, nl survey u/s, continues with amoxicillen daily for UTI suppression-urine culture sent, interested in CBE, to consider pedi options         Hartley Barefoot, APRN

## 2009-12-16 NOTE — Progress Notes (Signed)
Took Amox 500 tid x 1 wk and starting daily today for prophylaxis-urine culture sent, ERA at [facility] tomorrow, thrilled to hear FHR, traveling to [place] 7/1 x 3 wks - can call sister Valentina Gu [phone]) with urine results if a problem   Hartley Barefoot, APRN

## 2009-12-16 NOTE — Progress Notes (Signed)
Was seen on LD for cramping.  Discharged after 3 hours of observation.  Still having some craming.  Reasurred after today's SVE.  Flu shot ordered.  Explained diagnosis of macrosomia associated with shoulder dystocia. U/S ordered to be done in 4-5 weeks.  Urine culture ordered.

## 2009-12-16 NOTE — Progress Notes (Signed)
Pt requests ERA at [facility].  Being arranged.  No FH by doptone today. Currently on 10 weeks.  F/U in two week to check FH.

## 2009-12-16 NOTE — Progress Notes (Signed)
Very anxious-seeking pregnancy x 4 yrs and with sab 2 yrs ago, irregular/unknown menses with episode of spotting x 1 which has resolved,u/s dating from ED, discussed AMA and amnio option, ERA option and written info given-to discuss with husband and RTC in 2-3 wks to check FHR and decide about ERA testing, pt planning on trip to [place] 7/1-7/20, to f/u with MH clinician at [facility] for guidance with anti-depressant use-pt agrees

## 2009-12-16 NOTE — Progress Notes (Signed)
Vague discomforts of pregnancy, continues on UTI suppression, increased thirst and spilling ketones today - discussed that if today's GLT nl just needs to increase fluids, FM status quo-to do regular fetal kick counts, plans pedi-[facility], breast feeding, to see [facility] re BCM, to register for CBE, pt has check in MH appt with C.Riselli 11/07        Alejandra Barefoot, APRN

## 2009-12-16 NOTE — Progress Notes (Signed)
No complaints.  Had question ablout kick counts.  + FM.  No VB or LOF.  S=D. Taking abx for suppresion.  RTC 2 weeks   Sherral Hammers

## 2009-12-16 NOTE — Progress Notes (Signed)
C/O pruritic rash on abd and left breast.  Two macules in each area.  Told to try OTC hydrocortisone cream. Already booked for anomaly scan.  ERA neg.

## 2009-12-16 NOTE — Progress Notes (Signed)
Ocassional cramps, increased pelvic pressure, seen in LD 12/24 for decreased FM and pink discharge, no d/c now, feeling FM, cx - closed, soft, posterior, discussed kick counts, f/u u/s for macrosomia/polyhydramnios 1/3 and MD appt 1/4   Hartley Barefoot, APRN

## 2009-12-16 NOTE — Progress Notes (Signed)
No bleeding, leaking or contractions, but much more uncomfortable and difficulty sleeping, has f/u u/s for macrosomia and hydramnios on 02/19/2006, reviewed warning signs and emergency numbers reviewed, GBS sent today         Hartley Barefoot, APRN

## 2009-12-16 NOTE — Progress Notes (Signed)
Discussed increase risk of C/S and shoulder dystocia associated with macrosomia.  Also explained the ACOG guidelines that C/S to prevent a shoulder dystocia be done in a nondiabetic for an EFW greater than 5000 gms.  Pt is worried that she might go through the pains of labor only to have a cesarean section.  Explained that she may very well have a cesarean section dipite going into labor but that an epidural will help with the pain. Pt accepts this plan.  Also discussed option of induction of labor at around 41 weeks to prevent postdatism.  Pt is interested in this.  Will reduscuss at next weeks visit.  SS of labor discussed. Elease Hashimoto Breia Ocampo,MD

## 2009-12-16 NOTE — Progress Notes (Signed)
C/O lower abd burning.  Pt reasurred.  Already booked for F/U U/S for follow macrosomia.

## 2010-03-26 ENCOUNTER — Ambulatory Visit (HOSPITAL_BASED_OUTPATIENT_CLINIC_OR_DEPARTMENT_OTHER): Payer: Self-pay

## 2010-03-26 ENCOUNTER — Ambulatory Visit (HOSPITAL_BASED_OUTPATIENT_CLINIC_OR_DEPARTMENT_OTHER): Payer: Medicare (Managed Care) | Admitting: Gastroenterology

## 2010-03-26 VITALS — HR 75 | Resp 12

## 2010-03-26 DIAGNOSIS — K219 Gastro-esophageal reflux disease without esophagitis: Secondary | ICD-10-CM

## 2010-03-26 DIAGNOSIS — D369 Benign neoplasm, unspecified site: Secondary | ICD-10-CM

## 2010-03-26 NOTE — Progress Notes (Signed)
This 40 year old English speaking patient presents for follow up of her GERD.     Please see the dictated history.    Past Medical History  Patient Active Problem List:     ALLERGY, UNSPECIFIED [995.3]     ACUTE GASTRITIS W/O HEMORRHAGE [535.00]     CLOSTRIDIUM DIFFICILE [008.45]     PURE HYPERCHOLESTEROLEM [272.0]     GENERALIZED ANXIETY DIS [300.02]     Benign Positional Vertigo [386.11B]     Contraceptive Management [V25.9B]     Tubular Adenoma [229.9DZ]     GERD (gastroesophageal reflux disease) [530.81K]     Diarrhea [787.91]        Past Surgical History    NO SIGNIFICANT SURGICAL HISTORY          Review of Patient's Allergies indicates:   Erythromycin            Itching    Social Hx    Smoking Status: Never Smoker                      Alcohol Use: No                  Current outpatient prescriptions:  omeprazole (PRILOSEC) 20 MG capsule Take 1 capsule by mouth daily. Disp: 33 capsule Rfl: 12   Cetirizine HCl (ZYRTEC PO)  Disp:  Rfl:    ibuprofen (MOTRIN) 800 MG tablet Take 1 tablet by mouth every 8 (eight) hours as needed for Pain. with food or milk Disp: 20 tablet Rfl: 0   cetirizine (ZYRTEC) 10 MG tablet Take 10 mg by mouth daily. Disp:  Rfl:    MECLIZINE HCL 25 MG OR TABS 1 TABLET 3 TIMES DAILY AS NEEDED Disp: 60 Rfl: 0         Family Hx    Family History    Diabetes Mother     Hypertension Mother     Lipids Mother     Hypertension Father          REVIEW OF SYSTEMS:    Cardiovascular:  No chest pain, palpitations, MI or heart failure.  Pulmonary:  No pneumonia, TB or asthma.    Neuro:  No stroke, seizure or loss of consciousness.    Endocrine:  No diabetes or thyroid disease.      Physical Exam:  Vital Signs: P 75 RR 12 LMP 03/05/2010   General: Obese body habitus.   HEENT: Normocephalic, atrumatic, PERRLA, Extra occular motions intact. No scleral icterus. Pharynx benign. Tongue midline.  Pulmonary: Clear to auscultation and percussion. No rales, wheezes or rhonchi.  Cardiovascular: S1, S2,  no S3 or murmurs.  Discomfort on pressure on the sternum.  Abdominal: Normal bowel sounds. No tenderness on light or deep palpation. No hepatomegaly or splenomegaly.  Neurological: Sensation intact. Normal motor function. Normal gait.     Impression:  530.81K GERD (gastroesophageal reflux disease)  (primary encounter diagnosis)  229.9DZ Tubular adenoma    Medical Decision Making:  Please see dictation.    Reviewed with patient. Over 15 minutes spent with the patient, more than half of which were in counseling about the noted issues.

## 2010-03-27 NOTE — Progress Notes (Signed)
This encounter was opened in error.  Please disregard.

## 2010-05-13 LAB — GI CENTER CLINIC NOTE

## 2010-07-24 ENCOUNTER — Ambulatory Visit (HOSPITAL_BASED_OUTPATIENT_CLINIC_OR_DEPARTMENT_OTHER): Payer: Medicare (Managed Care) | Admitting: Gastroenterology

## 2010-07-24 DIAGNOSIS — K219 Gastro-esophageal reflux disease without esophagitis: Secondary | ICD-10-CM | POA: Insufficient documentation

## 2010-07-24 DIAGNOSIS — J301 Allergic rhinitis due to pollen: Secondary | ICD-10-CM | POA: Insufficient documentation

## 2010-10-25 ENCOUNTER — Ambulatory Visit (HOSPITAL_BASED_OUTPATIENT_CLINIC_OR_DEPARTMENT_OTHER): Payer: Medicare (Managed Care) | Admitting: Gastroenterology

## 2010-10-30 ENCOUNTER — Ambulatory Visit (HOSPITAL_BASED_OUTPATIENT_CLINIC_OR_DEPARTMENT_OTHER): Payer: No Typology Code available for payment source | Admitting: Gastroenterology

## 2010-10-30 VITALS — BP 118/71 | HR 86 | Temp 98.4°F | Resp 12 | Ht 63.0 in | Wt 248.0 lb

## 2010-10-30 DIAGNOSIS — D369 Benign neoplasm, unspecified site: Secondary | ICD-10-CM

## 2010-10-30 DIAGNOSIS — K219 Gastro-esophageal reflux disease without esophagitis: Secondary | ICD-10-CM

## 2010-10-30 DIAGNOSIS — J45909 Unspecified asthma, uncomplicated: Secondary | ICD-10-CM

## 2010-10-30 NOTE — Progress Notes (Signed)
This 40 year old English speaking patient presents for follow up or her GERD and asthma.     The patient notes that she has been having trouble with mild asthma. She denies any severe reflux but had her omeprazole increased by her primary care physician to twice a day.    We reviewed the importance of weight loss to treat both her asthma and her GERD.     The patient has had mild discomfort in the sternal area of her chest. She has had no dysphagia or vomiting. She gets some nausea when she is stressed. She has had no jaundice.    Past Medical History  Patient Active Problem List:     ALLERGY, UNSPECIFIED [995.3]     ACUTE GASTRITIS W/O HEMORRHAGE [535.00]     CLOSTRIDIUM DIFFICILE [008.45]     PURE HYPERCHOLESTEROLEM [272.0]     GENERALIZED ANXIETY DIS [300.02]     Benign Positional Vertigo [386.11B]     Contraceptive Management [V25.9B]     Tubular Adenoma [229.9DZ]     GERD (gastroesophageal reflux disease) [530.81K]     Diarrhea [787.91]        Past Surgical History    NO SIGNIFICANT SURGICAL HISTORY          Review of Patient's Allergies indicates:   Erythromycin            Itching    Social Hx    Smoking Status: Never Smoker                      Alcohol Use: No                  Current outpatient prescriptions:  Cetirizine HCl (ZYRTEC PO)  Disp:  Rfl:    ibuprofen (MOTRIN) 800 MG tablet Take 1 tablet by mouth every 8 (eight) hours as needed for Pain. with food or milk Disp: 20 tablet Rfl: 0   cetirizine (ZYRTEC) 10 MG tablet Take 10 mg by mouth daily. Disp:  Rfl:    MECLIZINE HCL 25 MG OR TABS 1 TABLET 3 TIMES DAILY AS NEEDED Disp: 60 Rfl: 0         Family Hx    Family History    Diabetes Mother     Hypertension Mother     Lipids Mother     Hypertension Father            REVIEW OF SYSTEMS:    Cardiovascular:  No chest pain, palpitations, MI, heart failure, HTN or hyperlipidemia.  Pulmonary:  No pneumonia, TB.    Neuro:  No stroke, seizure or loss of consciousness.    Endocrine:  No diabetes or  thyroid disease.      Physical Exam:  Vital Signs: BP 118/71   Pulse 86   Temp(Src) 98.4 F (36.9 C) (Temporal)   Resp 12   Ht 5\' 3"  (1.6 m)   Wt 248 lb (112.492 kg)   BMI 43.93 kg/m2   SpO2 99%   LMP 10/17/2010 Body mass index is 43.93 kg/(m^2).  General: Morbidly obese body habitus.   Pulmonary: Clear to auscultation and percussion. No rales, wheezes or rhonchi.  Cardiovascular: S1, S2, no S3 or murmurs.   Abdominal: Normal bowel sounds. No tenderness on light or deep palpation. No hepatomegaly or splenomegaly.  Neurological: Sensation intact. Normal motor function. Normal gait. Alert and oriented x 3    Impression:  530.81K GERD (gastroesophageal reflux disease)  (primary encounter diagnosis)  493.90T  Asthma  229.9DZ Tubular adenoma    Medical Decision Making:  The patient had an EGD and a colonoscopy on 10/06/08. She had a benign EGD and no polyps seen in follow up on her colonoscopy. A follow up colonoscopy should be done in 8/15. Sooner if symptomatic.    The patient was made aware that most asthma medications exacerbate GERD which can in turn exacerbate asthma. We reviewed routine treatments for GERD including avoiding mints, coffee, fatty foods and caffeine.    The patient was also advised on weight loss.    Reviewed with patient who agrees with our plan. Follow up with me  In 3 to 4 months. If GERD symptoms worsen, repeat the EGD. The patient had several questions about Barrett's esophagitis. She did not have this on her last EGD. Over 25 minutes spent with the patient, more than half of which were in counseling.

## 2011-02-26 ENCOUNTER — Ambulatory Visit (HOSPITAL_BASED_OUTPATIENT_CLINIC_OR_DEPARTMENT_OTHER): Payer: No Typology Code available for payment source | Admitting: Gastroenterology

## 2011-05-28 ENCOUNTER — Ambulatory Visit (HOSPITAL_BASED_OUTPATIENT_CLINIC_OR_DEPARTMENT_OTHER): Payer: No Typology Code available for payment source | Admitting: Gastroenterology

## 2011-05-28 VITALS — BP 123/75 | HR 88 | Temp 98.5°F | Resp 14 | Ht 64.0 in | Wt 248.0 lb

## 2011-05-28 DIAGNOSIS — K219 Gastro-esophageal reflux disease without esophagitis: Secondary | ICD-10-CM

## 2011-05-28 DIAGNOSIS — K921 Melena: Secondary | ICD-10-CM

## 2011-05-28 NOTE — Progress Notes (Signed)
This 41 year old English speaking patient presents for follow up.    The patient's father is dying from esophageal cancer. She is very concerned and was recently diagnosised with pneumonia. She notes that she has had a cough productive of blood tinged sputum. She has also had hard stools with a slight amount of blood. We reviewed her colonoscopy from 10/06/08. Follow up in 5 years was suggested. She noted that given her history of multiple polyps, 3 yrs might be more appropriate.    The patient denied weight loss, nausea, vomiting, hematemesis, melena, diarrhea, bowel urgency, constipation, fever or jaundice.    The patient has chest discomfort in the sternal area.    Past Medical History  Patient Active Problem List:     ALLERGY, UNSPECIFIED [995.3]     ACUTE GASTRITIS W/O HEMORRHAGE [535.00]     CLOSTRIDIUM DIFFICILE [008.45]     PURE HYPERCHOLESTEROLEM [272.0]     GENERALIZED ANXIETY DIS [300.02]     Benign Positional Vertigo [386.11B]     Contraceptive Management [V25.9B]     Tubular Adenoma [229.9DZ]     GERD (gastroesophageal reflux disease) [530.81K]     Diarrhea [787.91]        Past Surgical History    NO SIGNIFICANT SURGICAL HISTORY          Review of Patient's Allergies indicates:   Erythromycin            Itching    Social Hx    Smoking Status: Never Smoker                      Alcohol Use: No                  Current outpatient prescriptions:  Cetirizine HCl (ZYRTEC PO)  Disp:  Rfl:    ibuprofen (MOTRIN) 800 MG tablet Take 1 tablet by mouth every 8 (eight) hours as needed for Pain. with food or milk Disp: 20 tablet Rfl: 0   cetirizine (ZYRTEC) 10 MG tablet Take 10 mg by mouth daily. Disp:  Rfl:    MECLIZINE HCL 25 MG OR TABS 1 TABLET 3 TIMES DAILY AS NEEDED Disp: 60 Rfl: 0         Family Hx    Family History    Diabetes Mother     Hypertension Mother     Lipids Mother     Hypertension Father            REVIEW OF SYSTEMS:    Cardiovascular:  No chest pain, palpitations, MI, heart failure,  HTN or hyperlipidemia.  Pulmonary:  No pneumonia, TB or asthma.    Neuro:  No stroke, seizure or loss of consciousness.    Endocrine:  No diabetes or thyroid disease.      Physical Exam:  Vital Signs: BP 123/75   Pulse 88   Temp(Src) 98.5 F (36.9 C) (Temporal)   Resp 14   Ht 5\' 4"  (1.626 m)   Wt 248 lb (112.492 kg)   BMI 42.57 kg/m2   SpO2 97%   LMP 05/06/2011 Body mass index is 42.57 kg/(m^2).  General: Obese body habitus.   HEENT: Normocephalic, atrumatic, PERRLA, Extra occular motions intact. No scleral icterus. Pharynx benign. Tongue midline.  LN: Without cervical, supraclavicular or infraclavicular lymphadenopathy.  Pulmonary: Clear to auscultation and percussion. No rales, wheezes or rhonchi.  Cardiovascular: S1, S2, no S3 or murmurs.   Abdominal: Normal bowel sounds. No tenderness on light or deep  palpation. No hepatomegaly or splenomegaly.  Neurological: Sensation intact. Normal motor function. Normal gait. Alert and oriented x 3    Impression:  530.81K GERD (gastroesophageal reflux disease)  (primary encounter diagnosis)  578.1A Hematochezia    Medical Decision Making:  The patient is very worried about her father. She has had minimal symptoms but is concerned that she may have a malighant process. I reviewed her diet an lifestyle with her. She is eating too much, putting on weight, not sleeping and using coffee.    I have suggested she go back on her old diet, loose 5 lbs and return to see me in 3 months, sooner if any significant bleeding or pain. If still symptomatic, we may consider invasive testing. For now, I think a healthier lifestyle would be in order.    Reviewed with patient who agrees with our plan. Follow up with me in 3 months.

## 2011-09-18 ENCOUNTER — Ambulatory Visit (HOSPITAL_BASED_OUTPATIENT_CLINIC_OR_DEPARTMENT_OTHER): Payer: No Typology Code available for payment source | Admitting: Gastroenterology

## 2011-09-18 VITALS — BP 126/76 | HR 87 | Temp 98.4°F | Resp 12 | Wt 265.0 lb

## 2011-09-18 DIAGNOSIS — K219 Gastro-esophageal reflux disease without esophagitis: Secondary | ICD-10-CM

## 2011-09-18 DIAGNOSIS — R1013 Epigastric pain: Secondary | ICD-10-CM

## 2011-09-18 LAB — CBC, PLATELET & DIFFERENTIAL
ABSOLUTE BASO COUNT: 0 10*3/uL (ref 0.0–0.1)
ABSOLUTE EOSINOPHIL COUNT: 0.1 10*3/uL (ref 0.0–0.8)
ABSOLUTE IMM GRAN COUNT: 0.02 10*3/uL (ref 0.00–0.03)
ABSOLUTE LYMPH COUNT: 2.9 10*3/uL (ref 0.6–5.9)
ABSOLUTE MONO COUNT: 0.4 10*3/uL (ref 0.2–1.4)
ABSOLUTE NEUTROPHIL COUNT: 5.3 10*3/uL (ref 1.6–8.3)
BASOPHIL %: 0.3 % (ref 0.0–1.2)
EOSINOPHIL %: 1 % (ref 0.0–7.0)
HEMATOCRIT: 37.2 % (ref 34.1–44.9)
HEMOGLOBIN: 12 g/dL (ref 11.2–15.7)
IMMATURE GRANULOCYTE %: 0.2 % (ref 0.0–0.4)
LYMPHOCYTE %: 32.9 % (ref 15.0–54.0)
MEAN CORP HGB CONC: 32.3 g/dL (ref 31.0–37.0)
MEAN CORPUSCULAR HGB: 28.8 pg (ref 26.0–34.0)
MEAN CORPUSCULAR VOL: 89.2 fL (ref 80.0–100.0)
MEAN PLATELET VOLUME: 9.7 fL (ref 8.7–12.5)
MONOCYTE %: 4.8 % (ref 4.0–13.0)
NEUTROPHIL %: 60.8 % (ref 40.0–75.0)
PLATELET COUNT: 305 10*3/uL (ref 150–400)
RBC DISTRIBUTION WIDTH STD DEV: 42.3 fL (ref 35.1–46.3)
RBC DISTRIBUTION WIDTH: 13.2 % (ref 11.5–14.3)
RED BLOOD CELL COUNT: 4.17 M/uL (ref 3.90–5.20)
WHITE BLOOD CELL COUNT: 8.7 10*3/uL (ref 4.0–11.0)

## 2011-09-18 LAB — COMPREHENSIVE METABOLIC PANEL
ALANINE AMINOTRANSFERASE: 15 IU/L (ref 7–35)
ALBUMIN: 3.7 g/dl (ref 3.4–4.8)
ALKALINE PHOSPHATASE: 91 IU/L (ref 25–106)
ANION GAP: 2 mmol/L — ABNORMAL LOW (ref 3–11)
ASPARTATE AMINOTRANSFERASE: 17 IU/L (ref 8–34)
BILIRUBIN TOTAL: 0.2 mg/dl (ref 0.2–1.1)
BUN (UREA NITROGEN): 11 mg/dl (ref 6–20)
CALCIUM: 9.3 mg/dl (ref 8.6–10.3)
CARBON DIOXIDE: 29 mmol/L (ref 22–32)
CHLORIDE: 106 mmol/L (ref 101–111)
CREATININE: 0.9 mg/dl (ref 0.4–1.2)
ESTIMATED GLOMERULAR FILT RATE: >60 mL/min (ref >60)
Glucose Random: 87 mg/dl (ref 74–160)
POTASSIUM: 3.8 mmol/L (ref 3.5–5.1)
SODIUM: 137 mmol/L (ref 135–144)
TOTAL PROTEIN: 6.8 g/dl (ref 5.9–7.5)

## 2011-09-18 LAB — AMYLASE: AMYLASE: 46 U/L (ref 28–100)

## 2011-09-18 LAB — LIPASE: LIPASE: 24 U/L (ref 10–50)

## 2011-09-18 NOTE — Progress Notes (Signed)
This 41 year old English speaking patient presents for follow up of her GERD.    She does well on the medication. She notes that she has had a discomfort in the epigastrium just below the xyphoid which is radiating to the back. She notes that it is a sharp discomfort.    The patient denied odynophagia, early satiety, weight loss, nausea, vomiting, hematemesis, hematochezia, melena,  fever, anemia or jaundice.    Past Medical History  Patient Active Problem List:     ALLERGY, UNSPECIFIED     ACUTE GASTRITIS W/O HEMORRHAGE     CLOSTRIDIUM DIFFICILE     PURE HYPERCHOLESTEROLEM     GENERALIZED ANXIETY DIS     Benign Positional Vertigo     Contraceptive Management     Tubular Adenoma     GERD (gastroesophageal reflux disease)     Diarrhea          Past Surgical History    NO SIGNIFICANT SURGICAL HISTORY         Review of Patient's Allergies indicates:   Erythromycin            Itching    Social Hx    Smoking Status: Never Smoker                      Alcohol Use: No                  Current Outpatient Prescriptions:  Cetirizine HCl (ZYRTEC PO)  Disp:  Rfl:    EXPIRED: ibuprofen (MOTRIN) 800 MG tablet Take 1 tablet by mouth every 8 (eight) hours as needed for Pain. with food or milk Disp: 20 tablet Rfl: 0   cetirizine (ZYRTEC) 10 MG tablet Take 10 mg by mouth daily. Disp:  Rfl:    EXPIRED: MECLIZINE HCL 25 MG OR TABS 1 TABLET 3 TIMES DAILY AS NEEDED Disp: 60 Rfl: 0     No current facility-administered medications for this visit.    Family Hx    Family History    Diabetes Mother     Hypertension Mother     Lipids Mother     Hypertension Father          REVIEW OF SYSTEMS:    Cardiovascular:  No chest pain, palpitations, MI, heart failure, HTN or hyperlipidemia.  Pulmonary:  No pneumonia, TB or asthma.    Neuro:  No stroke, seizure or loss of consciousness.    Endocrine:  No diabetes or thyroid disease.      Physical Exam:  Vital Signs: BP 126/76   Pulse 87   Temp(Src) 98.4 F (36.9 C) (Temporal)   Resp 12    Wt 265 lb (120.203 kg)   BMI 45.46 kg/m2   SpO2 96%   LMP 09/16/2011  General: Obese body habitus.   Pulmonary: Clear to auscultation and percussion. No rales, wheezes or rhonchi. No chest pain on compression.  Cardiovascular: S1, S2, no S3 or murmurs.    Abdominal: Normal bowel sounds. No tenderness on light or deep palpation. No hepatomegaly or splenomegaly.  Neurological: Sensation intact. Normal motor function. Normal gait. Alert and oriented x 3    Impression:  GERD (gastroesophageal reflux disease)  (primary encounter diagnosis)  Epigastric discomfort      Medical Decision Making:  The patient is doing well, although she lost her father recently.     She is having some discomfort in the epigastric/chest pain. We will check a CMP, CBC, amylase and lipase.  If the pain persists, consider a CT.    Reviewed with patient who agrees with our plan. Follow up with me in 2 months.

## 2011-09-29 ENCOUNTER — Encounter (HOSPITAL_BASED_OUTPATIENT_CLINIC_OR_DEPARTMENT_OTHER): Payer: Self-pay | Admitting: Gastroenterology

## 2011-09-29 NOTE — Progress Notes (Signed)
Quick Note:    Normal labs, letter sent  ______

## 2011-11-24 ENCOUNTER — Ambulatory Visit (HOSPITAL_BASED_OUTPATIENT_CLINIC_OR_DEPARTMENT_OTHER): Payer: No Typology Code available for payment source | Admitting: Gastroenterology

## 2012-03-29 ENCOUNTER — Ambulatory Visit (HOSPITAL_BASED_OUTPATIENT_CLINIC_OR_DEPARTMENT_OTHER): Payer: No Typology Code available for payment source | Admitting: Gastroenterology

## 2012-03-29 ENCOUNTER — Encounter (HOSPITAL_BASED_OUTPATIENT_CLINIC_OR_DEPARTMENT_OTHER): Payer: Self-pay | Admitting: Gastroenterology

## 2012-03-29 VITALS — BP 116/74 | HR 82 | Wt 272.0 lb

## 2012-03-29 DIAGNOSIS — K219 Gastro-esophageal reflux disease without esophagitis: Secondary | ICD-10-CM

## 2012-03-29 NOTE — Progress Notes (Signed)
This 42 year old English speaking patient presents for a follow up evaluation of abdominal pain and GERD.    She notes she is feeling better. Denies any pain, just abdominal fullness.  Denies any nausea or vomiting.  Denies any changes to her bowel movements or hematochezia.  She continues to take omeprazole 40 mg daily, rarely takes twice a day as pain improved and does not require.    She has begun exercising and trying to lose weight.  She has also cut back on the amount of her food and dieting.  She has an appointment coming up with her nutritionist at [facility].    She notes she still has continued stress in her life, but doing a little better since her father's death.      Patient Active Problem List:     ALLERGY, UNSPECIFIED     ACUTE GASTRITIS W/O HEMORRHAGE     CLOSTRIDIUM DIFFICILE     PURE HYPERCHOLESTEROLEM     GENERALIZED ANXIETY DIS     Benign Positional Vertigo     Contraceptive Management     Tubular Adenoma     GERD (gastroesophageal reflux disease)     Diarrhea      Past Surgical Hx      Past Surgical History    NO SIGNIFICANT SURGICAL HISTORY         Allergies  Review of Patient's Allergies indicates:   Erythromycin            Itching    Social Hx    Smoking Status: Never Smoker                      Alcohol Use: No                  Current Outpatient Prescriptions:  omeprazole (PRILOSEC) 40 MG capsule Take 40 mg by mouth daily. Disp:  Rfl:    Cetirizine HCl (ZYRTEC PO)  Disp:  Rfl:    ibuprofen (MOTRIN) 800 MG tablet Take 1 tablet by mouth every 8 (eight) hours as needed for Pain. with food or milk Disp: 20 tablet Rfl: 0   cetirizine (ZYRTEC) 10 MG tablet Take 10 mg by mouth daily. Disp:  Rfl:    MECLIZINE HCL 25 MG OR TABS 1 TABLET 3 TIMES DAILY AS NEEDED Disp: 60 Rfl: 0     No current facility-administered medications for this visit.    Family History    Family History    Diabetes Mother     Hypertension Mother     Lipids Mother     Hypertension Father      The patient has no family history of colon  cancer, colon polyps, IBD, Ulcerative colitis, Crohn's disease, gastric cancer, celiac sprue, liver disease, pancreatic disease, IBS, ulcer disease or anesthesia complications.    REVIEW OF SYSTEMS:  HEENT: No loose teeth, nosebleeds, glaucoma, cataracts    Cardiovascular:  No chest pain, palpitations, HTN, hypercholesterolemia, MI, heart surgery or heart failure.  Pulmonary:  No pneumonia, COPD, chronic cough,TB or asthma.    Neuro:  No stroke, seizure, migraines or loss of consciousness.    Endocrine:  No diabetes or thyroid disease.    Immunology:  No TB, hepatitis A, hepatitis B, hepatitis C, HIV or rheumatic fever.    GU:  No hematuria, nephrolithiasis, kidney disease or bladder disease.  Heme/Onc:  No cancer, anemia or bleeding diathesis.   Derm:  No rash, hives, or shingles.  Psych: No anxiety disorder, depression, bullemia, anorexia nervosa, alcoholism or substance abuse  Musculoskeletal: No gout, arthritis, muscle disease or lupus.    Physical Exam:  Vital Signs: BP 116/74   Pulse 82   Wt 272 lb (123.378 kg)   BMI 46.67 kg/m2   SpO2 98% Body mass index is 46.67 kg/(m^2).  General Appearance:  NAD, well nourished well developed/obese  Eyes: anicteric  Mouth: normal mucosae, no lesions  Neck: supple, no cervical or supraclavicular lymphadenopathy, no masses    Lungs: clear to auscultation bilaterally, no W/R/R  Heart: RRR, no murmurs/ rubs/ gallops  Abdomen: soft, non distended, non tender to light and deep palpation throughout, normal bowel sounds X4, no hepatosplenomegaly appreciated  Lower extremities:no clubbing, cyanosis or edema  Skin: no rashes, no lesions  Psych: AAOx3, normal speech and coordinated, good eye contact throughout      Pertinent Labs:  Component      Latest Ref Rng 09/18/2011   WHITE BLOOD CELL      4.0 - 11.0 TH/uL 8.7   HEMOGLOBIN      11.2 - 15.7 g/dL 25.9   HEMATOCRIT      34.1 - 44.9 % 37.2   MEAN CORPUSCULAR VOL      80.0 - 100.0 fL 89.2   PLATELET COUNT      150 - 400 TH/uL 305    SODIUM      135 - 144 mmol/L 137   POTASSIUM      3.5 - 5.1 mmol/L 3.8   CHLORIDE      101 - 111 mmol/L 106   CARBON DIOXIDE      22 - 32 mmol/L 29   ANION GAP      3 - 11 mmol/L 2 (L)   CALCIUM      8.6 - 10.3 mg/dl 9.3   Glucose Random      74 - 160 mg/dl 87   BLOOD UREA NITROGEN      6 - 20 mg/dl 11   TOTAL PROTEIN      5.9 - 7.5 g/dl 6.8   ALBUMIN      3.4 - 4.8 g/dl 3.7   BILIRUBIN TOTAL      0.2 - 1.1 mg/dl 0.2   ALKALINE PHOSPHATASE      25 - 106 IU/L 91   ASPARTATE AMINOTRANSFERASE (AST)      8 - 34 IU/L 17   CREATININE      0.4 - 1.2 mg/dl 0.9   ESTIMATED GLOMERULAR FILT RATE      > 60 ML/MIN > 60   ALANINE AMINOTRANSFERASE (ALT)      7 - 35 IU/L 15   AMYLASE      28 - 100 U/L 46   LIPASE      10 - 50 U/L 24       Impression:  Esophageal reflux  (primary encounter diagnosis)    Medical Decision Making:  Patient is significantly improved from prior.  At this time hold off on any further work-up.  Appears her symptoms were acutely worsened by stress, but now improved as stress is also improving.    Continue daily omeprazole 40 mg.    Encouraged continued exercise and goal of weight loss.      Reviewed with patient who is in agreement with our plan. Return to office if pain recurs.

## 2012-03-29 NOTE — Progress Notes (Signed)
I, Dr. Merwyn Katos, personally interviewed, examined and reviewed the chart of this patient and formulated the plan.  Patient was seen together with Annette Stable, please see her note from today's visit for the complete evaluation.    The patient is doing very well. She is only using her omeprzaole once a day.     Physical exam unchanged.    She will follow up with me in 6 months. Continue omeprazole once a day. Of note, follow up colonoscopy in 2015, sooner if symptomatic.

## 2012-07-20 ENCOUNTER — Ambulatory Visit (HOSPITAL_BASED_OUTPATIENT_CLINIC_OR_DEPARTMENT_OTHER): Payer: No Typology Code available for payment source | Admitting: Gastroenterology

## 2012-07-20 VITALS — BP 142/95 | HR 101 | Temp 99.4°F | Resp 20 | Wt 281.0 lb

## 2012-07-20 DIAGNOSIS — R1013 Epigastric pain: Secondary | ICD-10-CM

## 2012-07-20 DIAGNOSIS — K219 Gastro-esophageal reflux disease without esophagitis: Secondary | ICD-10-CM

## 2012-07-20 LAB — COMPREHENSIVE METABOLIC PANEL
ALANINE AMINOTRANSFERASE: 17 U/L (ref 12–45)
ALBUMIN: 3.7 g/dL (ref 3.4–5.0)
ALKALINE PHOSPHATASE: 105 U/L (ref 45–117)
ANION GAP: 12 mmol/L (ref 5–15)
ASPARTATE AMINOTRANSFERASE: 6 U/L — ABNORMAL LOW (ref 8–34)
BILIRUBIN TOTAL: 0.3 mg/dL (ref 0.2–1.0)
BUN (UREA NITROGEN): 14 mg/dL (ref 7–18)
CALCIUM: 9.3 mg/dL (ref 8.5–10.1)
CARBON DIOXIDE: 26 mmol/L (ref 21–32)
CHLORIDE: 98 mmol/L (ref 98–107)
CREATININE: 0.6 mg/dL (ref 0.4–1.2)
ESTIMATED GLOMERULAR FILT RATE: >60 mL/min (ref >60)
Glucose Random: 95 mg/dL (ref 74–160)
POTASSIUM: 4 mmol/L (ref 3.5–5.1)
SODIUM: 136 mmol/L (ref 136–145)
TOTAL PROTEIN: 7.7 g/dL (ref 6.4–8.2)

## 2012-07-20 LAB — AMYLASE: AMYLASE: 37 U/L (ref 25–115)

## 2012-07-20 LAB — LIPASE: LIPASE: 84 U/L (ref 73–393)

## 2012-07-20 NOTE — Progress Notes (Signed)
This 42 year old English speaking patient presents for follow up. She notes a new epigastric pain.    The patient ate macaroni and sauce. She developed a burning epigastric pain which has not gone away. She used tylenol to treat it. She feels like she has heartburn.    The patient denied regurgitation, dysphagia, odynophagia, early satiety, weight loss, nausea, vomiting, hematemesis, hematochezia, melena, diarrhea, bowel urgency, constipation, fever, anemia or jaundice. She felt that she had chills today.    The patient has gained a significant amount of weight.    Past Medical History  Patient Active Problem List:     ALLERGY, UNSPECIFIED     ACUTE GASTRITIS W/O HEMORRHAGE     CLOSTRIDIUM DIFFICILE     PURE HYPERCHOLESTEROLEM     GENERALIZED ANXIETY DIS     Benign Positional Vertigo     Contraceptive Management     Tubular Adenoma     GERD (gastroesophageal reflux disease)     Diarrhea          Past Surgical History    NO SIGNIFICANT SURGICAL HISTORY         Review of Patient's Allergies indicates:   Erythromycin            Itching    Social Hx    Smoking Status: Never Smoker                      Alcohol Use: No                  Current Outpatient Prescriptions:  omeprazole (PRILOSEC) 40 MG capsule Take 40 mg by mouth daily. Disp:  Rfl:    Cetirizine HCl (ZYRTEC PO)  Disp:  Rfl:    ibuprofen (MOTRIN) 800 MG tablet Take 1 tablet by mouth every 8 (eight) hours as needed for Pain. with food or milk Disp: 20 tablet Rfl: 0   cetirizine (ZYRTEC) 10 MG tablet Take 10 mg by mouth daily. Disp:  Rfl:    MECLIZINE HCL 25 MG OR TABS 1 TABLET 3 TIMES DAILY AS NEEDED Disp: 60 Rfl: 0     No current facility-administered medications for this visit.    Family Hx    Family History    Diabetes Mother     Hypertension Mother     Lipids Mother     Hypertension Father      REVIEW OF SYSTEMS:  HEENT: No loose teeth, nosebleeds, glaucoma, cataracts     Cardiovascular:  No chest pain, palpitations, HTN, hypercholesterolemia,  MI or heart failure.  Pulmonary:  No pneumonia, COPD, chronic cough,TB or asthma.     Neuro:  No stroke, seizure, migraines or loss of consciousness.     Endocrine:  No diabetes or thyroid disease.     Immunology:  No TB, hepatitis A, hepatitis B, hepatitis C, HIV or rheumatic fever.    Physical Exam:  Vital Signs: BP 142/95   Pulse 101   Temp(Src) 99.4 F (37.4 C)   Resp 20   Wt 281 lb (127.461 kg)   BMI 48.21 kg/m2   SpO2 97% Body mass index is 48.21 kg/(m^2).  General: Morbid obesity body habitus.   HEENT: Normocephalic, atrumatic, PERRLA, Extra occular motions intact. No scleral icterus. Pharynx benign. Tongue midline.  LN: Without cervical, supraclavicular or infraclavicular lymphadenopathy.  Pulmonary: Clear to auscultation and percussion. No rales, wheezes or rhonchi.  Cardiovascular: S1, S2, no S3 or murmurs.   Abdominal: Normal bowel sounds. Slight  tenderness on light or deep palpation, worse at the xyphoid process and the RUQ. No hepatomegaly or splenomegaly.  Neurological: ensation intact. Normal motor function. Normal gait. Alert and oriented x 3    Impression:  Epigastric pain  (primary encounter diagnosis)  GERD (gastroesophageal reflux disease)    Medical Decision Making:  The patient developed a sharp pain in her epigastric area after eating. She is on twice daily omeprazole. She is tender in the RUQ and at the xyphoid.    She has put on a great deal of weight. I am concerned about musculoskeletal pain and gallstone disease. We will check a CMP, amylase, lipase and ultrasound. She was advised to go on a clear liquid diet. Should the pain worsen, she will go to the ED for evaluation and treatment. I am out of town for a few days and the patient was directed to follow up with Dr. Rene Kocher in my absense.    Reviewed with patient who agrees with our plan. Follow up with me in 2 weeks, sooner if any worsening.

## 2012-07-21 ENCOUNTER — Encounter (HOSPITAL_BASED_OUTPATIENT_CLINIC_OR_DEPARTMENT_OTHER): Payer: Self-pay

## 2012-07-21 ENCOUNTER — Emergency Department (HOSPITAL_BASED_OUTPATIENT_CLINIC_OR_DEPARTMENT_OTHER)
Admission: RE | Admit: 2012-07-21 | Disposition: A | Discharge status: Home / Self Care | Payer: Self-pay | Source: Emergency Department | Attending: Emergency Medicine | Admitting: Emergency Medicine

## 2012-07-21 ENCOUNTER — Telehealth (HOSPITAL_BASED_OUTPATIENT_CLINIC_OR_DEPARTMENT_OTHER): Payer: Self-pay

## 2012-07-21 LAB — CBC, PLATELET & DIFFERENTIAL
ABSOLUTE BASO COUNT: 0 10*3/uL (ref 0.0–0.1)
ABSOLUTE EOSINOPHIL COUNT: 0.3 10*3/uL (ref 0.0–0.8)
ABSOLUTE IMM GRAN COUNT: 0.03 10*3/uL (ref 0.00–0.03)
ABSOLUTE LYMPH COUNT: 2.6 10*3/uL (ref 0.6–5.9)
ABSOLUTE MONO COUNT: 1 10*3/uL (ref 0.2–1.4)
ABSOLUTE NEUTROPHIL COUNT: 8.6 10*3/uL — ABNORMAL HIGH (ref 1.6–8.3)
BASOPHIL %: 0.2 % (ref 0.0–1.2)
EOSINOPHIL %: 2.6 % (ref 0.0–7.0)
HEMATOCRIT: 37.7 % (ref 34.1–44.9)
HEMOGLOBIN: 11.9 g/dL (ref 11.2–15.7)
IMMATURE GRANULOCYTE %: 0.2 % (ref 0.0–0.4)
LYMPHOCYTE %: 20.9 % (ref 15.0–54.0)
MEAN CORP HGB CONC: 31.6 g/dL (ref 31.0–37.0)
MEAN CORPUSCULAR HGB: 28.1 pg (ref 26.0–34.0)
MEAN CORPUSCULAR VOL: 88.9 fL (ref 80.0–100.0)
MEAN PLATELET VOLUME: 9.7 fL (ref 8.7–12.5)
MONOCYTE %: 7.6 % (ref 4.0–13.0)
NEUTROPHIL %: 68.5 % (ref 40.0–75.0)
PLATELET COUNT: 266 10*3/uL (ref 150–400)
RBC DISTRIBUTION WIDTH STD DEV: 44.7 fL (ref 35.1–46.3)
RBC DISTRIBUTION WIDTH: 14.1 % (ref 11.5–14.3)
RED BLOOD CELL COUNT: 4.24 M/uL (ref 3.90–5.20)
WHITE BLOOD CELL COUNT: 12.6 10*3/uL — ABNORMAL HIGH (ref 4.0–11.0)

## 2012-07-21 LAB — HOLD GREEN TOP TUBE

## 2012-07-21 LAB — URINE PREGNANCY TEST (POINT OF CARE): HCG QUALITATIVE URINE: NEGATIVE

## 2012-07-21 LAB — POC URINALYSIS
BILIRUBIN, URINE: NEGATIVE
GLUCOSE,URINE: NEGATIVE
KETONE, URINE: NEGATIVE
LEUKOCYTE ESTERASE: NEGATIVE
NITRITE, URINE: NEGATIVE
OCCULT BLOOD, URINE: NEGATIVE
PH URINE: 6.5 (ref 5.0–8.0)
PROTEIN, URINE: NEGATIVE
SPECIFIC GRAVITY, URINE: 1.01 (ref 1.003–1.030)
UROBILINOGEN URINE: 0.2 (ref 0.2–1.0)

## 2012-07-21 LAB — HOLD BLUE TOP TUBE

## 2012-07-21 MED ORDER — LIDOCAINE VISCOUS 2 % MT SOLN
5.00 mL | Freq: Once | OROMUCOSAL | Status: AC
Start: 2012-07-21 — End: 2012-07-21
  Administered 2012-07-21: 5 mL via OROMUCOSAL
  Filled 2012-07-21: qty 15

## 2012-07-21 MED ORDER — FAMOTIDINE 20 MG PO TABS
20.00 mg | ORAL_TABLET | Freq: Once | ORAL | Status: AC
Start: 2012-07-21 — End: 2012-07-21
  Administered 2012-07-21: 20 mg via ORAL
  Filled 2012-07-21: qty 1

## 2012-07-21 MED ORDER — SODIUM CHLORIDE 0.9 % IV SOLN
1000.0000 mL | Freq: Once | INTRAVENOUS | Status: AC
Start: 2012-07-21 — End: 2012-07-21
  Administered 2012-07-21: 1000 mL via INTRAVENOUS

## 2012-07-21 MED ORDER — ALUMINUM & MAGNESIUM HYDROXIDE 200-200 MG/5ML PO SUSP
30.00 mL | Freq: Once | ORAL | Status: AC
Start: 2012-07-21 — End: 2012-07-21
  Administered 2012-07-21: 30 mL via ORAL
  Filled 2012-07-21: qty 30

## 2012-07-21 NOTE — Progress Notes (Signed)
Pt called to inquire about her lab results from yesterday.  Even though she knew that the Dr Suzie Portela was on vacation she insisted that she had to know what the labs were.  Explaining that the Doctor himself would want to share the results she still wanted the results told to her over the phone..  There were too many results and I suggested that I would print them up and send them to her. She decided that was not necessary and because her Primary doctor was at [facility] she was unable to discuss the results with him.  She also decided that she would contact  Dr Suzie Portela when he returned from vacation.

## 2012-07-21 NOTE — ED Notes (Signed)
Assumed care of patient. Resting comfortably. Awaiting Surgery consult.

## 2012-07-21 NOTE — ED Notes (Signed)
Pt. Ambulatory to U/S.

## 2012-07-21 NOTE — ED Triage Note (Signed)
Pt c/o epigastric pain- burning since Monday- which is better but now just complains of generalized abd pain.  Denies nausea, vomiting or diarrhea.

## 2012-07-21 NOTE — ED Notes (Signed)
Re mains waiting surgery to see. Pain 2/10 scale. Mostly with movement. No n/v now.

## 2012-07-21 NOTE — ED Provider Notes (Addendum)
ED Provider Note    I have reviewed the ED nursing notes and prior records. I have reviewed the patient's past medical history/problem list, allergies, social history and medication list.      HPI:    This 42 year old female presents with chief complaint of abdominal pain.  The patient reports she's had abdominal pain for the last 3 days.  Over the weekend, she would've several graduation party recently she over ate.  She states she saw her gastroenterologist already, and had outpatient labs performed.  Had an ultrasound scheduled in two days, but reports the pain is too bad to wait.  The pain is described as a burning sensation.  It is across her upper abdomen and radiates to her back.  It does not radiate to her chest.  There is no associated shortness of breath or chest pain with it.  No fevers.  No changes in bowel habits or urinary habits.  No fevers.  She has taken extra omeprazole with some minimal improvement.  She has been taking Tylenol with some improvement as well.    ROS: Pertinent positives were reviewed as per the HPI above. All other systems were reviewed and are negative.    Past Medical/Surgical History: She  has a past medical history of ACUTE GASTRITIS W/O HEMORRHAGE (06/12/2004); CLOSTRIDIUM DIFFICILE (07/26/2004); ALLERGY, UNSPECIFIED (03/26/2004); and Esophageal reflux. and  has past surgical history that includes NO SIGNIFICANT SURGICAL HISTORY.  Family History: Her family history includes Diabetes in her mother; Hypertension in her father and mother; and Lipids in her mother., Neg for premature CAD, or PE/DVT.  Social History: The patient  reports that she has never smoked. She does not have any smokeless tobacco history on file. She reports that she does not drink alcohol or use illicit drugs..Allergies: She is allergic to erythromycin.   Medications:   No current facility-administered medications on file prior to encounter.  Current Outpatient Prescriptions on File Prior to  Encounter:  omeprazole (PRILOSEC) 40 MG capsule Take 40 mg by mouth daily. Disp:  Rfl:    cetirizine (ZYRTEC) 10 MG tablet Take 10 mg by mouth daily. Disp:  Rfl:    Cetirizine HCl (ZYRTEC PO)  Disp:  Rfl:    ibuprofen (MOTRIN) 800 MG tablet Take 1 tablet by mouth every 8 (eight) hours as needed for Pain. with food or milk Disp: 20 tablet Rfl: 0   MECLIZINE HCL 25 MG OR TABS 1 TABLET 3 TIMES DAILY AS NEEDED Disp: 60 Rfl: 0     Physical Exam:  BP 129/84   Pulse 105   Temp(Src) 99.3 F   Resp 20   Wt 124.739 kg (275 lb)   BMI 47.18 kg/m2   SpO2 98%   LMP 06/20/2012  GENERAL:  Well-developed, alert & oriented x 4, no distress but mildly anxious.  SKIN:  Warm & Dry, no rash, no bruising,   HEAD:  Atraumatic. PERRL. Oropharynx without tonsillar exudates or redness. No edema. No icteric sclera.  NECK:  No meningismus.  No LAN. No stridor. Neg thyromegaly  LUNGS:  Clear to auscultation bilaterally without rales, rhonchi or wheezing.   HEART:  RRR.  No murmurs, rubs, or gallops.   ABDOMEN:  Obese, Soft, flat, without distension. Tender across the epigastrium. No rebound or guarding  MUSCULOSKELETAL:  No deformities.  Normal pulses.  NEUROLOGIC:  Normal speech. Normal gait and station.  PSYCHIATRIC:  Normal affect    ED Course and Medical Decision-making:  This is a 42 year old  female who presents emergency Department with upper abdominal discomfort for the past 2 days, described as a burning sensation after overeating several parties over the weekend.  She is [facility] gastroenterology, had outpatient labs performed yesterday and an ultrasound scheduled for in 2 days however she reports the pain is too severe to wait.  Examination, she is an obese abdomen tender to palpation across the epigastrium.  Without peritoneal signs.  Suspect GERD/gastritis versus gallbladder pathology.  Labs reviewed from yesterday are reassuring.  Ultrasound ordered.  GI cocktail provided.  Ultrasound wall thickening gallstones into the neck long  pericholecystic fluid.  Labs were obtained and while liver panel is reassuring as is lipase, she does have a mild leukocytosis.  The patient is being signed out at this time pending a surgical evaluation and continued discomfort, possible admission versus scheduled procedure in the future.  Labs Reviewed   CBC + PLT + AUTO DIFF - Abnormal; Notable for the following:     WHITE BLOOD CELL 12.6 (*)     ABSOLUTE NEUT COUNT AUTO 8.6 (*)     All other components within normal limits   URINE PREGNANCY TEST (POINT OF CARE) - Normal   COMPREHENSIVE METABOLIC PANEL   LIPASE   POC URINALYSIS   HOLD GREEN TOPE TUBE   HOLD BLUE TOP TUBE       Disposition pending    Diagnosis/Diagnoses:  Cholelithiasis    TASH, GABRIEL PA-C      ADDENDUM:  I performed a history and physical examination of the patient and discussed patient's management with the PA. I reviewed the PA's note and agree with the documented finding and plan of care.    Patient with epigastric & RUQ tenderness, u/s showing gallstones with some signs of likely early cholecystitis.  Consulted with surgery, who saw & evaluated patient in ED.  They recommended that patient be started on Augmentin (given 1st dose in ED), and to have follow-up appointment in clinic (they booked 07/23/12 appt with Dr. Veto Kemps at 10:30 am); given strict instructions to return to ED for any worsening symptoms.    Diagnoses:  Abdominal pain  Gallstones  Cholecystitis    Kurtis Bushman, MD

## 2012-07-21 NOTE — ED Notes (Signed)
Surgery calling back now for consult.

## 2012-07-21 NOTE — ED Notes (Signed)
Surgery here to see now.

## 2012-07-22 DIAGNOSIS — K819 Cholecystitis, unspecified: Secondary | ICD-10-CM

## 2012-07-22 LAB — US ABDOMEN PELVIS SCROTUM & OR RETROPERITONEUM DOPPLER LIMITED

## 2012-07-22 LAB — US ABDOMEN COMPLETE

## 2012-07-22 MED ORDER — AMOXICILLIN-POT CLAVULANATE 875-125 MG PO TABS
1.00 | ORAL_TABLET | Freq: Once | ORAL | Status: AC
Start: 2012-07-22 — End: 2012-07-22
  Administered 2012-07-22: 1 via ORAL
  Filled 2012-07-22: qty 1

## 2012-07-22 MED ORDER — AMOXICILLIN-POT CLAVULANATE 875-125 MG PO TABS
1.00 | ORAL_TABLET | Freq: Two times a day (BID) | ORAL | Status: AC
Start: 2012-07-22 — End: 2012-08-01

## 2012-07-22 NOTE — Consults (Addendum)
- SURGERY CONSULT NOTE -    Reason for Consult: Epigastric and RUQ abdominal pain, ? cholecystitis      Consult Requested by: Dr. Avie Echevaria    HPI:  50F presenting with epigastric and RUQ abdominal pain for the past 3-4 days.  She has known gastritis and GERD but reports this pain feels different.  She reports she went to more than one graduation party over the weekend where she ate excessive amounts of food including [place] food, pasta with red sauce, and spicy [place] food.  She then developed sharp epigastric pain that resolved on its own after several hours.  The pain radiated into her back.  She also had some mild RUQ pain.  She now reports the sharp pain is gone but she does have discomfort across her upper abdomen.  Pain currently is 2/10.  She states it improved after getting GI cocktail on arrival to ED.  She reports she had some chills over the past three days but did not take her temperature.  She denies any nausea or vomiting.  She took omeprazole and tylenol at home with some relief.  Of note, the patient has tested positive for H. Pylori in the past.  She also takes Omeprazole BID for GERD.  She reports she has been taking this as prescribed.  The patient denies any change in her bowel habits, denies any blood in her stool or dark stool.  She is voiding without difficulty.      Inpatient Problem List:   Patient Active Problem List:     ALLERGY, UNSPECIFIED     ACUTE GASTRITIS W/O HEMORRHAGE     CLOSTRIDIUM DIFFICILE     PURE HYPERCHOLESTEROLEM     GENERALIZED ANXIETY DIS     Benign Positional Vertigo     Contraceptive Management     Tubular Adenoma     GERD (gastroesophageal reflux disease)     Diarrhea     Cholecystitis      Past Medical History:     Past Medical History    ACUTE GASTRITIS W/O HEMORRHAGE 06/12/2004    Comment: H. Pylori positive (done at [facility]); GI consult Dr. Sharrie Rothman, TOC 10/01/04    CLOSTRIDIUM DIFFICILE 07/26/2004    Comment: GI cosult: Dr. Suzie Portela, TOC done on 10/01/04     ALLERGY, UNSPECIFIED 03/26/2004    Comment: Seen by ENT, deviated septum, hypertrophy of inferior nasal turbinates    Esophageal reflux        Past Surgical History:       Past Surgical History    NO SIGNIFICANT SURGICAL HISTORY         Medications prior to Admission:     No current facility-administered medications on file prior to encounter.  Current Outpatient Prescriptions on File Prior to Encounter:  omeprazole (PRILOSEC) 40 MG capsule Take 40 mg by mouth daily. Disp:  Rfl:    cetirizine (ZYRTEC) 10 MG tablet Take 10 mg by mouth daily. Disp:  Rfl:    Cetirizine HCl (ZYRTEC PO)  Disp:  Rfl:    ibuprofen (MOTRIN) 800 MG tablet Take 1 tablet by mouth every 8 (eight) hours as needed for Pain. with food or milk Disp: 20 tablet Rfl: 0   MECLIZINE HCL 25 MG OR TABS 1 TABLET 3 TIMES DAILY AS NEEDED Disp: 60 Rfl: 0       Current Inpatient Medications:    [COMPLETED] amoxicillin-clavulanate  1 tablet Oral Once    [COMPLETED] lidocaine  5 mL Mouth/Throat Once    [  COMPLETED] aluminum-magnesium hydroxide  30 mL Oral Once    [COMPLETED] famotidine  20 mg Oral Once           Allergies:   Review of Patient's Allergies indicates:   Erythromycin            Itching    Tobacco Use:     Smoking status: Never Smoker    Smokeless tobacco: Not on file       Alcohol:     Alcohol Use: No       Family History:     Family History    Diabetes Mother     Hypertension Mother     Lipids Mother     Hypertension Father           Review of Systems:   Systems reviewed and all systems negative except for the HPI.    Physical Exam:   Gen: awake and alert, appears comfortable, moves about the stretcher easily, NAD.  HEENT: normocephalic and atraumatic, mucous membranes are moist, sclera are anicteric  CV: RRR  Lungs: CTA bilaterally  Abd: obese, soft, mildly ttp in the epigastrium and moderately ttp in the RUQ, (-) Murphy's sign, + BS throughout, no guarding or rebound  Ext: warm, dry     Vital Signs - Last 24 Hours:   Temp:  [98.6 F (37 C)-99.3  F (37.4 C)] 98.6 F (37 C)  Pulse:  [96-106] 96  Resp:  [18-20] 18  BP: (118-143)/(58-84) 125/71 mmHg    Vital Signs - Last 8 Hours:   BP: (118-143)/(58-84)   Temp:  [98.6 F (37 C)-99.3 F (37.4 C)]   Pulse:  [96-106]   Resp:  [18-20]   SpO2:  [97 %-98 %]     Intake/Output last 24hours (7a-7a):        Last 8 Hours:        DATA LAST 24 HOURS:     All data in last 24hours, labs only:    Recent labs:  Recent Labs 07/21/12  2048   WBC 12.6*   HGB 11.9   HCT 37.7   PLTA 266   NEUT 68.5   LYMPH 20.9   MO 7.6   EOS 2.6   BASO 0.2       Recent Labs 07/21/12  2048   NA 138   K 3.8   CL 99   CO2 29   BUN 10   CREAT 0.7   GLUCOSER 86   CA 9.5   TBILI 0.4   AST 14   ALT 23   ALKPHOS 98   ALBUMIN 3.5         Component Value Date   UACOL YELLOW 07/21/2012   UACOL yellow 09/11/2006   UACLA CLEAR 07/21/2012   UACLA clear 09/11/2006   UAPRO NEGATIVE 07/21/2012   UAPRO negative 09/11/2006   UAGLU NEGATIVE 07/21/2012   UAGLU negative 09/11/2006   UAKET NEGATIVE 07/21/2012   UAKET negative 09/11/2006   UAOCC NEGATIVE 07/21/2012   UAOCC negative 09/11/2006   UANIT NEGATIVE 07/21/2012   UANIT negative 09/11/2006   UABIL NEGATIVE 07/21/2012   UABIL negative 09/11/2006   UAWBC FEW 3-4 02/28/2006   UARBC FEW 3-4* 02/28/2006   UASQE >10* 02/28/2006   UACRY NONE SEEN 02/28/2006   UABAC 10-50* 02/28/2006   UACAS NONE SEEN 02/28/2006         Imaging: Abdominal U/S completed this evening shows cholelithiasis with an immobile gallstone at the gallbladder neck.  Mild gallbladder wall  thickening measuring up to 4mm.  Trace pericholecystic fluid.  Negative sonographic Murphy's sound.      Impression & Recommendations: 70F with epigastric and RUQ abdominal pain, low grade temp, WBC 12.4, and U/S consistent with cholecystitis.  The patient's symptoms began over 3 days ago and currently she is pain-free and only mildly ttp. We discussed management options with her. At present she is pain free, after discussions she was discharged home on Augmentin and f/u in the Surgical  Clinic very soon, this Friday 07/23/12, with Dr. Veto Kemps.  She will return to the ED if her pain worsens, she develops fever, nausea , vomiting or jaundice .  The patient understands these instructions.  The patient was discussed with Dr. Camillo Flaming who is in agreement with the plan.    Gaylen McCann,PA-C, 07/22/2012, 1:05 AM       Pager [phone]

## 2012-07-22 NOTE — ED Notes (Signed)
Patient Disposition    Patient education for diagnosis, medications, activity, diet and follow-up.  Patient left ED 12:48 AM.  Patient received written instructions.  Interpreter to provide instructions: No    Discharged to: Discharged to home    IV removed. Medicated as noted. Reviewed discharge and prescription instructions. Patient departs on foot with steady gait.

## 2012-07-22 NOTE — Discharge Instructions (Signed)
Take the antibiotics as prescribed.  Will plan to follow-up with surgery clinic on Friday for re-evaluation of your gallbladder & abdominal pain.     Return to ED for worsening or changing symptoms such as increasing or changing pain, fevers, vomiting & inability to tolerate meds/fluids, yellowish discoloration to eyes or skin, or any other concerns.

## 2012-07-23 ENCOUNTER — Ambulatory Visit (HOSPITAL_BASED_OUTPATIENT_CLINIC_OR_DEPARTMENT_OTHER): Payer: No Typology Code available for payment source | Admitting: Surgery

## 2012-07-23 ENCOUNTER — Encounter (HOSPITAL_BASED_OUTPATIENT_CLINIC_OR_DEPARTMENT_OTHER): Payer: Self-pay | Admitting: Surgery

## 2012-07-23 ENCOUNTER — Ambulatory Visit: Payer: Self-pay | Admitting: Gastroenterology

## 2012-07-23 VITALS — BP 124/80 | HR 95 | Temp 98.2°F

## 2012-07-23 DIAGNOSIS — K819 Cholecystitis, unspecified: Secondary | ICD-10-CM

## 2012-07-23 NOTE — Progress Notes (Signed)
Surgical History and Physical    PCP: Celso Sickle, [facility]  CC: RUQ and epigastric pain.    HPI: Patient is a 42 year old  Very anxious female who presents after an ED visit on 07/21/12 for RUQ pain. Had a WBC of 12 and an ultrasound that showed multiple gallstones in the neck of the gallbladder and some fluid consistent with early cholecystitis. She was discharged on antibiotics and now comes to clinic. It is unclear why she was discharged and not admitted to the hospital.  She still has some pain in the RUQ and epigastric area.  She has gastritis as well which may be complicating things and she is worried about ulcers. Prior EGD and colonoscopy with Dr. Suzie Portela in 2010.  Also has back pain.  Pain is a little better on the left than when she was in the ED but only slightly better on the right side.  Denies fevers now. No N/V but is bloated with any food intake.  Pain is most when she moves or walks.  Has some left sided LUQ pain which is better than when in the ED.  Still taking her omeprazole.    Review of Systems: All systems reviewed and negative except for HPI above.  Positive for diarrhea since starting the antibiotics.      Past Medical History    ACUTE GASTRITIS W/O HEMORRHAGE 06/12/2004    Comment: H. Pylori positive (done at [facility]); GI consult Dr. Sharrie Rothman, TOC 10/01/04    CLOSTRIDIUM DIFFICILE 07/26/2004    Comment: GI cosult: Dr. Suzie Portela, TOC done on 10/01/04    ALLERGY, UNSPECIFIED 03/26/2004    Comment: Seen by ENT, deviated septum, hypertrophy of inferior nasal turbinates    Esophageal reflux          Past Surgical History    NO SIGNIFICANT SURGICAL HISTORY         Social History:     Smoking status: Never Smoker     Smokeless tobacco: Not on file    Alcohol Use: No     Family Hx:    Family History    Diabetes Mother     Hypertension Mother     Lipids Mother     Hypertension Father        (Not in a hospital admission)  Review of Patient's Allergies indicates:   Erythromycin             Itching      VITALS: BP 124/80  Pulse 95  Temp(Src) 98.2 F (36.8 C)  LMP 06/20/2012      PE:  GEN: Appears well, obese, pleasant, anxious, no jaundice, NAD.  HEENT:PERRL  HRT: RRR  LUNGS:CTA B  KYH:CWCBJS tender in the RUQ and epigastric area without masses, no rebound or peritoneal signs. Rest abdomen is benign.  EXT:no edema    LABS:  Component Value Date   WBC 12.6* 07/21/2012   HGB 11.9 07/21/2012   HCT 37.7 07/21/2012     Component Value Date   NA 138 07/21/2012   K 3.8 07/21/2012   CL 99 07/21/2012   CO2 29 07/21/2012   BUN 10 07/21/2012   CREAT 0.7 07/21/2012   TBILI 0.4 07/21/2012   AST 14 07/21/2012   ALT 23 07/21/2012   ALKPHOS 98 07/21/2012     Component Value Date   PT 10.0 04/28/2008   INR 1.0* 04/28/2008   APTT 27.5 04/28/2008       IMAGING: Ultrasound with multiple gallstones one  in the neck of the gallbladder and gallbladder wall thickening consistent with early cholecystitis.    A/P: Patient is a 42 year old female with likely resolving acute cholecystitis. Now has had pain for 4 days and lap chole not possible.  Will continue antibiotics and follow. If not getting better over the weekend then will need perc chole tube next week. If getting better then should finish the antibiotics and with plan on follow up in 2-3 weeks. Delayed lap chole once infection resolved.    Written and verbal info about the surgery given.  Gastritis may play a part but I do not think is her main problem right now. She asked about repeat EGD but I do not think this is indicated she still has significant symptoms months after lap chole.    All questions answered.

## 2012-07-26 LAB — HELICOBACTER PYLORI IGM: HELICOBACTER PYLORI IGM: 4.8 units (ref 0.0–8.9)

## 2012-07-28 LAB — HELICOBACTER PYLORI IGG: HELICOBACTER PYLORI IGG: NEGATIVE

## 2012-07-28 LAB — COMPREHENSIVE METABOLIC PANEL
ALANINE AMINOTRANSFERASE: 23 U/L (ref 12–45)
ALBUMIN: 3.5 g/dL (ref 3.4–5.0)
ALKALINE PHOSPHATASE: 98 U/L (ref 45–117)
ANION GAP: 10 mmol/L (ref 5–15)
ASPARTATE AMINOTRANSFERASE: 14 U/L (ref 8–34)
BILIRUBIN TOTAL: 0.4 mg/dL (ref 0.2–1.0)
BUN (UREA NITROGEN): 10 mg/dL (ref 7–18)
CALCIUM: 9.5 mg/dL (ref 8.5–10.1)
CARBON DIOXIDE: 29 mmol/L (ref 21–32)
CHLORIDE: 99 mmol/L (ref 98–107)
CREATININE: 0.7 mg/dL (ref 0.4–1.2)
ESTIMATED GLOMERULAR FILT RATE: >60 mL/min (ref >60)
Glucose Random: 86 mg/dL (ref 74–160)
POTASSIUM: 3.8 mmol/L (ref 3.5–5.1)
SODIUM: 138 mmol/L (ref 136–145)
TOTAL PROTEIN: 7.2 g/dL (ref 6.4–8.2)

## 2012-07-28 LAB — LIPASE: LIPASE: 90 U/L (ref 73–393)

## 2012-08-02 ENCOUNTER — Encounter (HOSPITAL_BASED_OUTPATIENT_CLINIC_OR_DEPARTMENT_OTHER): Payer: Self-pay | Admitting: Gastroenterology

## 2012-08-02 ENCOUNTER — Ambulatory Visit (HOSPITAL_BASED_OUTPATIENT_CLINIC_OR_DEPARTMENT_OTHER): Payer: No Typology Code available for payment source | Admitting: Gastroenterology

## 2012-08-02 VITALS — BP 114/78 | HR 83 | Wt 272.0 lb

## 2012-08-02 DIAGNOSIS — K219 Gastro-esophageal reflux disease without esophagitis: Secondary | ICD-10-CM

## 2012-08-02 DIAGNOSIS — K819 Cholecystitis, unspecified: Secondary | ICD-10-CM

## 2012-08-02 NOTE — Progress Notes (Signed)
I, Dr. Merwyn Katos, personally interviewed, examined and reviewed the chart of this patient and formulated the plan.  Patient was seen together with Dr. Irven Shelling, please see her note from today's visit for the complete evaluation.      She is doing better. She still has some pain. She is awaiting her cholecystectomy. Her exam is benign, except for some RUQ tenderness on deep palpation.    Patient pending cholecystectomy. Will follow up with [name] after surgery. Patient is loosing weight. Over 25 minutes spent with the patient, more than half of which were in counseling.

## 2012-08-02 NOTE — Progress Notes (Signed)
Patient feels safe at home.

## 2012-08-02 NOTE — Progress Notes (Signed)
CC: follow up abdominal pain    HPI: Pt with hx of morbid obesity, GERD and abdominal pain.  Had abdominal ultrasound which was consistent with early cholecystitis.  Evaluated recently by surgical team, treated with antibiotics and plan is for her to undergo CCY by the end of this month.  Pt feels better.  Still with RUQ pain exacerbated by food.  Is happy to share that she has voluntarily lost 3lbs since her last visit.      Denies n/v, melena, hematochezia, diarrhea, constipation, dysphagia, dysuria, fevers.     Allergies: Erythromycin    Current Medications:    Current Outpatient Prescriptions on File Prior to Visit:  [EXPIRED] amoxicillin-clavulanate (AUGMENTIN) 875-125 MG per tablet Take 1 tablet by mouth 2 (two) times daily. for 10 days Disp: 20 tablet Rfl: 0   omeprazole (PRILOSEC) 40 MG capsule Take 40 mg by mouth daily. Disp:  Rfl:    Cetirizine HCl (ZYRTEC PO)  Disp:  Rfl:    ibuprofen (MOTRIN) 800 MG tablet Take 1 tablet by mouth every 8 (eight) hours as needed for Pain. with food or milk Disp: 20 tablet Rfl: 0   cetirizine (ZYRTEC) 10 MG tablet Take 10 mg by mouth daily. Disp:  Rfl:    MECLIZINE HCL 25 MG OR TABS 1 TABLET 3 TIMES DAILY AS NEEDED Disp: 60 Rfl: 0     No current facility-administered medications on file prior to visit.    Physical Exam:  BP 114/78   Pulse 83   Wt 272 lb (123.378 kg)   BMI 46.67 kg/m2   SpO2 98%   LMP 06/20/2012  General: very pleasant, but anxious woman in no acute distress.  Cardio: RRR, no murmurs  Lungs: CTAB  Abdomen: obese, tender to palpation in RUQ area, no rebound or guarding.  NBS.  Extremities: warm, no LE edema.    Recent Labs:  CBC on 07/21/12: remarkable for WBC 12.6  CMP on 07/21/12: unremarkable    Imaging:  Abdominal US on 07/21/2012:  Impression:   1. Multiple gallstones including one impacted in the neck. Minimal   gallbladder wall thickening and trace pericholecystic of fluid.   Findings suggestive of early acute cholecystitis. However sonographic    Murphy's sign was reported as absent, though patient was medicated.   Correlate clinically and with HIDA scan as indicated.   2. Hepatic steatosis.       Assessment and Plan:    (575.10) Cholecystitis  (primary encounter diagnosis)  Comment/Plan: as evidenced by recent RUQ [name] and clinical presentation.  Surgery following.  Pt to have CCY by end of this month.  Provided reassurance, as pt anxious over surgery.  Encouraged pt to continue weight loss via diet and exercise, avoid fatty foods.  Pt expressed understanding and agrees with plan.      (530.81) GERD (gastroesophageal reflux disease)  Comment/Plan: continue PPI, diet control, HOB elevation, weight loss as above.      Follow Up: 3 months with Dr Suzie Portela    Irven Shelling, MD

## 2012-08-12 ENCOUNTER — Ambulatory Visit (HOSPITAL_BASED_OUTPATIENT_CLINIC_OR_DEPARTMENT_OTHER): Payer: No Typology Code available for payment source | Admitting: Surgery

## 2012-08-26 ENCOUNTER — Encounter (HOSPITAL_BASED_OUTPATIENT_CLINIC_OR_DEPARTMENT_OTHER): Payer: Self-pay | Admitting: Surgery

## 2012-08-26 ENCOUNTER — Ambulatory Visit (HOSPITAL_BASED_OUTPATIENT_CLINIC_OR_DEPARTMENT_OTHER): Payer: No Typology Code available for payment source | Admitting: Surgery

## 2012-08-26 DIAGNOSIS — K819 Cholecystitis, unspecified: Secondary | ICD-10-CM

## 2012-08-26 NOTE — Progress Notes (Signed)
Follows up for cholecystitis treated with antibiotics. Awaiting lap chole.  She got more pain and has needed another course of antibiotics.    PE: appears well, no jaundice.  Abd obese but soft, mild RUQ tenderness to deep palpation, no masses.    A/P: Ready for lap chole in another 2 weeks.  We discussed the details of a laparoscopic cholecystectomy including the risks of bleeding, infection, bile leak, and injury to the common bile duct. We discussed the details of recovery and diet. Written and verbal information was given and questions answered.  This may be a more difficult surgery because of the cholecystitis. Discussed possibility of open surgery.  Consent obtained. All questions answered.  Check labs pre-op.  15 minutes spent over half in discussion.

## 2012-08-27 ENCOUNTER — Encounter (HOSPITAL_BASED_OUTPATIENT_CLINIC_OR_DEPARTMENT_OTHER): Payer: Self-pay | Admitting: Surgery

## 2012-09-10 ENCOUNTER — Ambulatory Visit (HOSPITAL_BASED_OUTPATIENT_CLINIC_OR_DEPARTMENT_OTHER): Payer: No Typology Code available for payment source | Admitting: Surgery

## 2012-09-13 ENCOUNTER — Ambulatory Visit (HOSPITAL_BASED_OUTPATIENT_CLINIC_OR_DEPARTMENT_OTHER)
Admit: 2012-09-13 | Discharge: 2012-09-13 | Disposition: A | Discharge status: Home / Self Care | Payer: Self-pay | Source: Ambulatory Visit | Attending: Surgery | Admitting: Surgery

## 2012-09-13 ENCOUNTER — Encounter (HOSPITAL_BASED_OUTPATIENT_CLINIC_OR_DEPARTMENT_OTHER): Payer: Self-pay | Admitting: Surgery

## 2012-09-13 HISTORY — DX: Anxiety disorder, unspecified: F41.9

## 2012-09-13 HISTORY — DX: Unspecified asthma, uncomplicated: J45.909

## 2012-09-13 NOTE — Discharge Instructions (Signed)
SURGICAL DAY CARE   PRE-OPERATIVE INSTRUCTIONS    Alejandra Martin  [id number]  ..................................................................................................................Marland Kitchen     Arrive at the Admitting Office at  10:00 o'clock on 09/20/12 at: [facility].     You may have nothing at all to eat or drink after midnight the night before the surgery, not even water, gum or hard candy.     You may not smoke the morning of your surgery.     Please leave all valuables at home, including jewelry, watches, money, etc.     Women must remove all fingernail polish before arriving at Surgical Day Care and must not wear any face or lip make -up.     Do not shave surgical site.     Do not wear contact lenses.      You must have a responsible adult available to accompany you home after surgery.  (We suggest that you have someone available to assist you at home after surgery.     Take the following medication the night before surgery at bedtime:  None  (omeprazole if needed)           Take the following medication the morning of your surgery with only a sip or water:  Usual medicines   Rivka Spring, MD, 09/13/2012, 1:18 PM

## 2012-09-13 NOTE — H&P (Signed)
Pre-Anesthetic Note    Pre op Diagnosis: cholecystitis    Planned Procedure: lap chole    Patient ID:  Alejandra Martin is a 42 year old female  Height: 5\' 3"  (160 cm)  Weight: 270 lb (122.471 kg)    Previous Anesthetic History:       Past Surgical History    NO SIGNIFICANT SURGICAL HISTORY      OB ANTEPARTUM CARE CESAREAN DLVR & POSTPARTUM       Patient denies complications of anesthesia.  Patient denies family complications of anesthesia.    Current Medications:      Current Outpatient Prescriptions on File Prior to Encounter:  omeprazole (PRILOSEC) 40 MG capsule Take 40 mg by mouth daily. Disp:  Rfl:    cetirizine (ZYRTEC) 10 MG tablet Take 10 mg by mouth daily. Disp:  Rfl:    Cetirizine HCl (ZYRTEC PO)  Disp:  Rfl:    ibuprofen (MOTRIN) 800 MG tablet Take 1 tablet by mouth every 8 (eight) hours as needed for Pain. with food or milk Disp: 20 tablet Rfl: 0   MECLIZINE HCL 25 MG OR TABS 1 TABLET 3 TIMES DAILY AS NEEDED Disp: 60 Rfl: 0     No current facility-administered medications on file prior to encounter.      Allergies:   Review of Patient's Allergies indicates:   Erythromycin            Itching    Smoking, Alcohol, Drugs:     Smoking status: Never Smoker     Smokeless tobacco: Never Used    Alcohol Use: No       Drug Use: No       PMHx:    Past Medical History    ACUTE GASTRITIS W/O HEMORRHAGE 06/12/2004    Comment: H. Pylori positive (done at [facility]); GI consult Dr. Sharrie Rothman, TOC 10/01/04    CLOSTRIDIUM DIFFICILE 07/26/2004    Comment: GI cosult: Dr. Suzie Portela, TOC done on 10/01/04    ALLERGY, UNSPECIFIED 03/26/2004    Comment: Seen by ENT, deviated septum, hypertrophy of inferior nasal turbinates    Esophageal reflux     Unspecified asthma(493.90)     Anxiety        PHYSICAL EXAMINATION:  BP 124/93   Pulse 90   Temp(Src) 98.7 F (37.1 C) (Temporal)   Resp 20   Ht 5\' 3"  (1.6 m)   Wt 270 lb (122.471 kg)   BMI 47.84 kg/m2   SpO2 99%   LMP 08/13/2012    General: overweight    Airway Classification:  Teeth:   Intact, none loose  Mallampati: Class II   The same as Class I except the tonsilar pillars are hidden by the tounge.   Mallampati Airway Classification:   Oral Opening:  3cm  Full range of neck Motion:  Yes     Lungs: clear to auscultation bilaterally    Heart: normal and regular rate and rhythm    EKG:  2010  Vent. Rate : 086 BPM Atrial Rate : 086 BPM  P-R Int : 124 ms QRS Dur : 072 ms  QT Int : 350 ms P-R-T Axes : 025 012 038 degrees  QTc Int : 418 ms    Normal sinus rhythm  Low voltage QRS  Borderline ECG  No previous ECGs available    Referred By: Era Skeen Overread By: Benjaman Pott MD      Stress test 2010:  Baseline electrocardiogram was normal. With exercise, the   electrocardiogram  did not show ST-segment changes. There were no   arrhythmias noted during exercise.  Impression:  1. Functional capacity (NEJM 2005:353: 468) is poor and estimated at 60%   predicted for age and sex 2. Blood Pressure Response: Normal  3. Exercise Treadmill ECG: Negative for ischemia  4. Arrhythmias: none  5. Duke Criteria Risk Score: Low risk (score +5)          Pertinent Labs:   Component Value Date   NA 138 07/21/2012   K 3.8 07/21/2012   CREAT 0.7 07/21/2012   GLUCOSER 86 07/21/2012   WBC 12.6* 07/21/2012   HCT 37.7 07/21/2012   PLTA 266 07/21/2012   PT 10.0 04/28/2008   APTT 27.5 04/28/2008   INR 1.0* 04/28/2008        Other Findings: home with a 42 yr old.  Cleans, does errands.  Can get SOB after 10 minutes of walking.    ASA Assessment: II   A Patient With Mild Systemic Disease  Emergency:  No   Potential Anesthesia Problems:  No    Plan:  GETA    Rivka Spring, MD   Pager: [phone]          Monica Martinez = Mandatory Field.  For non-mandatory fields, the provider will enter relevant positive findings.

## 2012-09-20 ENCOUNTER — Ambulatory Visit (HOSPITAL_BASED_OUTPATIENT_CLINIC_OR_DEPARTMENT_OTHER)
Admit: 2012-09-20 | Disposition: A | Discharge status: Home / Self Care | Payer: Self-pay | Source: Ambulatory Visit | Attending: Surgery | Admitting: Surgery

## 2012-09-20 ENCOUNTER — Encounter (HOSPITAL_BASED_OUTPATIENT_CLINIC_OR_DEPARTMENT_OTHER): Payer: Self-pay | Admitting: Surgery

## 2012-09-20 LAB — URINE PREGNANCY TEST (POINT OF CARE): HCG QUALITATIVE URINE: NEGATIVE

## 2012-09-20 MED ORDER — ONDANSETRON HCL 4 MG/2ML IJ SOLN
4.0000 mg | Freq: Once | INTRAMUSCULAR | Status: DC | PRN
Start: 2012-09-20 — End: 2012-09-20

## 2012-09-20 MED ORDER — CHLORHEXIDINE GLUCONATE CLOTH 2 % EX PADS
MEDICATED_PAD | Freq: Every day | CUTANEOUS | Status: DC
Start: 2012-09-21 — End: 2012-09-21

## 2012-09-20 MED ORDER — NEOSTIGMINE METHYLSULFATE 1 MG/ML IJ SOLN
INTRAMUSCULAR | Status: AC
Start: 2012-09-20 — End: 2012-09-20
  Filled 2012-09-20: qty 10

## 2012-09-20 MED ORDER — HYDROMORPHONE HCL PF 1 MG/ML IJ SOLN
0.2500 mg | INTRAMUSCULAR | Status: DC | PRN
Start: 2012-09-20 — End: 2012-09-21

## 2012-09-20 MED ORDER — MIDAZOLAM HCL 2 MG/2ML IJ SOLN
INTRAMUSCULAR | Status: AC
Start: 2012-09-20 — End: 2012-09-20
  Filled 2012-09-20: qty 2

## 2012-09-20 MED ORDER — BUPIVACAINE HCL (PF) 0.5 % IJ SOLN
INTRAMUSCULAR | Status: AC
Start: 2012-09-20 — End: 2012-09-20
  Administered 2012-09-20: 5 mL via INTRAMUSCULAR
  Administered 2012-09-20: 11 mL via INTRAMUSCULAR
  Filled 2012-09-20: qty 30

## 2012-09-20 MED ORDER — CEFAZOLIN SODIUM 1 G IJ SOLR
3.0000 g | Freq: Once | INTRAMUSCULAR | Status: AC
Start: 2012-09-20 — End: 2012-09-20
  Administered 2012-09-20: 3 g via INTRAVENOUS

## 2012-09-20 MED ORDER — LORATADINE 10 MG PO TABS
10.0000 mg | ORAL_TABLET | Freq: Every day | ORAL | Status: DC
Start: 2012-09-20 — End: 2012-09-21
  Administered 2012-09-21: 10 mg via ORAL
  Filled 2012-09-20: qty 1

## 2012-09-20 MED ORDER — ROCURONIUM BROMIDE 100 MG/10ML IV SOLN
INTRAVENOUS | Status: AC
Start: 2012-09-20 — End: 2012-09-20
  Filled 2012-09-20: qty 10

## 2012-09-20 MED ORDER — MORPHINE SULFATE (PF) 2 MG/ML IV SOLN
2.0000 mg | INTRAVENOUS | Status: DC | PRN
Start: 2012-09-20 — End: 2012-09-20

## 2012-09-20 MED ORDER — MASTISOL ADHESIVE EX LIQD
CUTANEOUS | Status: AC
Start: 2012-09-20 — End: 2012-09-20
  Administered 2012-09-20: 1 via TOPICAL
  Filled 2012-09-20: qty 1

## 2012-09-20 MED ORDER — HYDROMORPHONE HCL PF 1 MG/ML IJ SOLN
INTRAMUSCULAR | Status: AC
Start: 2012-09-20 — End: 2012-09-20
  Filled 2012-09-20: qty 1

## 2012-09-20 MED ORDER — KETOROLAC TROMETHAMINE 30 MG/ML IJ SOLN
15.0000 mg | Freq: Four times a day (QID) | INTRAMUSCULAR | Status: DC
Start: 2012-09-20 — End: 2012-09-21
  Administered 2012-09-20 (×2): 15 mg via INTRAVENOUS
  Filled 2012-09-20 (×3): qty 1

## 2012-09-20 MED ORDER — ONDANSETRON HCL 4 MG/2ML IJ SOLN
INTRAMUSCULAR | Status: AC
Start: 2012-09-20 — End: 2012-09-20
  Filled 2012-09-20: qty 2

## 2012-09-20 MED ORDER — LACTATED RINGERS IV SOLN
INTRAVENOUS | Status: DC
Start: 2012-09-20 — End: 2012-09-20

## 2012-09-20 MED ORDER — PANTOPRAZOLE SODIUM 40 MG PO TBEC
40.00 mg | DELAYED_RELEASE_TABLET | Freq: Once | ORAL | Status: AC
Start: 2012-09-20 — End: 2012-09-20
  Administered 2012-09-20: 40 mg via ORAL

## 2012-09-20 MED ORDER — FENTANYL CITRATE 0.05 MG/ML IJ SOLN
INTRAMUSCULAR | Status: AC
Start: 2012-09-20 — End: 2012-09-20
  Filled 2012-09-20: qty 2

## 2012-09-20 MED ORDER — METOCLOPRAMIDE HCL 5 MG/ML IJ SOLN
10.0000 mg | Freq: Once | INTRAMUSCULAR | Status: DC | PRN
Start: 2012-09-20 — End: 2012-09-20

## 2012-09-20 MED ORDER — GLYCOPYRROLATE 0.4 MG/2ML IJ SOLN
INTRAMUSCULAR | Status: AC
Start: 2012-09-20 — End: 2012-09-20
  Filled 2012-09-20: qty 6

## 2012-09-20 MED ORDER — OXYCODONE-ACETAMINOPHEN 5-325 MG PO TABS
1.0000 | ORAL_TABLET | ORAL | Status: DC | PRN
Start: 2012-09-20 — End: 2012-09-21

## 2012-09-20 MED ORDER — ALBUTEROL SULFATE HFA 108 (90 BASE) MCG/ACT IN AERS
2.0000 | INHALATION_SPRAY | Freq: Two times a day (BID) | RESPIRATORY_TRACT | Status: DC
Start: 2012-09-20 — End: 2012-09-21
  Administered 2012-09-21: 2 via RESPIRATORY_TRACT
  Filled 2012-09-20: qty 8

## 2012-09-20 MED ORDER — PROPOFOL 10 MG/ML IV EMUL
INTRAVENOUS | Status: AC
Start: 2012-09-20 — End: 2012-09-20
  Filled 2012-09-20: qty 20

## 2012-09-20 MED ORDER — CEFAZOLIN SODIUM 1 G IJ SOLR
INTRAMUSCULAR | Status: AC
Start: 2012-09-20 — End: 2012-09-20
  Filled 2012-09-20: qty 3000

## 2012-09-20 MED ORDER — DOCUSATE SODIUM 100 MG PO CAPS
100.0000 mg | ORAL_CAPSULE | Freq: Two times a day (BID) | ORAL | Status: DC
Start: 2012-09-20 — End: 2012-09-21
  Administered 2012-09-20 – 2012-09-21 (×2): 100 mg via ORAL
  Filled 2012-09-20 (×2): qty 1

## 2012-09-20 MED ORDER — ONDANSETRON HCL 4 MG/2ML IJ SOLN
4.0000 mg | Freq: Three times a day (TID) | INTRAMUSCULAR | Status: DC | PRN
Start: 2012-09-20 — End: 2012-09-21

## 2012-09-20 MED ORDER — HEPARIN SODIUM (PORCINE) 5000 UNIT/ML IJ SOLN
5000.0000 [IU] | Freq: Three times a day (TID) | INTRAMUSCULAR | Status: DC
Start: 2012-09-20 — End: 2012-09-21
  Administered 2012-09-20 – 2012-09-21 (×2): 5000 [IU] via SUBCUTANEOUS
  Filled 2012-09-20 (×2): qty 1

## 2012-09-20 MED ORDER — HYDROMORPHONE HCL PF 1 MG/ML IJ SOLN
0.2500 mg | INTRAMUSCULAR | Status: DC | PRN
Start: 2012-09-20 — End: 2012-09-20

## 2012-09-20 MED ORDER — LACTATED RINGERS IV SOLN
INTRAVENOUS | Status: DC
Start: 2012-09-20 — End: 2012-09-20
  Administered 2012-09-20: 11:00:00 via INTRAVENOUS

## 2012-09-20 MED ORDER — KETOROLAC TROMETHAMINE 30 MG/ML IJ SOLN
INTRAMUSCULAR | Status: AC
Start: 2012-09-20 — End: 2012-09-20
  Filled 2012-09-20: qty 1

## 2012-09-20 MED ORDER — HYDROMORPHONE HCL PF 1 MG/ML IJ SOLN
0.5000 mg | INTRAMUSCULAR | Status: DC | PRN
Start: 2012-09-20 — End: 2012-09-21

## 2012-09-20 MED ORDER — DEXAMETHASONE SODIUM PHOSPHATE 4 MG/ML IJ SOLN
4.0000 mg | INTRAMUSCULAR | Status: DC | PRN
Start: 2012-09-20 — End: 2012-09-20

## 2012-09-20 MED ORDER — OXYCODONE-ACETAMINOPHEN 5-325 MG PO TABS
2.0000 | ORAL_TABLET | ORAL | Status: DC | PRN
Start: 2012-09-20 — End: 2012-09-21

## 2012-09-20 MED ORDER — PANTOPRAZOLE SODIUM 40 MG PO TBEC
40.0000 mg | DELAYED_RELEASE_TABLET | Freq: Every day | ORAL | Status: DC
Start: 2012-09-20 — End: 2012-09-21
  Administered 2012-09-21: 40 mg via ORAL
  Filled 2012-09-20 (×2): qty 1

## 2012-09-20 NOTE — Surgery Post-Op (Signed)
Surgery Progress Note    Post-op day #: 0     S/P: lap CCY    Reason for Hospitalization: Acute-on-chronic cholecystitis.    Subjective: Awake, sitting comfortably. Minimal pain, tolerating sips of clears. Minimal ambulation thus far. Complaining of mild burning mid-epigastric sensation.    Objective:  Vitals:  BP 125/78   Pulse 91   Temp(Src) 97.6 F (36.4 C) (Temporal)   Resp 16   Ht 5\' 3"  (1.6 m)   Wt 270 lb (122.471 kg)   BMI 47.84 kg/m2   SpO2 97%   LMP 08/13/2012  Temp:  [97.6 F (36.4 C)-98.8 F (37.1 C)] 97.6 F (36.4 C)  Pulse:  [79-96] 91  Resp:  [8-20] 16  BP: (125-143)/(70-92) 125/78 mmHg    Ins/Outs:  I/O 24 Hrs:  In: 1000 [I.V.:1000]  Out: -        Physical Exam:  GEN: A&O x3, NAD  CV: RRR, nl S1/S2  PULM: Normal respiratory effort, CTAB  ABD: Soft, mildly distended, appropriately tender, dressings c/d/i  EXT: MAE, WWP    LABS:  Component Value Date   NA 138 07/21/2012   K 3.8 07/21/2012   CL 99 07/21/2012   CO2 29 07/21/2012   BUN 10 07/21/2012   CREAT 0.7 07/21/2012   GLUCOSER 86 07/21/2012   TBILI 0.4 07/21/2012   AST 14 07/21/2012   ALT 23 07/21/2012   ALKPHOS 98 07/21/2012     Component Value Date   WBC 12.6* 07/21/2012   HGB 11.9 07/21/2012   HCT 37.7 07/21/2012   PLTA 266 07/21/2012       Assessment/Plan:  27F POD #0 s/p elective lap CCY for acute-on-chronic cholecystitis, doing well.  --Diet: Clear liquids, ADAT and HLIV when tolerating PO  --Pain: Toradol, Percocet, Dilaudid prn  --Ambulate, SCDs, SQH  --Mid-epigastric burning sensation: Likely 2/2 insufflation, continue to monitor. Restarted home Protonix  --Dispo: Likely home 8/5 AM    Lakie Mclouth E. Maple Hudson, MD  Pager #: [phone]

## 2012-09-20 NOTE — H&P (View-Only) (Signed)
Pre-Anesthetic Note    Pre op Diagnosis: cholecystitis    Planned Procedure: lap chole    Patient ID:  Alejandra Martin is a 42 year old female  Height: 5' 3" (160 cm)  Weight: 270 lb (122.471 kg)    Previous Anesthetic History:       Past Surgical History    NO SIGNIFICANT SURGICAL HISTORY      OB ANTEPARTUM CARE CESAREAN DLVR & POSTPARTUM       Patient denies complications of anesthesia.  Patient denies family complications of anesthesia.    Current Medications:      Current Outpatient Prescriptions on File Prior to Encounter:  omeprazole (PRILOSEC) 40 MG capsule Take 40 mg by mouth daily. Disp:  Rfl:    cetirizine (ZYRTEC) 10 MG tablet Take 10 mg by mouth daily. Disp:  Rfl:    Cetirizine HCl (ZYRTEC PO)  Disp:  Rfl:    ibuprofen (MOTRIN) 800 MG tablet Take 1 tablet by mouth every 8 (eight) hours as needed for Pain. with food or milk Disp: 20 tablet Rfl: 0   MECLIZINE HCL 25 MG OR TABS 1 TABLET 3 TIMES DAILY AS NEEDED Disp: 60 Rfl: 0     No current facility-administered medications on file prior to encounter.      Allergies:   Review of Patient's Allergies indicates:   Erythromycin            Itching    Smoking, Alcohol, Drugs:     Smoking status: Never Smoker     Smokeless tobacco: Never Used    Alcohol Use: No       Drug Use: No       PMHx:    Past Medical History    ACUTE GASTRITIS W/O HEMORRHAGE 06/12/2004    Comment: H. Pylori positive (done at [facility]); GI consult Dr. Micheal Payne, TOC 10/01/04    CLOSTRIDIUM DIFFICILE 07/26/2004    Comment: GI cosult: Dr. Payne, TOC done on 10/01/04    ALLERGY, UNSPECIFIED 03/26/2004    Comment: Seen by ENT, deviated septum, hypertrophy of inferior nasal turbinates    Esophageal reflux     Unspecified asthma(493.90)     Anxiety        PHYSICAL EXAMINATION:  BP 124/93   Pulse 90   Temp(Src) 98.7 °F (37.1 °C) (Temporal)   Resp 20   Ht 5' 3" (1.6 m)   Wt 270 lb (122.471 kg)   BMI 47.84 kg/m2   SpO2 99%   LMP 08/13/2012    General: overweight    Airway Classification:  Teeth:   Intact, none loose  Mallampati: Class II   The same as Class I except the tonsilar pillars are hidden by the tounge.   Mallampati Airway Classification:   Oral Opening:  3cm  Full range of neck Motion:  Yes     Lungs: clear to auscultation bilaterally    Heart: normal and regular rate and rhythm    EKG:  2010  Vent. Rate : 086 BPM Atrial Rate : 086 BPM  P-R Int : 124 ms QRS Dur : 072 ms  QT Int : 350 ms P-R-T Axes : 025 012 038 degrees  QTc Int : 418 ms    Normal sinus rhythm  Low voltage QRS  Borderline ECG  No previous ECGs available    Referred By: GARY BLAIR Overread By: SALIM JABBOUR MD      Stress test 2010:  Baseline electrocardiogram was normal. With exercise, the   electrocardiogram   did not show ST-segment changes. There were no   arrhythmias noted during exercise.  Impression:  1. Functional capacity (NEJM 2005:353: 468) is poor and estimated at 60%   predicted for age and sex 2. Blood Pressure Response: Normal  3. Exercise Treadmill ECG: Negative for ischemia  4. Arrhythmias: none  5. Duke Criteria Risk Score: Low risk (score +5)          Pertinent Labs:   Component Value Date   NA 138 07/21/2012   K 3.8 07/21/2012   CREAT 0.7 07/21/2012   GLUCOSER 86 07/21/2012   WBC 12.6* 07/21/2012   HCT 37.7 07/21/2012   PLTA 266 07/21/2012   PT 10.0 04/28/2008   APTT 27.5 04/28/2008   INR 1.0* 04/28/2008        Other Findings: home with a 42 yr old.  Cleans, does errands.  Can get SOB after 10 minutes of walking.    ASA Assessment: II   A Patient With Mild Systemic Disease  Emergency:  No   Potential Anesthesia Problems:  No    Plan:  GETA    Garret Teale S Yesenia Locurto, MD   Pager: [phone]          Bold Font = Mandatory Field.  For non-mandatory fields, the provider will enter relevant positive findings.

## 2012-09-20 NOTE — Op Note (Signed)
Date of Operation: 09/20/2012    PREOPERATIVE DIAGNOSES:  Acute cholecystitis and cholelithiasis.    POSTOPERATIVE DIAGNOSES:  Acute-on-chronic cholecystitis and cholelithiasis, umbilical hernia.    PROCEDURES PERFORMED:  Laparoscopic cholecystectomy and umbilical hernia repair.    SURGEON:  Jonny Ruiz, MD.    ASSISTANT:  Francie Massing, MD.    ANESTHESIA:  General endotracheal and local.    INDICATIONS FOR PROCEDURE:  This is a 42 year old woman who had been admitted to the hospital with acute cholecystitis.  She was treated with antibiotics because of the length of time of her pain and recovered at home from the acute episode.  She was then seen and evaluated in the clinic, and the risks and benefits of a laparoscopic cholecystectomy were discussed with her, and she wished to proceed.    OPERATIVE FINDINGS:  1.  Persistently edematous gallbladder filled with stones and purulent material, consistent with acute on chronic cholecystitis.  2.  Fatty but otherwise normal-appearing liver and normal visible bowel and omentum.    NARRATIVE DESCRIPTION:  After obtaining the proper consent and routine identification of the patient, she was brought to the operating room and given preoperative antibiotics, and Venodynes were placed on both lower extremities.  The anesthesia checklist was completed, and she was then placed under general endotracheal anesthesia without difficulty.  She was placed in the supine position, and the anterior abdomen was prepped and draped in the usual sterile fashion.  After the final surgical timeout, a 2-cm incision was made in a curvilinear fashion inferior to the umbilicus.  She had a preexisting umbilical hernia.  The tissue was dissected from around the umbilical hernia sac circumferentially, and then it was separated off of the skin from the underside of the umbilicus.  The hernia sac was opened and found to contain preperitoneal fat and some omentum.  The defect was about 8 mm in  diameter, and this was enlarged enough to accommodate a 12-mm balloon port.  The peritoneal cavity was insufflated to a maximum of 15 mmHg.    The liver appeared fatty.  There was some omentum adherent to the gallbladder, which was peeled down.  The gallbladder itself was very distended and appeared edematous, consistent with acute on chronic cholecystitis; 120 mL of grainy bile and some purulence were drained from the gallbladder using an aspiration needle.  This allowed the gallbladder to be decompressed enough to grasp it and retract it anteriorly.  The gallbladder wall was somewhat edematous.  The peritoneum was stripped off of the gallbladder down around the infundibulum, and dissection continued until Calot's triangle was defined.  The cystic duct was identified, and there were stones stuck in the infundibulum, but the cystic duct itself was normal.  Two clips were placed on the patient side of the duct and 1 clip on the gallbladder side, and it was divided between the clips.    This allowed further dissection back into Calot's triangle.  The cystic artery was identified, as the critical view of safety was further delineated.  There were 2 branches of the cystic artery going into the gallbladder, and both of these branches were clipped with 2 clips on the patient's side and 1 clip on the gallbladder side and then divided between the clips.    The gallbladder was then dissected up out of the liver bed using hooked cautery.  It did appear to be acute on chronic cholecystitis.  There was a very small spillage of some purulent bile.  Once the gallbladder  was completely separated from the liver bed, it was placed in an Endocatch bag.    We achieved hemostasis in the liver bed using hooked cautery as needed.  The hemostasis was excellent.  The clips were in place.    The gallbladder was then brought out through the umbilical port site in an Endocatch bag.  The gallbladder was very large and contained multiple  stones, and in order to get it out, we had to enlarge the umbilical defect another centimeter.  Gallbladder was then passed off the table and sent to pathology.    We lavaged over and under the liver and removed the trocars, and hemostasis was excellent at the trocar sites.  The peritoneal cavity was desufflated, and the umbilical hernia defect was closed in a vertical fashion using 3 figure-of-eight 0 Vicryl sutures.  The contour of the umbilicus was recreated using a tacking suture of 3-0 Vicryl at the base of the umbilical skin.  The skin incisions were then lavaged with saline and were then closed using subcuticular Monocryl sutures.  Sterile Steri-Strip, gauze, and Tegaderm dressings were placed.  The patient was awoken from anesthesia and extubated in the operating room without difficulty.  She was brought to the recovery room in good condition.  There were no complications.  Dr. Veto Kemps was present for the entire procedure.    CC:  Dr. Hilaria Ota at the [facility]    ___________________________  Reviewed and Electronically Signed By: Jonny Ruiz MD  Sig Date: 09/21/2012  Sig Time: 11:30:43  Dictated By: Jonny Ruiz MD  Dict Date: 09/20/2012 Dict Time: 01 10 PM    Dictation Date and Time:09/20/2012 13:10:56  Transcription Date and Time:09/20/2012 13:39:39  eScription Dictation id: [id number] Confirmation # :[id number]

## 2012-09-20 NOTE — Brief Op Note (Addendum)
Brief Procedure or Operative Note    Procedure: Laparoscopic cholecystectomy, umbilical hernia repair    Pre-Procedure Diagnosis:   Acute on chronic cholecystitis    Post-Procedure Diagnosis:  Same, umbilical hernia    Surgeon: Jonny Ruiz, MD    Assistant:  Other: Francie Massing, MD    Type of Anesthesia:   General = General Anesthesia    Findings:   Acute on chronic cholecystitis, umbilical hernia    Estimated Blood Loss:   negligible    Specimens Removed:  Other: Gallbladder to pathology    Complications:  None    Other (e.g. Implants):  None        Aurelie Dicenzo E. Maple Hudson, MD, 09/20/2012, 1:03 PM      Pager : [phone]                  .

## 2012-09-20 NOTE — Progress Notes (Addendum)
VSS.  Denies nausea.  Tolerated clears well.  Pt had sandwhich.  Tolerated well.  HNV.  DSg noted to be intact.  2100 voided lg amts.  Amb entire unit.

## 2012-09-20 NOTE — H&P (Signed)
- SURGERY ADMISSION H&P NOTE -    Chief Complaint: History of acute cholecystitis       The history is provided by the patient.       Past Medical History:     Past Medical History    ACUTE GASTRITIS W/O HEMORRHAGE 06/12/2004    Comment: H. Pylori positive (done at [facility]); GI consult Dr. Sharrie Rothman, TOC 10/01/04    CLOSTRIDIUM DIFFICILE 07/26/2004    Comment: GI cosult: Dr. Suzie Portela, TOC done on 10/01/04    ALLERGY, UNSPECIFIED 03/26/2004    Comment: Seen by ENT, deviated septum, hypertrophy of inferior nasal turbinates    Esophageal reflux     Unspecified asthma(493.90)     Anxiety        Past Surgical History:       Past Surgical History    NO SIGNIFICANT SURGICAL HISTORY      OB ANTEPARTUM CARE CESAREAN DLVR & POSTPARTUM         Allergies:   Review of Patient's Allergies indicates:   Erythromycin            Itching    Medications prior to Admission:     Current Outpatient Prescriptions on File Prior to Encounter:  omeprazole (PRILOSEC) 40 MG capsule Take 40 mg by mouth daily. Disp:  Rfl:    Cetirizine HCl (ZYRTEC PO)  Disp:  Rfl:    ibuprofen (MOTRIN) 800 MG tablet Take 1 tablet by mouth every 8 (eight) hours as needed for Pain. with food or milk Disp: 20 tablet Rfl: 0   cetirizine (ZYRTEC) 10 MG tablet Take 10 mg by mouth daily. Disp:  Rfl:    MECLIZINE HCL 25 MG OR TABS 1 TABLET 3 TIMES DAILY AS NEEDED Disp: 60 Rfl: 0     No current facility-administered medications on file prior to encounter.    Tobacco Use:     Smoking status: Never Smoker    Smokeless tobacco: Never Used       Alcohol:     Alcohol Use: No       Family History:     Family History    Diabetes Mother     Hypertension Mother     Lipids Mother     Hypertension Father        Review of Systems       Vital Signs - Last 24 Hours:   BP: (136)/(70)   Temp:  [98.4 F (36.9 C)]   Pulse:  [93]   Resp:  [20]   SpO2:  [98 %]     Vital Signs - Last 8 Hours:   BP: (136)/(70)   Temp:  [98.4 F (36.9 C)]   Pulse:  [93]   Resp:  [20]   SpO2:  [98 %]      Intake/Output last 24hours (7a-7a):        Last 8 Hours:         Physical Exam  GEN: A&O x3, NAD  CV: RRR, nl S1/S2  PULM: Normal respiratory effort, CTAB  ABD: Soft, NT/ND  EXT: MAE, WWP         DATA LAST 24 HOURS:     Recent labs:    No results found for this basename: WBC, HGB, HCT, PLTA, NEUT, LYMPH, MO, EOS, BASO,  in the last 24 hours    No results found for this basename: NA, K, CL, CO2, BUN, CREAT, GLUCOSER, CA, MG, PHOS, TBILI, DBILI, IBIL, AST, ALT, ALKPHOS, ALBUMIN,  in the last  24 hours      Component Value Date   UACOL YELLOW 07/21/2012   UACOL yellow 09/11/2006   UACLA CLEAR 07/21/2012   UACLA clear 09/11/2006   UAPRO NEGATIVE 07/21/2012   UAPRO negative 09/11/2006   UAGLU NEGATIVE 07/21/2012   UAGLU negative 09/11/2006   UAKET NEGATIVE 07/21/2012   UAKET negative 09/11/2006   UAOCC NEGATIVE 07/21/2012   UAOCC negative 09/11/2006   UANIT NEGATIVE 07/21/2012   UANIT negative 09/11/2006   UABIL NEGATIVE 07/21/2012   UABIL negative 09/11/2006   UAWBC FEW 3-4 02/28/2006   UARBC FEW 3-4* 02/28/2006   UASQE >10* 02/28/2006   UACRY NONE SEEN 02/28/2006   UABAC 10-50* 02/28/2006   UACAS NONE SEEN 02/28/2006       Imaging: 07/2012 RUQ u/s: Multiple gallstones including one impacted in the neck. Minimal   gallbladder wall thickening and trace pericholecystic of fluid.       Assessment/Plan: 52F with gallstones and history of acute cholecystitis managed with antibioitics, now presents for elective laparoscopic cholecystectomy.  --To OR for lap CCY +/- IOC  --ABX: Ancef  --DVT ppx: SCDs  --Admit post-op for 23h observation      Nomi Rudnicki E. Maple Hudson, MD, 09/20/2012, 10:42 AM       Pager [phone]

## 2012-09-20 NOTE — Progress Notes (Addendum)
Problem: Pain  Goal: Patients pain/discomfort is manageable  Assess and monitor patients pain using appropriate pain scale. Collaborate with interdisciplinary team and initiate plan and interventions as ordered. Re-assess patients pain level 30 - 60 minutes after pain management intervention.   Med w toradol at 1603 for c/o burning sensation to epigastric area.  Denies any other pain. States has gerd has taken her protonix this am.  Protonix given as ordered.  States she has no pain other than epigastric burning which has improved.

## 2012-09-20 NOTE — Interval H&P Note (Signed)
BP 136/70   Pulse 93   Temp(Src) 98.4 F (36.9 C) (Temporal)   Resp 20   Ht 5\' 3"  (1.6 m)   Wt 270 lb (122.471 kg)   BMI 47.84 kg/m2   SpO2 98%   LMP 08/13/2012  Pt. NPO greater than 8 hrs.  Stable for surgery today.    Rivka Spring, MD, 09/20/2012, 10:39 AM   pager [phone]

## 2012-09-21 MED ORDER — KETOROLAC TROMETHAMINE 30 MG/ML IJ SOLN
15.0000 mg | Freq: Four times a day (QID) | INTRAMUSCULAR | Status: DC
Start: 2012-09-21 — End: 2012-09-21
  Administered 2012-09-21 (×2): 15 mg via INTRAVENOUS
  Filled 2012-09-21: qty 1

## 2012-09-21 MED ORDER — OXYCODONE-ACETAMINOPHEN 5-325 MG PO TABS
1.00 | ORAL_TABLET | ORAL | Status: AC | PRN
Start: 2012-09-21 — End: 2012-09-28

## 2012-09-21 MED ORDER — DSS 100 MG PO CAPS
100.00 mg | ORAL_CAPSULE | Freq: Two times a day (BID) | ORAL | Status: AC
Start: 2012-09-21 — End: 2012-10-21

## 2012-09-21 NOTE — Progress Notes (Signed)
09/21/12 0700   Vital Signs   Temp 98.5 F (36.9 C)   Temp src Oral   Pulse 92   Resp 20   BP 123/70 mmHg   BP Location Left arm   BP Method Automatic   MAP (mmHg) 87.67   Patient Position Lying   Oxygen Therapy   SpO2 98 %   O2 Device RA   Pain   Currently in Pain UTA   Unable To Assess Reason Patient sleeping   A&O x 3. SL removed and D/C in epic. Lung clear bilaterally. Abd soft, +BS. Abd dressings dry and intact with small amount old serosanguinous drainage on right inner and right outer quad dressings.  +CSM and radial/pedal pulses bilaterally. Tolerating diet and voiding well.  Belongings returned to patient. Up to date on immunizations. All nursing care plans, ed and diagnoses resolved at D/C. Pt displayed good verbal understanding of D/C instructions. Provided with scripts. D/C home with no services.

## 2012-09-21 NOTE — Progress Notes (Signed)
Chart reviewed, patient is a 42 year old woman presenting for elective laparoscopic cholecystectomy.  S/P Laparoscopic cholecystectomy and umbilical hernia repair 09/20/12.  Patient seen by surgical service, progressing well, pain controlled, ambulatory, regular diet.  Plan d/c to home today, pt resides with family, independent at baseline.

## 2012-09-21 NOTE — Progress Notes (Signed)
MRN: [id number]  Patient Name: Alejandra Martin  DOB: [id number]        Post-op Anesthesia Note      S/p lap chole       Under*: General    *Anesthesia Types  General= General  Anesthesia  Block= Regional Anesthesia Block as the primary Anesthesia  MAC= Monitored Anesthesia Care (sedation)  Spinal= Spinal AnesthesiaEpidural= Epidural Anesthesia  CSE= Combined Spinal and Epidural Anesthesia    At time of this report, the patient is:  Awake    Vital Signs:  BP 123/70   Pulse 92   Temp(Src) 98.5 F (36.9 C) (Oral)   Resp 20   Ht 5\' 3"  (1.6 m)   Wt 270 lb (122.471 kg)   BMI 47.84 kg/m2   SpO2 98%   LMP 08/13/2012    Assessments:  PostOp Hydration: Volume status adequate  Oxygen requirement: Room air  PostOp Airway Status: Patient without airway adjuncts  Nausea/Vomiting: No evidence of nausea/vomiting  Pain control adequate: Yes  Evidence of unexpected intra-op awareness:No    Post-operative concerns: No  Additional follow-up Anesthesia required: No      If there are any questions or concerns regarding this patients's anesthetic please contact the primary anesthesia provider or page the anesthesia on call team at [phone].      Rivka Spring, MD, 09/21/2012, 10:24 AM                           Pager : [phone]

## 2012-09-21 NOTE — Progress Notes (Signed)
Surgery Progress Note    Post-op day #: 1     S/P: lap CCY    Reason for Hospitalization: acute-on-chronic cholecystitis    Subjective: Minimal sleep overnight, pain well controlled. Feels hungry, tolerated clears yesterday. Ambulated x1. Voiding spontaneously. Epigastric burning sensation relieved with Protonix    Objective:  Vitals:  BP 123/70   Pulse 92   Temp(Src) 98.5 F (36.9 C) (Oral)   Resp 20   Ht 5\' 3"  (1.6 m)   Wt 270 lb (122.471 kg)   BMI 47.84 kg/m2   SpO2 98%   LMP 08/13/2012  Temp:  [97.6 F (36.4 C)-98.8 F (37.1 C)] 98.5 F (36.9 C)  Pulse:  [79-96] 92  Resp:  [8-20] 20  BP: (119-143)/(64-92) 123/70 mmHg    Ins/Outs:  I/O 24 Hrs:  In: 1830 [P.O.:1130; I.V.:700]  Out: 900 [Urine:900]       Physical Exam:  GEN: A&O x3, NAD  CV: RRR, nl S1/S2  PULM: Normal respiratory effort, CTAB  ABD: Soft, appropriately tender, ND, dressings c/d/i  EXT: MAE, WWP    LABS:  Component Value Date   NA 138 07/21/2012   K 3.8 07/21/2012   CL 99 07/21/2012   CO2 29 07/21/2012   BUN 10 07/21/2012   CREAT 0.7 07/21/2012   GLUCOSER 86 07/21/2012   TBILI 0.4 07/21/2012   AST 14 07/21/2012   ALT 23 07/21/2012   ALKPHOS 98 07/21/2012     Component Value Date   WBC 12.6* 07/21/2012   HGB 11.9 07/21/2012   HCT 37.7 07/21/2012   PLTA 266 07/21/2012       Cultures: None    Imaging: None    Assessment/Plan:  1F POD #1 s/p lap CCY for acute-on-chronic cholecystitis, doing well.  --Diet: Regular, HLIV  --Pain: Percocet prn  --Ambulate, SCDs, SQH  --Likely discharge home today    Tani Virgo E. Maple Hudson, MD  Pager #: [phone]

## 2012-09-23 ENCOUNTER — Ambulatory Visit (HOSPITAL_BASED_OUTPATIENT_CLINIC_OR_DEPARTMENT_OTHER): Payer: No Typology Code available for payment source | Admitting: Gastroenterology

## 2012-09-23 VITALS — BP 143/76 | HR 76 | Temp 98.8°F | Resp 16 | Wt 270.0 lb

## 2012-09-23 DIAGNOSIS — Z9049 Acquired absence of other specified parts of digestive tract: Secondary | ICD-10-CM

## 2012-09-23 NOTE — Progress Notes (Signed)
This 42 year old English speaking patient presents for follow up of her cholecystitis.    The patient had her laparoscopic cholecystectomy on 09/20/12. The path is still pending however the patient reports that Dr. Veto Kemps said that "it needed to come out". She also had an umbilical hernia repaired.    The patient has some incisional discomfort and mild anorexia, but is otherwise doing well. She denieddysphagia, odynophagia, nausea, vomiting, hematemesis, hematochezia, melena, feve or jaundice.      Past Medical History  Patient Active Problem List:     ALLERGY, UNSPECIFIED     ACUTE GASTRITIS W/O HEMORRHAGE     CLOSTRIDIUM DIFFICILE     PURE HYPERCHOLESTEROLEM     GENERALIZED ANXIETY DIS     Benign Positional Vertigo     Contraceptive Management     Tubular Adenoma     GERD (gastroesophageal reflux disease)     Diarrhea     Cholecystitis          Past Surgical History    NO SIGNIFICANT SURGICAL HISTORY      OB ANTEPARTUM CARE CESAREAN DLVR & POSTPARTUM         Review of Patient's Allergies indicates:   Erythromycin            Itching    Social Hx    Smoking Status: Never Smoker                      Smokeless Status: Never Used                        Alcohol Use: No                  Current Outpatient Prescriptions:  docusate sodium 100 MG CAPS Take 100 mg by mouth 2 (two) times daily. Disp: 60 capsule Rfl: 3   oxycodone-acetaminophen (PERCOCET) 5-325 MG per tablet Take 1 tablet by mouth every 4 (four) hours as needed. Max Daily Amount: 6 tablets. Disp: 30 tablet Rfl: 0   albuterol (PROVENTIL HFA,VENTOLIN HFA, PROAIR HFA) 108 (90 BASE) MCG/ACT inhaler Inhale 2 puffs into the lungs 2 (two) times daily. Disp:  Rfl:    albuterol (PROVENTIL HFA,VENTOLIN HFA, PROAIR HFA) 108 (90 BASE) MCG/ACT inhaler Inhale 2 puffs into the lungs as needed for Wheezing. Disp:  Rfl:    omeprazole (PRILOSEC) 40 MG capsule Take 40 mg by mouth daily. Disp:  Rfl:    Cetirizine HCl (ZYRTEC PO)  Disp:  Rfl:    cetirizine (ZYRTEC)  10 MG tablet Take 10 mg by mouth daily. Disp:  Rfl:      No current facility-administered medications for this visit.    Family Hx    Family History    Diabetes Mother     Hypertension Mother     Lipids Mother     Hypertension Father        REVIEW OF SYSTEMS:  HEENT: No loose teeth, nosebleeds, glaucoma, cataracts     Cardiovascular:  No chest pain, palpitations, HTN, hypercholesterolemia, MI or heart failure.  Pulmonary:  No pneumonia, COPD, chronic cough,TB or asthma.     Neuro:  No stroke, seizure, migraines or loss of consciousness.     Endocrine:  No diabetes or thyroid disease.     Immunology:  No TB, hepatitis A, hepatitis B, hepatitis C, HIV or rheumatic fever.      Physical Exam:  Vital Signs: BP 143/76   Pulse 76  Temp(Src) 98.8 F (37.1 C)   Resp 16   Wt 270 lb (122.471 kg)   BMI 47.84 kg/m2   SpO2 97%   LMP 08/13/2012 Body mass index is 47.84 kg/(m^2).  General: Morbidly body habitus.   Pulmonary: Clear to auscultation and percussion. No rales, wheezes or rhonchi.  Cardiovascular: S1, S2, no S3 or murmurs.    Abdominal: Normal bowel sounds. No tenderness on light or deep palpation. No hepatomegaly or splenomegaly.  Neurological: Sensation intact. Normal motor function. Normal gait. Alert and oriented x 3      Impression:  S/P cholecystectomy  (primary encounter diagnosis)    Medical Decision Making:  The patient is having post procedure discomfort but otherwise appears to be doing well. Her appetite is down. I have suggested she use this as an opportunity to loose weight. I suspect her reduced appetite is from her recent surgery.    Reviewed with patient who agrees with our plan. Follow up with me in 3 months.

## 2012-09-24 LAB — SURGICAL PATH SPECIMEN

## 2012-09-24 NOTE — H&P (Signed)
I have seen and examined the patient and I agree with the findings and the assessment and plan in the above note.    Tamyka Bezio, MD   Pager [phone]

## 2012-09-24 NOTE — Progress Notes (Signed)
I have seen and examined the patient and I agree with the findings and the assessment and plan in the above note.  Instructions given for discharge.  Mercadez Heitman, MD   Pager [phone]

## 2012-09-30 ENCOUNTER — Ambulatory Visit (HOSPITAL_BASED_OUTPATIENT_CLINIC_OR_DEPARTMENT_OTHER): Payer: No Typology Code available for payment source | Admitting: Surgery

## 2012-09-30 ENCOUNTER — Ambulatory Visit (HOSPITAL_BASED_OUTPATIENT_CLINIC_OR_DEPARTMENT_OTHER): Payer: No Typology Code available for payment source | Admitting: Gastroenterology

## 2012-09-30 ENCOUNTER — Encounter (HOSPITAL_BASED_OUTPATIENT_CLINIC_OR_DEPARTMENT_OTHER): Payer: Self-pay | Admitting: Surgery

## 2012-09-30 DIAGNOSIS — K819 Cholecystitis, unspecified: Secondary | ICD-10-CM

## 2012-09-30 NOTE — Progress Notes (Signed)
Alejandra Martin follows up after lap cholecystectomy on 09/20/12. Findings included residual cholecystitis and cholelithiasis. She is doing well now and happy to be done with the surgery. Some RUQ pain at the incision and in the epigastric area when moving. Has lump from stitches at the umbilcius. Incidental umbilical hernia was repaired.    The patient appears well and in no distress.   The incisions are clean and intact with appropriate tenderness and no sign of infection. Normal feeling lump from deeper umbilical stitches. No hernia felt.    A/P: Recovering well.  Return to normal activities. Follow up as needed. Call for problems or questions.  Pathology reviewed and was benign.  Follow up in about 2 months to make sure epigastric pain goes away and to check the umbilical repair.  EGD if epigastric pain is still there.

## 2012-10-06 ENCOUNTER — Ambulatory Visit (HOSPITAL_BASED_OUTPATIENT_CLINIC_OR_DEPARTMENT_OTHER): Payer: No Typology Code available for payment source | Admitting: Surgery

## 2012-10-06 ENCOUNTER — Encounter (HOSPITAL_BASED_OUTPATIENT_CLINIC_OR_DEPARTMENT_OTHER): Payer: Self-pay | Admitting: Surgery

## 2012-10-06 VITALS — BP 121/71 | HR 82 | Temp 98.4°F

## 2012-10-06 DIAGNOSIS — B372 Candidiasis of skin and nail: Secondary | ICD-10-CM

## 2012-10-06 DIAGNOSIS — K819 Cholecystitis, unspecified: Secondary | ICD-10-CM

## 2012-10-06 NOTE — Progress Notes (Signed)
She comes in for a wound check of the umbilicus following lap chole on 09/18/12. Generally ok but has a crust on the umbilical wound. Has been showering only, no other cleaning. Umbilicus is hidden by pannus.  On exam she looks well, abd soft and RUQ incisions are well healed. Umbilicus has some stitch reaction and is open about 2 mm. Has very small amount crust. Some surrounding yeast on the skin, mild.    Instructed to clean with hydrogen peroxide. Use gauze to soak up extra moisture. Leave open to air periodically.    Follow up in 1 week for wound check.  If worsens, can use nystatin cream.

## 2012-10-14 ENCOUNTER — Encounter (HOSPITAL_BASED_OUTPATIENT_CLINIC_OR_DEPARTMENT_OTHER): Payer: Self-pay | Admitting: Surgery

## 2012-10-14 ENCOUNTER — Ambulatory Visit (HOSPITAL_BASED_OUTPATIENT_CLINIC_OR_DEPARTMENT_OTHER): Payer: No Typology Code available for payment source | Admitting: Surgery

## 2012-10-14 DIAGNOSIS — K819 Cholecystitis, unspecified: Secondary | ICD-10-CM

## 2012-10-14 NOTE — Progress Notes (Signed)
She follows up after lap chole and umbilical hernia repair on 09/20/12. Umbilicus is doing better. Small amount drainage, cleaning with peroxide. Incisions are itchy.  No fevers or chills.    When she swallows cold water she has esophageal pain in the mid chest and back.  She saw Dr. Suzie Portela and will see him again in November. She is asking about having an EGD and I have recommended she wait for another few months to let the post surgical effects resolve.   No nausea or vomiting. She has lost some weight.    Follow up as needed.

## 2012-11-25 ENCOUNTER — Ambulatory Visit (HOSPITAL_BASED_OUTPATIENT_CLINIC_OR_DEPARTMENT_OTHER): Payer: No Typology Code available for payment source | Admitting: Gastroenterology

## 2012-12-02 ENCOUNTER — Ambulatory Visit (HOSPITAL_BASED_OUTPATIENT_CLINIC_OR_DEPARTMENT_OTHER): Payer: No Typology Code available for payment source | Admitting: Surgery

## 2012-12-30 ENCOUNTER — Encounter (HOSPITAL_BASED_OUTPATIENT_CLINIC_OR_DEPARTMENT_OTHER): Payer: Self-pay | Admitting: Gastroenterology

## 2012-12-30 ENCOUNTER — Ambulatory Visit (HOSPITAL_BASED_OUTPATIENT_CLINIC_OR_DEPARTMENT_OTHER): Payer: No Typology Code available for payment source | Admitting: Gastroenterology

## 2012-12-30 VITALS — BP 120/79 | HR 80 | Temp 97.8°F | Resp 12 | Ht 63.0 in | Wt 266.0 lb

## 2012-12-30 DIAGNOSIS — K219 Gastro-esophageal reflux disease without esophagitis: Secondary | ICD-10-CM

## 2012-12-30 DIAGNOSIS — R630 Anorexia: Secondary | ICD-10-CM

## 2012-12-30 NOTE — Progress Notes (Signed)
This 42 year old English speaking patient presents for follow up of her GERD.     She notes that when she eats she gets regurgitation and difficulty swallowing.     She also has a pain in her chest. This gets worse with movement and pressure on the lower sternum. I belvie this is a different pain from her reflux type discomfort.    The patient's father died from stomach cancer    She reports a burning epigastric abdominal pain, heartburn, regurgitation, dysphagia, early satiety and some slight weight loss (with dieting and exercise). She denied nausea, vomiting, hematemesis, hematochezia, melena or jaundice.    The patient now presents for follow up.    Past Medical History  Patient Active Problem List:     ALLERGY, UNSPECIFIED     ACUTE GASTRITIS W/O HEMORRHAGE     CLOSTRIDIUM DIFFICILE     PURE HYPERCHOLESTEROLEM     GENERALIZED ANXIETY DIS     Benign Positional Vertigo     Contraceptive Management     Tubular Adenoma     GERD (gastroesophageal reflux disease)     Diarrhea     Cholecystitis          Past Surgical History    NO SIGNIFICANT SURGICAL HISTORY      OB ANTEPARTUM CARE CESAREAN DLVR & POSTPARTUM         Review of Patient's Allergies indicates:   Erythromycin            Itching    Social Hx    Smoking Status: Never Smoker                      Smokeless Status: Never Used                        Alcohol Use: No                  Current Outpatient Prescriptions:  albuterol (PROVENTIL HFA,VENTOLIN HFA, PROAIR HFA) 108 (90 BASE) MCG/ACT inhaler Inhale 2 puffs into the lungs 2 (two) times daily. Disp:  Rfl:    albuterol (PROVENTIL HFA,VENTOLIN HFA, PROAIR HFA) 108 (90 BASE) MCG/ACT inhaler Inhale 2 puffs into the lungs as needed for Wheezing. Disp:  Rfl:    omeprazole (PRILOSEC) 40 MG capsule Take 40 mg by mouth daily. Disp:  Rfl:    Cetirizine HCl (ZYRTEC PO)  Disp:  Rfl:    cetirizine (ZYRTEC) 10 MG tablet Take 10 mg by mouth daily. Disp:  Rfl:      No current facility-administered medications  for this visit.    Family Hx    Family History    Diabetes Mother     Hypertension Mother     Lipids Mother     Hypertension Father        REVIEW OF SYSTEMS:  HEENT: No loose teeth, nosebleeds, glaucoma, cataracts     Cardiovascular:  No chest pain, palpitations, HTN, hypercholesterolemia, MI or heart failure.  Pulmonary:  No pneumonia, COPD, chronic cough,TB or asthma.     Neuro:  No stroke, seizure, migraines or loss of consciousness.     Endocrine:  No diabetes or thyroid disease.     Immunology:  No TB, hepatitis A, hepatitis B, hepatitis C, HIV or rheumatic fever.  Psych: Anxious. Grieving recent loss of father from gastric cancer.    Physical Exam:  Vital Signs: BP 120/79   Pulse 80   Temp(Src) 97.8  F (36.6 C) (Temporal)   Resp 12   Ht 5\' 3"  (1.6 m)   Wt 266 lb (120.657 kg)   BMI 47.13 kg/m2   SpO2 99% Body mass index is 47.13 kg/(m^2).  General: Obese body habitus.   HEENT: Normocephalic, atrumatic, PERRLA, Extra occular motions intact. No scleral icterus. Pharynx benign. Tongue midline.  LN: Without cervical, supraclavicular or infraclavicular lymphadenopathy.  Pulmonary: Clear to auscultation and percussion. No rales, wheezes or rhonchi.  Cardiovascular: S1, S2, no S3 or murmurs.    Abdominal: Normal bowel sounds. No tenderness on light or deep palpation. No hepatomegaly or splenomegaly.  Neurological: Sensation intact. Normal motor function. Normal gait. Alert and oriented x 3    Impression:  Anorexia  (primary encounter diagnosis)  GERD (gastroesophageal reflux disease)    Medical Decision Making:  The patient is again having symptoms of heartburn and mild dysphagia. I suspect this is benign but given the degree of her concern an EGD at this time would be reasonable to see if there has been an unexpected change. The patient was informed of the risk of bleeding, discomfort, perforation, a need for surgery, infection, drug reaction, cardiovascular and cerebrovascular compromise and the possibility of  missing a lesion or problem.  She understood these risks and consented to the exam.  She may have sleep apnea. Will do the EGD with anesthesia and refer her to her physician for initiation of formal testing.     She has some chest wall pain. Given her obesity CAD must also be considered.     The patient is still grieving the loss of her father. I have again suggested grief counseling.     Reviewed with patient who agrees with our plan. Over 25 minutes spent with the patient, more than half of which were in counseling.

## 2013-01-03 ENCOUNTER — Ambulatory Visit (HOSPITAL_BASED_OUTPATIENT_CLINIC_OR_DEPARTMENT_OTHER)
Admit: 2013-01-03 | Discharge: 2013-01-03 | Disposition: A | Discharge status: Home / Self Care | Payer: Self-pay | Source: Ambulatory Visit | Attending: Gastroenterology | Admitting: Gastroenterology

## 2013-01-03 ENCOUNTER — Encounter (HOSPITAL_BASED_OUTPATIENT_CLINIC_OR_DEPARTMENT_OTHER): Payer: Self-pay | Admitting: Gastroenterology

## 2013-01-03 HISTORY — DX: Pure hypercholesterolemia, unspecified: E78.00

## 2013-01-03 HISTORY — DX: Presence of spectacles and contact lenses: Z97.3

## 2013-01-03 HISTORY — DX: Disease of stomach and duodenum, unspecified: K31.9

## 2013-01-03 HISTORY — DX: Snoring: R06.83

## 2013-01-03 NOTE — Discharge Instructions (Signed)
SURGICAL DAY CARE PRE-OPERATIVE INSTRUCTIONS    DAY OF SURGERY    Arrive at: [facility] Registration on Wednesday, November  19 at:  3pm    You must have a responsible adult available to accompany you home after surgery. (We suggest that you have someone available to assist you at home after your surgery).    INSTRUCTIONS:      You may have nothing to eat or drink after midnight the night before your surgery, not even water, gum or hard candy.      You may not smoke the morning of your surgery.     Please leave all valuables at home, including jewelry, watches, money, etc.     Please remove all fingernail polish before arriving at Surgical Day Care and do not  wear any face or lip make-up.     Do not shave surgical site.     Do not wear contact lenses.    MEDICATIONS:     Take the following medication the night before surgery at bedtime: usual    Take the following medication the morning of your surgery with only a sip of water: omeprazole

## 2013-01-03 NOTE — H&P (Signed)
Pre-Anesthetic Note    Patient:  Alejandra Martin is a 42 year old female  Height: 5\' 2"  (157.5 cm)  Weight: 265 lb 12.8 oz (120.566 kg)    Review of Patient's Allergies indicates:   Erythromycin            Itching    Diagnosis:   Abdominal pain  Procedure:   EGD   Anesthetic Plan:   MAC with GA     Previous Anesthetic History:       Past Surgical History    NO SIGNIFICANT SURGICAL HISTORY      OB ANTEPARTUM CARE CESAREAN DLVR & POSTPARTUM      CHOLECYSTECTOMY       Patient denies complications of anesthesia.  Patient denies family complications of anesthesia.  Current Medications:      Current Outpatient Prescriptions on File Prior to Encounter:  albuterol (PROVENTIL HFA,VENTOLIN HFA, PROAIR HFA) 108 (90 BASE) MCG/ACT inhaler Inhale 2 puffs into the lungs 2 (two) times daily. Disp:  Rfl:    omeprazole (PRILOSEC) 40 MG capsule Take 40 mg by mouth daily. Disp:  Rfl:    cetirizine (ZYRTEC) 10 MG tablet Take 10 mg by mouth daily. Disp:  Rfl:    albuterol (PROVENTIL HFA,VENTOLIN HFA, PROAIR HFA) 108 (90 BASE) MCG/ACT inhaler Inhale 2 puffs into the lungs as needed for Wheezing. Disp:  Rfl:    Cetirizine HCl (ZYRTEC PO)  Disp:  Rfl:      No current facility-administered medications on file prior to encounter.  Hospital Meds    PRN Meds        Social Hx:     Smoking status: Never Smoker     Smokeless tobacco: Never Used    Alcohol Use: No       Drug Use: No     PMHx:    Past Medical History    ACUTE GASTRITIS W/O HEMORRHAGE 06/12/2004    Comment: H. Pylori positive (done at [facility]); GI consult Dr. Sharrie Rothman, TOC 10/01/04    CLOSTRIDIUM DIFFICILE 07/26/2004    Comment: GI cosult: Dr. Suzie Portela, TOC done on 10/01/04    ALLERGY, UNSPECIFIED 03/26/2004    Comment: Seen by ENT, deviated septum, hypertrophy of inferior nasal turbinates    Esophageal reflux     Unspecified asthma(493.90)     Anxiety     Wears eyeglasses     Comment: night driving    Snores     Elevated cholesterol     Comment: resolved without meds    Stomach disease       PE:  BP 112/73  Pulse 95  Temp(Src) 97.9 F (36.6 C) (Temporal)  Resp 20  Ht 5\' 2"  (1.575 m)  Wt 265 lb 12.8 oz (120.566 kg)  BMI 48.6 kg/m2  SpO2 97%  LMP 11/30/2012   Vitals Range      Temp:  [97.9 F (36.6 C)] 97.9 F (36.6 C)  Pulse:  [95] 95  Resp:  [20] 20  BP: (112)/(73) 112/73 mmHg  Appearence:healthy, alert, well developed, well nourished, cooperative  Airway Classification:  Mallampati: Class I     A soft palate, fauces, uvula, anterior and posterior tonsil pillars are seen.     Teeth:goodDentures:None  Neck:  FROM:   Yes  Lungs:  Clear: Yes  Tests:   Component Value Date   NA 138 07/21/2012   K 3.8 07/21/2012   CREAT 0.7 07/21/2012   GLUCOSER 86 07/21/2012   WBC 12.6* 07/21/2012   HCT 37.7 07/21/2012  PLTA 266 07/21/2012   PT 10.0 04/28/2008   APTT 27.5 04/28/2008   INR 1.0* 04/28/2008      ASA Assessment: II   A Patient With Mild Systemic Disease  Emergency:  No   Potential Anesthesia Problems:  No  I have informed the patient of the nature and purpose of the type of anesthesia, the reasonable alternative anesthesia methods, pertinent foreseeable risks involved and the possibility of complications. I have explained that an alternative form of anesthesia may be required by unexpected conditions arising before or during the procedure. Questions have been answered to the satisfaction of the patient who accepts the risk and agrees to proceed as planned.   Hugh Kamara     Pager: [phone]

## 2013-01-04 ENCOUNTER — Ambulatory Visit (HOSPITAL_BASED_OUTPATIENT_CLINIC_OR_DEPARTMENT_OTHER): Admit: 2013-01-04 | Payer: Self-pay | Source: Ambulatory Visit | Admitting: Gastroenterology

## 2013-01-05 ENCOUNTER — Ambulatory Visit (HOSPITAL_BASED_OUTPATIENT_CLINIC_OR_DEPARTMENT_OTHER)
Admit: 2013-01-05 | Disposition: A | Discharge status: Home / Self Care | Payer: Self-pay | Source: Ambulatory Visit | Attending: Gastroenterology | Admitting: Gastroenterology

## 2013-01-05 ENCOUNTER — Encounter (HOSPITAL_BASED_OUTPATIENT_CLINIC_OR_DEPARTMENT_OTHER): Payer: Self-pay | Admitting: Gastroenterology

## 2013-01-05 DIAGNOSIS — K838 Other specified diseases of biliary tract: Secondary | ICD-10-CM

## 2013-01-05 DIAGNOSIS — R1013 Epigastric pain: Secondary | ICD-10-CM

## 2013-01-05 DIAGNOSIS — K839 Disease of biliary tract, unspecified: Secondary | ICD-10-CM | POA: Insufficient documentation

## 2013-01-05 MED ORDER — ONDANSETRON HCL 4 MG/2ML IJ SOLN
4.0000 mg | Freq: Once | INTRAMUSCULAR | Status: DC | PRN
Start: 2013-01-05 — End: 2013-01-06

## 2013-01-05 MED ORDER — ACETAMINOPHEN 325 MG PO TABS
650.0000 mg | ORAL_TABLET | Freq: Once | ORAL | Status: DC | PRN
Start: 2013-01-05 — End: 2013-01-06

## 2013-01-05 MED ORDER — LACTATED RINGERS IV SOLN
INTRAVENOUS | Status: DC
Start: 2013-01-05 — End: 2013-01-06
  Administered 2013-01-05: 16:00:00 via INTRAVENOUS

## 2013-01-05 NOTE — Discharge Instructions (Signed)
GI CENTER DISCHARGE INSTRUCTIONS     When you return home, you may feel sleepy. Get plenty of rest for the remainder of the day.     If you received sedation for your procedure DO NOT DRIVE, OPERATE MACHINERY OR MAKE IMPORTANT DECISIONS for the reminder of the day.        It is normal after having an UPPER ENDOSCOPY to develop a mild sore throat that will last a few days.     If you had a biopsy or Polypectomy you will need to hold aspirin or medications containing aspirin for  {2} day/s.     .     If you had a biopsy or Polypectomy you will need to hold any medication such as Advil, Motrin, Naproxin, Ibuprofen etc. For 2  days.      Call your doctor immediately if you develop:   -  Chest Pain   -  Shortness of breath   -  Difficulty swallowing   -  Vomit bright red blood   -  Notice your bowel movements are black or maroon colored   -  You feel weak and tired     Call your physician for any unusual symptoms.     If for any reason you are unable to reach your doctor go to the nearest         EMERGENCY ROOM.     SPECIFIC INSTRUCTIONS:  ***await pathology    Call [phone]    Date MD Name: Telephone:   01/05/2013 Vernie Murders ***   Reviewed by preop nurse

## 2013-01-05 NOTE — Interval H&P Note (Signed)
NPO past midnight.  Patient has symptoms of heartburn, but food is not refluxing into her oropharynx.  Allergy to Erythromycin reviewed.    Will proceed with anesthesia.    Debbra Riding, MD  01/05/2013  16:15  Pager: [phone]

## 2013-01-05 NOTE — H&P (Addendum)
GI Pre-procedure History and Physical Short Form  Alejandra Martin is an 42 year old female.    Chief Complaint: She presents for Upper GI endoscopy    HPI:   This 42 year old English speaking patient presents for follow up of her GERD.     She notes that when she eats she gets regurgitation and difficulty swallowing.     She also has a pain in her chest. This gets worse with movement and pressure on the lower sternum. I belvie this is a different pain from her reflux type discomfort.    The patient's father died from stomach cancer    She reports a burning epigastric abdominal pain, heartburn, regurgitation, dysphagia, early satiety and some slight weight loss (with dieting and exercise). She denied nausea, vomiting, hematemesis, hematochezia, melena or jaundice.      Active Problems:  Patient Active Problem List:     ALLERGY, UNSPECIFIED     ACUTE GASTRITIS W/O HEMORRHAGE     CLOSTRIDIUM DIFFICILE     PURE HYPERCHOLESTEROLEM     GENERALIZED ANXIETY DIS     Benign Positional Vertigo     Contraceptive Management     Tubular Adenoma     GERD (gastroesophageal reflux disease)     Diarrhea     Cholecystitis      Past Medical History:     Past Medical History    ACUTE GASTRITIS W/O HEMORRHAGE 06/12/2004    Comment: H. Pylori positive (done at [facility]); GI consult Dr. Sharrie Rothman, TOC 10/01/04    CLOSTRIDIUM DIFFICILE 07/26/2004    Comment: GI cosult: Dr. Suzie Portela, TOC done on 10/01/04    ALLERGY, UNSPECIFIED 03/26/2004    Comment: Seen by ENT, deviated septum, hypertrophy of inferior nasal turbinates    Esophageal reflux     Unspecified asthma(493.90)     Anxiety     Wears eyeglasses     Comment: night driving    Snores     Elevated cholesterol     Comment: resolved without meds    Stomach disease        Past Surgical History:      Past Surgical History    NO SIGNIFICANT SURGICAL HISTORY      OB ANTEPARTUM CARE CESAREAN DLVR & POSTPARTUM      CHOLECYSTECTOMY         Social History:    Social History   Marital Status: Married   Spouse Name: N/A    Years of Education: N/A  Number of Children: N/A     Occupational History  None on file     Social History Main Topics   Smoking status: Never Smoker     Smokeless tobacco: Never Used    Alcohol Use: No    Drug Use: No    Sexual Activity: Yes    Partners: Male    Comment: men age 45 q 4-6 wks x 3d, small fibroid on u/s, 1st SA age 63, 2 lifetime partners, current x 8 yrs     Other Topics Concern   None on file     Social History Narrative    07/09/2005    To [name] from the [place], [place] with family at age 39, husband from [place]    Works FT - [employer]    Pt thrilled with pregnancy-seeking pregnancy x 4 yrs, very anxious since sab 2 yrs ago       Family History:    Family History  Diabetes Mother     Hypertension Mother     Lipids Mother     Hypertension Father        Allergies:   Review of Patient's Allergies indicates:   Erythromycin            Itching    Medications:     Current Outpatient Prescriptions on File Prior to Encounter:  albuterol (PROVENTIL HFA,VENTOLIN HFA, PROAIR HFA) 108 (90 BASE) MCG/ACT inhaler Inhale 2 puffs into the lungs 2 (two) times daily. Disp:  Rfl:    albuterol (PROVENTIL HFA,VENTOLIN HFA, PROAIR HFA) 108 (90 BASE) MCG/ACT inhaler Inhale 2 puffs into the lungs as needed for Wheezing. Disp:  Rfl:    omeprazole (PRILOSEC) 40 MG capsule Take 40 mg by mouth daily. Disp:  Rfl:    Cetirizine HCl (ZYRTEC PO)  Disp:  Rfl:    cetirizine (ZYRTEC) 10 MG tablet Take 10 mg by mouth daily. Disp:  Rfl:      No current facility-administered medications on file prior to encounter.    Review of Systems:  Cardiovascular:  No racing or uneven heartbeat, chest pain or pressure, trouble breathing on lying down, leg cramps on walking, or ankle swelling  Pulmonary:  No prolonged cough, wheezing, difficulty breathing, coughing up phlegm or blood  GI - negative, hematemesis, melena and hematochezia  Neuro:  No stroke, seizure, migraines or loss of consciousness.       Physical Exam:  Vital Signs: LMP 11/30/2012  ENT: No scleral icterus or adenopathy  Airway Evaluation:  Gag reflex intact: Yes  Ability to open mouth wide:   Full  Dentures:  No  Loose teeth:  No  Neck range of motion  Full  Mallampati Airway Classification: Class II   The same as Class I except the tonsilar pillars are hidden by the tounge.  CHEST: Clear anteriorly to auscultation and percussion.   CARDIOVASC: Normal rate and rhythym, no murmurs, rubs or gallops  ABDOMEN: Normal bowel sounds. No tenderness, mass or enlargement of spleen or liver. No surgical scars.  EXTREMITIES : Without clubbing, cyanosis or edema.   NEUROLOGIC: Alert and oriented x3, no focal findings.  DERM: No jaundice, hives or rashes.    ASA Classification: ASA Class II (a patient with mild systemic disease)    Sedation: MAC    Impression:  Heartburn  Dysphagia  Family history of Stomach cancer  No contraindication to proceeding with the indicated Upper GI endoscopy    Medical Decision Making/Plan:  The patient is again having symptoms of heartburn and mild dysphagia. I suspect this is benign but given the degree of her concern an EGD at this time would be reasonable to see if there has been an unexpected change. The patient was informed of the risk of bleeding, discomfort, perforation, a need for surgery, infection, drug reaction, cardiovascular and cerebrovascular compromise and the possibility of missing a lesion or problem.  She understood these risks and consented to the exam.  She may have sleep apnea. Will do the EGD with anesthesia and refer her to her physician for initiation of formal testing.     She has some chest wall pain. Given her obesity CAD must also be considered.     The patient is still grieving the loss of her father. I have again suggested grief counseling.     Proceed with Upper GI endoscopy

## 2013-01-05 NOTE — H&P (View-Only) (Signed)
Pre-Anesthetic Note    Patient:  Alejandra Martin is a 42 year old female  Height: 5' 2" (157.5 cm)  Weight: 265 lb 12.8 oz (120.566 kg)    Review of Patient's Allergies indicates:   Erythromycin            Itching    Diagnosis:   Abdominal pain  Procedure:   EGD   Anesthetic Plan:   MAC with GA     Previous Anesthetic History:       Past Surgical History    NO SIGNIFICANT SURGICAL HISTORY      OB ANTEPARTUM CARE CESAREAN DLVR & POSTPARTUM      CHOLECYSTECTOMY       Patient denies complications of anesthesia.  Patient denies family complications of anesthesia.  Current Medications:      Current Outpatient Prescriptions on File Prior to Encounter:  albuterol (PROVENTIL HFA,VENTOLIN HFA, PROAIR HFA) 108 (90 BASE) MCG/ACT inhaler Inhale 2 puffs into the lungs 2 (two) times daily. Disp:  Rfl:    omeprazole (PRILOSEC) 40 MG capsule Take 40 mg by mouth daily. Disp:  Rfl:    cetirizine (ZYRTEC) 10 MG tablet Take 10 mg by mouth daily. Disp:  Rfl:    albuterol (PROVENTIL HFA,VENTOLIN HFA, PROAIR HFA) 108 (90 BASE) MCG/ACT inhaler Inhale 2 puffs into the lungs as needed for Wheezing. Disp:  Rfl:    Cetirizine HCl (ZYRTEC PO)  Disp:  Rfl:      No current facility-administered medications on file prior to encounter.  Hospital Meds    PRN Meds        Social Hx:     Smoking status: Never Smoker     Smokeless tobacco: Never Used    Alcohol Use: No       Drug Use: No     PMHx:    Past Medical History    ACUTE GASTRITIS W/O HEMORRHAGE 06/12/2004    Comment: H. Pylori positive (done at [facility]); GI consult Dr. Micheal Payne, TOC 10/01/04    CLOSTRIDIUM DIFFICILE 07/26/2004    Comment: GI cosult: Dr. Payne, TOC done on 10/01/04    ALLERGY, UNSPECIFIED 03/26/2004    Comment: Seen by ENT, deviated septum, hypertrophy of inferior nasal turbinates    Esophageal reflux     Unspecified asthma(493.90)     Anxiety     Wears eyeglasses     Comment: night driving    Snores     Elevated cholesterol     Comment: resolved without meds    Stomach disease       PE:  BP 112/73  Pulse 95  Temp(Src) 97.9 F (36.6 C) (Temporal)  Resp 20  Ht 5' 2" (1.575 m)  Wt 265 lb 12.8 oz (120.566 kg)  BMI 48.6 kg/m2  SpO2 97%  LMP 11/30/2012   Vitals Range      Temp:  [97.9 F (36.6 C)] 97.9 F (36.6 C)  Pulse:  [95] 95  Resp:  [20] 20  BP: (112)/(73) 112/73 mmHg  Appearence:healthy, alert, well developed, well nourished, cooperative  Airway Classification:  Mallampati: Class I     A soft palate, fauces, uvula, anterior and posterior tonsil pillars are seen.     Teeth:goodDentures:None  Neck:  FROM:   Yes  Lungs:  Clear: Yes  Tests:   Component Value Date   NA 138 07/21/2012   K 3.8 07/21/2012   CREAT 0.7 07/21/2012   GLUCOSER 86 07/21/2012   WBC 12.6* 07/21/2012   HCT 37.7 07/21/2012     PLTA 266 07/21/2012   PT 10.0 04/28/2008   APTT 27.5 04/28/2008   INR 1.0* 04/28/2008      ASA Assessment: II   A Patient With Mild Systemic Disease  Emergency:  No   Potential Anesthesia Problems:  No  I have informed the patient of the nature and purpose of the type of anesthesia, the reasonable alternative anesthesia methods, pertinent foreseeable risks involved and the possibility of complications. I have explained that an alternative form of anesthesia may be required by unexpected conditions arising before or during the procedure. Questions have been answered to the satisfaction of the patient who accepts the risk and agrees to proceed as planned.   Xariah Silvernail     Pager: [phone]

## 2013-01-05 NOTE — PROVATION-GI (Signed)
[facility]  Patient Name: Alejandra Martin  MRN: [id number]  Account Number: [id number]  Gender: Female  Age: 42  Date of Birth: 08-30-1970  Admit Type: Outpatient  Patient Location: [facility]  Note Status: Finalized  Multiple Proc?: No  Referring MD:         Vernie Murders, MD  Procedure Date:       01/05/2013 4:25:09 PM  Procedure:            Upper GI endoscopy  Endoscopist:          Vernie Murders, MD  Indications for Procedure:       Epigastric abdominal pain, Family history of gastric cancer  Medications:          Monitored Anesthesia Care  Procedure:       Pre-Anesthesia Assessment:       - Prior to the procedure, a History and Physical was performed, and patient        medications and allergies were reviewed. The patient is competent. The risks        and benefits of the procedure and the sedation options and risks were        discussed with the patient. All questions were answered and informed consent        was obtained. Patient identification and proposed procedure were verified by        the physician and the nurse in the procedure room. Mental Status Examination:        alert and oriented. Airway Examination: normal oropharyngeal airway and neck        mobility and Mallampati Class IV (tongue obstructs view of the soft palate).        Respiratory Examination: clear to auscultation. CV Examination: normal.        Prophylactic Antibiotics: The patient does not require prophylactic        antibiotics. Prior Anticoagulants: The patient has taken no previous        anticoagulant or antiplatelet agents. ASA Grade Assessment: II - A patient        with mild systemic disease. After reviewing the risks and benefits, the        patient was deemed in satisfactory condition to undergo the procedure. The        anesthesia plan was to use monitored anesthesia care (MAC). Immediately prior        to administration of medications, the patient was re-assessed for adequacy to        receive sedatives. The heart rate,  respiratory rate, oxygen saturations,        blood pressure, adequacy of pulmonary ventilation, and response to care were        monitored throughout the procedure. The physical status of the patient was        re-assessed after the procedure.       Just prior to the procedure, an updated history and physical was done. I        obtained an informed consent from the patient reviewing the risk of the        procedure including (but not limited to) respiratory depression, perforation,        bleeding, discomfort, a possible need for surgery and unexpected reactions to        medications. The patient is aware that test has limitations and may not        detect significant lesions such as cancer or other potential diseases. The  patient was also informed that they might need a repeat upper endoscopy        earlier than standard guidelines if there are changes in their symptoms or        concerning findings noted. A time out was performed with the entire procedure        staff present. The scope was passed under direct vision. Throughout the        procedure, the patient's blood pressure, pulse, and oxygen saturations were        monitored continuously. The Endoscope was introduced through the mouth, and        advanced to the third part of duodenum. The upper GI endoscopy was        accomplished without difficulty. The patient tolerated the procedure well.        The total duration of the procedure was 8 minutes.  Findings:       The oropharynx was normal.       The upper third of the esophagus, middle third of the esophagus and lower        third of the esophagus were normal.       LA Grade A (one or more mucosal breaks less than 5 mm, not extending between        tops of 2 mucosal folds) esophagitis with no bleeding was found 36 to 37 cm        from the incisors.       No gross lesions were noted in the gastric body and in the gastric antrum.        Four biopsies were obtained in the gastric body and in the  gastric antrum        with cold forceps for histology and Helicobacter pylori testing. Estimated        blood loss was minimal.       The cardia and gastric fundus were normal on retroflexion.       A moderate extrinsic deformity was found in the area of the papilla. Biopsies        were taken with a cold forceps for histology. Estimated blood loss was        minimal.       The 3rd part of the duodenum was normal. Biopsies were taken with a cold        forceps for histology. Estimated blood loss was minimal.       The exam was otherwise without abnormality.  Post Procedure Diagnosis:       - Normal oropharynx.       - Normal upper third of esophagus, middle third of esophagus and lower third        of esophagus.       - LA Grade A reflux esophagitis.       - No gross lesions in the stomach.       - Duodenal deformity. Biopsied.       - Normal 3rd part of the duodenum. Biopsied.       - The examination was otherwise normal.       - Four biopsies were obtained in the gastric body and in the gastric antrum.  Complications:        No immediate complications. Estimated blood loss: Minimal.  Recommendation:       - Discharge patient to home (ambulatory).       - Await pathology results.       - Patient has a contact  number available for emergencies. The signs and        symptoms of potential delayed complications were discussed with the patient.        Return to normal activities tomorrow. Written discharge instructions were        provided to the patient.       - Regular diet indefinitely.       - Return to my office 8 to 12 weeks.  Vernie Murders, MD  01/05/2013 5:59 PM  This report has been signed electronically.  Number of Addenda: 0  Note Initiated On: 01/05/2013 4:25 PM

## 2013-01-12 LAB — SURGICAL PATH SPECIMEN

## 2013-01-20 ENCOUNTER — Ambulatory Visit (HOSPITAL_BASED_OUTPATIENT_CLINIC_OR_DEPARTMENT_OTHER): Payer: No Typology Code available for payment source | Admitting: Gastroenterology

## 2013-01-20 VITALS — BP 145/78 | HR 94 | Temp 98.5°F | Resp 16 | Wt 264.0 lb

## 2013-01-20 DIAGNOSIS — R1013 Epigastric pain: Secondary | ICD-10-CM

## 2013-01-20 DIAGNOSIS — K838 Other specified diseases of biliary tract: Secondary | ICD-10-CM

## 2013-01-20 DIAGNOSIS — K219 Gastro-esophageal reflux disease without esophagitis: Secondary | ICD-10-CM

## 2013-01-20 NOTE — Progress Notes (Signed)
This 42 year old English speaking patient presents for follow up of her Upper GI endoscopy.    The patient was found to have...  Findings:       The oropharynx was normal.       The upper third of the esophagus, middle third of the esophagus and lower         third of the esophagus were normal.       LA Grade A (one or more mucosal breaks less than 5 mm, not extending between         tops of 2 mucosal folds) esophagitis with no bleeding was found 36 to 37 cm         from the incisors.       No gross lesions were noted in the gastric body and in the gastric antrum.         Four biopsies were obtained in the gastric body and in the gastric antrum         with cold forceps for histology and Helicobacter pylori testing. Estimated         blood loss was minimal.       The cardia and gastric fundus were normal on retroflexion.       A moderate extrinsic deformity was found in the area of the papilla. Biopsies         were taken with a cold forceps for histology. Estimated blood loss was         minimal.       The 3rd part of the duodenum was normal. Biopsies were taken with a cold         forceps for histology. Estimated blood loss was minimal.       The exam was otherwise without abnormality.    We reviewed its significance.    Past Medical History  Patient Active Problem List:     ALLERGY, UNSPECIFIED     ACUTE GASTRITIS W/O HEMORRHAGE     CLOSTRIDIUM DIFFICILE     PURE HYPERCHOLESTEROLEM     GENERALIZED ANXIETY DIS     Benign Positional Vertigo     Contraceptive Management     Tubular Adenoma     GERD (gastroesophageal reflux disease)     Diarrhea     Cholecystitis     Epigastric abdominal pain     Mass of ampulla of Vater      Past Surgical History      Past Surgical History    NO SIGNIFICANT SURGICAL HISTORY      OB ANTEPARTUM CARE CESAREAN DLVR & POSTPARTUM      CHOLECYSTECTOMY         Review of Patient's Allergies indicates:   Erythromycin            Itching    Social Hx    Smoking Status: Never  Smoker                      Smokeless Status: Never Used                        Alcohol Use: No                  Current Outpatient Prescriptions:  albuterol (PROVENTIL HFA,VENTOLIN HFA, PROAIR HFA) 108 (90 BASE) MCG/ACT inhaler Inhale 2 puffs into the lungs 2 (two) times daily. Disp:  Rfl:    albuterol (PROVENTIL HFA,VENTOLIN HFA, PROAIR HFA) 108 (90  BASE) MCG/ACT inhaler Inhale 2 puffs into the lungs as needed for Wheezing. Disp:  Rfl:    omeprazole (PRILOSEC) 40 MG capsule Take 40 mg by mouth daily. Disp:  Rfl:    Cetirizine HCl (ZYRTEC PO)  Disp:  Rfl:    cetirizine (ZYRTEC) 10 MG tablet Take 10 mg by mouth daily. Disp:  Rfl:      No current facility-administered medications for this visit.    Family Hx    Family History    Diabetes Mother     Hypertension Mother     Lipids Mother     Hypertension Father      No family history of colon cancer, IBD.    REVIEW OF SYSTEMS:    Cardiovascular:  No chest pain, palpitations, MI or heart failure.  Pulmonary:  No pneumonia, TB or asthma.    Neuro:  No stroke, seizure or loss of consciousness.    Endocrine:  No diabetes or thyroid disease.      Physical Exam:  Vital Signs: BP 145/78   Pulse 94   Temp(Src) 98.5 F (36.9 C)   Resp 16   Wt 264 lb (119.75 kg)   BMI 46.78 kg/m2   SpO2 97%   LMP 11/30/2012 Body mass index is 46.78 kg/(m^2).  General: Obese body habitus.   Pulmonary: Clear to auscultation and percussion. No rales, wheezes or rhonchi.  Cardiovascular: S1, S2, no S3, S4, clicks, rubs or murmurs.  JVD not elevated with patient sitting.  Abdominal: Normal bowel sounds. No tenderness on light or deep palpation. No hepatomegaly or splenomegaly.    Labs  Path:  SMALL BOWEL (DUODENUM THIRD PORTION), BIOPSY: - FRAGMENT OF PARTIALLY DENUDED SMALL BOWEL WITH MILD CONGESTION AND CHRONIC INFLAMMATION, CONSISTENT WITH MILD CHRONIC DUODENITIS. SMALL BOWEL (AMPULLA OF MODERATE), BIOPSY: -THERE IS NO EVIDENCE OF MALIGNANCY. - MINUTE SUPERFICIAL FRAGMENTS OF SMALL BOWEL MUCOSA,  FRAGMENTED, WITH SUPERFICIAL CONGESTION, HEMORRHAGE, FOCAL ACUTE AND CHRONIC INFLAMMATION, AND FOCAL GLANDULAR ATYPIA. SEE NOTE. Note: In one area, there is stratification and elongated nuclei, raising the possibility of adenomatous change. However, the change is very focal and there is hemorrhage and inflammation in the biopsy which could be elicit a reactive change. This minute superficial biopsy may not be representative of the clinically noted "extrinsic deformity" seen on endoscopy. If clinically indicated, additional clinical investigation is recommended to further characterize this lesion. Clinical correlation is required. This portion of the case has been reviewed at the Department Consensus Review Conference.   STOMACH (ANTRUM), BIOPSY: - MILD CHRONIC GASTRITIS. - NO H. PYLORI-LIKE ORGANISMS ARE IDENTIFIED ON DIFFQUIK STAIN.   STOMACH (BODY), BIOPSY: - MILD CHRONIC GASTRITIS WITH CONGESTION AND A RARE CYSTICALLY DILATED GLAND. - NO H. PYLORI-LIKE ORGANISMS ARE IDENTIFIED ON DIFFQUIK STAIN.    Impression:  Mass of ampulla of Vater  (primary encounter diagnosis)  GERD (gastroesophageal reflux disease)  Epigastric abdominal pain    Medical Decision Making:  The patient should have a follow up Upper GI endoscopy in 1 yrs. We also discussed the fact that she should come in sooner should she have any problems with her bowels. She knows that the Upper GI endoscopy done was not perfect and that we could have missed a lesion. She also knows that it is her responsibility to set up the follow up appointment.    The patient will also contact her blood relatives who are 40 and older. She will make sure that they have their colon screening exams done.    Over  15 minutes spent with the patient, more than half of which were spent in counseling.

## 2013-03-03 ENCOUNTER — Emergency Department (HOSPITAL_BASED_OUTPATIENT_CLINIC_OR_DEPARTMENT_OTHER)
Admission: RE | Admit: 2013-03-03 | Disposition: A | Discharge status: Home / Self Care | Payer: Self-pay | Source: Emergency Department | Attending: Emergency Medicine | Admitting: Emergency Medicine

## 2013-03-03 ENCOUNTER — Encounter (HOSPITAL_BASED_OUTPATIENT_CLINIC_OR_DEPARTMENT_OTHER): Payer: Self-pay

## 2013-03-03 MED ORDER — IBUPROFEN 600 MG PO TABS
600.00 mg | ORAL_TABLET | Freq: Once | ORAL | Status: AC
Start: 2013-03-03 — End: 2013-03-03
  Administered 2013-03-03: 600 mg via ORAL
  Filled 2013-03-03: qty 1

## 2013-03-03 MED ORDER — IBUPROFEN 600 MG PO TABS
600.00 mg | ORAL_TABLET | Freq: Three times a day (TID) | ORAL | Status: AC | PRN
Start: 2013-03-03 — End: 2013-03-13

## 2013-03-03 NOTE — Discharge Instructions (Signed)
Index Spanish version   Chest Pain, Noncardiac   What is noncardiac chest pain?   Chest pain is discomfort that is felt anywhere between your neck and belly button. Noncardiac chest pain is pain that is not caused by a heart problem. Because it is very important to determine the cause, always see your healthcare provider if you have chest pain.  How does it occur?   The most worrisome causes of chest pain are related to your heart. However, many causes of chest pain are not related to a heart problem. These include:  · swallowing disorders such as esophageal spasm, caused by the muscles of the lower esophagus squeezing painfully due to acid reflux or stress   · gastrointestinal disorders such as heartburn, which is stomach acid backing up into the esophagus   · lung disease such as bronchitis or pneumonia   · problems affecting the ribs and chest muscles such as muscle strain or inflammation of the rib joints or muscles   · anxiety or panic attacks   · inflammation of the sack around the heart (pericarditis) or of the lining of the lungs (pleuritis/pleurisy).   How is it diagnosed?   Keeping notes about your chest pain will help your healthcare provider make the diagnosis. Write down:  · what the pain feels like, such as stabbing, dull, or burning   · when it happens and how long it lasts   · where it hurts   · what makes it better or worse   · any other symptoms, such as nausea, vomiting, sweating, or trouble breathing.   Your provider will ask about your symptoms and medical history and examine you. You may have the following tests:  · electrocardiogram (ECG)   · exercise stress test   · echocardiogram (ultrasound scan of the heart)   · cardiac angiogram   · blood tests   · X-rays, such as an upper GI exam   · tests of your esophagus.   How is it treated?   After your provider has confirmed that the chest pain is not caused by a heart problem, he or she will recommend treatment for the problem that is causing  the pain.  When should I call my healthcare provider?   Tell your provider if your noncardiac chest pain is getting worse while you are using the treatment your provider recommends. You may need a different medicine or change in dosage, a different treatment, or more tests.  If you have new or different chest pain, call your healthcare provider or 911, or go to a hospital emergency room right away if:  · You have chest discomfort (pressure, fullness, squeezing, or pain) that lasts more than 5 minutes or goes away and comes back.   · You have chest discomfort with lightheadedness.   · Your chest pain goes beyond your chest to one or both arms or to your neck or jaw.   · You have chest discomfort and are sweating a lot or having trouble breathing or are sick to your stomach.   If you live in an area where there is no 911 or ambulance service, have someone drive you to the closest emergency room right away. You can also call the closest law enforcement agency (police, sheriff, or highway patrol) to help drive you to the emergency room.  Developed by [employer].  Adult Advisor 2012.1 published by [employer].  Last modified: 2009-08-09  Last reviewed: 2008-07-18  This content is reviewed periodically and   is subject to change as new health information becomes available. The information is intended to inform and educate and is not a replacement for medical evaluation, advice, diagnosis or treatment by a healthcare professional.  References   Adult Advisor 2012.1 Index   © 2012 [employer] and/or its affiliates. All rights reserved.

## 2013-03-03 NOTE — ED Notes (Signed)
Pt requesting dose of motrin prior to dc. MD notified. Orders rec'd.

## 2013-03-03 NOTE — ED Notes (Signed)
MD bedside for eval now. Awaiting plan of care.

## 2013-03-03 NOTE — ED Notes (Signed)
Patient Disposition    Patient education for diagnosis, medications, activity, diet and follow-up.  Patient left ED 11:10 PM.  Patient rep received written instructions.  Interpreter to provide instructions: No    Discharged to: Discharged to home with family    dc'd by Esaw Grandchild.

## 2013-03-03 NOTE — ED Notes (Signed)
Report rec'd and care assumed from Esaw Grandchild.

## 2013-03-03 NOTE — ED Notes (Signed)
MD bedside for re-eval. Awaiting plan of care.

## 2013-03-03 NOTE — ED Triage Note (Signed)
Pt arrives ambulatory to ed from [facility] clinic, pt reports chest pressure and sob x 2 days, resps even non labored, ls cta, pt denies n/v, no diaphoresis, no dizziness, nad at present, pt awaits eval

## 2013-03-03 NOTE — ED Notes (Signed)
To rad dept now

## 2013-03-03 NOTE — ED Provider Notes (Signed)
I have reviewed the ED nursing notes and prior records. I have reviewed the patient's past medical history/problem list, allergies, social history and medication list.  I saw this patient primarily.    HPI:  This 43 year old female patient presents with chief complaint of left-sided anterior chest pain for the past 2 days, started after patient was using her left arm a lot push and the doors and lifting things.  Pain increases with deep inspiration and movement.  His difficulty taking a deep breath because of pain.  Denies any fever or chills.  No cough.  Denies any diaphoresis.  Cardiac risk factors includes possible hyperlipidemia.  Denies any diabetes, hypertension, smoking or family history.  Had negative stress test 2 years ago.    ROS: Pertinent positives were reviewed as per the HPI above. All other systems were reviewed and are negative.    Past Medical History/Problem List:    Past Medical History    ACUTE GASTRITIS W/O HEMORRHAGE 06/12/2004    Comment: H. Pylori positive (done at [facility]); GI consult Dr. Phillis Knack, TOC 10/01/04    CLOSTRIDIUM DIFFICILE 07/26/2004    Comment: GI cosult: Dr. Rollene Rotunda, TOC done on 10/01/04    ALLERGY, UNSPECIFIED 03/26/2004    Comment: Seen by ENT, deviated septum, hypertrophy of inferior nasal turbinates    Esophageal reflux     Unspecified asthma(493.90)     Anxiety     Wears eyeglasses     Comment: night driving    Snores     Elevated cholesterol     Comment: resolved without meds    Stomach disease      Patient Active Problem List:     ALLERGY, UNSPECIFIED     ACUTE GASTRITIS W/O HEMORRHAGE     CLOSTRIDIUM DIFFICILE     PURE HYPERCHOLESTEROLEM     GENERALIZED ANXIETY DIS     Benign Positional Vertigo     Contraceptive Management     Tubular Adenoma     GERD (gastroesophageal reflux disease)     Diarrhea     Cholecystitis     Epigastric abdominal pain     Mass of ampulla of Vater      Past Surgical History:      Past Surgical History    NO SIGNIFICANT SURGICAL HISTORY       OB ANTEPARTUM CARE CESAREAN DLVR & POSTPARTUM      CHOLECYSTECTOMY         Medications:     No current facility-administered medications on file prior to encounter.  Current Outpatient Prescriptions on File Prior to Encounter:  albuterol (PROVENTIL HFA,VENTOLIN HFA, PROAIR HFA) 108 (90 BASE) MCG/ACT inhaler Inhale 2 puffs into the lungs 2 (two) times daily. Disp:  Rfl:    omeprazole (PRILOSEC) 40 MG capsule Take 40 mg by mouth daily. Disp:  Rfl:    Cetirizine HCl (ZYRTEC PO)  Disp:  Rfl:    albuterol (PROVENTIL HFA,VENTOLIN HFA, PROAIR HFA) 108 (90 BASE) MCG/ACT inhaler Inhale 2 puffs into the lungs as needed for Wheezing. Disp:  Rfl:    cetirizine (ZYRTEC) 10 MG tablet Take 10 mg by mouth daily. Disp:  Rfl:        Social History:     Smoking status: Never Smoker     Smokeless tobacco: Never Used    Alcohol Use: No       Allergies:  Review of Patient's Allergies indicates:   Erythromycin  Itching    Physical Exam:  BP 118/69  Pulse 84  Temp(Src) 98.3 F  Resp 16  Wt 115.214 kg (254 lb)  BMI 45.01 kg/m2  SpO2 98%  LMP 03/02/2013  GENERAL:  Well-appearing, no distress.  SKIN:  Warm & Dry, no rash, no bruising.  HEAD:  Atraumatic. PERRL, funduscopic exam unremarkable. Anicteric sclera.  Tympanic membranes clear bilaterally. Oropharynx clear without edema, erythema or exudate.  NECK:  Supple, no lymphadenopathy.  LUNGS:  Clear to auscultation bilaterally without rales, rhonchi or wheezing.  There is left anterior chest wall tenderness under the left breast reproducing patient's symptoms.    HEART:  RRR.  No murmurs, rubs, or gallops.   ABDOMEN:  Soft, flat, without distension.  Nontender to palpation.  Bowel sounds positive.  MUSCULOSKELETAL:  No deformities. Well-perfused extremities with  2+ DP/PT/Rad pulses bilaterally. No cyanosis or edema.  GENITOURINARY:  No CVA tenderness.   NEUROLOGIC:  Normal speech.  alert & oriented x 3, CNsII-XII intact. Gait normal.  PSYCHIATRIC:  Normal affect    ED Course  and Medical Decision-making:    Patient's electrocardiogram did not show any acute changes.    X-ray was unremarkable.    The patient is 43 year old female with musculoskeletal chest wall pain.  Patient states that her pain almost resolved after she received aspirin at the outpatient clinic.    The patient was given instructions for musculoskeletal chest pain, prescription for Motrin and was advised to followup with primary care physician as needed.    Reasons to return to the ED were reviewed in detail. The patient agrees with this plan and disposition.    Disposition: Discharged    Condition on  Discharge:  Improved and Stable    Diagnosis/Diagnoses:  Musculoskeletal chest wall pain    Deveron Furlong, MD      This record was generated in real time using voice recognition software. I proof-read and corrected any voice recognitions errors found. Please excuse any remaining voice recognition errors which were not detected.

## 2013-03-03 NOTE — ED Notes (Signed)
Bed: 07 (L)  Expected date:   Expected time:   Means of arrival:   Comments:  cp

## 2013-03-04 LAB — XR CHEST 2 VIEWS

## 2013-03-10 LAB — EKG

## 2013-08-09 ENCOUNTER — Ambulatory Visit (HOSPITAL_BASED_OUTPATIENT_CLINIC_OR_DEPARTMENT_OTHER): Payer: No Typology Code available for payment source | Admitting: Gastroenterology

## 2013-10-23 ENCOUNTER — Encounter (HOSPITAL_BASED_OUTPATIENT_CLINIC_OR_DEPARTMENT_OTHER): Payer: Self-pay

## 2013-10-23 ENCOUNTER — Emergency Department (HOSPITAL_BASED_OUTPATIENT_CLINIC_OR_DEPARTMENT_OTHER)
Admission: RE | Admit: 2013-10-23 | Disposition: A | Discharge status: Home / Self Care | Payer: Self-pay | Source: Emergency Department | Attending: Physician Assistant | Admitting: Physician Assistant

## 2013-10-23 MED ORDER — AMOXICILLIN 500 MG PO CAPS
500.00 mg | ORAL_CAPSULE | Freq: Three times a day (TID) | ORAL | Status: AC
Start: 2013-10-23 — End: 2013-11-02

## 2013-10-23 MED ORDER — FLUTICASONE PROPIONATE 50 MCG/ACT NA SUSP
1.00 | Freq: Every day | NASAL | Status: AC
Start: 2013-10-23 — End: 2013-10-30

## 2013-10-23 MED ORDER — IBUPROFEN 600 MG PO TABS
600.00 mg | ORAL_TABLET | Freq: Once | ORAL | Status: AC
Start: 2013-10-23 — End: 2013-10-23
  Administered 2013-10-23: 600 mg via ORAL
  Filled 2013-10-23: qty 1

## 2013-10-23 MED ORDER — AMOXICILLIN 250 MG PO CAPS
500.00 mg | ORAL_CAPSULE | Freq: Once | ORAL | Status: AC
Start: 2013-10-23 — End: 2013-10-23
  Administered 2013-10-23: 500 mg via ORAL
  Filled 2013-10-23: qty 2

## 2013-10-23 MED ORDER — IBUPROFEN 600 MG PO TABS
600.0000 mg | ORAL_TABLET | Freq: Three times a day (TID) | ORAL | Status: AC | PRN
Start: 2013-10-23 — End: 2013-10-30

## 2013-10-23 NOTE — ED Notes (Signed)
Bed: 04 Trauma  Expected date:   Expected time:   Means of arrival:   Comments:  HK

## 2013-10-23 NOTE — ED Triage Note (Signed)
43 y/o female presents to the ED with L sided facial pain, HA.No fevers. No rash. No NVD. VSS.

## 2013-10-23 NOTE — Discharge Instructions (Signed)
What we did in the Emergency Department (ED):  - Exam and History   -you were given Motrin and first dose of Amoxicillin    Next steps:  - Follow up with your regular doctor. Call tomorrow with update and arrange follow up visit.   - - Alternate doses of Tylenol (acetaminophen) with Motrin/Advil (ibuprofen) to control pain.  Wait 6 hours between the SAME kind of medication.  An example schedule follows:       6:00am   Motrin/Advil (ibuprofen)  600 MG as prescribed, TAKE WITH FOOD!     9:00am   Tylenol (acetaminophen)  975 MG = 3 REGULAR strength tablets     12:00noon    Motrin/Advil     3:00pm    Tylenol     6:00pm    Motrin/Advil     9:00pm    Tylenol    12:11midnight   Motrin/Advil  (NEVER MORE THAN THREE (3) DOSES TYLENOL in one day as too much Tylenol will poison your liver!!!)    -Amoxicillin as directed until finished  -Flonase as directed      Come back to the Emergency Department (ED) for:  Return to the emergency department with any worsening, any concerning symptoms or if unable to obtain follow-up. Worsening signs and symptoms include but are not limited to worsening pain or pain that changes location, fever, vomiting such that you cannot keep down liquids/medications, dizziness, confusion, fevers, chills, numbness, weakness, or any other new or concerning symptoms    Thank you for your patience.

## 2013-10-23 NOTE — ED Provider Notes (Signed)
eMERGENCY dEPARTMENT Physician Assistant NOTE    The ED nursing record was reviewed.   The prior medical records as available electronically through Epic were reviewed.  The mode of arrival was Self on 10/23/2013  8:50 PM.  Patient seen primarily by me.    CHIEF COMPLAINT    Patient presents with:  VERIFY CHIEF COMPLAINT: HEADACHE-ID NOT SEEN      HPI    Alejandra Martin is a 43 year old female presenting with c/o 3 days of sinus pressure, left side ear pressure and diminished hearing with some left side HA. No nausea, vomiting, dizziness, weakness, ear drainage, eye pain or vision change, neck pain or stiffness. Has tried claritin w/o relief. Denies any other complaints.       PAST MEDICAL HISTORY      Past Medical History    ACUTE GASTRITIS W/O HEMORRHAGE 06/12/2004    Comment: H. Pylori positive (done at [facility]); GI consult Dr. Phillis Knack, TOC 10/01/04    CLOSTRIDIUM DIFFICILE 07/26/2004    Comment: GI cosult: Dr. Rollene Rotunda, TOC done on 10/01/04    ALLERGY, UNSPECIFIED 03/26/2004    Comment: Seen by ENT, deviated septum, hypertrophy of inferior nasal turbinates    Esophageal reflux     Unspecified asthma(493.90)     Anxiety     Wears eyeglasses     Comment: night driving    Snores     Elevated cholesterol     Comment: resolved without meds    Stomach disease        PROBLEM LIST  Patient Active Problem List:     ALLERGY, UNSPECIFIED     ACUTE GASTRITIS W/O HEMORRHAGE     CLOSTRIDIUM DIFFICILE     PURE HYPERCHOLESTEROLEM     GENERALIZED ANXIETY DIS     Benign Positional Vertigo     Contraceptive Management     Tubular Adenoma     GERD (gastroesophageal reflux disease)     Diarrhea     Cholecystitis     Epigastric abdominal pain     Mass of ampulla of Vater      SURGICAL HISTORY        Past Surgical History    NO SIGNIFICANT SURGICAL HISTORY      OB ANTEPARTUM CARE CESAREAN DLVR & POSTPARTUM      CHOLECYSTECTOMY         CURRENT MEDICATIONS    1. albuterol (PROVENTIL HFA,VENTOLIN HFA, PROAIR HFA) 108 (90 BASE) MCG/ACT  inhaler  Route: Inhalation [id number] 2 puffs into the lungs 2 (two) times daily.  Dispense:  Refill:     2. albuterol (PROVENTIL HFA,VENTOLIN HFA, PROAIR HFA) 108 (90 BASE) MCG/ACT inhaler  Route: Inhalation VQQ:[id number] 2 puffs into the lungs as needed for Wheezing.  Dispense:  Refill:     3. omeprazole (PRILOSEC) 40 MG capsule  Route: Oral VFI:EPPI 40 mg by mouth daily.  Dispense:  Refill:     4. Cetirizine HCl (ZYRTEC PO)  Route:  Sig:  Dispense:  Refill:     5. cetirizine (ZYRTEC) 10 MG tablet  Route: Oral RJJ:OACZ 10 mg by mouth daily.  Dispense:  Refill:       ALLERGIES    Review of Patient's Allergies indicates:   Erythromycin            Itching    FAMILY HISTORY      Family History    Diabetes Mother     Hypertension Mother     Lipids Mother  Hypertension Father        SOCIAL HISTORY    Social History    Marital Status: Married             Spouse Name:                       Years of Education:                 Number of children:               Social History Main Topics    Smoking Status: Never Smoker                      Smokeless Status: Never Used                        Alcohol Use: No              Drug Use: No              Sexual Activity: Yes               Partners with: Female       Comment: men age 41 q 4-6 wks x 3d, small fibroid                 on u/s, 1st SA age 72, 2 lifetime                 partners, current x 8 yrs    Social History Narrative    07/09/2005    [name] family [place] age 47, husband from [place]    Works FT - [employer]    Pt thrilled with pregnancy-seeking pregnancy x 4 yrs, very anxious since sab 2 yrs ago        REVIEW OF SYSTEMS     The pertinent positives are reviewed in the HPI above. All other systems were reviewed and are negative.    PHYSICAL EXAM      Vital Signs: BP 132/87   Pulse 89   Temp(Src) 99 F   Resp 16   Wt 127.007 kg (280 lb)   SpO2 100%   LMP  (LMP Unknown)     Constitutional: Well-developed, Well-nourished,  Non-toxic appearance. Speaking full sentences.  HEAD: Without signs of trauma.   NECK: No C-spine tenderness; No tenderness, swelling, or step-off. Full range of motion without discomfort. Supple with no meningismus.   EYES: Pupils are equal and reactive. Extraocular movements are intact. No scleral icterus.   ENT:  Oropharynx clear, airway patent. Mucus membranes are moist. EACs clear, no pre or post auricular tenderness. Left TM opaque and erythematous, right TM clear.   LYMPHATICS: No palpable cervical lymphadenopathy.   CV: RRR, No MRG, radial pulses 2+ B/L  PULMONARY:  CTAB, No WRR, No stridor, accessory muscle use or tripoding  ABDOMINAL: Soft, NTND, No palpable organomegaly or mass.  MUSCULOSKELETAL : Moving all 4 extremities. Ambulatory w/ a steady gait.   SKIN: Warm and dry, no rash . The skin color and turgor are normal.   NEUROLOGIC: Normal mental status. Cranial nerves, motor, sensor, and cerebellar function are grossly intact.  PSYCHIATRIC: Normal affect      RESULTS  No results found for this visit on 10/23/13 (from the past 24 hour(s)).         MEDICATIONS ADMINISTERED ON THIS  VISIT  No orders of the defined types were placed in this encounter.       ED COURSE & MEDICAL DECISION MAKING      I reviewed the patient's past medical history/problem list, past surgical history, medication list, social history and allergies.    ED Decision Making & Course: Pt is a 43 year old female with above exam and HPI. Impression is an AOM on left side and acute sinusitis.   Pt is started on motrin, flonase and amox.   To f/u with PCP and return as needed.  Patient given discharge instructions including return precautions. They express understanding and agreement with the plan of care all questions answered here.     Diagnosis: Acute nonintractable headache, unspecified headache type  (primary encounter diagnosis)  Acute left otitis media, recurrence not specified, unspecified otitis media type  Acute sinusitis,  recurrence not specified, unspecified location    Disposition: Discharged home in stable condition    Cyril Loosen, [place]  Department of Emergency Medicine      This record was generated in real time using voice recognition software. I proof-read and corrected any voice recognitions errors found. Please excuse any remaining voice recognition errors which were not detected.

## 2013-10-23 NOTE — Narrator Note (Signed)
Patient Disposition    Patient education for diagnosis, medications, activity, diet and follow-up.  Patient left ED 10:40 PM.  Patient rep received written instructions.  Interpreter to provide instructions: No    Have all existing LDAs been addressed? N/A    Have all IV infusions been stopped? N/A    Discharged to: Discharged to home ambulatory with dc instructions on f/u,home care,and prescribed meds. Pt verbalizes understanding.

## 2013-11-06 ENCOUNTER — Emergency Department (HOSPITAL_BASED_OUTPATIENT_CLINIC_OR_DEPARTMENT_OTHER)
Admission: RE | Admit: 2013-11-06 | Disposition: A | Discharge status: Home / Self Care | Payer: Self-pay | Source: Emergency Department | Attending: Physician Assistant | Admitting: Physician Assistant

## 2013-11-06 ENCOUNTER — Encounter (HOSPITAL_BASED_OUTPATIENT_CLINIC_OR_DEPARTMENT_OTHER): Payer: Self-pay

## 2013-11-06 LAB — URINALYSIS
BILIRUBIN, URINE: NEGATIVE
CASTS: NONE SEEN PER LPF
CRYSTALS: NONE SEEN
GLUCOSE, URINE: NEGATIVE MG/DL
KETONE, URINE: NEGATIVE MG/DL
NITRITE, URINE: POSITIVE — AB
PH URINE: 8 (ref 5.0–8.0)
PROTEIN, URINE: >=300 MG/DL — AB
SPECIFIC GRAVITY URINE: 1.02 (ref 1.003–1.035)
SQUAMOUS EPITHELIAL CELLS: >10 PER LPF — AB (ref 0–4)

## 2013-11-06 LAB — URINE PREGNANCY TEST (POINT OF CARE): HCG QUALITATIVE URINE: NEGATIVE

## 2013-11-06 MED ORDER — PHENAZOPYRIDINE HCL 100 MG PO TABS
200.00 mg | ORAL_TABLET | Freq: Once | ORAL | Status: AC
Start: 2013-11-06 — End: 2013-11-06
  Administered 2013-11-06: 200 mg via ORAL
  Filled 2013-11-06: qty 2

## 2013-11-06 MED ORDER — NITROFURANTOIN MACROCRYSTAL 50 MG PO CAPS
100.00 mg | ORAL_CAPSULE | Freq: Once | ORAL | Status: AC
Start: 2013-11-06 — End: 2013-11-06
  Administered 2013-11-06: 100 mg via ORAL
  Filled 2013-11-06: qty 2

## 2013-11-06 MED ORDER — NITROFURANTOIN MONOHYD MACRO 100 MG PO CAPS
100.00 mg | ORAL_CAPSULE | Freq: Two times a day (BID) | ORAL | Status: AC
Start: 2013-11-06 — End: 2013-11-11

## 2013-11-06 MED ORDER — PHENAZOPYRIDINE HCL 100 MG PO TABS
100.00 mg | ORAL_TABLET | Freq: Three times a day (TID) | ORAL | Status: AC | PRN
Start: 2013-11-06 — End: 2013-11-09

## 2013-11-06 NOTE — ED Triage Note (Signed)
Presents with bladder pressure, painful urination.  Pt also reports hematuria but may be menstruation.  Pt denies fever, flank pain.

## 2013-11-06 NOTE — Discharge Instructions (Signed)
DIAGNOSIS & TREATMENT:  You were seen in a [facility] Emergency Department for a urinary tract infection.     Take nitrofurantoin as prescribed until finished.    Take Pyridium as needed for pain with urination.  This will turn your urine orange-colored which is normal.  A dose of each of these was given in the emergency department today.    TEST RESULTS:  Your urine was checked and it does show an infection.      FURTHER CARE:  Drink plenty of fluids to help wash out the infection.      WHEN SHOULD YOU BE SEEN NEXT?  Call your primary care provider for a follow up as needed.  If you do not have a primary care doctor or would like to transfer your primary care to [facility], please call [phone] Mon-Fri 9AM-5PM to set up an appointment.      WHEN SHOULD YOU RETURN TO THE ED?  Please return to the emergency room if you develop fever, are vomiting and cannot keep the medicine down, have severe pain, or any other worsening or concerning symptoms.

## 2013-11-06 NOTE — Narrator Note (Signed)
Patient Disposition    Patient education for diagnosis, medications, activity, diet and follow-up.  Patient left ED 10:16 PM.  Patient rep received written instructions.  Interpreter to provide instructions: No    Patient belongings with patient: YES    Have all existing LDAs been addressed? N/A    Have all IV infusions been stopped? N/A    Discharged to: Discharged to home accepting of instructions

## 2013-11-11 NOTE — ED Provider Notes (Signed)
EMERGENCY DEPARTMENT ENCOUNTER      The patient was seen primarily by me. ED nursing record was reviewed. Prior records as available electronically through the Epic record were reviewed.      HPI:    This 43 year old female patient presents to the Emergency Department with chief complaint of bladder pressure and painful urination.  Symptoms have started today.  Reports hematuria, but may be getting her menstrual period today.  No nausea, vomiting, fever or flank pain.  No vaginal discharge.      ROS: See HPI for further details. Review of systems otherwise negative.       Past Medical History/Problem list:    Past Medical History    ACUTE GASTRITIS W/O HEMORRHAGE 06/12/2004    Comment: H. Pylori positive (done at [facility]); GI consult Dr. Phillis Knack, TOC 10/01/04    CLOSTRIDIUM DIFFICILE 07/26/2004    Comment: GI cosult: Dr. Rollene Rotunda, TOC done on 10/01/04    ALLERGY, UNSPECIFIED 03/26/2004    Comment: Seen by ENT, deviated septum, hypertrophy of inferior nasal turbinates    Esophageal reflux     Unspecified asthma(493.90)     Anxiety     Wears eyeglasses     Comment: night driving    Snores     Elevated cholesterol     Comment: resolved without meds    Stomach disease      Patient Active Problem List:     ALLERGY, UNSPECIFIED     ACUTE GASTRITIS W/O HEMORRHAGE     CLOSTRIDIUM DIFFICILE     PURE HYPERCHOLESTEROLEM     GENERALIZED ANXIETY DIS     Benign Positional Vertigo     Contraceptive Management     Tubular Adenoma     GERD (gastroesophageal reflux disease)     Diarrhea     Cholecystitis     Epigastric abdominal pain     Mass of ampulla of Vater        Past Surgical History:     Past Surgical History    NO SIGNIFICANT SURGICAL HISTORY      OB ANTEPARTUM CARE CESAREAN DLVR & POSTPARTUM      CHOLECYSTECTOMY           Medications:   No current facility-administered medications on file prior to encounter.  Current Outpatient Prescriptions on File Prior to Encounter:  albuterol (PROVENTIL HFA,VENTOLIN HFA, PROAIR HFA) 108  (90 BASE) MCG/ACT inhaler Inhale 2 puffs into the lungs 2 (two) times daily. Disp:  Rfl:    omeprazole (PRILOSEC) 40 MG capsule Take 40 mg by mouth daily. Disp:  Rfl:    cetirizine (ZYRTEC) 10 MG tablet Take 10 mg by mouth daily. Disp:  Rfl:    albuterol (PROVENTIL HFA,VENTOLIN HFA, PROAIR HFA) 108 (90 BASE) MCG/ACT inhaler Inhale 2 puffs into the lungs as needed for Wheezing. Disp:  Rfl:    Cetirizine HCl (ZYRTEC PO)  Disp:  Rfl:          Social History:   Social History   Marital Status: Married  Spouse Name: N/A    Years of Education: N/A  Number of Children: N/A     Occupational History  None on file     Social History Main Topics   Smoking status: Never Smoker     Smokeless tobacco: Never Used    Alcohol Use: No    Drug Use: No    Sexual Activity: Yes    Partners: Male    Comment: men age 37 q  4-6 wks x 3d, small fibroid on u/s, 1st SA age 76, 2 lifetime partners, current x 8 yrs     Other Topics Concern   None on file     Social History Narrative    07/09/2005    [name] family [place] age 65, husband from [place]    Works FT - [employer]    Pt thrilled with pregnancy-seeking pregnancy x 4 yrs, very anxious since sab 2 yrs ago         Allergies:  Review of Patient's Allergies indicates:   Erythromycin            Itching      Physical Exam:  BP 173/81 mmHg   Pulse 97   Temp(Src) 99 F   Resp 16   Wt 124.739 kg (275 lb)   SpO2 98%   LMP 11/06/2013    GENERAL: No acute distress, non-toxic.   SKIN:  Warm & Dry, no rash.  LUNGS:  Clear to auscultation bilaterally.   HEART:  RRR.   ABDOMEN:  Soft, NTND, no rebound    GENITOURINARY: Positive suprapubic tenderness, negative costovertebral angle tenderness.  NEUROLOGIC:  Alert and oriented x3    ED Course and Medical Decision-making:  43 year old female patient presents to the emergency department with symptoms and physical exam findings consistent with urinary tract infection.  Patient has evidence of pyuria on urinalysis  and is nitrite positive.  She will be treated with nitrofurantoin and Pyridium.  A dose of each medication was given in the emergency department.    Followup with PCP as needed.    Reasons to return to the Emergency Department were reviewed in detail.  Patient agrees with plan and disposition, patient was discharged home in stable condition.      Condition: Stable  Disposition: Home      Diagnosis/Diagnoses:  Acute UTI (urinary tract infection)  (primary encounter diagnosis)      Cleta Alberts, 11/11/2013 1:41 AM  Dept.of Emergency Medicine  [facility]      This Emergency Department patient encounter note was created using voice-recognition software and in real time during the ED visit. Please excuse any typographical errors that have not been edited out.

## 2014-03-16 ENCOUNTER — Ambulatory Visit (HOSPITAL_BASED_OUTPATIENT_CLINIC_OR_DEPARTMENT_OTHER): Payer: No Typology Code available for payment source | Admitting: Gastroenterology

## 2014-03-16 VITALS — BP 149/84 | HR 95 | Temp 98.2°F | Resp 16 | Wt 275.0 lb

## 2014-03-16 DIAGNOSIS — K838 Other specified diseases of biliary tract: Secondary | ICD-10-CM

## 2014-03-16 DIAGNOSIS — K219 Gastro-esophageal reflux disease without esophagitis: Secondary | ICD-10-CM

## 2014-03-16 DIAGNOSIS — D369 Benign neoplasm, unspecified site: Secondary | ICD-10-CM

## 2014-03-16 DIAGNOSIS — K839 Disease of biliary tract, unspecified: Secondary | ICD-10-CM

## 2014-03-16 MED ORDER — PEG 3350-KCL-NA BICARB-NACL 420 G PO SOLR
ORAL | Status: AC
Start: 2014-03-16 — End: 2014-05-15

## 2014-03-16 NOTE — Progress Notes (Signed)
This 44 year old English speaking patient presents for follow up of her history of colon polyps and the abnormal mucosa seen on her ampulla of Vater.    The patient is due for a follow up colonoscopy given her history of neoplastic polyps. She had an EGD done on 01/05/13 which showed a small mucosal change in the ampulla.     The patient denied abdominal pain, heartburn, regurgitation, dysphagia, odynophagia, early satiety, weight loss, nausea, vomiting, hematemesis, hematochezia or jaundice.    The patient now presents in follow up.    Past Medical History  Patient Active Problem List:     Allergy, unspecified not elsewhere classified     Acute gastritis without mention of hemorrhage     CLOSTRIDIUM DIFFICILE     PURE HYPERCHOLESTEROLEM     GENERALIZED ANXIETY DIS     Benign Positional Vertigo     Contraceptive Management     Tubular Adenoma     GERD (gastroesophageal reflux disease)     Diarrhea     Cholecystitis     Epigastric abdominal pain     Mass of ampulla of Vater          Past Surgical History    NO SIGNIFICANT SURGICAL HISTORY      OB ANTEPARTUM CARE CESAREAN DLVR & POSTPARTUM      CHOLECYSTECTOMY         Review of Patient's Allergies indicates:   Erythromycin            Itching    Social Hx    Smoking Status: Never Smoker                      Smokeless Status: Never Used                        Alcohol Use: No                  Current Outpatient Prescriptions:  albuterol (PROVENTIL HFA,VENTOLIN HFA, PROAIR HFA) 108 (90 BASE) MCG/ACT inhaler Inhale 2 puffs into the lungs 2 (two) times daily. Disp:  Rfl:    albuterol (PROVENTIL HFA,VENTOLIN HFA, PROAIR HFA) 108 (90 BASE) MCG/ACT inhaler Inhale 2 puffs into the lungs as needed for Wheezing. Disp:  Rfl:    omeprazole (PRILOSEC) 40 MG capsule Take 40 mg by mouth daily. Disp:  Rfl:    Cetirizine HCl (ZYRTEC PO)  Disp:  Rfl:    cetirizine (ZYRTEC) 10 MG tablet Take 10 mg by mouth daily. Disp:  Rfl:      No current facility-administered medications  for this visit.    Family Hx    Family History    Diabetes Mother     Hypertension Mother     Lipids Mother     Hypertension Father          REVIEW OF SYSTEMS:    Cardiovascular:  No chest pain, palpitations, MI, heart failure,  Pulmonary:  No TB or asthma.  She had pneumonia.   Neuro:  No stroke, seizure or loss of consciousness.    Endocrine:  No diabetes or thyroid disease.      Physical Exam:  Vital Signs: BP 149/84 mmHg   Pulse 95   Temp(Src) 98.2 F (36.8 C)   Resp 16   Wt 124.739 kg (275 lb)   SpO2 98% Body mass index is 48.73 kg/(m^2).  General: Obese body habitus.   Pulmonary: Clear to auscultation and  percussion. No rales, wheezes or rhonchi.  Cardiovascular: S1, S2, no S3 or murmurs.    Abdominal: Normal bowel sounds. No tenderness on light or deep palpation. No hepatomegaly or splenomegaly.  Neurological: Sensation intact. Normal motor function. Normal gait. Alert and oriented x 3    Impression:  Tubular adenoma  (primary encounter diagnosis)  Gastroesophageal reflux disease, esophagitis presence not specified  Mass of ampulla of Vater    Medical Decision Making:  The patient is doing well. She is anxious about her ampulla and her history of colon polyps. We will check a colonoscopy and EGD. The patient was informed of the risk of bleeding, discomfort, perforation, a need for surgery, infection, drug reaction, cardiovascular and cerebrovascular compromise and the possibility of missing a lesion or problem.  She understood these risks and consented to the exam.  She does not have sleep apnea but given her weight we will have anesthesia support.    The patient sees a primary care provider outside of [place]. She was asked to provide copies of her record for any major health events.    Reviewed with patient who agrees with our plan.

## 2014-06-02 DIAGNOSIS — J4520 Mild intermittent asthma, uncomplicated: Secondary | ICD-10-CM | POA: Insufficient documentation

## 2014-06-02 DIAGNOSIS — R7303 Prediabetes: Secondary | ICD-10-CM | POA: Insufficient documentation

## 2014-06-06 ENCOUNTER — Telehealth (HOSPITAL_BASED_OUTPATIENT_CLINIC_OR_DEPARTMENT_OTHER): Payer: Self-pay

## 2014-06-06 NOTE — Progress Notes (Signed)
patient called to review prep instructions and stated she was not able to tolerate nulytely. Miralax/ducolax prep reviewed with patient with agood understanding.  Npo and ride policy discussed.          Miralax and Dulcolax Bowel Prep     Division of Gastroenterology  [facility]  Phone:  Address: [address] [place], [place] [address]    PLEASE READ CAREFULLY OR HAVE SOMEONE READ THIS TO YOU!    ____________________________________________  Name    Your Colonoscopy test is scheduled at }      Registration on       You are having a colonoscopy.  This test lets the doctor see the inside your large intestine (also called your colon) using a small camera on a flexible tube.  There are several reasons to have the test:   Screening for colon cancers and polyps    To check unexplained changes in bowel habits   To evaluate abnormal X-ray findings   To look for bleeding ulcers or other abnormalities of the colon lining                HOW TO GET READY FOR THE TEST    You will need to purchase the following items from a pharmacy       ONE BOTTLE OF MIRALAX (8.3 OUNCE OR 238 GRAMS)   4 DULCOLAX OR BISACODYL TABLETS ( 5 MILLIGRAM TABLETS)   A 64 OUNCE BOTTLE OF SPORTS DRINK SUCH AS GATORADE-IF YOU HAVE DIABETES, BUY A NO OR LOW CAROLIE DRINK SUCH AS CRYSTAL LIGHT INSTEAD. (YOU WILL USE THIS TO MIX YOUR MIRALAX THE DAY BEFORE THE TEST. NO CARBONATED BEVERAGES)      Call a friend or a family member to make sure you have   someone to take you home after your test.   You must have someone to go home with after the test or we will not be able to give you sedatives!    If you have any questions or worries, or if you are unsure about how to prepare for this test, please call                         Day of the test      The morning of the test, you can take your other medications at the usual time with a sip of water. These include blood pressure pills, seizure medications, heart medications, thyroid  medications, etc).     Dont take pills for diabetes. Bring these medicines with you so that you can take them right after your test.      NOTHING TO DRINK FOR 3 HOURS BEFORE YOU COME TO HOSPITAL                                IMPORTANT INFORMATION About Your Medicines  Based On Recommendations From Experts    IF YOU HAVE ANY CONCERNS ABOUT YOUR MEDICATIONS ASK YOUR OWN DOCTOR. DO NOT WAIT UNTIL THE DAY BEFORE THE TEST!!!!!    Your Medications   You can take your medications for high blood pressure, heart problems, or anxiety with a sip of water at your usual time.   Bring a list of your medications with you.           If you take medicines for Type 2 Diabetes:    ? One day before the test:  If you are taking diabetes pills: Take PILLS in the morning only. If you take morning insulin: take your usual dose.    ? On the night before the test: Do not take diabetes pills.  If you take insulin for type 2 diabetes, take  your usual long acting insulin.  For example: if you usually take 40 units of Lantus or NPH, take 20 units instead.    ? On the morning of the test: Do not take diabetes pills. If you are on long-acting insulin for type 2 diabetes, you can take half the dose. For example if you are on 40 units of Lantus or NPH, take 20 units instead.     ? After the test: You will be allowed to eat normally again. At that time, you should resume taking your diabetes pills at your usual times. If on insulin, take your usual evening dose of insulin. If you cant eat for any reason, check with your doctor.    If you have Type 1 Diabetes:    ? One day before the test: Take your usual long-acting basal insulin (Lantus or NPH) and make sure your clear liquid diets contain sugar. Check your blood sugars at meal times and cover with short acting insulin only if blood sugar is over 200. Otherwise do not take your short-acting insulin.    ? On the night before the test: Just take your usual basal insulin dose that you would  normally take at that time (for example, your usual full dose of Lantus or NPH).    ? On the morning of the test: Take your usual basal insulin dose that you would normally take at that time (for example, your usual full dose of Lantus or NPH).    Also note    ? Tylenol (Acetaminophen) is ok for moderate pain relief.     ? If you are on blood thinners (such as Aspirin, Warfarin, Coumadin, Heparin or Plavix) you must ask your doctor or nurse before stopping these medications!!! This important if you take your medicines for heart disease, stroke disease, a pulmonary embolus, artificial heart valves, a heart stent or a vascular stent and any other problems that could lead to a serious clot forming, stroke or heart attack.          Test Checklist  Please use this list to prepare for your test.   -------------------------------------------------------------------------------------------------------------------  7 days before the test      STOP taking Motrin, Advil, Naprosyn, Aleve or related drugs.   Continue to take all other prescribed meds.   Ask your doctor about whether you should continue Aspirin. It is sometimes important to stay on this medication.  -------------------------------------------------------------------------------------------------------------------  3 days before the test      STOP eating any foods that contain beans, seeds, skins, nuts, or are high in fiber.  -------------------------------------------------------------------------------------------------------------------  2 days before the test    MAKE SURE YOU HAVE A RIDE ARRANGED    EAT dinner no later than 7 pm.  BEGIN a liquid diet. (Do not eat solids. This includes foods such as rice ,pasta, bread and fruit).  -------------------------------------------------------------------------------------------------------------------  1 day before the test     PREPARE the laxative.   START taking the Dulcolax tablets at 1 PM.   DRINK the Miralax  solution at 4PM  -------------------------------------------------------------------------------------------------------------------  Day of your test                    DO NOT drink anything  3 hours before the test.    WHAT TO EXPECT ON THE DAY OF YOUR TEST    Colonoscopy    Colonoscopy is a procedure used to see inside the colon and rectum.  During a colonoscopy a flexible tube with a camera and a light is inserted through the rectum. The doctor then examines the large bowel (also called the large intestine or colon).    Colonoscopies can detect inflamed tissue, ulcers and abnormal growths called polyps. Some polyps are cancerous, but most are pre cancerous. These might become dangerous someday. A doctor can usually remove these polyps during the test. They are then sent to a laboratory to be checked. Polyps are common in adults and are generally harmless. However, most colorectal cancers begin as polyps. Removing them is a good way to prevent cancer. Your doctor may also take biopsies (small pieces of tissue) for analysis in the lab of abnormalities that they want to check in more detail.    It is extremely important to follow all of the steps for cleaning out your colon. If you do not follow all the steps, the doctor may not be able to see clearly. The exam might need to be cancelled and repeated another day. The clear liquids you can take during the clean out are treated as food by your body, so you wont starve or get dehydrated.        Getting Ready At [facility]    On the day of your test, a nurse and doctor will ask you some more information about your health history.  You will be put into a hospital gown. A small intravenous needle will be inserted in the back of your hand or forearm to give you medications that will make you comfortable during the test.    In the procedure room, you will be asked to lie down in a curled position on your left side.  Please inform the doctor if this is uncomfortable for  you. You will be given oxygen to breathe. The test usually lasts 30 minutes to an hour. It may last longer if polyps need to be removed or if other abnormalities are noted.      You will be given medication through the IV in order to control discomfort and help you relax.  You may sleep or be partially awake during the test. Sometimes, you might feel a cramping sensation. We will monitor you and try to make you as comfortable as possible. The tube will be inserted into your rectum (back side) and advanced through the large bowel.  The doctor will try and look at all of the inner walls of your colon. The outer wall and organs outside the colon are not visible with this test.    Possible Complications    Complications are unusual during or after the test but they can happen. The most common risks include colonic perforation (a tear in the colon), bleeding, respiratory problems, blood pressure problems, heart problems, discomfort and adverse reactions to the medications used.  A perforation may result in the need for emergency surgery and a colostomy bag. Also note, colonoscopy like other medical tests is not perfect. It may not detect problems such as polyps, cancers and other diseases up to 2 to 6% of the time. Luckily, the combined risk of all of these problems is small.    After the Test    You may feel bloated from air which was put into your colon during the test.  You may also feel a little drowsy from the medications. You cannot drive or operate heavy machinery or do any important work for the rest of the day. You should plan on resting, watching TV or reading light material after the test. You may forget things that happen during and directly after the test. It is important have someone with you that can remind you of any instructions we give.    Depending on what is found, you may need to have the colonoscopy repeated. Usually, this is done several years later but it may need to be done much sooner. Talk to  your doctor about when you should have repeat study or other testing.        You will usually be at the hospital between 2-3 hours (although sometimes it can take longer).  We will make sure that you are alright before sending you home. You must arrange for someone to drive you home after the test.  Once again, we will not perform the test unless you have an arranged ride. You cannot go home in a taxi or a bus.    Colonoscopy is a safe and effective test that is commonly done at our facilities. You may receive a call to remind you of the date and time of your test. If you have any questions, please feel free to call.     For questions about the test itself, call [phone].    For questions about the date and time of your test, or to change the date or time, call [phone]    For questions regarding your regular medications or health issues, please call your doctor.

## 2014-06-07 ENCOUNTER — Ambulatory Visit (HOSPITAL_BASED_OUTPATIENT_CLINIC_OR_DEPARTMENT_OTHER)
Admit: 2014-06-07 | Disposition: A | Discharge status: Home / Self Care | Payer: Self-pay | Source: Ambulatory Visit | Attending: Gastroenterology | Admitting: Gastroenterology

## 2014-06-07 DIAGNOSIS — K319 Disease of stomach and duodenum, unspecified: Secondary | ICD-10-CM

## 2014-06-07 DIAGNOSIS — K2100 Gastro-esophageal reflux disease with esophagitis, without bleeding: Secondary | ICD-10-CM

## 2014-06-07 DIAGNOSIS — K635 Polyp of colon: Secondary | ICD-10-CM

## 2014-06-07 DIAGNOSIS — E669 Obesity, unspecified: Secondary | ICD-10-CM

## 2014-06-07 MED ORDER — LACTATED RINGERS IV SOLN
INTRAVENOUS | Status: DC
Start: 2014-06-07 — End: 2014-06-07

## 2014-06-07 MED ORDER — LIDOCAINE HCL (PF) 2 % IJ SOLN
INTRAMUSCULAR | Status: AC
Start: 2014-06-07 — End: 2014-06-07
  Filled 2014-06-07: qty 5

## 2014-06-07 MED ORDER — PROPOFOL 10 MG/ML IV EMUL
INTRAVENOUS | Status: AC
Start: 2014-06-07 — End: 2014-06-07
  Filled 2014-06-07: qty 60

## 2014-06-07 NOTE — PROVATION-GI (Signed)
[facility]  Patient Name: Alejandra Martin  MRN: [id number]  Account Number: [id number]  Gender: Female  Age: 44  Date of Birth: 1971/01/10  Admit Type: Outpatient  Patient Location: [facility]  Note Status: Finalized  Multiple Proc?: Also had an EGD today.  Preparation: Go [facility] [place] / [place] [facility]  Referring MD:         Lucio Edward, MD  Procedure Date:       06/07/2014 9:43:13 AM  Procedure:            Colonoscopy  Endoscopist:          Lucio Edward, MD  Indications for Procedure:       Surveillance: Personal history of colonic polyps (unknown histology) on last        colonoscopy 5 years ago  Medications:          Monitored Anesthesia Care  Procedure:       Pre-Anesthesia Assessment:       - See the other procedure note for documentation of the pre-procedure        assessment.       Just prior to the procedure, an updated history and physical was done. I        obtained an informed consent from the patient reviewing the risk of the        procedure including (but not limited to) respiratory depression, perforation,        bleeding, discomfort, a possible need for surgery and unexpected reactions to        medications. The patient is aware that test has limitations and may not        detect significant lesions such as cancer or other potential diseases. The        patient was also informed that they might need a repeat colonoscopy earlier        than standard guidelines if there are changes in their symptoms or concerning        findings noted. A time out was performed with the entire procedure staff        present. The scope was passed under direct vision. Throughout the procedure,        the patient's blood pressure, pulse, and oxygen saturations were monitored        continuously. The [id number] was introduced through the anus and        advanced to 8 cm into the ileum. The scope was then slowly withdrawn with        confirmation of the noted findings. The colonoscopy was performed without         difficulty. The patient tolerated the procedure well. The quality of the        bowel preparation was good. Scope withdrawal time was 8 minutes. The total        duration of the procedure was 12 minutes.  Findings:       The perianal and digital rectal examinations were normal. Pertinent negatives        include normal sphincter tone.       Multiple small and large-mouthed diverticula were found in the sigmoid colon        and in the descending colon.       The terminal ileum appeared normal.       A 3 mm polyp was found in the transverse colon. The polyp was sessile. The        polyp was removed with a cold biopsy forceps.  Resection and retrieval were        complete. Estimated blood loss was minimal.       The retroflexed view of the distal rectum and anal verge was normal and        showed no anal or rectal abnormalities.       The exam was otherwise without abnormality.  Post Procedure Diagnosis:       - Diverticulosis in the sigmoid colon and in the descending colon.       - The examined portion of the ileum was normal.       - One 3 mm polyp in the transverse colon. Resected and retrieved.       - The distal rectum and anal verge are normal on retroflexion view.       - The examination was otherwise normal.  Complications:        No immediate complications. Estimated blood loss: Minimal.  Recommendation:       - Patient has a contact number available for emergencies. The signs and        symptoms of potential delayed complications were discussed with the patient.        Return to normal activities tomorrow. Written discharge instructions were        provided to the patient.       - Reflux diet and Weight loss diet indefinitely.       - Continue present medications.       - Await pathology results.       - Return to my office 8 to 12 weeks.  Lucio Edward, MD  06/07/2014 10:45 AM  This report has been signed electronically.  Number of Addenda: 0  Note Initiated On: 06/07/2014 9:43 AM

## 2014-06-07 NOTE — H&P (Signed)
GI Pre-procedure History and Physical Short Form  Alejandra Martin is an 44 year old female.    Chief Complaint: She presents for Both upper GI endoscopy and colonoscopy    HPI:   This 44 year old English speaking patient presents for follow up of her history of colon polyps and the abnormal mucosa seen on her ampulla of Vater.    The patient is due for a follow up colonoscopy given her history of neoplastic polyps. She had an EGD done on 01/05/13 which showed a small mucosal change in the ampulla.     The patient denied abdominal pain, heartburn, regurgitation, dysphagia, odynophagia, early satiety, weight loss, nausea, vomiting, hematemesis, hematochezia or jaundice.    The patient now presents in follow up.      Active Problems:  Patient Active Problem List:     Allergy, unspecified not elsewhere classified     Acute gastritis without mention of hemorrhage     CLOSTRIDIUM DIFFICILE     PURE HYPERCHOLESTEROLEM     GENERALIZED ANXIETY DIS     Benign Positional Vertigo     Contraceptive Management     Tubular Adenoma     GERD (gastroesophageal reflux disease)     Diarrhea     Cholecystitis     Epigastric abdominal pain     Mass of ampulla of Vater      Past Medical History:     Past Medical History    ACUTE GASTRITIS W/O HEMORRHAGE 06/12/2004    Comment: H. Pylori positive (done at [facility]); GI consult Dr. Phillis Knack, TOC 10/01/04    CLOSTRIDIUM DIFFICILE 07/26/2004    Comment: GI cosult: Dr. Rollene Rotunda, TOC done on 10/01/04    ALLERGY, UNSPECIFIED 03/26/2004    Comment: Seen by ENT, deviated septum, hypertrophy of inferior nasal turbinates    Esophageal reflux     Unspecified asthma(493.90)     Anxiety     Wears eyeglasses     Comment: night driving    Snores     Elevated cholesterol     Comment: resolved without meds    Stomach disease        Past Surgical History:      Past Surgical History    NO SIGNIFICANT SURGICAL HISTORY      OB ANTEPARTUM CARE CESAREAN DLVR & POSTPARTUM      CHOLECYSTECTOMY         Social  History:    Social History   Marital Status: Married  Spouse Name: N/A    Years of Education: N/A  Number of Children: N/A     Occupational History  None on file     Social History Main Topics   Smoking status: Never Smoker     Smokeless tobacco: Never Used    Alcohol Use: No    Drug Use: No    Sexual Activity: Yes    Partners: Male    Comment: men age 48 q 4-6 wks x 3d, small fibroid on u/s, 1st SA age 89, 2 lifetime partners, current x 8 yrs     Other Topics Concern   None on file     Social History Narrative    07/09/2005    [name] family at age 17, husband from [place]    Works FT - [employer]    Pt thrilled with pregnancy-seeking pregnancy x 4 yrs, very anxious since sab 2 yrs ago  Family History:    Family History    Diabetes Mother     Hypertension Mother     Lipids Mother     Hypertension Father        Allergies:   Review of Patient's Allergies indicates:   Erythromycin            Itching    Medications:     Current Outpatient Prescriptions on File Prior to Encounter:  albuterol (PROVENTIL HFA,VENTOLIN HFA, PROAIR HFA) 108 (90 BASE) MCG/ACT inhaler Inhale 2 puffs into the lungs 2 (two) times daily. Disp:  Rfl:    albuterol (PROVENTIL HFA,VENTOLIN HFA, PROAIR HFA) 108 (90 BASE) MCG/ACT inhaler Inhale 2 puffs into the lungs as needed for Wheezing. Disp:  Rfl:    omeprazole (PRILOSEC) 40 MG capsule Take 40 mg by mouth daily. Disp:  Rfl:    Cetirizine HCl (ZYRTEC PO)  Disp:  Rfl:    cetirizine (ZYRTEC) 10 MG tablet Take 10 mg by mouth daily. Disp:  Rfl:      No current facility-administered medications on file prior to encounter.    Review of Systems:  Cardiovascular:  No racing or uneven heartbeat, chest pain or pressure, trouble breathing on lying down, leg cramps on walking, or ankle swelling  Pulmonary:  No prolonged cough, wheezing, difficulty breathing, coughing up phlegm or blood  GI - negative, hematemesis, melena and hematochezia  Neuro:  No stroke,  seizure, migraines or loss of consciousness.      Physical Exam:  Vital Signs: BP 122/75 mmHg   Pulse 97   Temp(Src) 98.1 F (36.7 C)   Resp 13   Ht 5\' 4"  (1.626 m)   Wt 127.007 kg (280 lb)   BMI 48.04 kg/m2   SpO2 96%   LMP 03/19/2014  ENT: No scleral icterus or adenopathy  Airway Evaluation:  Gag reflex intact: Yes  Ability to open mouth wide:   Full  Dentures:  No  Loose teeth:  No  Neck range of motion  Full  Mallampati Airway Classification: Class II   The same as Class I except the tonsilar pillars are hidden by the tounge.  CHEST: Clear to auscultation and percussion.   CARDIOVASC: Normal rate and rhythym, no murmurs, rubs or gallops  ABDOMEN: Normal bowel sounds. No tenderness, mass or enlargement of spleen or liver. No surgical scars.  EXTREMITIES : Without clubbing, cyanosis or edema.   NEUROLOGIC: Alert and oriented x3, no focal findings.  DERM: No jaundice, hives or rashes.    ASA Classification: ASA Class II (a patient with mild systemic disease)    Sedation: MAC    Impression:  Tubular adenoma  (primary encounter diagnosis)  Gastroesophageal reflux disease, esophagitis presence not specified  Mass of ampulla of Vater    No contraindication to proceeding with the indicated Both upper GI endoscopy and colonoscopy    Medical Decision Making/Plan:  The patient is doing well. She is anxious about her ampulla and her history of colon polyps. We will check a colonoscopy and EGD. The patient was informed of the risk of bleeding, discomfort, perforation, a need for surgery, infection, drug reaction, cardiovascular and cerebrovascular compromise and the possibility of missing a lesion or problem.  She understood these risks and consented to the exam.  She does not have sleep apnea but given her weight we will have anesthesia support.    The patient sees a primary care provider outside of [place]. She was asked to provide copies of her record for  any major health events.    Proceed with Both upper GI endoscopy and  colonoscopy

## 2014-06-07 NOTE — Discharge Instructions (Signed)
GI CENTER DISCHARGE INSTRUCTIONS     When you return home, you may feel sleepy. Get plenty of rest for the remainder of the day.     If you received sedation for your procedure DO NOT DRIVE, OPERATE MACHINERY OR MAKE IMPORTANT DECISIONS for the reminder of the day.no alcohol today     It is normal after having a COLONOSCOPY to feel a little gassy and bloated, but if you develop SEVERE ABDOMINAL PAIN call your doctor immediately.     It is normal after having a COLONOSCOPY to see a small amount of blood after your first few bowel movements, but if you see a LARGE AMOUNT OF BRIGHT RED BLOOD, call your doctor immediately.     It is normal after having an UPPER ENDOSCOPY to develop a mild sore throat that will last a few days.     If you had a biopsy or Polypectomy you will need o hold aspirin or medications containing aspirin for  3 day/s.          If you had a biopsy or Polypectomy you will need to hold any medication such as Advil, Motrin, Naproxin, Ibuprofen etc. for 3 days.      Call your doctor immediately if you develop:   -  Chest Pain   -  Shortness of breath   -  Difficulty swallowing   -  Vomit bright red blood   -  Notice your bowel movements are black or maroon colored   -  You feel weak and tired     Call your physician for any unusual symptoms.     If for any reason you are unable to reach your doctor go to the nearest         [place].     SPECIFIC INSTRUCTIONS:  ***  Post Procedure Diagnosis:  - Normal oropharynx.  - No gross lesions in esophagus. Biopsied.  - LA Grade A reflux esophagitis. Rule out Barrett's esophagus. Biopsied.  - No gross lesions in the stomach. Biopsied.  - Mucosal abnormality in the duodenum. Biopsied.  - The examination was otherwise normal.  Complications: No immediate complications. Estimated blood loss: Minimal.  Recommendation:  - Perform a colonoscopy today.  - Await pathology results.  - Continue present medications.  MICHAEL Carleene Overlie, MD   Post Procedure  Diagnosis:  - Diverticulosis in the sigmoid colon and in the descending colon.  - The examined portion of the ileum was normal.  - One 3 mm polyp in the transverse colon. Resected and retrieved.  - The distal rectum and anal verge are normal on retroflexion view.  - The examination was otherwise normal.  Complications: No immediate complications. Estimated blood loss: Minimal.  Recommendation:  - Patient has a contact number available for emergencies. The signs and   symptoms of potential delayed complications were discussed with the patient.   Return to normal activities tomorrow. Written discharge instructions were   provided to the patient.  - Reflux diet and Weight loss diet indefinitely.  - Continue present medications.  - Await pathology results.  - Return to my office 8 to 12 weeks.  Lucio Edward, MD  Date MD Name: Telephone:   06/07/2014 Lucio Edward, MD ***

## 2014-06-07 NOTE — PROVATION-GI (Signed)
[facility]  Patient Name: Alejandra Martin  MRN: [id number]  Account Number: [id number]  Gender: Female  Age: 44  Date of Birth: 02/03/1971  Admit Type: Outpatient  Patient Location: [facility]  Note Status: Finalized  Multiple Proc?: Has a colonoscopy today  Referring MD:         Lucio Edward, MD  Procedure Date:       06/07/2014 9:47:09 AM  Procedure:            Upper GI endoscopy  Endoscopist:          Lucio Edward, MD  Indications for Procedure:       Follow-up of gastro-esophageal reflux disease, Follow-up of polyps in the        duodenum  Medications:          Monitored Anesthesia Care  Procedure:       Pre-Anesthesia Assessment:       - Prior to the procedure, a History and Physical was performed, and patient        medications and allergies were reviewed. The patient is competent. The risks        and benefits of the procedure and the sedation options and risks were        discussed with the patient. All questions were answered and informed consent        was obtained. Patient identification and proposed procedure were verified by        the physician, the nurse and the anesthesiologist in the procedure room.        Mental Status Examination: alert and oriented. Airway Examination: normal        oropharyngeal airway and neck mobility and Mallampati Class II (the uvula but        not tonsillar pillars visualized). Respiratory Examination: clear to        auscultation. CV Examination: normal. Prophylactic Antibiotics: The patient        does not require prophylactic antibiotics. Prior Anticoagulants: The patient        has taken no previous anticoagulant or antiplatelet agents. ASA Grade        Assessment: II - A patient with mild systemic disease. After reviewing the        risks and benefits, the patient was deemed in satisfactory condition to        undergo the procedure. The anesthesia plan was to use moderate sedation /        analgesia (conscious sedation). Immediately prior to administration of         medications, the patient was re-assessed for adequacy to receive sedatives.        The heart rate, respiratory rate, oxygen saturations, blood pressure,        adequacy of pulmonary ventilation, and response to care were monitored        throughout the procedure. The physical status of the patient was re-assessed        after the procedure.       Just prior to the procedure, an updated history and physical was done. I        obtained an informed consent from the patient reviewing the risk of the        procedure including (but not limited to) respiratory depression, perforation,        bleeding, discomfort, a possible need for surgery and unexpected reactions to        medications. The patient is aware that test has limitations and  may not        detect significant lesions such as cancer or other potential diseases. The        patient was also informed that they might need a repeat upper endoscopy        earlier than standard guidelines if there are changes in their symptoms or        concerning findings noted. A time out was performed with the entire procedure        staff present. The scope was passed under direct vision. Throughout the        procedure, the patient's blood pressure, pulse, and oxygen saturations were        monitored continuously. The [id number] was introduced through the        mouth, and advanced to the third part of duodenum. The upper GI endoscopy was        accomplished without difficulty. The patient tolerated the procedure well.        The total duration of the procedure was 12 minutes.  Findings:       The oropharynx was normal.       No gross lesions were noted in the middle third of the esophagus. This was        biopsied with a cold forceps for histology and evaluation of eosinophilic        esophagitis. Estimated blood loss was minimal.       LA Grade A (one or more mucosal breaks less than 5 mm, not extending between        tops of 2 mucosal folds) esophagitis with no  bleeding was found 35 to 36 cm        from the incisors. Biopsies were taken with a cold forceps for histology.        Estimated blood loss was minimal.       No gross lesions were noted in the entire examined stomach. This was biopsied        with a cold forceps for histology and Helicobacter pylori testing. Estimated        blood loss was minimal.       Localized mild mucosal abnormality characterized by congestion and texture        change was found in the area of the papilla. Biopsies were taken with a cold        forceps for histology. Estimated blood loss was minimal. Great care was taken        to avoid the ampullary opening.       The cardia and gastric fundus were normal on retroflexion.       The exam was otherwise without abnormality.  Post Procedure Diagnosis:       - Normal oropharynx.       - No gross lesions in esophagus. Biopsied.       - LA Grade A reflux esophagitis. Rule out Barrett's esophagus. Biopsied.       - No gross lesions in the stomach. Biopsied.       - Mucosal abnormality in the duodenum. Biopsied.       - The examination was otherwise normal.  Complications:        No immediate complications. Estimated blood loss: Minimal.  Recommendation:       - Perform a colonoscopy today.       - Await pathology results.       - Continue present medications.  Lucio Edward, MD  06/07/2014 10:39  AM  This report has been signed electronically.  Number of Addenda: 0  Note Initiated On: 06/07/2014 9:47 AM

## 2014-06-07 NOTE — H&P (Signed)
Pre-Anesthetic Note    Pre op Diagnosis:  Tubular adenoma, dysphagia    Planned Procedure: EGD [id number]    Patient ID:  Alejandra Martin isa 44 year old female          Previous Anesthetic History:       Past Surgical History    NO SIGNIFICANT SURGICAL HISTORY      OB ANTEPARTUM CARE CESAREAN DLVR & POSTPARTUM      CHOLECYSTECTOMY       Patient denies complicationsof anesthesia.  Patient denies family complications of anesthesia.    Current Medications:      Current Outpatient Prescriptions:  albuterol (PROVENTIL HFA,VENTOLIN HFA, PROAIR HFA) 108 (90 BASE) MCG/ACT inhaler Inhale 2 puffs into the lungs 2 (two) times daily. Disp:  Rfl:    albuterol (PROVENTIL HFA,VENTOLIN HFA, PROAIR HFA) 108 (90 BASE) MCG/ACT inhaler Inhale 2 puffs into the lungs as needed for Wheezing. Disp:  Rfl:    omeprazole (PRILOSEC) 40 MG capsule Take 40 mg by mouth daily. Disp:  Rfl:    Cetirizine HCl (ZYRTEC PO)  Disp:  Rfl:    cetirizine (ZYRTEC) 10 MG tablet Take 10 mg by mouth daily. Disp:  Rfl:      No current facility-administered medications for this encounter.    Hospital Meds    PRN Meds          Allergies:   Review of Patient's Allergies indicates:   Erythromycin            Itching    Smoking, Alcohol, Drugs:     Smoking status: Never Smoker     Smokeless tobacco: Never Used    Alcohol Use: No       Drug Use: No       PMHx:    Past Medical History    ACUTE GASTRITIS W/O HEMORRHAGE 06/12/2004    Comment: H. Pylori positive (done at [facility]); GI consult Dr. Phillis Knack, TOC 10/01/04    CLOSTRIDIUM DIFFICILE 07/26/2004    Comment: GI cosult: Dr. Rollene Rotunda, TOC done on 10/01/04    ALLERGY, UNSPECIFIED 03/26/2004    Comment: Seen by ENT, deviated septum, hypertrophy of inferior nasal turbinates    Esophageal reflux     Unspecified asthma(493.90)     Anxiety     Wears eyeglasses     Comment: night driving    Snores     Elevated cholesterol     Comment: resolved without meds    Stomach disease        PHYSICAL EXAMINATION:  There were no vitals  taken for this visit.    General:      Airway Classification:  Mallampati: Class II   The same as Class I except the tonsilar pillars are hidden by the tounge.   Mallampati Airway Classification:   Dental Exam:  wnl   Oral Opening:  3cm  Full range of neck Motion:  Yes     Lungs: clear to auscultation bilaterally    Heart: normal    EKG/Echo:      Chest X-Ray:      Pertinent Labs:     Lab Results  Component Value Date   NA 138 07/21/2012   K 3.8 07/21/2012   CREAT 0.7 07/21/2012   GLUCOSER 86 07/21/2012   WBC 12.6* 07/21/2012   HCT 37.7 07/21/2012   PLTA 266 07/21/2012   PT 10.0 04/28/2008   APTT 27.5 04/28/2008   INR 1.0* 04/28/2008        Other Findings:  None    ASA Assessment: II   A Patient With Mild Systemic Disease  Emergency:  No     Plan:  MAC    Ulice Brilliant, MD   Pager:  [phone]    1 [place] = Mandatory Field.  For non-mandatory fields, the provider will enter relevant positive findings.      Review of Systems   Constitutional: Negative.    HENT: Negative.    Eyes: Negative.    Respiratory: Negative.    Cardiovascular: Negative.    Gastrointestinal: Positive for abdominal pain.   Musculoskeletal: Negative.    Skin: Negative.    All other systems reviewed and are negative.

## 2014-06-12 LAB — SURGICAL PATH SPECIMEN

## 2014-06-17 ENCOUNTER — Encounter (HOSPITAL_BASED_OUTPATIENT_CLINIC_OR_DEPARTMENT_OTHER): Payer: Self-pay | Admitting: Gastroenterology

## 2014-06-27 ENCOUNTER — Ambulatory Visit (HOSPITAL_BASED_OUTPATIENT_CLINIC_OR_DEPARTMENT_OTHER): Payer: No Typology Code available for payment source | Admitting: Gastroenterology

## 2014-06-27 VITALS — BP 149/93 | HR 112 | Temp 97.6°F | Resp 20 | Ht 64.0 in | Wt 287.0 lb

## 2014-06-27 DIAGNOSIS — D369 Benign neoplasm, unspecified site: Secondary | ICD-10-CM

## 2014-06-27 DIAGNOSIS — K219 Gastro-esophageal reflux disease without esophagitis: Secondary | ICD-10-CM

## 2014-06-27 NOTE — Progress Notes (Signed)
This 44 year old English speaking patient presents for follow up of her Both upper GI endoscopy and colonoscopy.    The patient was found to have...  On EGD...  Findings:       The oropharynx was normal.       No gross lesions were noted in the middle third of the esophagus. This was         biopsied with a cold forceps for histology and evaluation of eosinophilic         esophagitis. Estimated blood loss was minimal.       LA Grade A (one or more mucosal breaks less than 5 mm, not extending between         tops of 2 mucosal folds) esophagitis with no bleeding was found 35 to 36 cm         from the incisors. Biopsies were taken with a cold forceps for histology.         Estimated blood loss was minimal.       No gross lesions were noted in the entire examined stomach. This was biopsied         with a cold forceps for histology and Helicobacter pylori testing. Estimated         blood loss was minimal.       Localized mild mucosal abnormality characterized by congestion and texture         change was found in the area of the papilla. Biopsies were taken with a cold         forceps for histology. Estimated blood loss was minimal. Great care was taken         to avoid the ampullary opening.       The cardia and gastric fundus were normal on retroflexion.       The exam was otherwise without abnormality.  Post Procedure Diagnosis:       - Normal oropharynx.       - No gross lesions in esophagus. Biopsied.       - LA Grade A reflux esophagitis. Rule out Barrett's esophagus. Biopsied.       - No gross lesions in the stomach. Biopsied.       - Mucosal abnormality in the duodenum. Biopsied.       - The examination was otherwise normal.      On Colonoscopy ...  Findings:       The perianal and digital rectal examinations were normal. Pertinent negatives         include normal sphincter tone.       Multiple small and large-mouthed diverticula were found in the sigmoid colon         and in the descending colon.        The terminal ileum appeared normal.       A 3 mm polyp was found in the transverse colon. The polyp was sessile. The         polyp was removed with a cold biopsy forceps. Resection and retrieval were         complete. Estimated blood loss was minimal.       The retroflexed view of the distal rectum and anal verge was normal and         showed no anal or rectal abnormalities.       The exam was otherwise without abnormality.  Post Procedure Diagnosis:       - Diverticulosis in the sigmoid colon and  in the descending colon.       - The examined portion of the ileum was normal.       - One 3 mm polyp in the transverse colon. Resected and retrieved.       - The distal rectum and anal verge are normal on retroflexion view.       - The examination was otherwise normal.      We reviewed its significance.    Past Medical History  Patient Active Problem List:     Allergy, unspecified not elsewhere classified     Acute gastritis without mention of hemorrhage     CLOSTRIDIUM DIFFICILE     PURE HYPERCHOLESTEROLEM     GENERALIZED ANXIETY DIS     Benign Positional Vertigo     Contraceptive Management     Tubular Adenoma     GERD (gastroesophageal reflux disease)     Diarrhea     Cholecystitis     Epigastric abdominal pain     Mass of ampulla of Vater     Gastroesophageal reflux disease with esophagitis     Morbid obesity     Colon polyp     Mucosal abnormality of duodenum      Past Surgical History      Past Surgical History    NO SIGNIFICANT SURGICAL HISTORY      OB ANTEPARTUM CARE CESAREAN DLVR & POSTPARTUM      CHOLECYSTECTOMY         Review of Patient's Allergies indicates:   Erythromycin            Itching    Social Hx    Smoking Status: Never Smoker                      Smokeless Status: Never Used                        Alcohol Use: No                  Current Outpatient Prescriptions:  albuterol (PROVENTIL HFA,VENTOLIN HFA, PROAIR HFA) 108 (90 BASE) MCG/ACT inhaler Inhale 2 puffs into the lungs 2 (two) times daily.  Disp:  Rfl:    omeprazole (PRILOSEC) 40 MG capsule Take 40 mg by mouth daily. Disp:  Rfl:    cetirizine (ZYRTEC) 10 MG tablet Take 10 mg by mouth daily. Disp:  Rfl:    albuterol (PROVENTIL HFA,VENTOLIN HFA, PROAIR HFA) 108 (90 BASE) MCG/ACT inhaler Inhale 2 puffs into the lungs as needed for Wheezing. Disp:  Rfl:    Cetirizine HCl (ZYRTEC PO)  Disp:  Rfl:      No current facility-administered medications for this visit.    Family Hx    Family History    Diabetes Mother     Hypertension Mother     Lipids Mother     Hypertension Father      No family history of colon cancer, IBD.    REVIEW OF SYSTEMS:    Cardiovascular:  No chest pain, palpitations, MI, heart failure,  Pulmonary:  No TB or asthma.  She had pneumonia.    Neuro:  No stroke, seizure or loss of consciousness.     Endocrine:  No diabetes or thyroid disease     Physical Exam:  Vital Signs: BP 149/93 mmHg   Pulse 112   Temp(Src) 97.6 F (36.4 C) (Temporal)   Resp 20   Ht 5\' 4"  (1.626  m)   Wt 130.182 kg (287 lb)   BMI 49.24 kg/m2   SpO2 98%   LMP 03/19/2014 Body mass index is 49.24 kg/(m^2).  General: Obese body habitus.   Pulmonary: Clear to auscultation and percussion. No rales, wheezes or rhonchi.  Cardiovascular: S1, S2, no S3, S4, clicks, rubs or murmurs.  JVD not elevated with patient sitting.  Abdominal: Normal bowel sounds. No tenderness on light or deep palpation. No hepatomegaly or splenomegaly.    Labs  Path:  >>FINAL DIAGNOSIS<<      SMALL BOWEL (AMPULLA OF VATER), BIOPSY:   - SMALL FOCUS OF GASTRIC TYPE GLANDS IN THE MUCOSA, MOST   CONSISTENT WITH GASTRIC METAPLASIA/HETEROTOPIA.   - NO DYSPLASIA OR MALIGNANCY IS IDENTIFIED.   - FRAGMENTS OF SMALL BOWEL MUCOSA WITH CONGESTION AND   CHRONIC INFLAMMATION AND SOME ARCHITECTURAL DISTORTION. SEE   NOTE.      Note: The differential diagnosis includes gastric metaplasia   and gastric heterotopia but the former is favored as no   definitive parietal cells or chief cells are identified.   Multiple  levels are examined. If the clinically identified   lesion is submucosal, the findingof the gastric metaplasia   may or may not be responsible for the clinically identified   mass. Clinical correlation is required as the biopsies are   superficial.      STOMACH, BIOPSY:   - FRAGMENT OF CONGESTED ANTRAL AND BODY TYPE GASTRIC MUCOSA.   - NO H. PYLORI-LIKE ORGANISMS ARE IDENTIFIED ON HEMATOXYLIN   AND EOSIN STAINED SLIDE.      ESOPHAGUS (GE JUNCTION), BIOPSY:   - FRAGMENTS OF GLANDULAR MUCOSA WITH INTESTINAL METAPLASIA   CONGESTION AND CHRONIC INFLAMMATORY INFILTRATE INCLUDING   LYMPHOCYTES, PLASMA CELLS, AND A FEW EOSINOPHILS. (SEE   NOTE).   - NO DYSPLASIA IS IDENTIFIED.      Note: The features are consistent with Barrett's esophagus,   if supported by endoscopic findings.      ESOPHAGUS, BIOPSY:   - FRAGMENTS OF BENIGN SQUAMOUS EPITHELIUM, NO DIAGNOSTIC   ABNORMALITY RECOGNIZED.      COLON (TRANSVERSE, 3 MM POLYP), POLYPECTOMY:   - TUBULAR ADENOMA.     Impression:  Gastroesophageal reflux disease, esophagitis presence not specified  (primary encounter diagnosis)  Tubular adenoma    Medical Decision Making:  The patient should have a follow up Upper GI endoscopy in 2 yrs. We also discussed the fact that she should come in sooner should she have any problems with her bowels. She knows that the Upper GI endoscopy done was not perfect and that we could have missed a lesion. She also knows that it is her responsibility to set up the follow up appointment.    The patient will also contact her blood relatives who are 56 and older. She will make sure that they have their colon screening exams done.    The patient will be referred for genetic testing. Her father died for esophageal cancer (stomach gastric cardia?). Her paternal aunts and uncles had colon cancer, ovarian, stomach cancer and lung cancer. There is a possibility that she might have HNPCC. Will refer to genetics.     Over 25 minutes spent with the  patient, more than half of which were in counseling.       Over 15 minutes spent with the patient, more than half of which were spent in counseling.

## 2014-07-08 ENCOUNTER — Encounter (HOSPITAL_BASED_OUTPATIENT_CLINIC_OR_DEPARTMENT_OTHER): Payer: Self-pay

## 2014-07-10 DIAGNOSIS — K2270 Barrett's esophagus without dysplasia: Secondary | ICD-10-CM | POA: Insufficient documentation

## 2014-07-10 DIAGNOSIS — R7989 Other specified abnormal findings of blood chemistry: Secondary | ICD-10-CM | POA: Insufficient documentation

## 2014-07-10 DIAGNOSIS — R945 Abnormal results of liver function studies: Secondary | ICD-10-CM

## 2014-10-03 ENCOUNTER — Ambulatory Visit (HOSPITAL_BASED_OUTPATIENT_CLINIC_OR_DEPARTMENT_OTHER): Payer: No Typology Code available for payment source | Admitting: Gastroenterology

## 2014-10-03 ENCOUNTER — Other Ambulatory Visit (HOSPITAL_BASED_OUTPATIENT_CLINIC_OR_DEPARTMENT_OTHER): Payer: Self-pay | Admitting: Gastroenterology

## 2014-10-03 VITALS — BP 146/86 | HR 90 | Temp 97.7°F | Resp 14 | Ht 63.0 in | Wt 274.0 lb

## 2014-10-03 DIAGNOSIS — K209 Esophagitis, unspecified without bleeding: Secondary | ICD-10-CM

## 2014-10-03 DIAGNOSIS — D369 Benign neoplasm, unspecified site: Secondary | ICD-10-CM

## 2014-10-03 DIAGNOSIS — K219 Gastro-esophageal reflux disease without esophagitis: Secondary | ICD-10-CM

## 2014-10-03 DIAGNOSIS — K648 Other hemorrhoids: Secondary | ICD-10-CM

## 2014-10-03 DIAGNOSIS — K2270 Barrett's esophagus without dysplasia: Secondary | ICD-10-CM

## 2014-10-03 MED ORDER — PHENYLEPHRINE-MINERAL OIL-PET 0.25-14-74.9 % PR OINT
TOPICAL_OINTMENT | Freq: Two times a day (BID) | RECTAL | 4 refills | Status: AC | PRN
Start: 2014-10-03 — End: 2015-04-01

## 2014-10-03 NOTE — Progress Notes (Signed)
This 44 year old English speaking patient presents for follow up of her GERD.    The patient tried taking 40 mg omeprazole a day and this made her fell a bit ill. She went back to 20 mg and this is better tolerated.    The patient denied abdominal pain, heartburn, regurgitation, dysphagia, odynophagia, early satiety, weight loss, nausea, vomiting, hematemesis, melena, diarrhea, bowel urgency, constipation, fever, anemia, or jaundice.    The patient has hemorrhoids. She noted that these bleed a few weeks ago for 3 to 4 days.    She has lost some weight.     Past Medical History  Patient Active Problem List:     Allergy, unspecified not elsewhere classified     Acute gastritis without mention of hemorrhage     Intestinal infection due to Clostridium difficile     Pure hypercholesterolemia     Generalized anxiety disorder     Benign positional vertigo     Contraceptive management     Tubular adenoma     GERD (gastroesophageal reflux disease)     Diarrhea     Cholecystitis     Epigastric abdominal pain     Mass of ampulla of Vater     Gastroesophageal reflux disease with esophagitis     Morbid obesity     Colon polyp     Mucosal abnormality of duodenum        Past Surgical History    NO SIGNIFICANT SURGICAL HISTORY      OB ANTEPARTUM CARE CESAREAN DLVR & POSTPARTUM      CHOLECYSTECTOMY         Review of Patient's Allergies indicates:   Erythromycin            Itching    Social Hx  Smoking status: Never Smoker                                                              Smokeless status: Never Used                      Alcohol use: No                  Current Outpatient Prescriptions:  albuterol (PROVENTIL HFA,VENTOLIN HFA, PROAIR HFA) 108 (90 BASE) MCG/ACT inhaler Inhale 2 puffs into the lungs 2 (two) times daily. Disp:  Rfl:    omeprazole (PRILOSEC) 40 MG capsule Take 40 mg by mouth daily. Disp:  Rfl:    cetirizine (ZYRTEC) 10 MG tablet Take 10 mg by mouth daily. Disp:  Rfl:    albuterol (PROVENTIL HFA,VENTOLIN HFA,  PROAIR HFA) 108 (90 BASE) MCG/ACT inhaler Inhale 2 puffs into the lungs as needed for Wheezing. Disp:  Rfl:    Cetirizine HCl (ZYRTEC PO)  Disp:  Rfl:      No current facility-administered medications for this visit.     Family Hx    Family History    Diabetes Mother     Hypertension Mother     Lipids Mother     Hypertension Father          REVIEW OF SYSTEMS:    Cardiovascular:  No chest pain, palpitations, MI, heart failure,  Pulmonary:  No TB or asthma.  She had pneumonia.  Neuro:  No stroke, seizure or loss of consciousness.     Endocrine:  No diabetes or thyroid disease      Physical Exam:  Vital Signs: BP 146/86  Pulse 90  Temp 97.7 F (36.5 C) (Temporal)  Resp 14  Ht 5\' 3"  (1.6 m)  Wt 124.3 kg (274 lb)  SpO2 98%  BMI 48.54 kg/m2 Body mass index is 48.54 kg/(m^2).  General: Obese body habitus.   Pulmonary: Clear to auscultation and percussion. No rales, wheezes or rhonchi.  Cardiovascular: S1, S2, no S3 or murmurs.    Abdominal: Normal bowel sounds. No tenderness on light or deep palpation. No hepatomegaly or splenomegaly.  Neurological: Sensation intact. Normal motor function. Normal gait. Alert and oriented x 3      Impression:  Barrett's esophagus with esophagitis  (primary encounter diagnosis)  Gastroesophageal reflux disease, esophagitis presence not specified  Tubular adenoma    Medical Decision Making:  The patient is doing well. She was advised to continue to loose weight. She will stay on omeprazole. The patient will stay on the Omeprazole. We discussed her diet and avoiding mints, fatty foods, coffee, caffeine, chocolate and alcohol. She will use an OTC one a day multivitamin since he will be on omeprazole for a long time. Discussed risks of osteopetrosis, CHF and demetia.    Reviewed with patient who agrees with our plan. Follow up with me in 5 months.

## 2015-01-03 LAB — SURGICAL PATH SPECIMEN

## 2015-03-01 ENCOUNTER — Ambulatory Visit (HOSPITAL_BASED_OUTPATIENT_CLINIC_OR_DEPARTMENT_OTHER): Payer: No Typology Code available for payment source | Admitting: Gastroenterology

## 2015-03-01 VITALS — BP 137/86 | HR 85 | Temp 97.8°F | Resp 14 | Ht 63.0 in | Wt 272.0 lb

## 2015-03-01 DIAGNOSIS — K22719 Barrett's esophagus with dysplasia, unspecified: Secondary | ICD-10-CM

## 2015-03-01 DIAGNOSIS — K219 Gastro-esophageal reflux disease without esophagitis: Secondary | ICD-10-CM

## 2015-03-01 NOTE — Progress Notes (Signed)
This 45 year old English speaking patient presents for follow up of her GERD and Barrett's esophagitis.    The patient denied heartburn, regurgitation, dysphagia, odynophagia, early satiety, weight loss, nausea, vomiting, hematemesis, hematochezia, melena, constipation, fever, anemia or jaundice.    The patient has had slow gradual weight loss. She was encouraged to continue doing this.    Past Medical History  Patient Active Problem List:     Allergy, unspecified not elsewhere classified     Acute gastritis without mention of hemorrhage     Intestinal infection due to Clostridium difficile     Pure hypercholesterolemia     Generalized anxiety disorder     Benign positional vertigo     Contraceptive management     Tubular adenoma     GERD (gastroesophageal reflux disease)     Diarrhea     Cholecystitis     Epigastric abdominal pain     Mass of ampulla of Vater     Gastroesophageal reflux disease with esophagitis     Morbid obesity ([place])     Colon polyp     Mucosal abnormality of duodenum        Past Surgical History    NO SIGNIFICANT SURGICAL HISTORY      OB ANTEPARTUM CARE CESAREAN DLVR & POSTPARTUM      CHOLECYSTECTOMY         Review of Patient's Allergies indicates:   Erythromycin            Itching    Social Hx  Smoking status: Never Smoker                                                              Smokeless status: Never Used                      Alcohol use: No                  Current Outpatient Prescriptions:  vitamin B-12 (CYANOCOBALAMIN) 500 MCG tablet Take 500 mcg by mouth daily Disp:  Rfl:    albuterol (PROVENTIL HFA,VENTOLIN HFA, PROAIR HFA) 108 (90 BASE) MCG/ACT inhaler Inhale 2 puffs into the lungs 2 (two) times daily. Disp:  Rfl:    omeprazole (PRILOSEC) 40 MG capsule Take 40 mg by mouth daily. Disp:  Rfl:    cetirizine (ZYRTEC) 10 MG tablet Take 10 mg by mouth daily. Disp:  Rfl:    phenylephrine-shark liver oil-mineral oil-petrolatum (PREPARATION H) 0.25-14-74.9 % rectal ointment Place rectally 2  (two) times daily as needed for Hemorrhoids Use for 5 days in a row. No more than once a month. Disp: 30 g Rfl: 4   albuterol (PROVENTIL HFA,VENTOLIN HFA, PROAIR HFA) 108 (90 BASE) MCG/ACT inhaler Inhale 2 puffs into the lungs as needed for Wheezing. Disp:  Rfl:    Cetirizine HCl (ZYRTEC PO)  Disp:  Rfl:      No current facility-administered medications for this visit.     Family Hx    Family History    Diabetes Mother     Hypertension Mother     Lipids Mother     Hypertension Father        REVIEW OF SYSTEMS:    Cardiovascular:  No chest pain, palpitations, MI, heart  failure,  Pulmonary:  No TB or asthma.  She had pneumonia.    Neuro:  No stroke, seizure or loss of consciousness.     Endocrine:  No diabetes or thyroid disease    Physical Exam:  Vital Signs: BP 137/86  Pulse 85  Temp 97.8 F (36.6 C) (Temporal)  Resp 14  Ht 5\' 3"  (1.6 m)  Wt 123.4 kg (272 lb)  SpO2 97%  BMI 48.18 kg/m2 Body mass index is 48.18 kg/(m^2).  General: Obese body habitus.   Pulmonary: Clear to auscultation and percussion. No rales, wheezes or rhonchi.  Cardiovascular: S1, S2, no S3 or murmurs.    Abdominal: Normal bowel sounds. No tenderness on light or deep palpation. No hepatomegaly or splenomegaly.  Neurological: Sensation intact. Normal motor function. Normal gait. Alert and oriented x 3    Impression:  Barrett's esophagus with dysplasia  (primary encounter diagnosis)  Gastroesophageal reflux disease, esophagitis presence not specified    Medical Decision Making:  The patient is doing well. She brought in a genetic screen done at [facility] which was negative for her and over 32 tested cancer related genes.    We discussed that she has Barrett's esophagitis and will need a repeat EGD in 2 yrs (05/2016). She will stay on the Omeprazole.     Reviewed with patient who agrees with our plan. Follow up with me in 6 months.

## 2015-06-04 DIAGNOSIS — H53021 Refractive amblyopia, right eye: Secondary | ICD-10-CM | POA: Insufficient documentation

## 2015-09-06 ENCOUNTER — Ambulatory Visit (HOSPITAL_BASED_OUTPATIENT_CLINIC_OR_DEPARTMENT_OTHER): Payer: No Typology Code available for payment source | Admitting: Gastroenterology

## 2015-10-29 DIAGNOSIS — B353 Tinea pedis: Secondary | ICD-10-CM | POA: Insufficient documentation

## 2016-03-28 ENCOUNTER — Telehealth (HOSPITAL_BASED_OUTPATIENT_CLINIC_OR_DEPARTMENT_OTHER): Payer: Self-pay | Admitting: Gastroenterology

## 2016-03-28 NOTE — Progress Notes (Signed)
Alejandra Martin  from [facility] called the [facility] to complete a benefit analysis for the tdap Vaccine.   The vaccine is covered under the patient's [name] medical medical coverage.  Please choose [phone] (Private)  If you do not have the vaccine in stock, please contact the [facility] [phone]) to have the vaccine delivered to your clinic

## 2016-04-04 ENCOUNTER — Ambulatory Visit (HOSPITAL_BASED_OUTPATIENT_CLINIC_OR_DEPARTMENT_OTHER): Payer: PRIVATE HEALTH INSURANCE | Admitting: Internal Medicine

## 2016-05-13 ENCOUNTER — Encounter (HOSPITAL_BASED_OUTPATIENT_CLINIC_OR_DEPARTMENT_OTHER): Payer: Self-pay | Admitting: Internal Medicine

## 2016-05-13 ENCOUNTER — Ambulatory Visit (HOSPITAL_BASED_OUTPATIENT_CLINIC_OR_DEPARTMENT_OTHER): Payer: PRIVATE HEALTH INSURANCE | Admitting: Internal Medicine

## 2016-05-13 VITALS — BP 100/86 | HR 88 | Temp 97.3°F | Ht 63.0 in | Wt 273.4 lb

## 2016-05-13 DIAGNOSIS — N912 Amenorrhea, unspecified: Secondary | ICD-10-CM

## 2016-05-13 DIAGNOSIS — Z13220 Encounter for screening for lipoid disorders: Secondary | ICD-10-CM

## 2016-05-13 DIAGNOSIS — K219 Gastro-esophageal reflux disease without esophagitis: Secondary | ICD-10-CM

## 2016-05-13 DIAGNOSIS — Z131 Encounter for screening for diabetes mellitus: Secondary | ICD-10-CM

## 2016-05-13 DIAGNOSIS — D126 Benign neoplasm of colon, unspecified: Secondary | ICD-10-CM

## 2016-05-13 DIAGNOSIS — R102 Pelvic and perineal pain unspecified side: Secondary | ICD-10-CM

## 2016-05-13 DIAGNOSIS — Z7189 Other specified counseling: Secondary | ICD-10-CM

## 2016-05-13 DIAGNOSIS — K2270 Barrett's esophagus without dysplasia: Secondary | ICD-10-CM

## 2016-05-13 DIAGNOSIS — Z1239 Encounter for other screening for malignant neoplasm of breast: Secondary | ICD-10-CM

## 2016-05-13 MED ORDER — OMEPRAZOLE 40 MG PO CPDR
40.0000 mg | DELAYED_RELEASE_CAPSULE | Freq: Every day | ORAL | 1 refills | Status: DC
Start: 2016-05-13 — End: 2016-06-18

## 2016-05-13 MED ORDER — OMEPRAZOLE 40 MG PO CPDR: 40 mg | capsule | Freq: Every day | ORAL | 1 refills | 0 days | Status: DC

## 2016-05-13 NOTE — Progress Notes (Signed)
Alejandra Martin is a 46 year old female presenting with:    Here with several month of cramping and decreased period/ amenorrhea.  Cramping and bloating sensation is with period and denies symptoms now.  period has been less for the past 3-6 months  This month she had one day of period about 2 weeks ago  Not been sexually active since   Denies urinary symptoms of frequency, burning sensation.  Denies fever or chills  No flank or back pain now  Also denies vaginal symptoms of discharge, pain, itching, rash      She had normal PAP/ HPV in 2014:    Accession Number:[id number] Report Status:Updated   Type:Cytology    Cytology Report:[id number]    [facility]    [place], [place] [address] Tel [phone]        GYN Cytology Report                Accession [id number]    Patient Name:[name], [name]    DOB:06/07/1970 (Age: 27)    Sex:F    ION:[id number]    Institution:[facility]    [place]    Date of Collection:12/06/2012    Date of Accession:12/06/2012    Reported:12/21/2012 15:33    Results to:    Alejandra Martin                FINAL DIAGNOSIS        A.CERVICAL, LIQUID BASED SPECIMEN:        SPECIMEN ADEQUACY:    Satisfactory for evaluation; transformation zone absent/insufficient.        INTERPRETATION:    NEGATIVE FOR INTRAEPITHELIAL LESION OR MALIGNANCY.        ADDITIONAL INFORMATION:    A manual review was performed for Quality Control purposes.    This specimen was prescreened using the BD FocalPoint GS Imaging System.               Electronically Signed Out Alejandra Martin, CT(ASCP)        Alejandra Martin, CT(ASCP)        Cervical cytology is a screening test primarily for squamous cancers and     precursors and has associated false-negative and false-positive results.New    technologies such as liquid-based preparations may decrease but will not    eliminate all false-negative results.Regular sampling and follow-up of    unexplained clinical signs and symptoms are recommended to minimize false    negative results.                 PROCEDURES/ADDENDA        HPV Testing (Requested)    Ordered Date: 12/17/2012        HPV Test        Negative for human papillomavirus types 16, 18 and the "Other high risk" probe    set (Includes 31, 33, 35, 39, 45, 51, 52, 56, 58, 59, 66, 68) by Roche cobase    4800 HR-HPV analysis.         Clinical correlation is advised. The accuracy and precision of this test has    been verified in the Cytopathology laboratory of the [facility]. This test has not been cleared or approved by the U.S. Food and Drug    Administration (FDA).               Electronically Signed Out Alejandra Martin, CT(ASCP) on 12/24/2012 09:38        CLINICAL HISTORY  Date of Last Menstrual Period:           She had pelvic US in the past, which was normal     Result Impression   IMPRESSION:    Normal sonographic appearance of the uterus and ovaries.   Result Narrative      TECHNIQUE:  Transabdominal and transvaginal ultrasound imaging of the pelvis was performed.   3D reconstructions were created and reviewed    COMPARISON: None available.    FINDINGS:    KIDNEYS: Unremarkable.    UTERUS:The uterus measures 11.1 x 4.1 x 5.3 cm.The endometrial echocomplex  measures 4 mm.    OVARIES/ADNEXA: The left ovary measures 2.7 x 1.6 x 1.9 cm and the right ovary  measures 2.4 x 1.3 x 2.1 cm. No adnexal masses are seen.    PELVIS:No free fluid.           Also hx of upset - stomach  GERD and   Barrett's esophagus -     She follows  with GI Alejandra Martin for Barrett's esophagus and tubular colon polyp.:  "STOMACH, BIOPSY:   - FRAGMENT OF CONGESTED ANTRAL AND BODY TYPE GASTRIC MUCOSA.   - NO H. PYLORI-LIKE ORGANISMS ARE IDENTIFIED ON HEMATOXYLIN   AND EOSIN STAINED SLIDE.      ESOPHAGUS (GE JUNCTION), BIOPSY:   - FRAGMENTS OF GLANDULAR MUCOSA WITH INTESTINAL METAPLASIA   CONGESTION AND CHRONIC INFLAMMATORY INFILTRATE INCLUDING   LYMPHOCYTES, PLASMA CELLS, AND A FEW EOSINOPHILS. (SEE   NOTE).   - NO DYSPLASIA IS IDENTIFIED.      Note: The features are consistent with Barrett's esophagus,   if supported by endoscopic findings.      ESOPHAGUS, BIOPSY:   - FRAGMENTS OF BENIGN SQUAMOUS EPITHELIUM, NO DIAGNOSTIC   ABNORMALITY RECOGNIZED.      COLON (TRANSVERSE, 3 MM POLYP), POLYPECTOMY:   - TUBULAR ADENOMA.   >>FINAL DIAGNOSIS<<"      Denies significant wt loss:    Most Recent Weight Reading(s)  05/13/16 : 124 kg (273 lb 6.4 oz)  03/01/15 : 123.4 kg (272 lb)  10/03/14 : 124.3 kg (274 lb)  06/27/14 : 130.2 kg (287 lb)  06/07/14 : 127 kg (280 lb)    She missed routine mammo, in fall 2017 due to insurance.  Last normal on 12/05/14:  "IMPRESSION:  There is no specific mammographic evidence of malignancy.    BI-RADS Category 1: Negative"    Additional ROS:  All others reviewed and negative.    Past Medical History:  06/12/2004: ACUTE GASTRITIS W/O HEMORRHAGE      Comment: H. Pylori positive (done at [facility] GI                consult Dr. Phillis Martin, TOC 10/01/04  03/26/2004: ALLERGY, UNSPECIFIED      Comment: Seen by ENT, deviated septum, hypertrophy of                inferior nasal turbinates  No date: Anxiety  07/26/2004: CLOSTRIDIUM DIFFICILE      Comment: GI cosult: Alejandra Martin, TOC done on 10/01/04  No date: Elevated cholesterol      Comment: resolved without meds  No date: Esophageal reflux  No date: Snores  No date: Stomach disease  No date: Unspecified asthma(493.90)  No date: Wears eyeglasses      Comment: night driving  Past Surgical  History:  No date: CHOLECYSTECTOMY  No date: NO SIGNIFICANT SURGICAL HISTORY  No date: OB ANTEPARTUM CARE CESAREAN DLVR &  POSTPARTUM    Family History    Diabetes Mother     Hypertension Mother     Lipids Mother     Hypertension Father        Social History  Social History   Marital status: Married  Spouse name: N/A    Years of education: N/A  Number of children: N/A     Occupational History  None on file     Social History Main Topics   Smoking status: Never Smoker    Smokeless tobacco: Never Used    Alcohol use No    Drug use: No    Sexual activity: Yes    Partners: Male    Comment: men age 89 q 4-6 wks x 3d, small fibroid on u/s, 1st SA age 4, 2 lifetime partners, current x 8 yrs     Other Topics Concern   None on file     Social History Narrative    07/09/2005    [name] family at age 45, husband from [place]    Works [employer]    Pt thrilled with pregnancy-seeking pregnancy x 4 yrs, very anxious since sab 2 yrs ago    Has one child    Not working now    Lives with husband and daughter    Alton Revere, Martin, 05/13/2016, 1:50 PM         Immunization History   Administered Date(s) Administered   . HEP B ADULT 3 DOSE 20 and > 04/05/2004, 05/03/2004, 10/07/2004   . Influenza Virus Tri Presv Free 3/> YRS IM 01/20/2006   . Td 04/03/2004       Current Outpatient Prescriptions:  omeprazole (PRILOSEC) 40 MG capsule Take 1 capsule by mouth daily Disp: 30 capsule Rfl: 1   albuterol (PROVENTIL HFA,VENTOLIN HFA, PROAIR HFA) 108 (90 BASE) MCG/ACT inhaler Inhale 2 puffs into the lungs 2 (two) times daily. Disp:  Rfl:    cetirizine (ZYRTEC) 10 MG tablet Take 10 mg by mouth daily. Disp:  Rfl:      No current facility-administered medications for this visit.     Physical exam:  BP 100/86  Pulse 88  Temp 97.3 F (36.3 C) (Temporal)  Ht 5\' 3"  (1.6 m)  Wt 124 kg (273 lb 6.4 oz)  LMP 04/10/2016  SpO2 98%  BMI 48.43 kg/m2    Constitutional: Not in acute distress.     Cardiovascular: Regular rate, rhythm, normal S1/ S2 heart sounds. No gallop, friction rub, or murmur.  Pulmonary/Chest: Effort normal and breath sounds normal. No respiratory distress. No tenderness. No wheezes, rales.  Abdominal: Soft. Bowel sounds are normal. No distension or mass. There is no rebound or guarding. No tenderness. No cva tenderness   Musculoskeletal: No edema.    Neurological:  Alert and oriented. Grossly intact.   Psychiatric: guarded mood and affect. Thought content appears to be intact.       Assessment and Plan:  (R10.2) Pelvic cramping  (primary encounter diagnosis)  (N91.2) Amenorrhea  Comment: could be premenopausal states, worries about her cramping at this time along with associated bloeating, would be reasonable to obtain US to rule out gynecological etiology. Has not been sexually active, and no evidence for infection etiology clinically today.  Patient was educated/ counseled on warning signs and to seek immediate care in those scenarios  Plan: CBC WITH PLATELET, COMPREHENSIVE METABOLIC         PANEL, THYROID SCREEN TSH  REFLEX FT4        US PELVIC NON-OB W TRANSVAG    (Z12.31) Screening for malignant neoplasm of breast  Plan: [facility] MAMMOGRAPHY SCREENING BILATERAL W CAD        (E66.01) Morbid obesity ([place])  (Z13.1) Screening for diabetes mellitus  (Z13.220) Screening for lipid disorders  Comment:   I would recommend lifestyle changes: including weight loss, regular exercise with walking at least 30 minutes a day for 5 days in a week. Balanced low calorie or energy diet with more vegetables, lean proteins, less sweets, bread, rice, pasta, fried food, sugary foods and drinks. Increase water intake to keep well hydrated. And keeping food and exercise journal.   Plan: LIPID PANEL, HEMOGLOBIN A1C  Plan: HEMOGLOBIN A1C  Plan: LIPID PANEL      (Z71.89) Advance care planning  Plan: HEALTH CARE PROXY          (D12.6) Tubular adenoma of colon  (K21.9) Gastroesophageal reflux disease, esophagitis  presence not specified  (K22.70) Barrett's esophagus without dysplasia  She follows with GI Alejandra Martin for Barrett's esophagus and tubular colon polyp.:  Biopsy in 2016 as follows:  "STOMACH, BIOPSY:   - FRAGMENT OF CONGESTED ANTRAL AND BODY TYPE GASTRIC MUCOSA.   - NO H. PYLORI-LIKE ORGANISMS ARE IDENTIFIED ON HEMATOXYLIN   AND EOSIN STAINED SLIDE.      ESOPHAGUS (GE JUNCTION), BIOPSY:   - FRAGMENTS OF GLANDULAR MUCOSA WITH INTESTINAL METAPLASIA   CONGESTION AND CHRONIC INFLAMMATORY INFILTRATE INCLUDING   LYMPHOCYTES, PLASMA CELLS, AND A FEW EOSINOPHILS. (SEE   NOTE).   - NO DYSPLASIA IS IDENTIFIED.      Note: The features are consistent with Barrett's esophagus,   if supported by endoscopic findings.      ESOPHAGUS, BIOPSY:   - FRAGMENTS OF BENIGN SQUAMOUS EPITHELIUM, NO DIAGNOSTIC   ABNORMALITY RECOGNIZED.      COLON (TRANSVERSE, 3 MM POLYP), POLYPECTOMY:   - TUBULAR ADENOMA.   >>FINAL DIAGNOSIS<<"  Cont PPI  Her next appt is on 05/29/16, she plans to attend      We discussed the patient's current medications. The patient expressed understanding and no barriers to adherence were identified.   1. The patient indicates understanding of these issues and agrees with the plan. Brief care plan is updated and reviewed with the patient.   2. The patient is given an After Visit Summary sheet that lists all medications with directions, allergies, orders placed during this encounter, and follow-up instructions.   3. I reviewed the patient's medical information and medical history.   4. I reconciled the patient's medication list and prepared and supplied needed refills.   5. I have reviewed the past medical, family, and social history sections including the medications and allergies.     Alton Revere, Martin, MPH, [facility]

## 2016-05-14 ENCOUNTER — Ambulatory Visit (HOSPITAL_BASED_OUTPATIENT_CLINIC_OR_DEPARTMENT_OTHER): Payer: PRIVATE HEALTH INSURANCE

## 2016-05-14 DIAGNOSIS — N912 Amenorrhea, unspecified: Secondary | ICD-10-CM

## 2016-05-14 DIAGNOSIS — Z13220 Encounter for screening for lipoid disorders: Secondary | ICD-10-CM

## 2016-05-14 DIAGNOSIS — Z131 Encounter for screening for diabetes mellitus: Secondary | ICD-10-CM

## 2016-05-14 LAB — CBC WITH PLATELET
HEMATOCRIT: 40.2 % (ref 34.1–44.9)
HEMOGLOBIN: 12.7 g/dL (ref 11.2–15.7)
MEAN CORP HGB CONC: 31.6 g/dL (ref 31.0–37.0)
MEAN CORPUSCULAR HGB: 28.3 pg (ref 26.0–34.0)
MEAN CORPUSCULAR VOL: 89.7 fL (ref 80.0–100.0)
MEAN PLATELET VOLUME: 10.3 fL (ref 8.7–12.5)
PLATELET COUNT: 292 10*3/uL (ref 150–400)
RBC DISTRIBUTION WIDTH STD DEV: 46.3 fL (ref 35.1–46.3)
RBC DISTRIBUTION WIDTH: 14.1 % (ref 11.5–14.3)
RED BLOOD CELL COUNT: 4.48 M/uL (ref 3.90–5.20)
WHITE BLOOD CELL COUNT: 7.3 10*3/uL (ref 4.0–11.0)

## 2016-05-14 LAB — COMPREHENSIVE METABOLIC PANEL
ALANINE AMINOTRANSFERASE: 20 U/L (ref 12–45)
ALBUMIN: 3.7 g/dL (ref 3.4–5.0)
ALKALINE PHOSPHATASE: 114 U/L (ref 45–117)
ANION GAP: 10 mmol/L (ref 5–15)
ASPARTATE AMINOTRANSFERASE: 15 U/L (ref 8–34)
BILIRUBIN TOTAL: 0.4 mg/dL (ref 0.2–1.0)
BUN (UREA NITROGEN): 15 mg/dL (ref 7–18)
CALCIUM: 8.9 mg/dL (ref 8.5–10.1)
CARBON DIOXIDE: 27 mmol/L (ref 21–32)
CHLORIDE: 104 mmol/L (ref 98–107)
CREATININE: 0.9 mg/dL (ref 0.4–1.2)
ESTIMATED GLOMERULAR FILT RATE: >60 mL/min (ref >60)
Glucose Random: 91 mg/dL (ref 74–160)
POTASSIUM: 4.2 mmol/L (ref 3.5–5.1)
SODIUM: 141 mmol/L (ref 136–145)
TOTAL PROTEIN: 7.6 g/dL (ref 6.4–8.2)

## 2016-05-14 LAB — LIPID PANEL
Cholesterol: 256 mg/dL (ref 0–239)
HIGH DENSITY LIPOPROTEIN: 42 mg/dL (ref 40–?)
LOW DENSITY LIPOPROTEIN DIRECT: 179 mg/dL (ref 0–189)
TRIGLYCERIDES: 132 mg/dL (ref 0–150)

## 2016-05-14 LAB — THYROID SCREEN TSH REFLEX FT4: THYROID SCREEN TSH REFLEX FT4: 2.51 u[IU]/mL (ref 0.358–3.740)

## 2016-05-14 NOTE — Progress Notes (Signed)
Labs drawn.  1 sst and 2 lav sent to lab per [facility].  Thayer [place], [place], 05/14/2016, 8:38 AM

## 2016-05-15 LAB — HEMOGLOBIN A1C
ESTIMATED AVERAGE GLUCOSE: 111 (ref 74–160)
HEMOGLOBIN A1C: 5.5 % (ref 4.0–5.6)

## 2016-05-15 NOTE — Progress Notes (Signed)
Please send normal result letter in patient's language.   "Dear Alejandra Martin:    It was great seeing you during your recent clinic visit.   Your test result overall was good. Your tests were within normal limits including, good and bad cholesterol, triglyceride, electrolytes, such as sodium and potassium, liver and kidney function tests. White and red blood cell counts were also normal, so no concern of Anemia.    You don't have diabetes. But your total cholesterol was slightly high.  I would recommend lifestyle changes: including weight loss, regular exercise with walking at least 30 minutes a day for 5 days in a week. Balanced low calorie or energy diet with more vegetables, lean proteins, less sweets, bread, rice, pasta, fried food, sugary foods and drinks. And keeping food and exercise journal. I would recommend repeating tests in 3-6 months to monitor.     Please contact my office should you have any questions.    Sincerely,  Dr. [name]."     Please print and include results in the letter.   (MD signature is not needed)  Thanks   WM

## 2016-05-19 ENCOUNTER — Ambulatory Visit: Payer: Self-pay | Admitting: Internal Medicine

## 2016-05-19 DIAGNOSIS — R102 Pelvic and perineal pain unspecified side: Secondary | ICD-10-CM

## 2016-05-19 LAB — US PELVIC NON-OB W TRANSVAG, 3D, DUPLEX

## 2016-05-19 NOTE — Addendum Note (Signed)
Addended by: Ulyses Southward on: 05/19/2016 01:23 PM     Modules accepted: Orders

## 2016-05-19 NOTE — Progress Notes (Signed)
Please send letter (do not include details of result):    Your x-ray of the pelvis is normal.

## 2016-05-21 ENCOUNTER — Telehealth (HOSPITAL_BASED_OUTPATIENT_CLINIC_OR_DEPARTMENT_OTHER): Payer: Self-pay | Admitting: Registered Nurse

## 2016-05-21 NOTE — Progress Notes (Signed)
Pt advised of provider message. No further questions or concerns.   Lannie Fields, RN, 05/21/2016, 3:28 PM

## 2016-05-21 NOTE — Telephone Encounter (Signed)
-----   Message from [place] sent at 05/21/2016  1:49 PM EDT -----  Regarding: results  Alejandra Martin [id number], 46 year old, female    Calls today:  Clinical Questions (NON-SICK CLINICAL QUESTIONS ONLY)    Specific nature of request states she is concerned- needs recent lab and [name] results.   Return phone number [phone]  Person calling on behalf of patient: Patient (self)    Patient's language of care: English    Patient's PCP: Janann August, MD

## 2016-05-21 NOTE — Progress Notes (Signed)
It was great seeing you during your recent clinic visit.   Your test result overall was good. Your tests were within normal limits including, good and bad cholesterol, triglyceride, electrolytes, such as sodium and potassium, liver and kidney function tests. White and red blood cell counts were also normal, so no concern of Anemia.    You don't have diabetes. But your total cholesterol was slightly high.  I would recommend lifestyle changes: including weight loss, regular exercise with walking at least 30 minutes a day for 5 days in a week. Balanced low calorie or energy diet with more vegetables, lean proteins, less sweets, bread, rice, pasta, fried food, sugary foods and drinks. And keeping food and exercise journal. I would recommend repeating tests in 3-6 months to monitor.     Please contact my office should you have any questions.    Sincerely,  Dr. [name]."

## 2016-05-29 ENCOUNTER — Ambulatory Visit (HOSPITAL_BASED_OUTPATIENT_CLINIC_OR_DEPARTMENT_OTHER): Payer: PRIVATE HEALTH INSURANCE | Admitting: Gastroenterology

## 2016-05-29 VITALS — BP 143/87 | HR 78 | Temp 97.8°F | Resp 14 | Ht 63.0 in | Wt 277.0 lb

## 2016-05-29 DIAGNOSIS — K22719 Barrett's esophagus with dysplasia, unspecified: Secondary | ICD-10-CM

## 2016-05-29 DIAGNOSIS — K219 Gastro-esophageal reflux disease without esophagitis: Secondary | ICD-10-CM

## 2016-05-29 NOTE — Patient Instructions (Signed)
Verbal and written egd prep instructions reviewed with pt. Procedure to be done with anesthesia      Upper Endoscopy Prep    Upper endoscopy, also known as a gastroscopy or EGD, is a procedure that uses an endoscope (a black flexible, lighted tube with a camera) to evaluate your esophagus, stomach and the first part of your small intestine (the duodenum). The endoscope allows direct viewing of a variety of diseases that can affect these areas. It can detect inflamed tissue, ulcers and abnormal growths. It often sees details that cannot be seen on x-ray examinations. Your doctor may take biopsies (small pieces of tissue) for analysis of abnormalities that they want to check in more detail.            To Prepare  You should not use Ibuprofen, Motrin, Advil, Naprosyn, Aleve or any other nonsteroidal antinflammatory agent a week before the test. Tylenol is ok for moderate pain relief. If you are on blood thinners (such as Aspirin, Warfarin, Coumadin, Heparin or Plavix) you must ask your doctor or nurse before stopping these medications!!! This important if you take your medicines for heart disease, stroke disease, a pulmonary embolus, artificial heart valves, a heart stent or a vascular stent and any other problems that could lead to a serious clot forming, stroke or heart attack. You may need to taper these medications depending on what is to be done. Ask your doctor!          You Should Not Eat Anything After Midnight The Night Before The Test.      Your medications:  The morning of the test, you can take your important medications (blood pressure pills, seizure medications, heart medications, thyroid medications, etc) at the usual time with a small sip of water.     Don’t take vitamins, antacids, supplements, pills for diabetes or iron pills the morning of the test.    If you have Type 2 Diabetes:    On the night before the test: Do not take diabetes pills.  If you take insulin for type 2 diabetes, take ½ your  usual long acting insulin.  For example: if you usually take 40 units of Lantus or NPH, take 20 units instead.    On the morning of the test: Do not take diabetes pills. If you are on long-acting insulin for type 2 diabetes, you can take half the dose. For example if you are on 40 units of Lantus or NPH, take 20 units instead.     After the test: You will be allowed to eat again. At that time, should resume take your diabetes pills at your usual times. Take your usual evening dose of insulin.      If you have Type 1 Diabetes:    On the night before the test: Just take your usual basal insulin dose that you would normally take at that time (for example, your usual full dose of Lantus or NPH).    On the morning of the test: Take your usual basal insulin dose that you would normally take at that time (for example, your usual full dose of Lantus or NPH).      If you have any questions about your medications ask your own doctor or nurse.    Getting Ready At The Hospital    On the day of your test, a nurse will ask you some information about your health history.  You will be put into a hospital gown. A small intravenous needle   will be inserted in the back of your hand or forearm to give you medications that will make you comfortable during the test. We may also ask you to gargle with some medication to anesthetize the back of your throat. In some people this reduces the chance of gagging, but may make your throat feel numb.    In the procedure room, you will be asked to lie down in on your left side. Please inform the doctor if this is uncomfortable for you. You will be started on oxygen which is routine. During the test you will be able to breathe normally around a plastic mouthpiece on which you can rest your teeth. You will then be asked to gently swallow the tube. The tube will be inserted into your mouth and advanced through your esophagus, stomach and upper small intestine.  The doctor will try and look at the inner  walls of these areas. The outer wall and organs outside the esophagus, stomach and duodenum are not visible with this test.    You will be given medication through the IV in order to control discomfort and help you relax. You may sleep or be partially awake during the test. ). A small amount of air is used to expand the stomach and duodenum so that the doctor can see. Secretions are removed by suction.  Sometimes, you might feel a gassy or gagging sensation. We will monitor you and try to make you as comfortable as possible.     Possible Complications    Complications are unusual during or after the test but they can happen. The most common risks include perforation (a tear in the throat, esophagus, stomach and small bowel), bleeding, respiratory problems, blood pressure problems, heart problems, discomfort and adverse reactions to the medications used.  A perforation may result in the need for emergency surgery. Also note, upper endoscopy like other medical tests is not perfect. It may not detect problems such as ulcers, cancers and other diseases up to 2 to 6% of the time. Luckily, the combined risk of all of these problems is small.    After the Test    You may feel bloated from air which was put into your stomach and intestine during the test. You may also feel a little drowsy from the medications. You cannot drive or operate heavy machinery or do any important work for the rest of the day. You should plan on resting, watching TV or reading light material after the test. You may forget things that happen during and directly after the test. It is important have someone with you that can remind you of any instructions we give.    Depending on what is found, you may need to have the endoscopy repeated. Usually, this is done several years later but it may need to be done much sooner. Talk to your doctor about when you should have repeat study or other testing.    You will usually be at the hospital between 2-3 hours  (although sometimes it can take longer).  We will make sure that you are alright before sending you home. You must arrange for someone to drive you home after the test.  Once again, we will not perform the test unless you have an arranged ride. You cannot go home in a taxi or a bus.    Endoscopy is a safe and effective test that is commonly done at our facilities. You may receive a call to remind you of the date and   time of your test. If you have any questions, please feel free to call.     For questions about the test itself, call [phone].    For questions about the date and time of your test, or to change the date or time, call [phone]    For questions regarding your regular medications or health issues, please call your doctor.

## 2016-05-29 NOTE — Progress Notes (Signed)
This 46 year old English speaking patient presents for follow up of her Barrett's esophagitis.    She complains of some mild dyspepsia. She has known Barrett's and is due for a follow up.     The patient denied dysphagia, odynophagia, early satiety, weight loss, nausea, vomiting, hematemesis, hematochezia, melena, diarrhea, bowel urgency, constipation, fever, or jaundice.      Past Medical History     Allergy, unspecified not elsewhere classified     Acute gastritis without mention of hemorrhage     Intestinal infection due to Clostridium difficile     Pure hypercholesterolemia     Major depressive disorder, single episode, moderate (HCC)     Generalized anxiety disorder     Benign positional vertigo     Contraceptive management     Tubular adenoma of colon     GERD (gastroesophageal reflux disease)     Diarrhea     Cholecystitis     Epigastric abdominal pain     Mass of ampulla of Vater     Gastroesophageal reflux disease with esophagitis     Obesity     Colon polyp     Mucosal abnormality of duodenum     Abnormal LFTs     Allergic rhinitis due to pollen     Anisometropic amblyopia of right eye     Barrett's esophagus     Disorder of biliary tract     Gastroesophageal reflux disease     Mild intermittent asthma without complication     Prediabetes     Tinea pedis of both feet      Past Surgical History:  No date: CHOLECYSTECTOMY  No date: NO SIGNIFICANT SURGICAL HISTORY  No date: OB ANTEPARTUM CARE CESAREAN DLVR & POSTPARTUM      Review of Patient's Allergies indicates:   Erythromycin            Itching   Erythromycin            Itching   Macrolides and keto*    Itching, Rash    Social Hx  Smoking status: Never Smoker                                                              Smokeless tobacco: Never Used                      Alcohol use: No                  Current Outpatient Prescriptions:  omeprazole (PRILOSEC) 40 MG capsule Take 1 capsule by mouth daily Disp: 30 capsule Rfl: 1   albuterol (PROVENTIL  HFA,VENTOLIN HFA, PROAIR HFA) 108 (90 BASE) MCG/ACT inhaler Inhale 2 puffs into the lungs 2 (two) times daily. Disp:  Rfl:    cetirizine (ZYRTEC) 10 MG tablet Take 10 mg by mouth daily. Disp:  Rfl:      No current facility-administered medications for this visit.     Family Hx    Family History    OTHER Father     Comment: leukemia     Diabetes Mother     Hypertension Mother     Lipids Mother     OTHER Mother     Comment: ESRD on HD  Hypertension Father     Hypertension Brother          REVIEW OF SYSTEMS:    Cardiovascular:  No chest pain, palpitations, MI, heart failure,  Pulmonary:  No TB or asthma.  She had pneumonia.    Neuro:  No stroke, seizure or loss of consciousness.     Endocrine:  No diabetes or thyroid disease        Physical Exam:  Vital Signs: BP 143/87 (Site: LA)  Pulse 78  Temp 97.8 F (36.6 C) (Temporal)  Resp 14  Ht _0  (1.6 m)  Wt 125.6 kg (277 lb)  SpO2 97%  BMI 49.07 kg/m2 Body mass index is 49.07 kg/m.  General: Obesity body habitus.   HEENT: Normocephalic, atrumatic, PERRLA, Extra occular motions intact. No scleral icterus. Pharynx benign. Tongue midline.  LN: Without cervical, supraclavicular or infraclavicular lymphadenopathy.  Pulmonary: Clear to auscultation and percussion. No rales, wheezes or rhonchi.  Cardiovascular: S1, S2, no S3 or murmurs.   Abdominal: Normal bowel sounds. No tenderness on light or deep palpation. No hepatomegaly or splenomegaly.  Neurological: Sensation intact. Normal motor function. Normal gait. Alert and oriented x 3    Pertinent Labs:  Results for JACKYE, DEVER (MRN [id number]) as of 05/29/2016 12:01   Ref. Range 05/14/2016 08:35   SODIUM Latest Ref Range: 136 - 145 mmol/L 141   POTASSIUM Latest Ref Range: 3.5 - 5.1 mmol/L 4.2   CHLORIDE Latest Ref Range: 98 - 107 mmol/L 104   CARBON DIOXIDE Latest Ref Range: 21 - 32 mmol/L 27   ANION GAP Latest Ref Range: 5 - 15 mmol/L 10   CALCIUM Latest Ref Range: 8.5 - 10.1 mg/dL 8.9   Glucose Random Latest Ref Range: 74  - 160 mg/dL 91   BUN (UREA NITROGEN) Latest Ref Range: 7 - 18 mg/dL 15   CREATININE Latest Ref Range: 0.4 - 1.2 mg/dL 0.9   ESTIMATED GLOMERULAR FILT RATE Latest Ref Range: >60 ML/MIN > 60   Cholesterol Latest Ref Range: 0 - 239 mg/dL 256 (*H)   TRIGLYCERIDES Latest Ref Range: 0 - 150 mg/dL 132   HIGH DENSITY LIPOPROTEIN Latest Ref Range: 40- mg/dL 42   LOW DENSITY LIPOPROTEIN DIRECT Latest Ref Range: 0 - 189 mg/dL 179   TOTAL PROTEIN Latest Ref Range: 6.4 - 8.2 g/dL 7.6   ALBUMIN Latest Ref Range: 3.4 - 5.0 g/dL 3.7   BILIRUBIN TOTAL Latest Ref Range: 0.2 - 1.0 mg/dL 0.4   ALKALINE PHOSPHATASE Latest Ref Range: 45 - 117 U/L 114   ASPARTATE AMINOTRANSFERASE Latest Ref Range: 8 - 34 U/L 15   ALANINE AMINOTRANSFERASE Latest Ref Range: 12 - 45 U/L 20   HEMOGLOBIN A1C Latest Ref Range: 4.0 - 5.6 % 5.5   THYROID SCREEN TSH REFLEX FT4 Latest Ref Range: 0.358 - 3.740 uIU/mL 2.510   WHITE BLOOD CELL COUNT Latest Ref Range: 4.0 - 11.0 TH/uL 7.3   RED BLOOD CELL COUNT Latest Ref Range: 3.90 - 5.20 M/uL 4.48   HEMOGLOBIN Latest Ref Range: 11.2 - 15.7 g/dL 12.7   HEMATOCRIT Latest Ref Range: 34.1 - 44.9 % 40.2   MEAN CORPUSCULAR VOL Latest Ref Range: 80.0 - 100.0 fL 89.7   MEAN CORPUSCULAR HGB Latest Ref Range: 26.0 - 34.0 pg 28.3   MEAN CORP HGB CONC Latest Ref Range: 31.0 - 37.0 g/dL 31.6   RBC DISTRIBUTION WIDTH STD DEV Latest Ref Range: 35.1 - 46.3 fL 46.3   RBC DISTRIBUTION WIDTH Latest Ref Range:  11.5 - 14.3 % 14.1   PLATELET COUNT Latest Ref Range: 150 - 400 TH/uL 292   MEAN PLATELET VOLUME Latest Ref Range: 8.7 - 12.5 fL 10.3   ESTIMATED AVERAGE GLUCOSE Latest Ref Range: 74 - 160  111       Impression:  Barrett's esophagus with dysplasia  Gastroesophageal reflux disease, esophagitis presence not specified    Medical Decision Making:  The patient has Barrett's and we will do her EGD at this time. The patient was informed of the risk of bleeding, discomfort, perforation, a need for surgery, infection, drug reaction,  cardiovascular and cerebrovascular compromise and the possibility of missing a lesion or problem.  She understood these risks and consented to the exam.  She may have sleep apnea. Will do with MAC.    Reviewed with patient who agrees with our plan.

## 2016-06-10 ENCOUNTER — Ambulatory Visit: Payer: Self-pay | Admitting: Internal Medicine

## 2016-06-10 DIAGNOSIS — Z1239 Encounter for other screening for malignant neoplasm of breast: Secondary | ICD-10-CM

## 2016-06-10 NOTE — Addendum Note (Signed)
Addended by: Rowan Blase on: 06/10/2016 12:18 PM     Modules accepted: Orders

## 2016-06-11 ENCOUNTER — Ambulatory Visit (HOSPITAL_BASED_OUTPATIENT_CLINIC_OR_DEPARTMENT_OTHER): Payer: PRIVATE HEALTH INSURANCE | Admitting: Internal Medicine

## 2016-06-11 ENCOUNTER — Encounter (HOSPITAL_BASED_OUTPATIENT_CLINIC_OR_DEPARTMENT_OTHER): Payer: Self-pay | Admitting: Internal Medicine

## 2016-06-11 VITALS — BP 114/90 | HR 70 | Temp 98.0°F | Ht 63.0 in | Wt 275.0 lb

## 2016-06-11 DIAGNOSIS — R102 Pelvic and perineal pain unspecified side: Secondary | ICD-10-CM

## 2016-06-11 DIAGNOSIS — J4520 Mild intermittent asthma, uncomplicated: Secondary | ICD-10-CM

## 2016-06-11 DIAGNOSIS — K2100 Gastro-esophageal reflux disease with esophagitis, without bleeding: Secondary | ICD-10-CM

## 2016-06-11 DIAGNOSIS — N926 Irregular menstruation, unspecified: Secondary | ICD-10-CM

## 2016-06-11 DIAGNOSIS — K22719 Barrett's esophagus with dysplasia, unspecified: Secondary | ICD-10-CM

## 2016-06-11 DIAGNOSIS — J301 Allergic rhinitis due to pollen: Secondary | ICD-10-CM

## 2016-06-11 DIAGNOSIS — IMO0001 Reserved for inherently not codable concepts without codable children: Secondary | ICD-10-CM

## 2016-06-11 NOTE — Progress Notes (Signed)
Alejandra Martin is a 46 year old female presenting with:    Cont to have irregular period and pelvic cramping  Once or twice a week  No symptoms this week  Three times last week  Takes Tylenol, which helps  Last period was last month, for one day  And 3 days in February.    c/o intermittent wheezing and sob.  Some cough without productive sputum,   Feels worse with acid reflex,  Have bitter taste in the back of tongue/ mouth  She is scheduled with GI for repeat EGD to monitor Barrett's    Sob and wheezing gets better with albuterol  Denies chest pain, leg swelling.         Additional ROS:  All others reviewed and negative.    Past Medical History:  06/12/2004: ACUTE GASTRITIS W/O HEMORRHAGE      Comment: H. Pylori positive (done at [facility]); GI                consult Dr. Phillis Martin, TOC 10/01/04  03/26/2004: ALLERGY, UNSPECIFIED      Comment: Seen by ENT, deviated septum, hypertrophy of                inferior nasal turbinates  No date: Anxiety  07/26/2004: CLOSTRIDIUM DIFFICILE      Comment: GI cosult: Dr. Rollene Martin, TOC done on 10/01/04  No date: Elevated cholesterol      Comment: resolved without meds  No date: Esophageal reflux  No date: Snores  No date: Stomach disease  No date: Unspecified asthma(493.90)  No date: Wears eyeglasses      Comment: night driving  Past Surgical History:  No date: CHOLECYSTECTOMY  No date: NO SIGNIFICANT SURGICAL HISTORY  No date: OB ANTEPARTUM CARE CESAREAN DLVR & POSTPARTUM    Family History    OTHER Father     Comment: leukemia     Diabetes Mother     Hypertension Mother     Lipids Mother     OTHER Mother     Comment: ESRD on HD    Hypertension Father     Hypertension Brother        Social History  Social History   Marital status: Married  Spouse name: N/A    Years of education: N/A  Number of children: N/A     Occupational History  None on file     Social History Main Topics   Smoking status: Never Smoker    Smokeless tobacco: Never Used    Alcohol use No    Drug use: No    Sexual  activity: Yes    Partners: Male    Comment: men age 69 q 4-6 wks x 3d, small fibroid on u/s, 1st SA age 52, 2 lifetime partners, current x 8 yrs     Other Topics Concern   None on file     Social History Narrative    07/09/2005    [name] family [place] age 36, husband from [place]    Works FT - [employer]    Pt thrilled with pregnancy-seeking pregnancy x 4 yrs, very anxious since sab 2 yrs ago    Has one child    Not working now    Lives with husband and daughter    Alejandra Revere, MD, 05/13/2016, 1:50 PM         Immunization History   Administered Date(s) Administered    HEP B ADULT 3  DOSE 20 and > 04/05/2004, 05/03/2004, 10/07/2004    INFLUENZA VIRUS VAC QUAD LIVE INTRANASAL 2-<23YRS 12/27/2008, 01/02/2010, 11/25/2012    Influenza Virus Tri Presv Free 3/> YRS IM 01/20/2006    Pneumococcal,NOS 01/02/2010    Td 04/03/2004, 02/21/2009    Tdap (adacel) 08/05/2012       Current Outpatient Prescriptions:  omeprazole (PRILOSEC) 40 MG capsule Take 1 capsule by mouth daily Disp: 30 capsule Rfl: 1   albuterol (PROVENTIL HFA,VENTOLIN HFA, PROAIR HFA) 108 (90 BASE) MCG/ACT inhaler Inhale 2 puffs into the lungs 2 (two) times daily. Disp:  Rfl:    cetirizine (ZYRTEC) 10 MG tablet Take 10 mg by mouth daily. Disp:  Rfl:      No current facility-administered medications for this visit.     Physical exam:  BP 114/90  Pulse 70  Temp 98 F (36.7 C) (Temporal)  Ht 5\' 3"  (1.6 m)  Wt 124.7 kg (275 lb)  LMP 04/17/2016  SpO2 99%  BMI 48.71 kg/m2    Constitutional: Not in acute distress.  HEENT: Normal conjunctiva and intact EOM. PERRLA.   Nasal turbinates are normal. Oropharynx is clear and moist. No oropharyngeal exudate. Intact TM.    Cardiovascular: Regular rate, rhythm, normal S1/ S2 heart sounds. No gallop, friction rub, or murmur.  Pulmonary/Chest: Effort normal and breath sounds normal. No respiratory distress. No tenderness. No wheezes, rales.  Abdominal: Soft. Bowel sounds are  normal. No distension or mass. There is no rebound or guarding. slight epigastric tenderness.  Musculoskeletal: No edema.    Neurological:  Alert and oriented. Grossly intact.   Psychiatric: Normal mood and affect. Thought content appears to be intact.       Assessment and Plan:  (R10.2) Pelvic cramping  (primary encounter diagnosis)  (N92.6) Irregular menstruation  Comment:   could be perimenopausal states, reassuring recent blood work and pelvic US on 05/19/16:  "Findings: Transabdominal, endovaginal, and 3-D rendered images were    acquired in order to better evaluate the uterus and adnexal    structures. The uterus is 7.2 x 4.1 x 4.8 cm. Unremarkable uterine    morphology and orientation. The endometrial thickness is 5 mm.    The right ovary is 2.6 x 3.8 x 1.9 cm. Unremarkable ovarian    morphology. Arterial and venous flow seen on color Doppler and spectral waveform analysis.    The left ovary is 2.1 x 1.7 x 3 cm. Unremarkable ovarian morphology.    Trivial venous flow seen on color Doppler and spectral waveform    analysis.    No significant free fluid in the pelvis. No hydronephrosis.    Impression: Unremarkable pelvic evaluation."  Normal PAP/ HPV on 12/10/12  no evidence for infection etiology clinically today.  In light of persistent symptoms and concern from the pt, would be reasonable to connect pt with GYN team   Plan: [place] ([place])         (K22.719) Barrett's esophagus with dysplasia  (K21.0) Gastroesophageal reflux disease with esophagitis  With history of GERD that is ongoing s/p treatmetn for H. Pylori and likely related in part to the patient's weight. Endoscopy performed 04/16 at [facility] with Barretts metaplasia but no dysplasia, per notes repeat is due in 2019.  Patient has since started seeing GI at [facility]. Endoscopy repeated 7/17 due to persistent symptoms. Notable for e/o chronic gastritis but normal appearing esophagus.   Counseled on wt loss and lifestyle changes   To  continue on omeprazole per GI.  Cont to f/u with GI team, Next EGD planned for 06/18/16    (E66.09,  Z68.42) Class 3 obesity due to excess calories without serious comorbidity with body mass index (BMI) of 45.0 to 49.9 in adult [facility])  Most Recent Weight Reading(s)  06/11/16 : 124.7 kg (275 lb)  05/29/16 : 125.6 kg (277 lb)  05/13/16 : 124 kg (273 lb 6.4 oz)  03/01/15 : 123.4 kg (272 lb)  10/03/14 : 124.3 kg (274 lb)    I would recommend lifestyle changes: including weight loss, regular exercise with walking at least 30 minutes a day for 5 days in a week. Balanced low calorie or energy diet with more vegetables, lean proteins, less sweets, bread, rice, pasta, fried food, sugary foods and drinks. Increase water intake to keep well hydrated. And keeping food and exercise journal.       (J45.20) Mild intermittent asthma without complication  (W80.3) Chronic seasonal allergic rhinitis due to pollen  Controlled  Cont Alb PRN for now  Cont Cetirizine for seasonal allergies/ rhinitis         We discussed the patients current medications. The patient expressed understanding and no barriers to adherence were identified.   1. The patient indicates understanding of these issues and agrees with the plan. Brief care plan is updated and reviewed with the patient.   2. The patient is given an After Visit Summary sheet that lists all medications with directions, allergies, orders placed during this encounter, and follow-up instructions.   3. I reviewed the patient's medical information and medical history.   4. I reconciled the patient's medication list and prepared and supplied needed refills.   5. I have reviewed the past medical, family, and social history sections including the medications and allergies.     Alejandra Revere, MD, MPH, [facility]

## 2016-06-13 LAB — MA NON ~~LOC~~ IMAGES

## 2016-06-13 LAB — MA SCREENING MAMMO BILATERAL DIGITAL WITH DBT & CAD

## 2016-06-14 ENCOUNTER — Encounter (HOSPITAL_BASED_OUTPATIENT_CLINIC_OR_DEPARTMENT_OTHER): Payer: Self-pay

## 2016-06-14 ENCOUNTER — Emergency Department (HOSPITAL_BASED_OUTPATIENT_CLINIC_OR_DEPARTMENT_OTHER)
Admission: RE | Admit: 2016-06-14 | Disposition: A | Discharge status: Home / Self Care | Payer: Self-pay | Source: Emergency Department | Attending: Emergency Medicine | Admitting: Emergency Medicine

## 2016-06-14 HISTORY — DX: Unspecified asthma, uncomplicated: J45.909

## 2016-06-14 LAB — TROPONIN I
TROPONIN I: <0.02 ng/mL (ref 0.00–0.04)
TROPONIN I: <0.02 ng/mL (ref 0.00–0.04)

## 2016-06-14 LAB — COMPREHENSIVE METABOLIC PANEL
ALANINE AMINOTRANSFERASE: 24 U/L (ref 12–45)
ALBUMIN: 3.7 g/dL (ref 3.4–5.0)
ALKALINE PHOSPHATASE: 103 U/L (ref 45–117)
ANION GAP: 10 mmol/L (ref 5–15)
ASPARTATE AMINOTRANSFERASE: 16 U/L (ref 8–34)
BILIRUBIN TOTAL: 0.2 mg/dL (ref 0.2–1.0)
BUN (UREA NITROGEN): 15 mg/dL (ref 7–18)
CALCIUM: 8.9 mg/dL (ref 8.5–10.1)
CARBON DIOXIDE: 27 mmol/L (ref 21–32)
CHLORIDE: 104 mmol/L (ref 98–107)
CREATININE: 1.1 mg/dL (ref 0.4–1.2)
ESTIMATED GLOMERULAR FILT RATE: 53 mL/min — ABNORMAL LOW (ref >60)
Glucose Random: 114 mg/dL (ref 74–160)
POTASSIUM: 3.8 mmol/L (ref 3.5–5.1)
SODIUM: 141 mmol/L (ref 136–145)
TOTAL PROTEIN: 7.3 g/dL (ref 6.4–8.2)

## 2016-06-14 LAB — CBC, PLATELET & DIFFERENTIAL
ABSOLUTE BASO COUNT: 0 10*3/uL (ref 0.0–0.1)
ABSOLUTE EOSINOPHIL COUNT: 0.1 10*3/uL (ref 0.0–0.8)
ABSOLUTE IMM GRAN COUNT: 0.02 10*3/uL (ref 0.00–0.03)
ABSOLUTE LYMPH COUNT: 3.1 10*3/uL (ref 0.6–5.9)
ABSOLUTE MONO COUNT: 0.5 10*3/uL (ref 0.2–1.4)
ABSOLUTE NEUTROPHIL COUNT: 6.9 10*3/uL (ref 1.6–8.3)
BASOPHIL %: 0.3 % (ref 0.0–1.2)
EOSINOPHIL %: 0.5 % (ref 0.0–7.0)
HEMATOCRIT: 38.7 % (ref 34.1–44.9)
HEMOGLOBIN: 12.4 g/dL (ref 11.2–15.7)
IMMATURE GRANULOCYTE %: 0.2 % (ref 0.0–0.4)
LYMPHOCYTE %: 29.3 % (ref 15.0–54.0)
MEAN CORP HGB CONC: 32 g/dL (ref 31.0–37.0)
MEAN CORPUSCULAR HGB: 28.1 pg (ref 26.0–34.0)
MEAN CORPUSCULAR VOL: 87.8 fL (ref 80.0–100.0)
MEAN PLATELET VOLUME: 9.7 fL (ref 8.7–12.5)
MONOCYTE %: 4.8 % (ref 4.0–13.0)
NEUTROPHIL %: 64.9 % (ref 40.0–75.0)
PLATELET COUNT: 275 10*3/uL (ref 150–400)
RBC DISTRIBUTION WIDTH STD DEV: 42.7 fL (ref 35.1–46.3)
RBC DISTRIBUTION WIDTH: 13.5 % (ref 11.5–14.3)
RED BLOOD CELL COUNT: 4.41 M/uL (ref 3.90–5.20)
WHITE BLOOD CELL COUNT: 10.7 10*3/uL (ref 4.0–11.0)

## 2016-06-14 LAB — LIPASE: LIPASE: 113 U/L (ref 73–393)

## 2016-06-14 LAB — XR CHEST 2 VIEWS

## 2016-06-14 MED ORDER — IBUPROFEN 800 MG PO TABS
800.00 mg | ORAL_TABLET | Freq: Once | ORAL | Status: AC
Start: 2016-06-14 — End: 2016-06-14
  Administered 2016-06-14: 800 mg via ORAL
  Filled 2016-06-14: qty 1

## 2016-06-14 MED ORDER — FAMOTIDINE 20 MG/2ML IV SOLN
20.00 mg | Freq: Once | INTRAVENOUS | Status: AC
Start: 2016-06-14 — End: 2016-06-14
  Administered 2016-06-14: 20 mg via INTRAVENOUS
  Filled 2016-06-14: qty 2

## 2016-06-14 MED ORDER — LIDOCAINE 5 % EX PTCH
1.0000 | MEDICATED_PATCH | Freq: Once | CUTANEOUS | Status: DC
Start: 2016-06-14 — End: 2016-06-14
  Administered 2016-06-14: 1 via TOPICAL
  Filled 2016-06-14: qty 1

## 2016-06-14 MED ORDER — ALUMINUM & MAGNESIUM HYDROXIDE 200-200 MG/5ML PO SUSP
10.00 mL | Freq: Four times a day (QID) | ORAL | 0 refills | Status: AC | PRN
Start: 2016-06-14 — End: 2016-06-17

## 2016-06-14 MED ORDER — ALUMINUM & MAGNESIUM HYDROXIDE 200-200 MG/5ML PO SUSP
30.00 mL | Freq: Once | ORAL | Status: AC
Start: 2016-06-14 — End: 2016-06-14
  Administered 2016-06-14: 30 mL via ORAL
  Filled 2016-06-14: qty 30

## 2016-06-14 MED ORDER — ALUMINUM & MAGNESIUM HYDROXIDE 200-200 MG/5ML PO SUSP: 10 mL | mL | Freq: Four times a day (QID) | ORAL | 0 refills | 0 days | Status: AC | PRN

## 2016-06-14 NOTE — ED Notes (Signed)
Working with PA-C:  Burman Freestone  I performed a history and physical examination of the patient and discussed the patient's management, reviewed the PA's note, and agree with the documented finding and plan of care.      Briefly, this 46 year old female patient c/o "tightness" "Pressure in chest, upper abd pain, reflux, trouble taking deep breath.  Taking usual omeprazole, did eat reflux triggering foods recently.   No f/c, no cough, not worse when supine or with exertion, though epig discomfort worse when supine.  No NVD    I met and examined this patient myself.  - Agree with Nichole's exam  99%SpO2    ED course/Medical Decision-Making:    46 year old female with reported remote history of asthma-like symptoms with prior URI and ongoing history of acid reflux treated with omeprazole who also ate recent reflux triggering foods, presents with a tightness and pressure in her chest, as well as in the left scapular area associated with epigastric pain, and dyspnea that is not worse with supine or exertion to suggest CHF or CAD.  Not on hormone/contraceptive, not a smoker, no personal or family history of blood clots, and no signs or symptoms of DVT.  No recent obvious trauma/musculoskeletal strain trigger, though this could cause her symptoms, and this is most likely related to gastrointestinal symptoms, acid reflux with exacerbation.    I interpreted the EKG-  Normal sinus rhythm at 79/minute, normal axis, intervals, R-wave progression in V4.  No ST elevation or depression.  No pathologic Q waves, or QRS morphology.  T-wave flattening in L, and somewhat in 1.  No acute ischemia.    Chest x-ray PA and lateral-  No focal infiltrate or effusion.    Laboratory evaluation, including LFTs, lipase and troponin all reassuring, though GFR 53, previously over 60 back in March 2018.    Aluminum/magnesium hydroxide, famotidine    Reevaluation  With improvement    Condition on discharge:  Improved and  stable    Diagnosis/diagnoses:  Atypical chest pain  Gastroesophageal reflux disease, esophagitis presence not specified    Judson Roch, MD FACEP

## 2016-06-14 NOTE — Narrator Note (Signed)
Report from [place], [place]. PA at bedside.

## 2016-06-14 NOTE — Discharge Instructions (Signed)
DIAGNOSIS & TREATMENT:  You were seen in a [facility] Emergency Department for heartburn-like symptoms.     FURTHER CARE:  Take the Maalox as needed for pain relief. Avoid spicy foods, alcohol and use NSAIDs sparingly if possible. If you are already taking omeprazole then take the omeprazole before your meal and take the pepcid after. .     WHEN SHOULD YOU BE SEEN NEXT?   Please call your doctor and be seen with in the next 5 days for re-evaluation. If you do not have a primary care doctor or would like to transfer your primary care to [facility], please call [phone] to set one up an appointment.    WHEN SHOULD YOU RETURN TO THE ED?  Please return to the emergency room if you develop fever, vomiting which prevents you from tolerating fluids, your symptoms worsen, you get new symptoms or you are unable to schedule follow up care.

## 2016-06-14 NOTE — Narrator Note (Signed)
This RN in to discharge pt and she is very concerned that " we have missed something in her heart " thorough information and education provided by this RN. EKG and troponin reviewed. And pt does not want to leave. MD made a ware and 3 hour trop is being drawn at this time . Pt updated.

## 2016-06-14 NOTE — Narrator Note (Signed)
Pt reports GERD symptoms are "better" and is stating she is hungry.

## 2016-06-14 NOTE — Narrator Note (Signed)
Administered ibuprofen and placed a lido patch per orders for c/o "sharp" pain in mid-back. Will cont to monitor

## 2016-06-14 NOTE — Narrator Note (Signed)
Patient Disposition    Patient education for diagnosis, medications, activity, diet and follow-up.  Patient left ED 7:39 PM.  Patient rep received written instructions.  Interpreter to provide instructions: No    Patient belongings with patient: YES    Have all existing LDAs been addressed? Yes    Have all IV infusions been stopped? Yes    Discharged to: Discharged to home. Pt to follow up with PCP. Pt verbalized understanding of medication, home care, and when to return to the ED. Ambulated out with a steady gait.

## 2016-06-14 NOTE — Narrator Note (Signed)
Pt to xray

## 2016-06-14 NOTE — Narrator Note (Signed)
Pt is complaining of "sharp" pain in back. Requesting motrin. Will f/u with provider.

## 2016-06-14 NOTE — ED Triage Note (Signed)
Pt self presents to the ED with c/o "tightness" in chest when taking deep breaths for 4 days L>R. States she does not feel pain or pressure. Does c/o of increased GERD symptoms for 4 days. BLE edema, no pitting. Pt reports that is her baseline. Pt used albuterol inhaler "this morning" with good effect. Reports having seasonal allergies.

## 2016-06-14 NOTE — ED Provider Notes (Signed)
eMERGENCY dEPARTMENT Physician Assistant NOTE    The ED nursing record was reviewed.   The prior medical records as available electronically through Epic were reviewed.  The mode of arrival was Self on 06/14/2016  2:50 PM.      This patient was seen with Emergency Department attending physician [place]    Patient presents with:  VERIFY CHIEF COMPLAINT: BACK PAIN, SOB-ID SHOWN      HPI    Alejandra Martin is a 46 year old female with past medical history of GERD and gastritis, depression, cholecystitis, hypercholesterolemia, ? asthma, prediabetes who p/w complains of tightness in her chest, difficulty breathing, hoarseness in her voice and epigastric pain.  Patient reports she typically does get the symptoms with her reflux.  Although she does feel that tightness in her chest is not typical symptoms she feels with this.  She reports eating spicy foods and foods that typically aggravate her gastritis over the weekend.  She also notes some pain to the left posterior shoulder region which she states is not typical for her reflux.  She states she did have pain similar to that when she had a pneumonia in the past although she denies cough or hemoptysis, fevers or other cold-like symptoms.  She has not had nausea or vomiting with epigastric pain.  No diarrhea.      PAST MEDICAL HISTORY    Past Medical History:  06/12/2004: ACUTE GASTRITIS W/O HEMORRHAGE      Comment: H. Pylori positive (done at [facility]); GI                consult Dr. Phillis Knack, TOC 10/01/04  03/26/2004: ALLERGY, UNSPECIFIED      Comment: Seen by ENT, deviated septum, hypertrophy of                inferior nasal turbinates  No date: Anxiety  07/26/2004: CLOSTRIDIUM DIFFICILE      Comment: GI cosult: Dr. Rollene Rotunda, TOC done on 10/01/04  No date: Elevated cholesterol      Comment: resolved without meds  No date: Esophageal reflux  No date: Snores  No date: Stomach disease  No date: Unspecified asthma(493.90)  No date: Wears eyeglasses       Comment: night driving    PROBLEM LIST  Patient Active Problem List:     Allergy, unspecified not elsewhere classified     Acute gastritis without mention of hemorrhage     Intestinal infection due to Clostridium difficile     Pure hypercholesterolemia     Major depressive disorder, single episode, moderate (HCC)     Generalized anxiety disorder     Benign positional vertigo     Contraceptive management     Tubular adenoma of colon     GERD (gastroesophageal reflux disease)     Diarrhea     Cholecystitis     Epigastric abdominal pain     Mass of ampulla of Vater     Gastroesophageal reflux disease with esophagitis     Obesity     Colon polyp     Mucosal abnormality of duodenum     Abnormal LFTs     Allergic rhinitis due to pollen     Anisometropic amblyopia of right eye     Barrett's esophagus     Disorder of biliary tract     Gastroesophageal reflux disease     Mild intermittent asthma without complication     Prediabetes     Tinea pedis  of both feet      SURGICAL HISTORY    Past Surgical History:  No date: CHOLECYSTECTOMY  No date: NO SIGNIFICANT SURGICAL HISTORY  No date: OB ANTEPARTUM CARE CESAREAN DLVR & POSTPARTUM    CURRENT MEDICATIONS    No current facility-administered medications for this encounter.     Current Outpatient Prescriptions:   .  omeprazole (PRILOSEC) 40 MG capsule, Take 1 capsule by mouth daily, Disp: 30 capsule, Rfl: 1  .  albuterol (PROVENTIL HFA,VENTOLIN HFA, PROAIR HFA) 108 (90 BASE) MCG/ACT inhaler, Inhale 2 puffs into the lungs 2 (two) times daily., Disp: , Rfl:   .  cetirizine (ZYRTEC) 10 MG tablet, Take 10 mg by mouth daily., Disp: , Rfl:     ALLERGIES    Review of Patient's Allergies indicates:   Erythromycin            Itching   Erythromycin            Itching   Macrolides and keto*    Itching, Rash    FAMILY HISTORY      Family History    OTHER Father     Comment: leukemia     Diabetes Mother     Hypertension Mother     Lipids Mother     OTHER Mother     Comment: ESRD on HD     Hypertension Father     Hypertension Brother        SOCIAL HISTORY    Social History    Marital status: Married             Spouse name:                       Years of education:                 Number of children:               Social History Main Topics    Smoking status: Never Smoker                                                                Smokeless tobacco: Never Used                        Alcohol use: No              Drug use: No              Sexual activity: Yes               Partners with: Female       Comment: men age 22 q 4-6 wks x 3d, small fibroid                 on u/s, 1st SA age 29, 2 lifetime                 partners, current x 8 yrs    Social History Narrative    07/09/2005    To [name] with family at age 50, husband from [place]    Works FT - [employer]    Pt thrilled with pregnancy-seeking pregnancy x 4 yrs,  very anxious since sab 2 yrs ago    Has one child    Not working now    Lives with husband and daughter    Alton Revere, MD, 05/13/2016, 1:50 PM            REVIEW OF SYSTEMS    The pertinent positives are reviewed in the HPI above. All other systems were reviewed and are negative.    PHYSICAL EXAM      Vital Signs:      06/14/16  1511   BP: 136/83   Pulse: 98   Resp: 12   Temp: 98.1 F   SpO2: 99%   Weight: 104.3 kg (230 lb)     GENERAL: Alert and appropriate.  Non-toxic appearing.   SKIN: Warm and dry, no rash .   HEAD: Without signs of trauma. No soft tissue swelling or tenderness.   NECK: No tenderness, swelling, or step-off. Full   EYES: Pupils are equal and reactive. Extraocular movements are intact.  CV: RRR, No MRG, radial pulses 2+ B/L  PULMONARY:  CTAB, No WRR, No stridor, accessory muscle use or tripoding  ABDOMINAL: Soft, epigastric abd tenderness. ND, No rebound, guarding or masses  MUSCULOSKELETAL : The spine straight and nontender.   NEUROLOGIC: Normal mental status. Cranial nerves, motor, sensor, DTRs, and cerebellum are grossly  intact.   PSYCHIATRIC: Normal affect.      RESULTS  Results for orders placed or performed during the hospital encounter of 06/14/16 (from the past 24 hour(s))   CBC, Platelet & Differential    Collection Time: 06/14/16  3:26 PM   Result Value    WHITE BLOOD CELL COUNT 10.7    RED BLOOD CELL COUNT 4.41    HEMOGLOBIN 12.4    HEMATOCRIT 38.7    MEAN CORPUSCULAR VOL 87.8    MEAN CORPUSCULAR HGB 28.1    MEAN CORP HGB CONC 32.0    RBC DISTRIBUTION WIDTH STD DEV 42.7    RBC DISTRIBUTION WIDTH 13.5    PLATELET COUNT 275    MEAN PLATELET VOLUME 9.7    NEUTROPHIL % 64.9    IMMATURE GRANULOCYTE % 0.2    LYMPHOCYTE % 29.3    MONOCYTE % 4.8    EOSINOPHIL % 0.5    BASOPHIL % 0.3    ABSOLUTE NEUTROPHIL COUNT 6.9    ABSOLUTE IMM GRAN COUNT 0.02    ABSOLUTE LYMPH COUNT 3.1    ABSOLUTE MONO COUNT 0.5    ABSOLUTE EOSINOPHIL COUNT 0.1    ABSOLUTE BASO COUNT 0.0   Comprehensive Metabolic Panel    Collection Time: 06/14/16  3:26 PM   Result Value    SODIUM 141    POTASSIUM 3.8    CHLORIDE 104    CARBON DIOXIDE 27    ANION GAP 10    CALCIUM 8.9    Glucose Random 114    BUN (UREA NITROGEN) 15    TOTAL PROTEIN 7.3    ALBUMIN 3.7    BILIRUBIN TOTAL 0.2    ALKALINE PHOSPHATASE 103    ASPARTATE AMINOTRANSFERASE 16    CREATININE 1.1    ESTIMATED GLOMERULAR FILT RATE 53 (L)    ALANINE AMINOTRANSFERASE 24   Lipase    Collection Time: 06/14/16  3:26 PM   Result Value    LIPASE 113   Troponin I    Collection Time: 06/14/16  3:26 PM   Result Value    TROPONIN I < 0.02   Troponin I  Collection Time: 06/14/16  6:24 PM   Result Value    TROPONIN I < 0.02        RADIOLOGY  Normal chest x-ray    EKG:  Normal EKG, sinus rhythm  No acute ST-T wave changes        MEDICATIONS ADMINISTERED ON THIS VISIT    Medication Orders Placed This Encounter      aluminum-magnesium hydroxide (MAALOX) 200-200 mg/5 mL suspension 30 mL      famotidine (PEPCID) injection 20 mg      aluminum-magnesium hydroxide (MAALOX) 200-200 mg/5 mL suspension          Sig: Take 10 mLs  by mouth every 6 (six) hours as needed for Indigestion          Dispense:  120 mL          Refill:  0      ibuprofen (ADVIL,MOTRIN) tablet 800 mg      lidocaine (LIDODERM) 5 % patch 1 patch    ED COURSE & MEDICAL DECISION MAKING      Arrival: Pt arrived in stable condition and required no immediate interventions.    ED Course: Pt is a 46 year old female who presents to the ED with atypical chest discomfort/tightness, left upper back pain, and GERD/reflux symptoms. On exam, no acute distress, vitals unremarkable.  Patient is 46 years old without any medical history significant for concerning for pulmonary embolism.  No tachypnea or tachycardia, O2 is 99%.  Low risk for ACS given hypercholesterolemia and prediabetes, EKG was ordered which shows normal sinus rhythm with no ST or T-wave abnormalities.  Chest x-ray is also unremarkable.  Unclear etiology of her symptoms, could be secondary to her reflux as well as possible muscle strain.  Laboratory workup is notable for a negative troponin, symptoms started 3 days prior I do feel this is reasonable.  Lipase and abdominal laboratory workup are all unremarkable as well.  She was treated with a GI cocktail with improvement in symptoms.  Patient significantly worried about her heart, advised we can do a repeat troponin, however I do feel otherwise that this is very unlikely to be ACS given improvement with GI cocktail lack of findings.  Repeat troponin shows no elevation.  Patient reassured.  Discharged home with return precautions.Pt remained hemodynamically stable during their stay in the emergency department.     Lakindra Wible PA-C

## 2016-06-14 NOTE — Narrator Note (Signed)
Pt states that back pain is controlled. Denies SOB, states that she feels ok to go home. Respirations even and unlabored, equal chest rise, no apparent distress.

## 2016-06-17 ENCOUNTER — Ambulatory Visit (HOSPITAL_BASED_OUTPATIENT_CLINIC_OR_DEPARTMENT_OTHER): Payer: Self-pay

## 2016-06-17 NOTE — Progress Notes (Signed)
Alejandra Martin is a 46 year old female patient of Dr. Garth Bigness who presents today with ED f/u for SOB.    ED F/u  Was in ER for SOB 06/14/16:  "Pt is a 46 year old female who presents to the ED with atypical chest discomfort/tightness, left upper back pain, and GERD/reflux symptoms. On exam, no acute distress, vitals unremarkable.  Patient is 46 years old without any medical history significant for concerning for pulmonary embolism.  No tachypnea or tachycardia, O2 is 99%.  Low risk for ACS given hypercholesterolemia and prediabetes, EKG was ordered which shows normal sinus rhythm with no ST or T-wave abnormalities.  Chest x-ray is also unremarkable.  Unclear etiology of her symptoms, could be secondary to her reflux as well as possible muscle strain.  Laboratory workup is notable for a negative troponin, symptoms started 3 days prior I do feel this is reasonable.  Lipase and abdominal laboratory workup are all unremarkable as well.  She was treated with a GI cocktail with improvement in symptoms.  Patient significantly worried about her heart, advised we can do a repeat troponin, however I do feel otherwise that this is very unlikely to be ACS given improvement with GI cocktail lack of findings.  Repeat troponin shows no elevation.  Patient reassured.  Discharged home with return precautions.Pt remained hemodynamically stable during their stay in the emergency department."     Reports symptoms started last Saturday after she was cleaning her whole bathroom with bleach, scrubbing at tub  Since then has felt SOB, left upper back pain and increased congestion   Left upper back pain is the most bothersome  Feels this when she takes a deep breath in   Describes pain as a pressure and achy, shoulder feels tight, radiates up to neck   Pain is relieved by #2 500 mg tylenol  SOB has improved since being seen in ED - using albuterol about 4x a day, no nighttime awakenings   Allergies have been worsening over the past few weeks,  hx of seasonal allergies, feels congested in nose, runny eyes, has been using zyrtec daily   Feels more fatigued   Hx of GERD/barretts - has been taking #2 40 mg omeprazole daily instead of 1, this is helping with acid reflux symptoms - increasing hoarseness in voice and epigastric pain, due to repeat EGD - was scheduled for today but rescheduled due to SOB.   Non-smoker  No fevers, chills, sick contacts, chest pain, dyspnea on exertion, cough      Additional ROS:  All others reviewed and negative.    Past Medical History:  06/12/2004: ACUTE GASTRITIS W/O HEMORRHAGE      Comment: H. Pylori positive (done at [facility]); GI                consult Dr. Phillis Knack, TOC 10/01/04  03/26/2004: ALLERGY, UNSPECIFIED      Comment: Seen by ENT, deviated septum, hypertrophy of                inferior nasal turbinates  No date: Anxiety  No date: Asthma  07/26/2004: CLOSTRIDIUM DIFFICILE      Comment: GI cosult: Dr. Rollene Rotunda, TOC done on 10/01/04  No date: Elevated cholesterol      Comment: resolved without meds  No date: Esophageal reflux  No date: Snores  No date: Stomach disease  No date: Unspecified asthma(493.90)  No date: Wears eyeglasses      Comment: night driving  Past Surgical History:  No date: CHOLECYSTECTOMY  No date: NO SIGNIFICANT SURGICAL HISTORY  No date: OB ANTEPARTUM CARE CESAREAN DLVR & POSTPARTUM    Family History    OTHER Father     Comment: leukemia     Diabetes Mother     Hypertension Mother     Lipids Mother     OTHER Mother     Comment: ESRD on HD    Hypertension Father     Hypertension Brother        Social History  Social History   Marital status: Married  Spouse name: N/A    Years of education: N/A  Number of children: N/A     Occupational History  None on file     Social History Main Topics   Smoking status: Never Smoker    Smokeless tobacco: Never Used    Alcohol use No    Drug use: No    Sexual activity: Yes    Partners: Male    Comment: men age 4 q 4-6 wks x 3d, small fibroid on u/s, 1st SA age 3, 2  lifetime partners, current x 8 yrs     Other Topics Concern   None on file     Social History Narrative    07/09/2005    To [name] with family at age 51, husband from [place]    Works [place]    Pt thrilled with pregnancy-seeking pregnancy x 4 yrs, very anxious since sab 2 yrs ago    Has one child    Not working now    Lives with husband and daughter    Alton Revere, MD, 05/13/2016, 1:50 PM         Immunization History   Administered Date(s) Administered    HEP B ADULT 3 DOSE 20 and > 04/05/2004, 05/03/2004, 10/07/2004    INFLUENZA VIRUS VAC QUAD LIVE INTRANASAL 2-<69YRS 12/27/2008, 01/02/2010, 11/25/2012    Influenza Virus Tri Presv Free 3/> YRS IM 01/20/2006    Pneumococcal,NOS 01/02/2010    Td 04/03/2004, 02/21/2009    Tdap (adacel) 08/05/2012       Current Outpatient Prescriptions:  albuterol (PROVENTIL HFA,VENTOLIN HFA, PROAIR HFA) 108 (90 Base) MCG/ACT inhaler Inhale 2 puffs into the lungs every 4 (four) hours as needed for Wheezing or Shortness of breath Disp: 1 Inhaler Rfl: 1   omeprazole (PRILOSEC) 40 MG capsule Take 1 capsule by mouth daily Disp: 30 capsule Rfl: 0   loratadine (CLARITIN) 10 MG tablet Take 1 tablet by mouth daily Disp: 30 tablet Rfl: 5     No current facility-administered medications for this visit.     Physical exam:  BP 112/85 (Site: LA, Position: Sitting, Cuff Size: Lrg)  Pulse 86  Temp 97.2 F (36.2 C) (Temporal)  Wt 123.8 kg (273 lb)  LMP 06/13/2016 (Exact Date)  SpO2 97%  BMI 48.36 kg/m2    Constitutional: Not in acute distress.  HEENT: Normal conjunctiva and intact EOM. PERRLA. Boggy nasal turbinates. Oropharynx is clear and moist. No oropharyngeal exudate. Intact TM.  Neck: Supple. No lymphadenopathy.    Cardiovascular: Regular rate, rhythm, normal S1/ S2 heart sounds. No gallop, friction rub, or murmur. No edema.    Pulmonary/Chest: Effort normal and breath sounds normal. No respiratory distress. No tenderness. No wheezes,  rales.  Abdominal: Soft. Obese. Bowel sounds are normal. No distension or mass. There is no rebound or guarding. No tenderness.  Musculoskeletal: Full range of motion UE bilaterally. Mildly  tender to palpation over left trapezius muscle, no redness, warmth, deformity or swelling.   Neurological:  Alert and oriented. 5/5 strength bilaterally in UE, sensation intact.    Psychiatric: Normal mood and affect. Thought content appears to be intact.   Skin: Skin is dry and intact. No rash, erythema or pallor noted.      Assessment and Plan:  (R06.02) Shortness of breath  (primary encounter diagnosis)  (J30.1) Chronic seasonal allergic rhinitis due to pollen  (T78.40XD) Allergic state, subsequent encounter  (J45.20) Mild intermittent asthma, unspecified whether complicated  Comment: Likely related to seasonal allergies and recent chemical exposure (bleach) which triggered flare of allergies and mild asthma. Lungs clear on exam today. Reports SOB has been improving. CXR in ED 06/14/16 unremarkable. ACS r/o troponins, EKG unremarkable and no risk factors for PE. Unlikely that left upper back pain is related to SOB given exam, tender to palpation in left trapezius. Patient reports recent increase congestion, runny eyes and nasal turbinates are boggy on exam. Patient has been taking zyrtec daily, has tried flonase in the past but had a bad headache while taking. Will try switching zyrtec to claritin for better allergy relief. No PFTs on file.  Plan: loratadine (CLARITIN) 10 MG tablet, albuterol (PROVENTIL HFA,VENTOLIN HFA, PROAIR         HFA) 108 (90 Base) MCG/ACT inhaler  Continue albuterol PRN for SOB  RTC in 1 week for PFTs nurse visit to assess for asthma.   Patient was educated/ counseled on warning signs and to seek immediate care in those scenarios by calling 911 or going to nearest Emergency Room    (M54.6) Acute left-sided thoracic back pain  Comment: L. Trapezius muscle tender to palpation, reports increase use of arms  and shoulder when cleaning bathroom last Saturday which triggered symptoms. Upper back pain worse with deep breaths in and relieved with tylenol. Likely musculoskeletal and not related to SOB.    Plan: Conservative management with heat/ice, rest, tylenol - advised patient to not exceed 4000 mg tylenol a day, take only as needed for pain.      (K21.9) Gastroesophageal reflux disease, esophagitis presence not specified  (K22.70) Barrett's esophagus without dysplasia  Comment: Patient reports she has been experiencing worsening acid reflux symptoms. Hx of barretts esophagitis with last EGD 06/17/14, scheduled for surveillance EGD today but rescheduled due to SOB. Since ED visit patient increased from #1 pill omeprazole to #2 which has helped with symptoms. Requests refill of omeprazole today, script originally filled by GI team. Worsening GERD likely also contributing to symptoms. Will refill 1 month supply.   Plan: omeprazole (PRILOSEC) 40 MG capsule  EGD rescheduled for 07/02/16, asked patient to discuss further omeprazole refills and omeprazole dosing with GI team at upcoming EGD.       We discussed the patients current medications. The patient expressed understanding and no barriers to adherence were identified.   1. The patient indicates understanding of these issues and agrees with the plan. Brief care plan is updated and reviewed with the patient.   2. The patient is given an After Visit Summary sheet that lists all medications with directions, allergies, orders placed during this encounter, and follow-up instructions.   3. I reviewed the patient's medical information and medical history.   4. I reconciled the patient's medication list and prepared and supplied needed refills.   5. I have reviewed the past medical, family, and social history sections including the medications and allergies    Verlene Mayer, PA-C  [facility]

## 2016-06-17 NOTE — Telephone Encounter (Signed)
Called and spoke with patient after receiving warm hand off page, she reports she has been having issues since last week, was cleaning with products and developed sob and some tightness in her back. Went to ED for this on Saturday, CXR and labs done, pneumonia and cardiac ruled out. Reports her allergies have also been acting up and feels some of this is related.  Continues with tightness across her entire upper back, and some sob at times, feels hard to take a deep breath, no chest pain, no wheezing or cough. Denies any worsening of sxs since Saturday.  Has been taking tylenol extra strength which does help the back discomfort, also reports she is using her albuterol inhaler about 3 times per day with effect. Has appt scheduled tomorrow afternoon, offered to look for appt today-declined, unable to come in today, asks if tomorrow's appt can be earlier, rescheduled to am with Alejandra Martin. Patient no longer has Alejandra Martin insurance has [place] together with [place].    Advised if sxs worsen at all or if new sxs develop to return to ED, she agreed.

## 2016-06-17 NOTE — Telephone Encounter (Signed)
Regarding: shortness of breath   ----- Message from Stevenson Clinch sent at 06/17/2016 10:52 AM EDT -----  Alejandra Martin [id number], 46 year old, female    Calls today:  Sick    What are the symptoms Shortness's of breath and back pain apt scheduled for 5/2  How long has patient been sick? 4 days   What has pt. tried at home tylenol   Person calling on behalf of patient: Patient (self)    Patient's language of care: English    Patient does not need an interpreter.    Patient's PCP: Janann August, MD

## 2016-06-18 ENCOUNTER — Ambulatory Visit (HOSPITAL_BASED_OUTPATIENT_CLINIC_OR_DEPARTMENT_OTHER): Payer: PRIVATE HEALTH INSURANCE | Admitting: Physician Assistant

## 2016-06-18 ENCOUNTER — Encounter (HOSPITAL_BASED_OUTPATIENT_CLINIC_OR_DEPARTMENT_OTHER): Payer: Self-pay | Admitting: Physician Assistant

## 2016-06-18 VITALS — BP 112/85 | HR 86 | Temp 97.2°F | Wt 273.0 lb

## 2016-06-18 DIAGNOSIS — J301 Allergic rhinitis due to pollen: Secondary | ICD-10-CM

## 2016-06-18 DIAGNOSIS — R0602 Shortness of breath: Secondary | ICD-10-CM

## 2016-06-18 DIAGNOSIS — T7840XD Allergy, unspecified, subsequent encounter: Secondary | ICD-10-CM

## 2016-06-18 DIAGNOSIS — K219 Gastro-esophageal reflux disease without esophagitis: Secondary | ICD-10-CM

## 2016-06-18 DIAGNOSIS — K2270 Barrett's esophagus without dysplasia: Secondary | ICD-10-CM

## 2016-06-18 DIAGNOSIS — J4520 Mild intermittent asthma, uncomplicated: Secondary | ICD-10-CM

## 2016-06-18 DIAGNOSIS — M546 Pain in thoracic spine: Secondary | ICD-10-CM

## 2016-06-18 MED ORDER — LORATADINE 10 MG PO TABS
10.0000 mg | ORAL_TABLET | Freq: Every day | ORAL | 5 refills | Status: DC
Start: 2016-06-18 — End: 2016-07-16

## 2016-06-18 MED ORDER — OMEPRAZOLE 40 MG PO CPDR
40.0000 mg | DELAYED_RELEASE_CAPSULE | Freq: Every day | ORAL | 0 refills | Status: DC
Start: 2016-06-18 — End: 2017-03-17

## 2016-06-18 MED ORDER — ALBUTEROL SULFATE HFA 108 (90 BASE) MCG/ACT IN AERS
2.0000 | INHALATION_SPRAY | RESPIRATORY_TRACT | 1 refills | Status: DC | PRN
Start: 2016-06-18 — End: 2017-06-29

## 2016-06-18 MED ORDER — ALBUTEROL SULFATE HFA 108 (90 BASE) MCG/ACT IN AERS: 2 | Inhaler | RESPIRATORY_TRACT | 1 refills | 0 days | Status: DC | PRN

## 2016-06-18 MED ORDER — LORATADINE 10 MG PO TABS: 10 mg | tablet | Freq: Every day | ORAL | 5 refills | 0 days | Status: DC

## 2016-06-18 MED ORDER — OMEPRAZOLE 40 MG PO CPDR: 40 mg | capsule | Freq: Every day | ORAL | 0 refills | 0 days | Status: DC

## 2016-06-19 LAB — EKG

## 2016-07-02 ENCOUNTER — Ambulatory Visit (HOSPITAL_BASED_OUTPATIENT_CLINIC_OR_DEPARTMENT_OTHER)
Admit: 2016-07-02 | Disposition: A | Discharge status: Home / Self Care | Payer: Self-pay | Source: Ambulatory Visit | Attending: Gastroenterology | Admitting: Gastroenterology

## 2016-07-02 ENCOUNTER — Encounter (HOSPITAL_BASED_OUTPATIENT_CLINIC_OR_DEPARTMENT_OTHER): Payer: Self-pay | Admitting: Gastroenterology

## 2016-07-02 LAB — URINE PREGNANCY TEST (POINT OF CARE): HCG QUALITATIVE URINE: NEGATIVE

## 2016-07-02 MED ORDER — LACTATED RINGERS IV SOLN
INTRAVENOUS | Status: DC
Start: 2016-07-02 — End: 2016-07-02
  Administered 2016-07-02: 10:00:00 via INTRAVENOUS

## 2016-07-02 MED ORDER — SIMETHICONE 40 MG/0.6ML PO SUSP
ORAL | Status: AC
Start: 2016-07-02 — End: 2016-07-02
  Administered 2016-07-02: 11:00:00
  Filled 2016-07-02: qty 0.6

## 2016-07-02 NOTE — PROVATION-GI (Signed)
[facility]  Patient Name: Alejandra Martin  MRN: [id number]  Account Number: [id number]  Gender: Female  Age: 46  Date of Birth: Jul 05, 1970  Admit Type: Outpatient  Patient Location: [facility]  Note Status: Finalized  Multiple Proc?: No  Referring MD:         Janann August, MD  Procedure Date:       07/02/2016 10:29:12 AM  Procedure:            Upper GI endoscopy  Endoscopist:          Lucio Edward, MD  Indications for Procedure:       Heartburn, Gastro-esophageal reflux disease, Follow-up of Barrett's esophagus  Medications:          Monitored Anesthesia Care  Procedure:       Pre-Anesthesia Assessment:       - Prior to the procedure, a History and Physical was performed, and patient        medications and allergies were reviewed. The patient is competent. The risks        and benefits of the procedure and the sedation options and risks were        discussed with the patient. All questions were answered and informed consent        was obtained. Patient identification and proposed procedure were verified by        the physician, the nurse and the anesthesiologist in the procedure room.        Mental Status Examination: alert and oriented. Airway Examination: normal        oropharyngeal airway and neck mobility and Mallampati Class II (the uvula but        not tonsillar pillars visualized). Respiratory Examination: clear to        auscultation. CV Examination: normal. Prophylactic Antibiotics: The patient        does not require prophylactic antibiotics. Prior Anticoagulants: The patient        has taken no previous anticoagulant or antiplatelet agents. ASA Grade        Assessment: II - A patient with mild systemic disease. After reviewing the        risks and benefits, the patient was deemed in satisfactory condition to        undergo the procedure. The anesthesia plan was to use monitored anesthesia        care (MAC). Immediately prior to administration of medications, the patient        was re-assessed for  adequacy to receive sedatives. The heart rate,        respiratory rate, oxygen saturations, blood pressure, adequacy of pulmonary        ventilation, and response to care were monitored throughout the procedure.        The physical status of the patient was re-assessed after the procedure.       Just prior to the procedure, an updated history and physical was done. I        obtained an informed consent from the patient reviewing the risk of the        procedure including (but not limited to) respiratory depression, perforation,        bleeding, discomfort, a possible need for surgery and unexpected reactions to        medications. The patient is aware that test has limitations and may not        detect significant lesions such as cancer or other potential diseases. The  patient was also informed that they might need a repeat upper endoscopy        earlier than standard guidelines if there are changes in their symptoms or        concerning findings noted. A time out was performed with the entire procedure        staff present. The scope was passed under direct vision. Throughout the        procedure, the patient's blood pressure, pulse, and oxygen saturations were        monitored continuously. The [id number] was introduced through the        mouth, and advanced to the third part of duodenum. The upper GI endoscopy was        accomplished without difficulty. The patient tolerated the procedure well.        The total duration of the procedure was 8 minutes.  Findings:       The oropharynx was normal.       LA Grade A (one or more mucosal breaks less than 5 mm, not extending between        tops of 2 mucosal folds) esophagitis with no bleeding was found 41 to 42 cm        from the incisors. This was biopsied with a cold forceps for histology and        evaluation to rule out Barrett's Esophagus. Estimated blood loss was minimal.       No gross lesions were noted in the gastric body and in the gastric antrum.         This was biopsied with a cold forceps for histology and Helicobacter pylori        testing. Estimated blood loss was minimal.       No gross lesions were noted in the third portion of the duodenum. This was        biopsied with a cold forceps for histology and evaluation of celiac sprue.        Estimated blood loss was minimal.       The cardia and gastric fundus were normal on retroflexion.       The exam was otherwise without abnormality.  Post Procedure Diagnosis:       - Normal oropharynx.       - LA Grade A reflux esophagitis. Rule out Barrett's esophagus. Biopsied.       - No gross lesions in the stomach. Biopsied.       - No gross lesions in the third portion of the duodenum. Biopsied.  Complications:        No immediate complications. Estimated blood loss: Minimal.  Recommendation:       - Discharge patient to home (ambulatory).       - Await pathology results.       - Repeat upper endoscopy in 3 years for surveillance.       - Continue present medications.       - No aspirin, ibuprofen, naproxen, or other non-steroidal anti-inflammatory        drugs for 3 days after biopsy.  Lucio Edward, MD  07/02/2016 10:56:20 AM  This report has been signed electronically.  Number of Addenda: 0  Note Initiated On: 07/02/2016 10:29 AM

## 2016-07-02 NOTE — H&P (Signed)
Pre-Anesthetic Note    Pre op Diagnosis: barrett's    Planned Procedure: EGD    Patient ID:  Alejandra Martin isa 46 year old female  Height: 5\' 3"  (160 cm)  Weight: 123.8 kg (273 lb)        Previous Anesthetic History:   Past Surgical History:  No date: CHOLECYSTECTOMY  No date: NO SIGNIFICANT SURGICAL HISTORY  No date: OB ANTEPARTUM CARE CESAREAN DLVR & POSTPARTUM  Patient denies complicationsof anesthesia.  Patient denies family complications of anesthesia.    Current Medications:      Current Outpatient Prescriptions:  albuterol (PROVENTIL HFA,VENTOLIN HFA, PROAIR HFA) 108 (90 Base) MCG/ACT inhaler Inhale 2 puffs into the lungs every 4 (four) hours as needed for Wheezing or Shortness of breath Disp: 1 Inhaler Rfl: 1   omeprazole (PRILOSEC) 40 MG capsule Take 1 capsule by mouth daily Disp: 30 capsule Rfl: 0   loratadine (CLARITIN) 10 MG tablet Take 1 tablet by mouth daily Disp: 30 tablet Rfl: 5     Current Facility-Administered Medications:  lactated ringers infusion  Intravenous Continuous Lucio Edward, MD Last Rate: 30 mL/hr at 07/02/16 1009   simethicone (MYLICON) 40 EX/9.3ZJ drops            Hospital Meds   simethicone         PRN Meds  Current Facility-Administered Medications   Medication Dose Route Frequency Last Dose           Allergies:   Review of Patient's Allergies indicates:   Erythromycin            Itching   Erythromycin            Itching   Macrolides and keto*    Itching, Rash    Smoking, Alcohol, Drugs:     Smoking status: Never Smoker    Smokeless tobacco: Never Used    Alcohol use No       Drug use: No       PMHx:  Past Medical History:  06/12/2004: ACUTE GASTRITIS W/O HEMORRHAGE      Comment: H. Pylori positive (done at [facility]); GI                consult Dr. Phillis Knack, TOC 10/01/04  03/26/2004: ALLERGY, UNSPECIFIED      Comment: Seen by ENT, deviated septum, hypertrophy of                inferior nasal turbinates  No date: Anxiety  No date: Asthma  07/26/2004: CLOSTRIDIUM DIFFICILE       Comment: GI cosult: Dr. Rollene Rotunda, TOC done on 10/01/04  No date: Elevated cholesterol      Comment: resolved without meds  No date: Esophageal reflux  No date: Snores  No date: Stomach disease  No date: Unspecified asthma(493.90)  No date: Wears eyeglasses      Comment: night driving    PHYSICAL EXAMINATION:  BP 129/68  Pulse 75  Temp 98 F (36.7 C) (Temporal)  Resp 17  Ht 5\' 3"  (1.6 m)  Wt 123.8 kg (273 lb)  LMP 06/13/2016 (Exact Date)  SpO2 97%  BMI 48.36 kg/m2    General: alert    Airway Classification:  Mallampati: Class I     A soft palate, fauces, uvula, anterior and posterior tonsil pillars are seen.   Mallampati Airway Classification:   Dental Exam: nml  Oral Opening:  3cm  Full range of neck Motion:  Yes     Lungs: clear to  auscultation bilaterally    Heart: normal and regular rate and rhythm    EKG/Echo:     Chest X-Ray:     Pertinent Labs:     Lab Results  Component Value Date   NA 141 06/14/2016   K 3.8 06/14/2016   CREAT 1.1 06/14/2016   GLUCOSER 114 06/14/2016   WBC 10.7 06/14/2016   HCT 38.7 06/14/2016   PLTA 275 06/14/2016   PT 10.0 04/28/2008   APTT 27.5 04/28/2008   INR 1.0 (L) 04/28/2008        Other Findings: None    ASA Assessment: II   A Patient With Mild Systemic Disease  Emergency:  No     Plan:  GA NPO    Post-op Plan: PACU    Interpreter used: No       Pager:           Location manager.  For non-mandatory fields, the provider will enter relevant positive findings.

## 2016-07-02 NOTE — Discharge Instructions (Signed)
GI CENTER DISCHARGE INSTRUCTIONS     When you return home, you may feel sleepy. Get plenty of rest for the remainder of the day.     If you received sedation for your procedure DO NOT DRIVE, OPERATE MACHINERY OR MAKE IMPORTANT DECISIONS for the reminder of the day.     It is normal after having an UPPER ENDOSCOPY to develop a mild sore throat that will last a few days.     If you had a biopsy or Polypectomy you will need o hold aspirin or medications containing aspirin for 5 days     If you had a biopsy or Polypectomy you will need to hold any medication such as Advil, Motrin, Naproxin, Ibuprofen etc. For 5 days.      Call your doctor immediately if you develop:   -  Chest Pain   -  Shortness of breath   -  Difficulty swallowing   -  Vomit bright red blood   -  Notice your bowel movements are black or maroon colored   -  You feel weak and tired     Call your physician for any unusual symptoms.     If for any reason you are unable to reach your doctor go to the nearest         [place].   Post Procedure Diagnosis:       - Normal oropharynx.       - LA Grade A reflux esophagitis. Rule out Barrett's esophagus. Biopsied.       - No gross lesions in the stomach. Biopsied.       - No gross lesions in the third portion of the duodenum. Biopsied.  Complications:        No immediate complications. Estimated blood loss: Minimal.  Recommendation:       - Discharge patient to home (ambulatory).       - Await pathology results.       - Repeat upper endoscopy in 3 years for surveillance.       - Continue present medications.       - No aspirin, ibuprofen, naproxen, or other non-steroidal anti-inflammatory        drugs for 3 days after biopsy.  Lucio Edward, MD  07/02/2016 10:56:20 AM  This report has been signed electronically.  Number of Addenda: 0  Note Initiated On: 07/02/2016  SPECIFIC INSTRUCTIONS:

## 2016-07-02 NOTE — H&P (Addendum)
GI Pre-procedure History and Physical Short Form  Alejandra Martin is an 46 year old female.    Chief Complaint: She presents for Upper GI endoscopy    HPI:   This 46 year old English speaking patient presents for follow up of her Barrett's esophagitis.     She complains of dyspepsia. She has known Barrett's and is due for a follow up. She was seen in [name] ED on 06/14/16 with asthma and atypical chest pain. It was felt reflux might be playing a role.     The patient denied dysphagia, odynophagia, early satiety, weight loss, nausea, vomiting, hematemesis, hematochezia, melena, diarrhea, bowel urgency, constipation, fever, or jaundice.         Active Problems:  Patient Active Problem List:     Allergy, unspecified not elsewhere classified     Acute gastritis without mention of hemorrhage     Intestinal infection due to Clostridium difficile     Pure hypercholesterolemia     Major depressive disorder, single episode, moderate (HCC)     Generalized anxiety disorder     Benign positional vertigo     Contraceptive management     Tubular adenoma of colon     GERD (gastroesophageal reflux disease)     Diarrhea     Cholecystitis     Epigastric abdominal pain     Mass of ampulla of Vater     Gastroesophageal reflux disease with esophagitis     Obesity     Colon polyp     Mucosal abnormality of duodenum     Abnormal LFTs     Allergic rhinitis due to pollen     Anisometropic amblyopia of right eye     Barrett's esophagus     Disorder of biliary tract     Gastroesophageal reflux disease     Mild intermittent asthma without complication     Prediabetes     Tinea pedis of both feet      Past Medical History:   06/12/2004: ACUTE GASTRITIS W/O HEMORRHAGE      Comment: H. Pylori positive (done at [facility]); GI                consult Dr. Phillis Knack, TOC 10/01/04  03/26/2004: ALLERGY, UNSPECIFIED      Comment: Seen by ENT, deviated septum, hypertrophy of                inferior nasal turbinates  No date: Anxiety  No date: Asthma  07/26/2004:  CLOSTRIDIUM DIFFICILE      Comment: GI cosult: Dr. Rollene Rotunda, TOC done on 10/01/04  No date: Elevated cholesterol      Comment: resolved without meds  No date: Esophageal reflux  No date: Snores  No date: Stomach disease  No date: Unspecified asthma(493.90)  No date: Wears eyeglasses      Comment: night driving    Past Surgical History:  No date: CHOLECYSTECTOMY  No date: NO SIGNIFICANT SURGICAL HISTORY  No date: OB ANTEPARTUM CARE CESAREAN DLVR & POSTPARTUM      Social History:   Marital status: Married  Spouse name: N/A    Years of education: N/A  Number of children: N/A     Occupational History  None on file     Social History Main Topics   Smoking status: Never Smoker    Smokeless tobacco: Never Used    Alcohol use No    Drug use: No    Sexual activity: Yes    Partners: Male  Comment: men age 53 q 4-6 wks x 3d, small fibroid on u/s, 1st SA age 74, 2 lifetime partners, current x 8 yrs     Other Topics Concern   None on file     Social History Narrative    07/09/2005    [name] family [place] age 36, husband from [place]    Works FT - [employer]    Pt thrilled with pregnancy-seeking pregnancy x 4 yrs, very anxious since sab 2 yrs ago    Has one child    Not working now    Lives with husband and daughter    Alton Revere, MD, 05/13/2016, 1:50 PM           Family History:    Family History    OTHER Father     Comment: leukemia     Diabetes Mother     Hypertension Mother     Lipids Mother     OTHER Mother     Comment: ESRD on HD    Hypertension Father     Hypertension Brother        Allergies:    Erythromycin            Itching   Erythromycin            Itching   Macrolides and keto*    Itching, Rash      Medications:     No current outpatient prescriptions on file prior to encounter.  No current facility-administered medications on file prior to encounter.       Review of Systems:  Cardiovascular:  No chest pain, palpitations, MI, heart failure,  Pulmonary:  No TB or  asthma.  She had pneumonia.    Neuro:  No stroke, seizure or loss of consciousness.     Endocrine:  No diabetes or thyroid disease      Physical Exam:  Vital Signs: BP 129/68  Pulse 75  Temp 98 F (36.7 C) (Temporal)  Resp 17  Ht _0  (1.6 m)  Wt 123.8 kg (273 lb)  LMP 06/13/2016 (Exact Date)  SpO2 97%  BMI 48.36 kg/m2  ENT: No scleral icterus or adenopathy  Airway Evaluation:  Gag reflex intact: Yes  Ability to open mouth wide:   Full  Dentures:  No  Loose teeth:  No  Neck range of motion  Full  Mallampati Airway Classification: Class II   The same as Class I except the tonsilar pillars are hidden by the tounge.  CHEST: Clear to auscultation and percussion.   CARDIOVASC: Normal rate and rhythym, no murmurs, rubs or gallops  ABDOMEN: Normal bowel sounds. No tenderness, mass or enlargement of spleen or liver. No surgical scars.  EXTREMITIES : Without clubbing, cyanosis or edema.   NEUROLOGIC: Alert and oriented x3, no focal findings.  DERM: No jaundice, hives or rashes.    Results for Alejandra Martin (MRN [id number]) as of 07/02/2016 10:37   Ref. Range 06/14/2016 15:26   SODIUM Latest Ref Range: 136 - 145 mmol/L 141   POTASSIUM Latest Ref Range: 3.5 - 5.1 mmol/L 3.8   CHLORIDE Latest Ref Range: 98 - 107 mmol/L 104   CARBON DIOXIDE Latest Ref Range: 21 - 32 mmol/L 27   ANION GAP Latest Ref Range: 5 - 15 mmol/L 10   CALCIUM Latest Ref Range: 8.5 - 10.1 mg/dL 8.9   Glucose Random Latest Ref Range: 74 - 160 mg/dL 114   BUN (UREA  NITROGEN) Latest Ref Range: 7 - 18 mg/dL 15   CREATININE Latest Ref Range: 0.4 - 1.2 mg/dL 1.1   ESTIMATED GLOMERULAR FILT RATE Latest Ref Range: >60 ML/MIN 53 (L)   TOTAL PROTEIN Latest Ref Range: 6.4 - 8.2 g/dL 7.3   ALBUMIN Latest Ref Range: 3.4 - 5.0 g/dL 3.7   BILIRUBIN TOTAL Latest Ref Range: 0.2 - 1.0 mg/dL 0.2   ALKALINE PHOSPHATASE Latest Ref Range: 45 - 117 U/L 103   ASPARTATE AMINOTRANSFERASE Latest Ref Range: 8 - 34 U/L 16   ALANINE AMINOTRANSFERASE Latest Ref Range: 12 - 45 U/L 24    TROPONIN I Latest Ref Range: 0.00 - 0.04 ng/mL < 0.02   LIPASE Latest Ref Range: 73 - 393 U/L 113   WHITE BLOOD CELL COUNT Latest Ref Range: 4.0 - 11.0 TH/uL 10.7   RED BLOOD CELL COUNT Latest Ref Range: 3.90 - 5.20 M/uL 4.41   HEMOGLOBIN Latest Ref Range: 11.2 - 15.7 g/dL 12.4   HEMATOCRIT Latest Ref Range: 34.1 - 44.9 % 38.7   MEAN CORPUSCULAR VOL Latest Ref Range: 80.0 - 100.0 fL 87.8   MEAN CORPUSCULAR HGB Latest Ref Range: 26.0 - 34.0 pg 28.1   MEAN CORP HGB CONC Latest Ref Range: 31.0 - 37.0 g/dL 32.0   RBC DISTRIBUTION WIDTH STD DEV Latest Ref Range: 35.1 - 46.3 fL 42.7   RBC DISTRIBUTION WIDTH Latest Ref Range: 11.5 - 14.3 % 13.5   PLATELET COUNT Latest Ref Range: 150 - 400 TH/uL 275   MEAN PLATELET VOLUME Latest Ref Range: 8.7 - 12.5 fL 9.7   NEUTROPHIL % Latest Ref Range: 40.0 - 75.0 % 64.9   IMMATURE GRANULOCYTE % Latest Ref Range: 0.0 - 0.4 % 0.2   LYMPHOCYTE % Latest Ref Range: 15.0 - 54.0 % 29.3   MONOCYTE % Latest Ref Range: 4.0 - 13.0 % 4.8   EOSINOPHIL % Latest Ref Range: 0.0 - 7.0 % 0.5   BASOPHIL % Latest Ref Range: 0.0 - 1.2 % 0.3   ABSOLUTE NEUTROPHIL COUNT Latest Ref Range: 1.6 - 8.3 TH/uL 6.9   ABSOLUTE IMM GRAN COUNT Latest Ref Range: 0.00 - 0.03 TH/uL 0.02   ABSOLUTE LYMPH COUNT Latest Ref Range: 0.6 - 5.9 TH/uL 3.1   ABSOLUTE MONO COUNT Latest Ref Range: 0.2 - 1.4 TH/uL 0.5   ABSOLUTE EOSINOPHIL COUNT Latest Ref Range: 0.0 - 0.8 TH/uL 0.1   ABSOLUTE BASO COUNT Latest Ref Range: 0.0 - 0.1 TH/uL 0.0       ASA Classification: ASA Class II (a patient with mild systemic disease)    Sedation: MAC    Impression:  Barrett's esophagus with dysplasia  Gastroesophageal reflux disease, esophagitis presence not specified     No contraindication to proceeding with the indicated Upper GI endoscopy    Medical Decision Making/Plan:  The patient has Barrett's and we will do her EGD at this time. The patient was informed of the risk of bleeding, discomfort, perforation, a need for surgery, infection, drug  reaction, cardiovascular and cerebrovascular compromise and the possibility of missing a lesion or problem.  She understood these risks and consented to the exam.  She may have sleep apnea. Will do with MAC.       Proceed with Upper GI endoscopy

## 2016-07-02 NOTE — Progress Notes (Signed)
Anesthesia Post-Operative Evaluation Note    Patient: Alejandra Martin    Procedure: egd          POST-OPERATIVE EVALUATION    Anesthesia Post Evaluation    Vitals signs in patient's normal range: Yes  Respiratory function stable; airway patent: Yes  Cardiovascular function stable: Yes  Hydration status stable: Yes  Mental status recovered; patient participates in evaluation and/or is at baseline: Yes  Pain control satisfactory: Yes  Nausea and vomiting control satisfactory: Yes

## 2016-07-11 LAB — SURGICAL PATH SPECIMEN

## 2016-07-14 ENCOUNTER — Encounter (HOSPITAL_BASED_OUTPATIENT_CLINIC_OR_DEPARTMENT_OTHER): Payer: Self-pay | Admitting: Gastroenterology

## 2016-07-16 ENCOUNTER — Telehealth (HOSPITAL_BASED_OUTPATIENT_CLINIC_OR_DEPARTMENT_OTHER): Payer: Self-pay | Admitting: Registered Nurse

## 2016-07-16 DIAGNOSIS — T7840XD Allergy, unspecified, subsequent encounter: Secondary | ICD-10-CM

## 2016-07-16 DIAGNOSIS — J301 Allergic rhinitis due to pollen: Secondary | ICD-10-CM

## 2016-07-16 MED ORDER — LORATADINE 10 MG PO TABS
10.0000 mg | ORAL_TABLET | Freq: Every day | ORAL | 5 refills | Status: AC
Start: 2016-07-16 — End: 2017-01-12

## 2016-07-16 MED ORDER — LORATADINE 10 MG PO TABS: 10 mg | tablet | Freq: Every day | ORAL | 5 refills | 0 days | Status: AC

## 2016-07-16 NOTE — Telephone Encounter (Signed)
-----   Message from Mickle Asper sent at 07/16/2016  1:25 PM EDT -----  Regarding: Prescription request   Contact: [phone]  Honestie Kulik [phone], 46 year old, female    Calls today:  Medication Questions  Patient Calling    What medication do you have questions about loratadine (CLARITIN) 10 MG tablet   What is your question: Patient was prescribed this medication - she was to take one tablet. But patient did not see any improvement so she doubled her dosage which seems to help. Patient only has one tablet left - and is not due for refill until mid next month. Patient would like to know if she can obtain new prescription. - Best time to call is after 3:30  Return phone number [phone]  Person calling on behalf of patient: Patient (self)    Patient's language of care: English    Patient does not need an interpreter.    Patient's PCP: Janann August, MD

## 2016-07-16 NOTE — Progress Notes (Signed)
Pls see message from pt re: loratadine.   Has been taking more than prescribed for symptoms. Now out of meds.   Requesting refill.   Lannie Fields, RN, 07/16/2016, 3:35 PM

## 2016-07-16 NOTE — Progress Notes (Signed)
Will refill loratadine today. However patient should not be taking a double dose of loratadine each day. She should only take 1 pill each day. Allergy season is wrapping up so allergies should start to improve. If her allergies are still uncontrolled on the normal dose of loratadine then patient should be scheduled to be seen.  Will not refill loratadine early again. Script sent to preferred pharmacy.     Alejandra Mayer, PA-C, 07/16/2016, 4:02 PM

## 2016-07-18 NOTE — Progress Notes (Signed)
Left VM for pt to return call.   Lannie Fields, RN, 07/18/2016, 9:53 AM

## 2016-08-01 ENCOUNTER — Ambulatory Visit (HOSPITAL_BASED_OUTPATIENT_CLINIC_OR_DEPARTMENT_OTHER): Payer: PRIVATE HEALTH INSURANCE | Admitting: Obstetrics & Gynecology

## 2016-08-06 ENCOUNTER — Ambulatory Visit (HOSPITAL_BASED_OUTPATIENT_CLINIC_OR_DEPARTMENT_OTHER): Payer: PRIVATE HEALTH INSURANCE | Admitting: Internal Medicine

## 2016-08-18 ENCOUNTER — Ambulatory Visit (HOSPITAL_BASED_OUTPATIENT_CLINIC_OR_DEPARTMENT_OTHER): Payer: PRIVATE HEALTH INSURANCE | Admitting: Obstetrics & Gynecology

## 2016-08-18 VITALS — BP 132/84 | HR 102 | Wt 272.0 lb

## 2016-08-18 DIAGNOSIS — Z124 Encounter for screening for malignant neoplasm of cervix: Secondary | ICD-10-CM

## 2016-08-18 DIAGNOSIS — N951 Menopausal and female climacteric states: Secondary | ICD-10-CM | POA: Insufficient documentation

## 2016-08-18 DIAGNOSIS — N915 Oligomenorrhea, unspecified: Secondary | ICD-10-CM

## 2016-08-18 LAB — FOLLICLE STIMULATING HORMONE: FOLLICLE STIMULATING HORMONE: 8 m[IU]/mL

## 2016-08-18 LAB — THYROID SCREEN TSH REFLEX FT4: THYROID SCREEN TSH REFLEX FT4: 2.41 u[IU]/mL (ref 0.358–3.740)

## 2016-08-18 LAB — PROLACTIN: PROLACTIN: 124.9 ng/mL — ABNORMAL HIGH (ref 2.2–30.3)

## 2016-08-18 NOTE — Progress Notes (Signed)
CC: irregular menses    HPI: Alejandra Martin is a 46 year old female here due to missed menses and some abdominal cramping. Menses have never been regular. She reports that some months she has a period and it is lighter and other months she skips a period. She denies that she ever has a period more than 1x/month. The longest she has gone is 4 months without menses. Denies hot flashes. Occasional sleepiness.     Cramping is irregular. Sometimes it feels light, just like the beginning of her period. Once - 2x/week. Denies dysuria. No fevers/chills. No abnl discharge. Is married but worried about getting pregnant.     Patient's last menstrual period was 08/04/2016 (approximate).    PMHx: Reflux, colon precancerous polyps, anxiety, prediabetic, asthma/allergies  FamHx: unsure when mother went through menopause. Some of her siblings went through menopauses at 28 - 76  GynHx: no contraception, denies abnl pap (neg in 2014).   Meds: zyrtec, omeprazole, albuterol  SocHx: denies tob/drugs    BP 132/84  Pulse 102  Wt 123.4 kg (272 lb)  LMP 08/04/2016 (Approximate)  BMI 48.18 kg/m2  Gen: NAD, comfortable, obese  Abd: soft, nt  Pelvic exam: exam chaperoned by nurse, EGBUS within normal limits, normal cervix without lesions, polyps or tenderness, uterus normal size, shape, consistency, no mass or tenderness, adnexa normal in size without mass or tenderness.    Pap obtained  US reviewed: 05/2016: Uterus 7.2 x 4.1 x 4.8 cm, EMS 5 mm. Bilateral ovaries wnl. No FF.    A/ Pelvic cramping, irregular menses, cervical cancer screening. Suspect perimenopause.   Will check TSH, prolactin, FSH, estradiol  Pap today  Will call with results.   Counseled to call/rtc if experiences heavy and irregular VB for EMBx.     Carmelina Paddock, MD

## 2016-08-18 NOTE — Patient Instructions (Addendum)
Patient Education   Perimenopause  Perimenopause is the time when your body begins to move into the menopause (no menstrual period for 12 straight months). It is a natural process. Perimenopause can begin 2-8 years before the menopause and usually lasts for 1 year after the menopause. During this time, your ovaries may or may not produce an egg. The ovaries vary in their production of estrogen and progesterone hormones each month. This can cause irregular menstrual periods, difficulty getting pregnant, vaginal bleeding between periods, and uncomfortable symptoms.  What are the causes?  · Irregular production of the ovarian hormones, estrogen and progesterone, and not ovulating every month.  Other causes include:  · Tumor of the pituitary gland in the brain.  · Medical disease that affects the ovaries.  · Radiation treatment.  · Chemotherapy.  · Unknown causes.  · Heavy smoking and excessive alcohol intake can bring on perimenopause sooner.    What are the signs or symptoms?  · Hot flashes.  · Night sweats.  · Irregular menstrual periods.  · Decreased sex drive.  · Vaginal dryness.  · Headaches.  · Mood swings.  · Depression.  · Memory problems.  · Irritability.  · Tiredness.  · Weight gain.  · Trouble getting pregnant.  · The beginning of losing bone cells (osteoporosis).  · The beginning of hardening of the arteries (atherosclerosis).  How is this diagnosed?  Your health care provider will make a diagnosis by analyzing your age, menstrual history, and symptoms. He or she will do a physical exam and note any changes in your body, especially your female organs. Female hormone tests may or may not be helpful depending on the amount of female hormones you produce and when you produce them. However, other hormone tests may be helpful to rule out other problems.  How is this treated?  In some cases, no treatment is needed. The decision on whether treatment is necessary during the perimenopause should be made by you and  your health care provider based on how the symptoms are affecting you and your lifestyle. Various treatments are available, such as:  · Treating individual symptoms with a specific medicine for that symptom.  · Herbal medicines that can help specific symptoms.  · Counseling.  · Group therapy.    Follow these instructions at home:  · Keep track of your menstrual periods (when they occur, how heavy they are, how long between periods, and how long they last) as well as your symptoms and when they started.  · Only take over-the-counter or prescription medicines as directed by your health care provider.  · Sleep and rest.  · Exercise.  · Eat a diet that contains calcium (good for your bones) and soy (acts like the estrogen hormone).  · Do not smoke.  · Avoid alcoholic beverages.  · Take vitamin supplements as recommended by your health care provider. Taking vitamin E may help in certain cases.  · Take calcium and vitamin D supplements to help prevent bone loss.  · Group therapy is sometimes helpful.  · Acupuncture may help in some cases.  Contact a health care provider if:  · You have questions about any symptoms you are having.  · You need a referral to a specialist (gynecologist, psychiatrist, or psychologist).  Get help right away if:  · You have vaginal bleeding.  · Your period lasts longer than 8 days.  · Your periods are recurring sooner than 21 days.  · You have bleeding after intercourse.  ·   You have severe depression.  · You have pain when you urinate.  · You have severe headaches.  · You have vision problems.  This information is not intended to replace advice given to you by your health care provider. Make sure you discuss any questions you have with your health care provider.  Document Released: 03/13/2004 Document Revised: 07/12/2015 Document Reviewed: 09/02/2012  Elsevier Interactive Patient Education © 2017 [employer] Inc[employer]

## 2016-08-21 LAB — HUMAN PAPILLOMAVIRUS (HPV): HUMAN PAPILLOMAVIRUS: NEGATIVE

## 2016-08-21 LAB — ESTRADIOL: ESTRADIOL: 65.2 pg/mL

## 2016-08-25 ENCOUNTER — Telehealth (HOSPITAL_BASED_OUTPATIENT_CLINIC_OR_DEPARTMENT_OTHER): Payer: Self-pay | Admitting: Registered Nurse

## 2016-08-25 LAB — CYTOPATH, C/V, THIN LAYER

## 2016-08-25 NOTE — Progress Notes (Signed)
Received message from primary care  Message to provider    Message:  Shaaron, Golliday  DOB 08/17/1916    Endocervix/exocervix    Unsatisfactory for evaluation too few squamous celss

## 2016-08-26 ENCOUNTER — Telehealth (HOSPITAL_BASED_OUTPATIENT_CLINIC_OR_DEPARTMENT_OTHER): Payer: Self-pay | Admitting: Obstetrics & Gynecology

## 2016-08-26 DIAGNOSIS — N915 Oligomenorrhea, unspecified: Secondary | ICD-10-CM

## 2016-08-26 DIAGNOSIS — E221 Hyperprolactinemia: Secondary | ICD-10-CM | POA: Insufficient documentation

## 2016-08-26 NOTE — Progress Notes (Signed)
Called pt and informed her of results message form provider  Pt states she is concerned that MRI is recommended  Pt informed hormone os secreted by the brain, sometimes causes milky whit discharge from the breast and can suppress womens period  Pt informed MRI recommended and once complete can meet to discuss options  Pt would like provider to call and discuss  Pt informed other labs are normal    Pt informed will message provider to call pt= provider not in clinic at this time      Pt informed HPV negative- PAP insufficient and should be repeated in 4 months

## 2016-08-26 NOTE — Progress Notes (Signed)
Called pt back. Left VM asking her to call us back on her mobile number. Noted that I would not be able to call her back today at this point and would be happy to speak with her tomorrow. Attempted to call her home phone # which did not go through.     Carmelina Paddock, MD

## 2016-08-26 NOTE — Progress Notes (Signed)
Hi, can you please call Alejandra Martin and discuss the following:    Her labs show that she has an elevated prolactin. This is a hormone secreted by the brain. It sometimes causes milky discharge from the breasts. It can also suppress a woman's periods. The next step in the evaluation is a head MRI so I will place the order for that and she should schedule it. Once the MRI is complete we can meet to discuss options. Her labs otherwise were normal.     Her cervical cancer screening showed that she was HPV negative but her pap was insufficient. She should make an appointment to return to clinic for testing in 4 months.     Thanks,   Maudie Mercury

## 2016-08-29 ENCOUNTER — Telehealth (HOSPITAL_BASED_OUTPATIENT_CLINIC_OR_DEPARTMENT_OTHER): Payer: Self-pay | Admitting: Registered Nurse

## 2016-08-29 NOTE — Progress Notes (Signed)
Called patient. Reviewed again elevated prolactin, sometimes associated with benign prolactinoma. Discussed could not guarentee not a brain tumor, but very unlikely.   Med list reviewed, on omeprazole and claritin. Not generally known to be associated with elevated prl.   She is very anxious. Recommended seh call her PCP to see if indicated to take something to tx anxiety.   We spent 10 minutes on the phone discussing this.     Carmelina Paddock, MD

## 2016-08-29 NOTE — Progress Notes (Signed)
Spoke with pt. She would like to talk with Dr Bernie Covey again if possible about elevated prolactin. She is very nervous. She scheduled her MRI for 09/13/16. She states that Dr Bernie Covey told her that likely benign tumor may be causing this, but she can't remember all that they talked about because she was nervous.'  Route to MD for review.

## 2016-08-29 NOTE — Telephone Encounter (Signed)
-----   Message from Clarisse Gouge sent at 08/29/2016 10:23 AM EDT -----  Regarding: would like to know blood work results  Contact: [phone]  [place] [id number], 46 year old, female, Telephone Information:  Home Phone      [phone]  Work Phone      Not on file.  Mobile          [phone]      Patient's Preferred Pharmacy:     [facility] [id number] [address] [place]  Phone: [phone] Fax: [phone]    [facility] [place], [place] [facility] [place]  Phone: [phone] Fax: [phone]      CONFIRMED TODAY: [name] NUMBER:  [phone]    Patient's language of care: English    Patient does not need an interpreter.    Patient's PCP: Janann August, MD    Person calling on behalf of patient: Patient (self)    Calls today for test result(s).

## 2016-09-01 ENCOUNTER — Telehealth (HOSPITAL_BASED_OUTPATIENT_CLINIC_OR_DEPARTMENT_OTHER): Payer: Self-pay | Admitting: Registered Nurse

## 2016-09-01 NOTE — Telephone Encounter (Signed)
-----   Message from [place] sent at 09/01/2016 10:05 AM EDT -----  Regarding: sleep issues   Alejandra Martin [id number], 46 year old, female    Calls today:  Clinical Questions (NON-SICK CLINICAL QUESTIONS ONLY)    Specific nature of request requested appt for tomorrow with Dr Garth Bigness only to discuss sleep issues. No appts available, requested call from pcp or her clinical team.   Return phone number [phone]  Person calling on behalf of patient: Patient (self)    Patient's language of care: English    Patient's PCP: Janann August, MD

## 2016-09-01 NOTE — Progress Notes (Signed)
Tc to pt requesting appt with PCP  Would like to talk to provider meds for anxiety and insomnia  She is anxious about elevated prolactin levels and MRI  Denies SI/HI  Appt booked with PCP 09/02/16  Lannie Fields, RN, 09/01/2016, 11:18 AM

## 2016-09-02 ENCOUNTER — Encounter (HOSPITAL_BASED_OUTPATIENT_CLINIC_OR_DEPARTMENT_OTHER): Payer: Self-pay | Admitting: Internal Medicine

## 2016-09-02 ENCOUNTER — Ambulatory Visit (HOSPITAL_BASED_OUTPATIENT_CLINIC_OR_DEPARTMENT_OTHER): Payer: PRIVATE HEALTH INSURANCE | Admitting: Internal Medicine

## 2016-09-02 VITALS — BP 138/90 | HR 84 | Temp 97.9°F | Wt 268.0 lb

## 2016-09-02 DIAGNOSIS — F411 Generalized anxiety disorder: Secondary | ICD-10-CM

## 2016-09-02 DIAGNOSIS — E221 Hyperprolactinemia: Secondary | ICD-10-CM

## 2016-09-02 DIAGNOSIS — J309 Allergic rhinitis, unspecified: Secondary | ICD-10-CM

## 2016-09-02 DIAGNOSIS — G4700 Insomnia, unspecified: Secondary | ICD-10-CM

## 2016-09-02 DIAGNOSIS — G44209 Tension-type headache, unspecified, not intractable: Secondary | ICD-10-CM

## 2016-09-02 NOTE — Progress Notes (Signed)
Patient presents with:  Anxiety  Sleep Problem    ANXIETY  She was having irregular menstrual cycles  She saw her OB/GYN and was told that her prolactin was very high  She is having headaches and anxiety and is unable to sleep  She was told she needs an MRI for her brain   This has made her more worried and caused her to be more anxious  Her next appointment is on 07/28 but she feels it is too far and it is making her anxious  She is worried that the possible tumor may be serious or cancerous  The mention of the tumor and MRI made her very anxious  No galactorrhea  She has never had an MRI before  She spoke with radiology regarding the MRI and she says they told her that perhaps a physician could call radiology to move up the MRI  The MRI is causing her to be very anxious and unable to sleep  She is anxious about the results as well because she is fearful there will be a cancer  She is not taking medication for anxiety and has not taken anything for it in the past for more than a few days  Not depressed or thoughts of self-harm or SI  No problems with vision or visual field defects    HEADACHES  She has been having pressure on the left side of her face  Thinks it may be due to her allergies  No coughing or sneezing, runny nose  No change in vision  Recently she has had issues with her vision being a little blurry   No problems with peripheral vision   Vision issues have been fora while  Has not seen the eye doctor  She did not take her allergy medication for 2 days because she felt nervous about the MRI and did not want to take anyting  When she has her periods she will have headaches  She takes Tylenol and is helpful for her headaches  She has had tension headaches in the past    PMH/MEDS/ALL REVIEWED  Physical Examination:  BP 138/90  Pulse 84  Temp 97.9 F (36.6 C) (Temporal)  Wt 121.6 kg (268 lb)  LMP 08/26/2016  SpO2 98%  BMI 47.47 kg/m2   GEN: NAD   HEENT: OP clear, TM's clear, PERRL, EOMI, fundi  sharp  No facial tenderness  No conjunctivitis  Neck: supple, no LAD, no bruits  LUNGS: Clear to auscultation bilaterally  HEART: normal S1, S2 no murmurs/rubs/gallops  ABD: soft, non-tender, non-distended, no guarding or rebound  EXTREM: no c/c/e  NEURO: alert and oriented x3, CNII-XII intact, Motor 5/5 bilaterally, sensation intact bilaterally, normal gait, DTR's 2+ and symmetric bilaterally       A/P  ANXIETY/Insomnia, elevated prolactin - patient is anxious regarding her MRI and possibility of tumor in her brain. She has elevated prolactin levels and was recommended to get an MRI because of a possible tumor in her brain. The mention of MRI and tumor had made her anxious and unable to sleep.  - counseled patient on diagnosis and treatment of symptoms and signs for concern  - informed patient that it is difficult to move an MRI up as not an urgent issue, but it is making her more anxious and she is unable to sleep so will ask nurse to call radiology and see if they can move it up.  I will ask nurse call patient with update tomorrow  Elevated prolactin   - extensive discussion on work up, ddx, not likely cancer, next steps    HEADACHES- she is having pressure on the left side of her face and previously had tension headaches, likely form stress or tension headaches, unlikely sinus infection  Allergies may be contributing  Normal neuro exam   Had headache with flonase which she declined today  - counseled patient on diagnosis and treatment of symptoms and signs for concern  - recommended saline spray, humidifier  - continue taking claritin  - ibuprofen/Tylenol as needed  - if changes in vision, symptoms worsening, or not improving, call clinic      By signing my name below, I, Judithann Graves, attest that this documentation has been prepared under the direction and in the prescence of Janann August, MD.  Electronically Signed: Judithann Graves, Medical Scribe. 09/02/16 3:48 PM    I, Janann August, personally performed the  services described, in this documentation. All medical record entries made by the scribe were at my direction and in my presence. I have reviewed the chart and discharge instructions and agree that the record reflects my personal performance and is accurate and complete.   Janann August, MD, 09/02/2016, 9:21 PM

## 2016-09-03 ENCOUNTER — Telehealth (HOSPITAL_BASED_OUTPATIENT_CLINIC_OR_DEPARTMENT_OTHER): Payer: Self-pay | Admitting: Registered Nurse

## 2016-09-03 NOTE — Progress Notes (Signed)
Reached radiology appt moved to 09/05/16 645 pm   Pt must there by 615 for exam.   No prep needed.   Left VM for pt to return call.   Lannie Fields, RN, 09/03/2016, 11:49 AM

## 2016-09-03 NOTE — Progress Notes (Signed)
Tc to pt aware of new appt 09/05/16 and to arrive at [place] is still persistent but improved somewhat.    Believes her sx are consistent with anxiety  Hasn't taken any meds today for h/a  Advised APAP 650mg , if no relief to call back.  Lannie Fields, RN, 09/03/2016, 3:33 PM

## 2016-09-03 NOTE — Telephone Encounter (Signed)
-----   Message from [facility] sent at 09/02/2016  9:04 PM EDT -----  Letitia Libra  Can you call radiology/MRI tomorrow and see if this patient's MRI can be moved up?  Patient is having extreme anxiety about the procedure and wants it done as soon as possible.  She is also complaining of headache.  After you speak to radiology, can you check in on patient on how she is doing and let her know what radiology says?  Thanks  Janann August, MD, 09/02/2016, 9:08 PM

## 2016-09-04 NOTE — Progress Notes (Signed)
Pt advised of provider message. No further questions or concerns.

## 2016-09-05 ENCOUNTER — Ambulatory Visit: Payer: Self-pay | Admitting: Obstetrics & Gynecology

## 2016-09-05 DIAGNOSIS — N915 Oligomenorrhea, unspecified: Secondary | ICD-10-CM

## 2016-09-05 DIAGNOSIS — E221 Hyperprolactinemia: Secondary | ICD-10-CM

## 2016-09-05 LAB — CREATININE: CREATININE: 0.9 mg/dL (ref 0.4–1.2)

## 2016-09-05 MED ORDER — GADOTERIDOL 279.3 MG/ML IV SOLN
20.00 mL | Freq: Once | INTRAVENOUS | Status: AC
Start: 2016-09-05 — End: 2016-09-05
  Administered 2016-09-05: 5586 mg via INTRAVENOUS

## 2016-09-05 MED ORDER — SODIUM CHLORIDE 0.9 % IV BOLUS
20.00 mL | Freq: Once | INTRAVENOUS | Status: AC
Start: 2016-09-05 — End: 2016-09-05
  Administered 2016-09-05: 20 mL

## 2016-09-05 NOTE — Addendum Note (Signed)
Addended by: Rick Duff on: 09/05/2016 06:18 PM     Modules accepted: Orders

## 2016-09-05 NOTE — Addendum Note (Signed)
Addended by: Rick Duff on: 09/05/2016 08:01 PM     Modules accepted: Orders, SmartSet

## 2016-09-05 NOTE — Progress Notes (Signed)
Discussed with pt in 08/26/16 encounter

## 2016-09-08 ENCOUNTER — Telehealth (HOSPITAL_BASED_OUTPATIENT_CLINIC_OR_DEPARTMENT_OTHER): Payer: Self-pay | Admitting: Obstetrics & Gynecology

## 2016-09-08 ENCOUNTER — Telehealth (HOSPITAL_BASED_OUTPATIENT_CLINIC_OR_DEPARTMENT_OTHER): Payer: Self-pay | Admitting: Registered Nurse

## 2016-09-08 LAB — MRI SELLA TURCICA W & WO CONTRAST

## 2016-09-08 NOTE — Progress Notes (Signed)
Called number she left and got VM. Given not a contact number in her chart, no message left.     Tried mobile number and left a VM asking her to call us back.     Spoke w/ her on her home line. Discussed results.     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 The stalk is minimally deviated to the left.    She reports left sided headache and we discussed that this could be possibly related. Discussed that this was rarely ever a malignant process. Recommended she return to the clinic to discuss treatment.     Carmelina Paddock, MD  [place]: [place]: [phone]

## 2016-09-08 NOTE — Telephone Encounter (Signed)
-----   Message from Vito Backers sent at 09/08/2016  2:58 PM EDT -----  Regarding: MRI results  Contact: [phone]  Alejandra Martin [id number], 46 year old, female    Calls today:  Test Results  Test Results Request from Patient    What test result(s) is the patient requesting? MRI  Date testing was done 7/20    Person calling on behalf of patient: Patient (self)      Patient's language of care: English    Patient does not need an interpreter.    Patient's PCP: Janann August, MD

## 2016-09-08 NOTE — Progress Notes (Signed)
Pt requesting results of MRI  Pls advise.   Lannie Fields, RN, 09/08/2016, 3:39 PM  .

## 2016-09-09 ENCOUNTER — Telehealth (HOSPITAL_BASED_OUTPATIENT_CLINIC_OR_DEPARTMENT_OTHER): Payer: Self-pay | Admitting: Obstetrics & Gynecology

## 2016-09-09 NOTE — Progress Notes (Signed)
Please help patient make appointment. Thanks! -Maudie Mercury

## 2016-09-09 NOTE — Progress Notes (Signed)
Carmelina Paddock, MD  Sary Shawnia Vizcarrondo   Caller: Unspecified (Yesterday, 3:35 PM)            I called her. Thanks!

## 2016-09-11 ENCOUNTER — Ambulatory Visit (HOSPITAL_BASED_OUTPATIENT_CLINIC_OR_DEPARTMENT_OTHER): Payer: PRIVATE HEALTH INSURANCE | Admitting: Obstetrics & Gynecology

## 2016-09-11 VITALS — BP 110/69 | Wt 263.0 lb

## 2016-09-11 DIAGNOSIS — D352 Benign neoplasm of pituitary gland: Secondary | ICD-10-CM

## 2016-09-11 NOTE — Progress Notes (Signed)
Saw Alejandra Martin in f/u.   Discussed pituitary microadenoma  Discussed medication tx usually bromocriptine/cabergeline.   Discussed that given medical comorbidities (mostly GI), will get econsult with endocrine and may need in-office visit.   Will f/u with her after I have their recommendations.   Answered all her questions and UTD info on pituitary adenomas given.   Alejandra Paddock, MD

## 2016-09-13 ENCOUNTER — Ambulatory Visit (HOSPITAL_BASED_OUTPATIENT_CLINIC_OR_DEPARTMENT_OTHER): Payer: Self-pay | Admitting: Obstetrics & Gynecology

## 2016-09-16 ENCOUNTER — Other Ambulatory Visit (HOSPITAL_BASED_OUTPATIENT_CLINIC_OR_DEPARTMENT_OTHER): Payer: Self-pay | Admitting: Internal Medicine

## 2016-09-16 DIAGNOSIS — D352 Benign neoplasm of pituitary gland: Secondary | ICD-10-CM

## 2016-09-16 NOTE — e-consult (Signed)
Endocrinology e-Consultation    Thank you for referring your patient for an e-consultation. I have not interviewed or examined the patient. My recommendations here are based on the review of relevant clinical information and information provided by the referring provider.    Consultation request:  Patient with oligomenorrhea, eval showed elevated prolactin and head MRI shows pituitary adenoma (8 mm). She has history of reflux/barrett's and wondering if cabergoline/bromocriptine safe for her. Thanks!     For further correspondence/information please refer to the contact information below:    Carmelina Paddock, MD  Case Summary:     46 year old female, during the workup for oligo mens rea, was found to have elevated prolactin level, normal FSH and Estradiol as well as thyroid function test.  Subsequent pituitary MRI reported microadenoma the measure 0.8 cm with minimal stalk deviation.  There is no description with regards to the relevance to optic chiasm. 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 The question is what it's the biological in point we are trying to achieve by considering dopamine agonist. Use of dopamine agonist, especially Cabergoline which is the first-line therapy for hyperprolactinemia  Is fairly safe and well tolerated.  Acid reflex or Barrett's esophagitis are not contraindication for using dopamine agonist.      If fertility is desired or the particularly tumor because some compression symptoms, then there is no doubt that the plumbing agonists should be initiated. However, given the age of the patient and the lack of compression  By pituitary MRI, initiation of the plumbing agonists is not a necessity.OCP may be able to restore normal menstruation if no contraindication. The use of dopamine agonist is not  wrong but is not a necessity neither that depends on patient's preference. The advantage of using dopamine agonist is that you may shrink the pituitary microadenoma, however the treatment usually last for at least two years.     By my reading, I do not think the particularly lesion is abutting optical chiasm,  However, this should be officially stated by the reading radiologist.    Recommendations:  1.  Will suggest to discuss with the patient about the different treatment options.  2.  Will be helpful to have reading radiologist to state the particular lesions relevance to optic chiasm.  3.  There is no contraindication to initiate dopamine agonist and basal endocrine Society guideline,Cabergoline is the first-line therapy  4.  Given the complexity of the case, I will suggest to refer the patient toEndocrinology for office management.      Please let me know whether I can be of further assistance and please let me know how the patient responds to your management.    Sincerely yours,    Primus Gritton H. Thekla Colborn, MD    Patient Disposition : REGULAR FOLLOW UP VISIT NEEDED    Time spent on this e-Consultation :  15 - 30 min

## 2016-09-17 ENCOUNTER — Telehealth (HOSPITAL_BASED_OUTPATIENT_CLINIC_OR_DEPARTMENT_OTHER): Payer: Self-pay | Admitting: Obstetrics & Gynecology

## 2016-09-17 DIAGNOSIS — D352 Benign neoplasm of pituitary gland: Secondary | ICD-10-CM

## 2016-09-17 NOTE — Progress Notes (Signed)
Hi, can you please call Alejandra Martin and let her know that I have heard back from endocrinology. They are suggesting she may not even need treatment for her pituitary adenoma and would like to meet with her in person to discuss. I know she is very anxious and I think this second opinion might be helpful for her. With her permission I'll place the referral, just let me know!  Thanks, Maudie Mercury

## 2016-09-17 NOTE — Progress Notes (Signed)
Pt informed of message from provider  Pt agreeable to referral to endocrinologist

## 2016-09-23 ENCOUNTER — Ambulatory Visit (HOSPITAL_BASED_OUTPATIENT_CLINIC_OR_DEPARTMENT_OTHER): Payer: PRIVATE HEALTH INSURANCE | Admitting: Gastroenterology

## 2016-09-23 VITALS — BP 129/79 | HR 77 | Temp 97.6°F | Resp 14 | Ht 63.0 in | Wt 263.0 lb

## 2016-09-23 DIAGNOSIS — K219 Gastro-esophageal reflux disease without esophagitis: Secondary | ICD-10-CM

## 2016-09-23 DIAGNOSIS — K2970 Gastritis, unspecified, without bleeding: Secondary | ICD-10-CM

## 2016-09-23 DIAGNOSIS — K22719 Barrett's esophagus with dysplasia, unspecified: Secondary | ICD-10-CM

## 2016-09-23 NOTE — Progress Notes (Signed)
This 46 year old Alejandra Martin speaking patient presents for follow up of her Upper GI endoscopy on 07/02/16 for reflux disease.    The patient was found to have...  Findings:       The oropharynx was normal.       LA Grade A (one or more mucosal breaks less than 5 mm, not extending between        tops of 2 mucosal folds) esophagitis with no bleeding was found 41 to 42 cm        from the incisors. This was biopsied with a cold forceps for histology and        evaluation to rule out Barrett's Esophagus. Estimated blood loss was minimal.       No gross lesions were noted in the gastric body and in the gastric antrum.        This was biopsied with a cold forceps for histology and Helicobacter pylori        testing. Estimated blood loss was minimal.       No gross lesions were noted in the third portion of the duodenum. This was        biopsied with a cold forceps for histology and evaluation of celiac sprue.        Estimated blood loss was minimal.       The cardia and gastric fundus were normal on retroflexion.       The exam was otherwise without abnormality.  Post Procedure Diagnosis:       - Normal oropharynx.       - LA Grade A reflux esophagitis. Rule out Barrett's esophagus. Biopsied.       - No gross lesions in the stomach. Biopsied.       - No gross lesions in the third portion of the duodenum. Biopsied.    We reviewed its significance. She is loosing weight. She complained of some discomfort in her anal area. She was concerned about hemorrhoids.    Past Medical History     Allergy, unspecified not elsewhere classified     Acute gastritis without mention of hemorrhage     Intestinal infection due to Clostridium difficile     Pure hypercholesterolemia     Major depressive disorder, single episode, moderate (HCC)     Generalized anxiety disorder     Benign positional vertigo     Contraceptive management     Tubular adenoma of colon     GERD (gastroesophageal reflux disease)     Diarrhea     Cholecystitis     Epigastric  abdominal pain     Mass of ampulla of Vater     Gastroesophageal reflux disease with esophagitis     Obesity     Colon polyp     Mucosal abnormality of duodenum     Abnormal LFTs     Allergic rhinitis due to pollen     Anisometropic amblyopia of right eye     Barrett's esophagus     Disorder of biliary tract     Gastroesophageal reflux disease     Mild intermittent asthma without complication     Prediabetes     Tinea pedis of both feet     Perimenopause     Hyperprolactinemia (HCC)      Past Surgical History  No date: CHOLECYSTECTOMY  No date: NO SIGNIFICANT SURGICAL HISTORY  No date: OB ANTEPARTUM CARE CESAREAN DLVR & POSTPARTUM      Review of Patient's Allergies  indicates:   Erythromycin            Itching   Erythromycin            Itching   Macrolides and keto*    Itching, Rash    Social Hx  Smoking status: Never Smoker                                                              Smokeless tobacco: Never Used                      Alcohol use: No                  Current Outpatient Prescriptions:  loratadine (CLARITIN) 10 MG tablet Take 1 tablet by mouth daily Disp: 30 tablet Rfl: 5   albuterol (PROVENTIL HFA,VENTOLIN HFA, PROAIR HFA) 108 (90 Base) MCG/ACT inhaler Inhale 2 puffs into the lungs every 4 (four) hours as needed for Wheezing or Shortness of breath Disp: 1 Inhaler Rfl: 1   omeprazole (PRILOSEC) 40 MG capsule Take 1 capsule by mouth daily Disp: 30 capsule Rfl: 0     No current facility-administered medications for this visit.     Family Hx   OTHER Father     Comment: leukemia     Diabetes Mother     Hypertension Mother     Lipids Mother     OTHER Mother     Comment: ESRD on HD    Hypertension Father     Hypertension Brother      No family history of colon cancer, IBD.    REVIEW OF SYSTEMS:    Cardiovascular:  No chest pain, palpitations, MI or heart failure.  Pulmonary:  No pneumonia, TB or asthma.    Neuro:  No stroke, seizure or loss of consciousness.    Endocrine:  No diabetes or thyroid disease.       Physical Exam:  Vital Signs: BP 129/79 (Site: LA)  Pulse 77  Temp 97.6 F (36.4 C) (Temporal)  Resp 14  Ht 5\' 3"  (1.6 m)  Wt 119.3 kg (263 lb)  LMP 08/26/2016  SpO2 96%  BMI 46.59 kg/m2 Body mass index is 46.59 kg/m.  General: Obese body habitus.   Pulmonary: Clear to auscultation and percussion. No rales, wheezes or rhonchi.  Cardiovascular: S1, S2, no S3, S4, clicks, rubs or murmurs.  JVD not elevated with patient sitting.  Abdominal: Normal bowel sounds. No tenderness on light or deep palpation. No hepatomegaly or splenomegaly.  GU: anal skin tags. No hemorrhoid    Labs  Path:  >>FINAL DIAGNOSIS<<      BIOPSY DUODENUM, THIRD PORTION:   - NO DIAGNOSTIC ABNORMALITY RECOGNIZED      BIOPSY GASTRIC ANTRUM:   -MILD CHRONIC GASTRITIS.   REACTIVE GASTROPATHY   -IMMUNOHISTOCHEMICAL STAIN FOR H. PYLORI IS PENDING; RESULTS WILL BE ISSUED   IN AN ADDENDUM.      BIOPSY GASTRIC BODY:   FUNDIC MUCOSA WITH MILD GLANDULAR DILATATION, SEE NOTE      Note: Differential diagnosis includes proton pump inhibitor use, NSAID, and   reactive chemical gastropathy.      GASTROESOPHAGEAL JUNCTION, BIOPSY:   -SQUAMOUS AND GLANDULAR MUCOSA PRESENT FOR EVALUATION.   -GLANDULAR MUCOSA WITH CHRONIC INFLAMMATION   -  NO DEFINITIVE INTESTINAL METAPLASIA        Impression:  Barrett's esophagus with dysplasia  (primary encounter diagnosis)  Gastroesophageal reflux disease, esophagitis presence not specified  Gastritis without bleeding, unspecified chronicity, unspecified gastritis type    Medical Decision Making:  The patient should have a follow up Upper GI endoscopy in 3 yrs. We also discussed the fact that she should come in sooner should she have any problems with her bowels. She knows that the Upper GI endoscopy done was not perfect and that we could have missed a lesion. She also knows that it is her responsibility to set up the follow up appointment.    The patient did not have a visible hemorrhoid.    Over 25 minutes spent with the  patient, more than half of which were spent in counseling.

## 2016-10-07 ENCOUNTER — Telehealth (HOSPITAL_BASED_OUTPATIENT_CLINIC_OR_DEPARTMENT_OTHER): Payer: Self-pay | Admitting: Registered Nurse

## 2016-10-07 NOTE — Progress Notes (Signed)
Pls see message from pt re: letter  Lannie Fields, RN, 10/07/2016, 6:51 PM

## 2016-10-07 NOTE — Telephone Encounter (Signed)
-----   Message from Valentina Gu sent at 10/07/2016  3:40 PM EDT -----  Regarding: Letter   Contact: [phone]  Alejandra Martin [id number], 46 year old, female    Calls today:  Letters  Jury Duty - Patient is calling due to not able to do jury due to anxiety and depression. Will like a call back. Thank you   Date of Jury Duty 12/03/16  Juror ID or badge # [id number]  Will patient Pick up  Home addressed confirmed Yes  Person calling on behalf of patient: Patient (self)    CALL BACK NUMBER: [phone]      Patient's language of care: English    Patient does not need an interpreter.    Patient's PCP: Janann August, MD

## 2016-10-09 NOTE — Progress Notes (Signed)
Left VM for pt to return call.   Lannie Fields, RN, 10/09/2016, 12:05 PM

## 2016-10-09 NOTE — Progress Notes (Signed)
Alejandra Martin  You 2 days ago      Please find out more info about specifics as to why she feels she cannot serve on a jury. Need specific reasons an limitations.   Thanks (Routing comment)

## 2016-10-10 ENCOUNTER — Ambulatory Visit (HOSPITAL_BASED_OUTPATIENT_CLINIC_OR_DEPARTMENT_OTHER): Payer: PRIVATE HEALTH INSURANCE | Admitting: Obstetrics & Gynecology

## 2016-10-13 NOTE — Telephone Encounter (Signed)
-----   Message from Vito Backers sent at 10/13/2016  9:48 AM EDT -----  Regarding: returning call  Contact: [phone]  Eleni Frank [id number], 46 year old, female    Calls today:  Clinical Questions ([place])    Name of person calling Patient  Specific nature of request returning call from last week  Return phone number [phone]  Person calling on behalf of patient: Patient (self)        Patient's language of care: English    Patient does not need an interpreter.    Patient's PCP: Janann August, MD

## 2016-10-13 NOTE — Progress Notes (Signed)
Tc to pt states she gets anxiety thinking about serving.   States her anxiety will be high during process.   The last time she had to serve she came home feeling overwhelmed  Also the court date at 830am, she does not have anyone to help drop off child to school.   Lannie Fields, RN, 10/13/2016, 10:23 AM

## 2016-10-14 NOTE — Telephone Encounter (Signed)
-----   Message from Sophronia Simas sent at 10/14/2016  3:12 PM EDT -----  Regarding: update on letter- jury duty   Contact: [phone]  Alejandra Martin [id number], 46 year old, female    Calls today:  Clinical Questions ([place])    Specific nature of request requesting update on jury duty letter that she requested a couple of days ago.   Return phone number [phone]  Person calling on behalf of patient: Patient (self)    Patient's language of care: English    Patient does not need an interpreter.    Patient's PCP: Janann August, MD

## 2016-10-16 NOTE — Progress Notes (Signed)
Lack of childcare is not a medical reason that I could write a letter for.  However,  You may provide a letter as follows if she signs a medical release and if this is what she would like:    "Valeska Haislip has requested this letter regarding her jury duty service.  She is requesting she be excused from jury duty due to anxiety surrounding this process.  She reports feeling overwhelmed after her last time serving as the process increased her anxiety.  Please, take this into consideration and excuse her from jury duty."    Please leave letter for my signature  Thanks    Janann August, MD, 10/16/2016, 11:23 AM

## 2016-10-27 NOTE — Progress Notes (Signed)
Letter drafted and left for MD sign.  Lannie Fields, RN, 10/27/2016, 11:31 AM

## 2016-11-06 NOTE — Progress Notes (Signed)
Chief complaint: hyperprolactinemia, pituitary microadenoma.     History of present illness: The patient is a 46 year old female who is referred to the endocrine clinic for evaluation of the above. History was obtained from the patient and review of the electronic medical records.     We initially learned of this patient through an econsult. She presented with oligomenorrhea for 1 year. Prior to then, she had monthly menses for the most part. She presented to her PCP and lab evaluation showed hyperprolactinemia. Her TSH, testosterone, FSH, and estradiol levels were normal. 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 There is no mention of optic nerve contact, but there is deviation of the stalk to the left. She is referred for further evaluation.     Today, she reports feeling anxious about her diagnosis. She continues to have no periods, but does have pelvic cramps that are mild and daily. Denies galactorrhea or breast tenderness.     Stress: Father passed 4 years ago. Mother went on dialysis 2 years ago.   Marijuana: none  ETOH: none  Chest tattoos: none  Chest pain: none  Headaches: occasional left temporal. Resolves with tylenol.     Regarding her history of GERD, Barrett's esophagus, biliary disease, history of gastritis, she is followed by Dr. Rollene Rotunda. She is currently on omeprazole 40 mg daily.     Menarche: age 63  Periods: regular  [id number]  Her daughter is age 50 years. She conceived after 10 years of trying. She was never told her prolactin was elevated in the past.     Problem History:  Patient Active Problem List:     Allergy, unspecified not elsewhere classified     Acute gastritis without mention of hemorrhage     Intestinal infection due to Clostridium difficile     Pure hypercholesterolemia     Major depressive disorder, single episode, moderate (HCC)     Generalized anxiety disorder     Benign positional vertigo     Contraceptive management     Tubular adenoma of colon      GERD (gastroesophageal reflux disease)     Diarrhea     Cholecystitis     Epigastric abdominal pain     Mass of ampulla of Vater     Gastroesophageal reflux disease with esophagitis     Obesity     Colon polyp     Mucosal abnormality of duodenum     Abnormal LFTs     Allergic rhinitis due to pollen     Anisometropic amblyopia of right eye     Barrett's esophagus     Disorder of biliary tract     Gastroesophageal reflux disease     Mild intermittent asthma without complication     Prediabetes     Tinea pedis of both feet     Perimenopause     Hyperprolactinemia ([place])      Past Medical History:   Past Medical History:  06/12/2004: ACUTE GASTRITIS W/O HEMORRHAGE      Comment:  H. Pylori positive (done at [facility]); GI consult Dr.               Phillis Knack, TOC 10/01/04  03/26/2004: ALLERGY, UNSPECIFIED      Comment:  Seen by ENT, deviated septum, hypertrophy of inferior                nasal turbinates  No date: Anxiety  No date: Asthma  07/26/2004: CLOSTRIDIUM DIFFICILE  Comment:  GI cosult: Dr. Rollene Rotunda, TOC done on 10/01/04  No date: Elevated cholesterol      Comment:  resolved without meds  No date: Esophageal reflux  No date: Snores  No date: Stomach disease  No date: Unspecified asthma(493.90)  No date: Wears eyeglasses      Comment:  night driving    Active Medication:     Outpatient Medications Marked as Taking for the 11/10/16 encounter (Office Visit) with Riley Lam:  loratadine (CLARITIN) 10 MG tablet Take 1 tablet by mouth daily Disp: 30 tablet Rfl: 5   albuterol (PROVENTIL HFA,VENTOLIN HFA, PROAIR HFA) 108 (90 Base) MCG/ACT inhaler Inhale 2 puffs into the lungs every 4 (four) hours as needed for Wheezing or Shortness of breath Disp: 1 Inhaler Rfl: 1   omeprazole (PRILOSEC) 40 MG capsule Take 1 capsule by mouth daily Disp: 30 capsule Rfl: 0         Allergies:   Review of Patient's Allergies indicates:   Erythromycin            Itching   Erythromycin            Itching   Macrolides and keto*     Itching, Rash    Social history:  Social History    Socioeconomic History      Marital status: Married      Spouse name: Not on file      Number of children: Not on file      Years of education: Not on file      Highest education level: Not on file    Social Needs      Financial resource strain: Not on file      Food insecurity - worry: Not on file      Food insecurity - inability: Not on file      Transportation needs - medical: Not on file      Transportation needs - non-medical: Not on file    Occupational History      Not on file    Tobacco Use      Smoking status: Never Smoker      Smokeless tobacco: Never Used    Substance and Sexual Activity      Alcohol use: No      Drug use: No      Sexual activity: Yes        Partners: Male        Comment: men age 89 q 4-6 wks x 3d, small fibroid on u/s, 1st SA age 16, 2 lifetime partners, current x 8 yrs    Other Topics      Concerns:        Not on file    Social History Narrative      07/09/2005      [name] family [place] age 20, husband from [place]      Works FT - [employer]      Pt thrilled with pregnancy-seeking pregnancy x 4 yrs, very anxious since sab 2 yrs ago      Has one child      Not working now      Lives with husband and daughter      Alton Revere, MD, 05/13/2016, 1:50 PM  Accompanied by her sister    Family History:   Family History   Problem Relation Age of Onset    OTHER Father  leukemia     Diabetes Mother     Hypertension Mother     Lipids Mother     OTHER Mother         ESRD on HD    Hypertension Father     Hypertension Brother    No history of pituitary disease.     Review of systems:  General: Weight is stable. Energy is normal. Denies insomnia.   HEENT: No pain or irritation in the eyes, change in vision, or sore throat  NECK: No anterior neck pain, swelling, hoarseness, or dysphagia  CV: No chest pain, no palpitations.   PULM: No SOB, DOE, wheezing, or cough  ABD: No abdominal pain,  nausea, vomiting, diarrhea, or constipation  MSK: No myalgias, bone pain, or fracture. Patient denies change in the size of his hands, feet, ring size or shoe size.  GU: See HPI. Denies polyuria, nocturia or dysuria.   NEURO: No tremors, dizziness. No headache or change in vision.   PSYCH: is anxious today.  SKIN: Patient denies change in hair, skin, or nails. Denies purple stretch marks.  Admits to easy bruising.  ENDO: Denies galactorrhea. Denies breast or nipple piercing. Patient denies heat or cold intolerance. Denies salt craving.    Rest of review of systems are negative except as stated in HPI.     Physical Exam:    BP 125/89  Pulse 93  Wt 120.2 kg (265 lb)  SpO2 99%  BMI 46.94 kg/m2  GEN: Appears well. Does not appear Cushingoid or acromegalic.   PSYCH: normal affect. Alert and oriented.  HEENT: EOMI, no proptosis, lid lag, or periorbital edema. Conjunctiva anicteric.  NECK: Thyroid is normal size, nontender, no bruit. There are no palpable nodules. No cervical or supraclavicular lymphadenopathy.   CV: normal S1 and S2, no murmurs. No extremity edema. Pulses 2+ distally.   PULM: Clear to auscultation bilaterally. No wheezing.  ABD: soft, nontender, obese, positive bowel sounds, no masses palpable, no organomegaly.  MSK: Back is nontender to palpation and without deformity. There is no tenderness along long bones.   NEURO: Cranial nerves intact grossly. Visual field intact to confrontation. No focal weakness. Reflexes are 2+ with normal relaxation phase. No tremor.  SKIN: No acanthosis nigricans, no striae, no hirsutism. Full scalp hair. No acne. Has a few light 1 cm lesions on legs and arms that she reports are bruises.       Laboratory:  Component      Latest Ref Rng & Units 08/18/2016   THYROID SCREEN TSH REFLEX FT4      0.358 - 3.740 uIU/mL 2.410   PROLACTIN      2.2 - 30.3 ng/mL 176.1 (H)   FOLLICLE STIMULATING HORMONE      mIU/mL 8.0   LUTEINIZING HORMONE (LH)      mIU/ml    DHEA SULFATE      45 - 270  ug/dL    TSH (THYROID STIM HORMONE)      0.34 - 5.60 uIU/ml    ESTRADIOL      . pg/mL 65.2     Component      Latest Ref Rng & Units 01/22/2004   THYROID SCREEN TSH REFLEX FT4      0.358 - 3.740 uIU/mL 2.15   PROLACTIN      2.2 - 30.3 ng/mL 60.73   FOLLICLE STIMULATING HORMONE      mIU/mL 4.28   LUTEINIZING HORMONE (LH)      mIU/ml 4.25  DHEA SULFATE      45 - 270 ug/dL 140         Imaging:   I independently reviewed the imaging and agree with the radiologist's interpretation.   Exam Date: 09/05/16  Exam Status: Signed      Exam: MRI SELLA TUR W/O & W CONTRAST    Reason for Exam: Hyperprolactinemia        Clinical Indication: Hyperprolactinemia      Description:      Sagittal T1, coronal T1, coronal T2, axial T2, axial FLAIR, axial T2*,    axial T1 and axial diffusion weighted images through the brain were    performed. 20 intravenous gadolinium were administered. Postcontrast    coronal and sagittal T1-weighted images through the sella turcica were    obtained. Postcontrast axial T1-weighted images through the brain were    obtained including dynamic imaging.      Findings:      There are no intra or extra-axial masses. The ventricular system and    cisterns appear unremarkable. No blood or blood product on the T2*    weighted images are appreciated. No diffusion abnormalities are    present.      Focused evaluation of the sella turcica shows a midline rounded focus    of decreased enhancement within the pituitary gland measuring 8.3 x    4.6 x 6.5 mm. The stalk is minimally deviated to the left. There is no    suprasellar extension.      Postcontrast images of the brain show no enhancing lesions.      The sinuses and mastoid air cells appear clear.      Impression:      8.4 mm pituitary adenoma.        Assessment/plan: The patient is a 46 year old female referred to our clinic for evaluation of hyperprolactinemia, pituitary microadenoma, and oligomenorrhea.    The patient appears  clinically euthyroid and without Cushingoid or acromegalic features on exam. The differential diagnosis for her symptoms include pituitary microprolactinoma vs prolactin elevation due to other etiology or stalk effect. 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 We discussed the pathophysiology and benign nature of these tumors. We discussed the impact hyperprolactinemia can have on periods. We discussed the importance of regular menses for bone health. I recommend trying to maintain regular periods till the average age of menopause: 83.     We discussed treatment which is typically cabergoline in her age. This usually helps to shrink the tumor and normalize the prolactin level. I prefer this over an OCP given the side effect profile of OCP in this age range. However, cabergoline also carries the risk of GERD, nausea, light-headedness, dizziness. Rare is cardiac valve disorder. I will contact Dr. Rollene Rotunda given the patient's extensive GI history to confirm cabergoline is a safe option for her from GI standpoint. I would start with cabergoline 0.25 mg twice a week. She understands it can take 1-3 months for periods to restart. Treatment duration is typically 2 years before consideration of cessation.     In the meantime, I will check her other pituitary hormones. There is no evidence of other hormone excess or deficiency by history of exam. Orders for fasting morning ACTH, cortisol, FT4, IGF-1, and repeat prolactin were placed. I plan to repeat her pituitary MRI in 1 year.     We reviewed rare risk of apoplexy and that she must call me immediately if  develops worse headache of her life, vision changes, dizziness, nausea, or vomiting.     I provided empathy and reassurance multiple times during the visit due to her anxiety over her diagnosis. She was happy and satisfied at the end of the visit.     The patient understands and agrees with the plan. All questions were answered.    The assessment and plan were discussed with the family member present today.     Follow up in 3 months.  Results can be left on a phone message per pt.     Thank you for referring your patient to our endocrine clinic for evaluation. Please do not hesitate to contact me with any questions.     Richardson Landry, MD

## 2016-11-10 ENCOUNTER — Telehealth (HOSPITAL_BASED_OUTPATIENT_CLINIC_OR_DEPARTMENT_OTHER): Payer: Self-pay | Admitting: Registered Nurse

## 2016-11-10 ENCOUNTER — Encounter (HOSPITAL_BASED_OUTPATIENT_CLINIC_OR_DEPARTMENT_OTHER): Payer: Self-pay | Admitting: "Endocrinology

## 2016-11-10 ENCOUNTER — Other Ambulatory Visit (HOSPITAL_BASED_OUTPATIENT_CLINIC_OR_DEPARTMENT_OTHER): Payer: Self-pay | Admitting: "Endocrinology

## 2016-11-10 ENCOUNTER — Ambulatory Visit (HOSPITAL_BASED_OUTPATIENT_CLINIC_OR_DEPARTMENT_OTHER): Payer: PRIVATE HEALTH INSURANCE | Admitting: "Endocrinology

## 2016-11-10 VITALS — BP 125/89 | HR 93 | Wt 265.0 lb

## 2016-11-10 DIAGNOSIS — N914 Secondary oligomenorrhea: Secondary | ICD-10-CM

## 2016-11-10 DIAGNOSIS — D352 Benign neoplasm of pituitary gland: Secondary | ICD-10-CM

## 2016-11-10 MED ORDER — CABERGOLINE 0.5 MG PO TABS
0.2500 mg | ORAL_TABLET | ORAL | 2 refills | Status: DC
Start: 2016-11-10 — End: 2017-02-09

## 2016-11-10 MED ORDER — CABERGOLINE 0.5 MG PO TABS: 0 mg | tablet | ORAL | 2 refills | 0 days | Status: DC

## 2016-11-10 NOTE — Progress Notes (Signed)
Spoke with patient and gave her message as stated.  She repeats that she can start medication and to watch for constipation.    She is going to have labs done tomorrow and asks that she be called with results as she is anxious about that.    I told her I would let dr Allyne Gee know that.

## 2016-11-10 NOTE — Progress Notes (Signed)
Generally feeling good. Just a little crampy.

## 2016-11-10 NOTE — Patient Instructions (Signed)
1. Please fast overnight from midnight onward. Then present to the lab the next morning at 8 AM for your blood test. No caffeine or food before the test is complete.     2. I will contact you with the results and starting the medication after hearing back from Dr. Rollene Rotunda.     3. Follow up in 3 months.

## 2016-11-10 NOTE — Telephone Encounter (Signed)
-----   Message from Riley Lam sent at 11/10/2016  4:16 PM EDT -----  Regarding: medication  Please inform pt that I heard from her GI doctor and he is okay with [name] starting cabergoline. The rx is sent. Dr. Rollene Rotunda wanted her to be aware of constipation as a potential side effect as well.     Thanks,  TG

## 2016-11-11 ENCOUNTER — Ambulatory Visit (HOSPITAL_BASED_OUTPATIENT_CLINIC_OR_DEPARTMENT_OTHER): Payer: PRIVATE HEALTH INSURANCE

## 2016-11-11 DIAGNOSIS — D352 Benign neoplasm of pituitary gland: Secondary | ICD-10-CM

## 2016-11-11 LAB — CORTISOL AM: CORTISOL AM: 13.68 ug/dL (ref 4.30–22.40)

## 2016-11-11 LAB — PROLACTIN: PROLACTIN: 132.7 ng/mL — ABNORMAL HIGH (ref 2.2–30.3)

## 2016-11-11 LAB — FREE THYROXINE: FREE THYROXINE: 0.78 ng/dL (ref 0.76–1.46)

## 2016-11-11 NOTE — Progress Notes (Signed)
Labs drawn.  1 sst and 1 lav sent to lab.  Thayer Dallas, [place], 11/11/2016, 8:49 AM

## 2016-11-12 LAB — ADRENOCORTICOTROPIC HORMONE: ADRENOCORTICOTROPIC HORMONE: 60.8 pg/mL (ref 7.2–63.3)

## 2016-11-12 LAB — SOMATOMEDIN C (IGF-1): SOMATOMEDIN C (IGF-1): 118 ng/mL (ref 57–195)

## 2016-11-13 NOTE — Progress Notes (Signed)
All tests are resulted. Dr Allyne Gee is away.  Will ask Dr He if he is able to discuss with patient.    She is also not clear if she should start taking the cabergoline. She is not sure if Dr Allyne Gee told her to wait for the lab test results or start taking them but she feels that she meant to wait for results.    I told her I would forward to covering MD

## 2016-11-13 NOTE — Telephone Encounter (Signed)
-----   Message from [name] sent at 11/13/2016  9:56 AM EDT -----  Regarding: [place] [id number], 46 year old, female, Telephone Information:  Home Phone      [phone]  Work Phone      Not on file.  Mobile          [phone]      Patient's Preferred Pharmacy:     [facility] [place], [place]  Phone: [phone] Fax: [phone]      CONFIRMED TODAY: [name] NUMBER: [phone]  Best time to call back: /  Cell phone:   Other phone:    Available times:    Patient's language of care: English    Patient does not need an interpreter.    Patient's PCP: Janann August, MD    Person calling on behalf of patient: Patient (self)    Calls today for test result(s).

## 2016-11-14 ENCOUNTER — Telehealth (HOSPITAL_BASED_OUTPATIENT_CLINIC_OR_DEPARTMENT_OTHER): Payer: Self-pay

## 2016-11-14 NOTE — Progress Notes (Signed)
RN Pool  Please call patient; the message is the same as the one on 9/24-   Yes, she should start the cabergoline.   Labs are normal except prolactin, which is about the same as before.  The cabergoline will lower the prolactin level.   mb  Component      Latest Ref Rng & Units 11/11/2016   PROLACTIN      2.2 - 30.3 ng/mL 132.7 (H)   ADRENOCORTICOTROPIC HORMONE      7.2 - 63.3 pg/mL 60.8   CORTISOL AM      4.30 - 22.40 ug/dL 13.68   FREE THYROXINE      0.76 - 1.46 ng/dL 0.78     Component      Latest Ref Rng & Units 11/11/2016   SOMATOMEDIN C (IGF-1)      57 - 195 ng/mL 118     PROLACTIN   Date Value   11/11/2016 132.7 ng/mL (H)   08/18/2016 124.9 ng/mL (H)   01/22/2004 10.69 ng/ml

## 2016-11-14 NOTE — Progress Notes (Signed)
Called the patient and gave her the message

## 2016-11-14 NOTE — Progress Notes (Signed)
See note Marella Bile. Patient contacted

## 2016-11-14 NOTE — Telephone Encounter (Signed)
-----   Message from [facility] sent at 11/14/2016 12:53 PM EDT -----  Regarding: Dr Alvera Singh: [phone]  Crisp [phone], 46 year old, female, Telephone Information:  Home Phone      [phone]  Work Phone      Not on file.  Mobile          [phone]      Patient's Preferred Pharmacy:     [facility] [place], [place]  Phone: [phone] Fax: [phone]      CONFIRMED TODAY: Yes  [phone]  CALL BACK NUMBER:   Best time to call back: today befor 3 pm  Cell phone:   Other phone:    Available times:    Patient's language of care: English    Patient does not need an interpreter.    Patient's PCP: Janann August, MD    Person calling on behalf of patient: Patient (self)    Calls today for test result(s).

## 2016-11-14 NOTE — Progress Notes (Signed)
Called the patient and she was concerned about her prolactin results and if she should start her medication. Please call patient at home first and then her cell. I told her I would send the message.

## 2016-11-26 ENCOUNTER — Telehealth (HOSPITAL_BASED_OUTPATIENT_CLINIC_OR_DEPARTMENT_OTHER): Payer: Self-pay

## 2016-11-26 NOTE — Progress Notes (Signed)
Spoke with pharmacy to find out why they only partially filled patient's prescription, states due to insurance, will only fill a month at a time, will notify patient.

## 2016-11-26 NOTE — Telephone Encounter (Signed)
-----   Message from Gerarda Gunther sent at 11/26/2016  8:47 AM EDT -----  Regarding: DR Liberty [phone], 46 year old, female, Telephone Information:  Home Phone      [phone]  Work Phone      Not on file.  Mobile          [phone]      Patient's Preferred Pharmacy:     [facility] [place], [place]  Phone: [phone] Fax: [phone]      CONFIRMED TODAY: [name] NUMBER:  [phone]  Best time to call back: ANY  Cell phone:   Other phone:    Available times:    Patient's language of care: English    Patient does not need an interpreter.    Patient's PCP: Janann August, MD    Person calling on behalf of patient: Patient (self)    Calls today to speak to provider or a nurse: Gave pt a medication and was only given 4 pills, would like to speak to someone.

## 2016-11-26 NOTE — Progress Notes (Signed)
Spoke with patient who states went to pharmacy to pick up medication and they only gave her 4 tablets and she is wondering why, will contact pharmacy.

## 2016-11-26 NOTE — Progress Notes (Signed)
Spoke with patient and explained it is due to insurance reasons, patient stating understanding.

## 2016-12-05 ENCOUNTER — Ambulatory Visit (HOSPITAL_BASED_OUTPATIENT_CLINIC_OR_DEPARTMENT_OTHER): Payer: PRIVATE HEALTH INSURANCE | Admitting: Obstetrics & Gynecology

## 2016-12-05 ENCOUNTER — Ambulatory Visit (HOSPITAL_BASED_OUTPATIENT_CLINIC_OR_DEPARTMENT_OTHER): Payer: PRIVATE HEALTH INSURANCE

## 2016-12-05 VITALS — BP 117/81 | HR 80 | Wt 267.0 lb

## 2016-12-05 DIAGNOSIS — R102 Pelvic and perineal pain: Secondary | ICD-10-CM

## 2016-12-05 DIAGNOSIS — R87615 Unsatisfactory cytologic smear of cervix: Secondary | ICD-10-CM

## 2016-12-05 DIAGNOSIS — N898 Other specified noninflammatory disorders of vagina: Secondary | ICD-10-CM

## 2016-12-05 LAB — POC URINALYSIS
BILIRUBIN, URINE: NEGATIVE
GLUCOSE,URINE: NEGATIVE
KETONE, URINE: NEGATIVE
LEUKOCYTE ESTERASE: NEGATIVE
NITRITE, URINE: NEGATIVE
PH URINE: 6 (ref 5.0–8.0)
PROTEIN, URINE: NEGATIVE
SPECIFIC GRAVITY, URINE: 1.025 (ref 1.003–1.030)
UROBILINOGEN URINE: 0.2 (ref 0.2–1.0)

## 2016-12-05 NOTE — Progress Notes (Signed)
CC: repeat cotesting/annual    HPI: Alejandra Martin is a 46 year old female here for repeat cotesting/annual  Started on cabergoline x 3 weeks  Takes half a pill 2x/week  Rechecking PRL in 3 months    Getting cramping, small, feels like beginning of period and then resolves. Last period 2 months ago.   Having a small amt of spotting.   Black spotting.   She is sure it is coming from the vagina dn not the rectum.   Has had a colonoscopy (last 2016), due next year, found polyps. Doing q3 years.     Last pelvic US was 05/2016    BP 117/81  Pulse 80  Wt 121.1 kg (267 lb)  BMI 47.3 kg/m2  Physical Exam   Constitutional: She is well-developed, well-nourished, and in no distress. No distress.   HENT:   Head: Normocephalic and atraumatic.   Eyes: EOM are normal.   Neck: Normal range of motion. Neck supple.   Cardiovascular: Normal rate, regular rhythm and normal heart sounds. Exam reveals no gallop and no friction rub.   No murmur heard.  Pulmonary/Chest: Effort normal and breath sounds normal. No respiratory distress. She has no wheezes. She has no rales. Right breast exhibits no inverted nipple, no mass, no nipple discharge, no skin change and no tenderness. Left breast exhibits no inverted nipple, no mass, no nipple discharge, no skin change and no tenderness. Breasts are symmetrical.   Abdominal: Soft. Bowel sounds are normal. She exhibits no distension and no mass. There is no tenderness. There is no rebound and no guarding.   Genitourinary:   Genitourinary Comments: Uterus and adnexa hard to appreciate given habitus. + foul smelling vaginal discharge, yellow, moderate. Vag panel colelcted. Cervix well visualized and without lesions. Small amt of light brown discharge around cervix. No active bleeding.    Lymphadenopathy:        Right: No inguinal adenopathy present.        Left: No inguinal adenopathy present.   Skin: She is not diaphoretic.     A/ repeat cotesting  Breast exam today wnl, not due for MMG  Pelvic pain,  will check pelvic US given unable to eval well  Doing well on cabergoline. Will f/u once she has rechecked her levels in 3 months  To call with results    I have spent 15 minutes in face to face time with this patient/patient proxy of which > 50% was in counseling or coordination of care regarding above issues/Dx.    Carmelina Paddock, MD

## 2016-12-06 LAB — VAGINITIS PANEL PCR
BACTERIAL VAGINOSIS: NEGATIVE
CANDIDA GLABRATA: NEGATIVE
CANDIDA GROUP: NEGATIVE
CANDIDA KRUSEI: NEGATIVE
TRICH VAGINALIS: NEGATIVE

## 2016-12-08 LAB — HUMAN PAPILLOMAVIRUS (HPV): HUMAN PAPILLOMAVIRUS: NEGATIVE

## 2016-12-10 LAB — CYTOPATH, C/V, THIN LAYER

## 2016-12-12 ENCOUNTER — Telehealth (HOSPITAL_BASED_OUTPATIENT_CLINIC_OR_DEPARTMENT_OTHER): Payer: Self-pay | Admitting: Obstetrics & Gynecology

## 2016-12-12 NOTE — Progress Notes (Signed)
Hi, please let Skarlett know that her HPV and pap testing returned normal. Given this, she can have her next pap testing in 3-5 years. Hopefully she is feeling better and I will follow-up with her after her pelvic US.   Thanks,   Maudie Mercury

## 2016-12-15 NOTE — Progress Notes (Signed)
Reached pt. Relayed Dr Marylou Mccoy results message. Pt verbalized understanding.

## 2016-12-19 ENCOUNTER — Ambulatory Visit: Payer: Self-pay | Admitting: Obstetrics & Gynecology

## 2016-12-19 DIAGNOSIS — R102 Pelvic and perineal pain unspecified side: Secondary | ICD-10-CM

## 2016-12-19 LAB — US PELVIC NON-OB W TRANSVAG, 3D, DUPLEX

## 2016-12-19 NOTE — Addendum Note (Signed)
Addended byArdelle Balls on: 12/19/2016 10:22 AM     Modules accepted: Orders

## 2016-12-22 ENCOUNTER — Telehealth (HOSPITAL_BASED_OUTPATIENT_CLINIC_OR_DEPARTMENT_OTHER): Payer: Self-pay | Admitting: Obstetrics & Gynecology

## 2016-12-22 NOTE — Progress Notes (Signed)
RN called pt back and gave results as below. Pt verbalized understanding. She still has some cramping, but improving. RN advised pt to call if any concerns before her f/u in January.

## 2016-12-22 NOTE — Progress Notes (Signed)
Hi, can you please call Alejandra Martin and let her know that her pelvic US returned normal. Hopefully she is feeling better. Thanks! -Maudie Mercury

## 2017-02-09 ENCOUNTER — Ambulatory Visit (HOSPITAL_BASED_OUTPATIENT_CLINIC_OR_DEPARTMENT_OTHER): Payer: PRIVATE HEALTH INSURANCE | Admitting: "Endocrinology

## 2017-02-09 ENCOUNTER — Encounter (HOSPITAL_BASED_OUTPATIENT_CLINIC_OR_DEPARTMENT_OTHER): Payer: Self-pay | Admitting: "Endocrinology

## 2017-02-09 VITALS — BP 114/68 | HR 88 | Temp 98.0°F | Wt 278.0 lb

## 2017-02-09 DIAGNOSIS — E221 Hyperprolactinemia: Secondary | ICD-10-CM

## 2017-02-09 LAB — PROLACTIN: PROLACTIN: 4.3 ng/mL (ref 2.2–30.3)

## 2017-02-09 MED ORDER — CABERGOLINE 0.5 MG PO TABS
0.2500 mg | ORAL_TABLET | ORAL | 2 refills | Status: DC
Start: 2017-02-09 — End: 2017-02-11

## 2017-02-09 MED ORDER — CABERGOLINE 0.5 MG PO TABS: 0 mg | tablet | ORAL | 2 refills | 0 days | Status: DC

## 2017-02-09 NOTE — Patient Instructions (Signed)
Please visit the lab today.    

## 2017-02-09 NOTE — Progress Notes (Signed)
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 She was last seen in 10/2016.     HPI: We initially learned of this patient through an e-consult. She presented with oligomenorrhea for 1 year. Prior to then, she had monthly menses for the most part. 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 Her TSH, testosterone, FSH, and estradiol levels were normal. 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 There is no mention of optic nerve involvement, but there is deviation of the stalk to the left. She is referred for further evaluation.   Denies galactorrhea or breast tenderness. There was no gross neurological deficits on exam.    Stress: Father passed 4 years ago. Mother went on dialysis 2 years ago.   Marijuana: none  ETOH: none  Chest tattoos: none  Chest pain: none  Headaches: occasional left temporal. Resolves with tylenol.   Menarche: age 31  Periods: regular  G2P1AB1  Her daughter is age 27 years. She conceived after 10 years of trying. She was never told her prolactin was elevated in the past.     At her last visit, I started cabergoline 0.25 mg twice a week (Mondays and Thursdays) with plan to repeat labs during this visit. In addition, full pituitary hormone evaluation was completed and negative for other pituitary hormone excess or deficiency.     Today, she reports she is compliant with taking the medication twice a week. She has had no period yet, but had dark brown spotting about 3-4 weeks ago. Cramping is fewer days. No headaches, changes in vision.         Problem History:  Patient Active Problem List:     Allergy, unspecified not elsewhere classified     Acute gastritis without mention of hemorrhage     Intestinal infection due to Clostridium difficile     Pure hypercholesterolemia     Major depressive disorder, single episode, moderate (HCC)     Generalized anxiety disorder     Benign positional  vertigo     Contraceptive management     Tubular adenoma of colon     GERD (gastroesophageal reflux disease)     Diarrhea     Cholecystitis     Epigastric abdominal pain     Mass of ampulla of Vater     Gastroesophageal reflux disease with esophagitis     Obesity     Colon polyp     Mucosal abnormality of duodenum     Abnormal LFTs     Allergic rhinitis due to pollen     Anisometropic amblyopia of right eye     Barrett's esophagus     Disorder of biliary tract     Gastroesophageal reflux disease     Mild intermittent asthma without complication     Prediabetes     Tinea pedis of both feet     Perimenopause     Hyperprolactinemia ([place])      Past Medical History:   Past Medical History:   Diagnosis Date    ACUTE GASTRITIS W/O HEMORRHAGE 06/12/2004    H. Pylori positive (done at [facility]); GI consult Dr. Phillis Knack, TOC 10/01/04    ALLERGY, UNSPECIFIED 03/26/2004    Seen by ENT, deviated septum, hypertrophy of inferior nasal turbinates    Anxiety     Asthma     CLOSTRIDIUM DIFFICILE 07/26/2004    GI cosult: Dr. Rollene Rotunda, TOC done  on 10/01/04    Elevated cholesterol     resolved without meds    Esophageal reflux     Snores     Stomach disease     Unspecified asthma(493.90)     Wears eyeglasses     night driving       Active Medication:     Outpatient Medications Marked as Taking for the 02/09/17 encounter (Office Visit) with Riley Lam:  cabergoline (DOSTINEX) 0.5 MG tablet Take 0.5 tablets by mouth twice a week Disp: 12 tablet Rfl: 2   albuterol (PROVENTIL HFA,VENTOLIN HFA, PROAIR HFA) 108 (90 Base) MCG/ACT inhaler Inhale 2 puffs into the lungs every 4 (four) hours as needed for Wheezing or Shortness of breath Disp: 1 Inhaler Rfl: 1   omeprazole (PRILOSEC) 40 MG capsule Take 1 capsule by mouth daily Disp: 30 capsule Rfl: 0       Allergies:   Review of Patient's Allergies indicates:   Erythromycin            Itching   Erythromycin            Itching   Macrolides and keto*    Itching,  Rash    Social history:  Social History     Socioeconomic History    Marital status: Married     Spouse name: Not on file    Number of children: Not on file    Years of education: Not on file    Highest education level: Not on file   Social Needs    Financial resource strain: Not on file    Food insecurity - worry: Not on file    Food insecurity - inability: Not on file    Transportation needs - medical: Not on file    Transportation needs - non-medical: Not on file   Occupational History    Not on file   Tobacco Use    Smoking status: Never Smoker    Smokeless tobacco: Never Used   Substance and Sexual Activity    Alcohol use: No    Drug use: No    Sexual activity: Yes     Partners: Male     Comment: men age 74 q 4-6 wks x 3d, small fibroid on u/s, 1st SA age 55, 2 lifetime partners, current x 8 yrs   Other Topics Concern    Not on file   Social History Narrative    07/09/2005    [name] family [place] age 89, husband from [place]    Works FT - [employer]    Pt thrilled with pregnancy-seeking pregnancy x 4 yrs, very anxious since sab 2 yrs ago    Has one child    Not working now    Lives with husband and daughter    Alton Revere, MD, 05/13/2016, 1:50 PM   Accompanied by her sister. Unchanged.    Family History:   Family History   Problem Relation Age of Onset    OTHER Father         leukemia     Diabetes Mother     Hypertension Mother     Lipids Mother     OTHER Mother         ESRD on HD    Hypertension Father     Hypertension Brother    No history of pituitary disease. Unchanged.    Review of systems:  Rest of review of systems are negative except  as stated in HPI.     Physical Exam:   BP 114/68  Pulse 88  Temp 98 F (36.7 C)  Wt 126.1 kg (278 lb)  SpO2 97%  BMI 49.25 kg/m2  GEN: Appears well. Does not appear Cushingoid or acromegalic.         Laboratory:  Component      Latest Ref Rng & Units 11/11/2016   ADRENOCORTICOTROPIC HORMONE      7.2 -  63.3 pg/mL 60.8   CORTISOL AM      4.30 - 22.40 ug/dL 13.68   FREE THYROXINE      0.76 - 1.46 ng/dL 0.78   SOMATOMEDIN C (IGF-1)      57 - 195 ng/mL 118     Component      Latest Ref Rng & Units 08/18/2016   THYROID SCREEN TSH REFLEX FT4      0.358 - 3.740 uIU/mL 2.410   PROLACTIN      2.2 - 30.3 ng/mL 952.8 (H)   FOLLICLE STIMULATING HORMONE      mIU/mL 8.0   LUTEINIZING HORMONE (LH)      mIU/ml    DHEA SULFATE      45 - 270 ug/dL    TSH (THYROID STIM HORMONE)      0.34 - 5.60 uIU/ml    ESTRADIOL      . 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    She is currently on treatment with cabergoline 0.25 mg twice a week and due for repeat prolactin level testing. Ideally this test should be done fasting and in the morning, but she is very anxious to have her level checked and has not yet picked up her next refill in case the dose will be changed. If level is normal, will continue her current dose for at least 1 full year prior to considering tapering down. Can consider stopping treatment in 2 years if prolactin level remains normal and there is no pituitary adenoma on repeat imaging.    If prolactin level is not sufficiency decreased, will adjust her cabergoline and repeat lab work in1-2 months.     She is anxious that she may have pituitary cancer or that her prolactinoma may convert to cancer. I tried to reassure her that is very rare and there is no evidence based on her imagine and lab work that she has malignancy of the pituitary. However, as discussed above, monitoring is needed.     I plan to repeat her  pituitary MRI in 1 year.     We reviewed rare risk of apoplexy and that she must call me immediately if develops worse headache of her life, vision changes, dizziness, nausea, or vomiting.    The patient understands and agrees with the plan. All questions were answered.       Follow up in 3 months. Will  send refill once lab results returns.   Results can be left on a phone message per pt.     Thank you for referring your patient to our endocrine clinic for evaluation. Please do not hesitate to contact me with any questions.     Richardson Landry, MD    This visit lasted 20 minutes with greater than 50% of the visit spent on counseling and coordination of care.

## 2017-02-09 NOTE — Addendum Note (Signed)
Addended by: Ulla Potash on: 02/09/2017 02:09 PM     Modules accepted: Orders

## 2017-02-11 ENCOUNTER — Telehealth (HOSPITAL_BASED_OUTPATIENT_CLINIC_OR_DEPARTMENT_OTHER): Payer: Self-pay

## 2017-02-11 ENCOUNTER — Other Ambulatory Visit (HOSPITAL_BASED_OUTPATIENT_CLINIC_OR_DEPARTMENT_OTHER): Payer: Self-pay | Admitting: "Endocrinology

## 2017-02-11 DIAGNOSIS — D352 Benign neoplasm of pituitary gland: Secondary | ICD-10-CM

## 2017-02-11 MED ORDER — CABERGOLINE 0.5 MG PO TABS
0.2500 mg | ORAL_TABLET | ORAL | 3 refills | Status: DC
Start: 2017-02-12 — End: 2017-05-12

## 2017-02-11 MED ORDER — CABERGOLINE 0.5 MG PO TABS: 0 mg | tablet | ORAL | 3 refills | 0 days | Status: DC

## 2017-02-11 NOTE — Progress Notes (Signed)
Attempted to contact patient message left on VM asking patient to please return call to office, contact number provided.               [facility] Pool            Pt with microprolactinoma, who is anxious about test results.   Please call and inform her of the good news that her prolactin level is in the normal range and exactly where I would like it.   Please instruct her to continue her current dose of cabergoline 0.25 mg twice a week. She can take a dose today and second dose tomorrow, then restart her usual regimen next week (I think Mon and Thurs).   I would like to repeat her prolactin level in 3 months (fasting) a day or two before her appt with me.   I have sent a refill of her cabergoline to the pharmacy.   Thanks,   TG

## 2017-02-13 NOTE — Progress Notes (Signed)
Spoke with patient relayed message from physician, patient stating understanding.

## 2017-02-13 NOTE — Telephone Encounter (Signed)
-----   Message from [facility] sent at 02/13/2017 10:54 AM EST -----  Regarding: Dr Allyne Gee  Contact: [phone]  Pax [phone], 46 year old, female, Telephone Information:  Home Phone      [phone]  Work Phone      [phone]  Mobile          [phone]      Patient's Preferred Pharmacy:     [facility] [place], [place]  Phone: [phone] Fax: [phone]      CONFIRMED TODAY: [name] NUMBER:  [phone]        Best time to call back: anytime  Cell phone:   Other phone:    Available times:    Patient's language of care: English    Patient does not need an interpreter.    Patient's PCP: Janann August, MD    Person calling on behalf of patient: Patient (self)    Calls today for test result(s).pt is waiting a phone call from provider to knows the result of a blood work , also she need to know when she can start the medication and instruction .Thank you.

## 2017-02-24 ENCOUNTER — Ambulatory Visit (HOSPITAL_BASED_OUTPATIENT_CLINIC_OR_DEPARTMENT_OTHER): Payer: Self-pay | Admitting: Registered Nurse

## 2017-02-24 ENCOUNTER — Encounter (HOSPITAL_BASED_OUTPATIENT_CLINIC_OR_DEPARTMENT_OTHER): Payer: Self-pay

## 2017-02-24 ENCOUNTER — Emergency Department (HOSPITAL_BASED_OUTPATIENT_CLINIC_OR_DEPARTMENT_OTHER)
Admission: RE | Admit: 2017-02-24 | Disposition: A | Discharge status: Home / Self Care | Payer: Self-pay | Source: Emergency Department | Attending: Emergency Medicine | Admitting: Emergency Medicine

## 2017-02-24 LAB — RBCMORPH

## 2017-02-24 LAB — CBC, PLATELET & DIFFERENTIAL
ABSOLUTE BASO COUNT: 0 10*3/uL (ref 0.0–0.1)
ABSOLUTE EOSINOPHIL COUNT: 0 10*3/uL (ref 0.0–0.8)
ABSOLUTE IMM GRAN COUNT: 0.02 10*3/uL (ref 0.00–0.03)
ABSOLUTE LYMPH COUNT: 2.2 10*3/uL (ref 0.6–5.9)
ABSOLUTE MONO COUNT: 0.5 10*3/uL (ref 0.2–1.4)
ABSOLUTE NEUTROPHIL COUNT: 6.2 10*3/uL (ref 1.6–8.3)
BASOPHIL %: 0.2 % (ref 0.0–1.2)
EOSINOPHIL %: 0.3 % (ref 0.0–7.0)
HEMATOCRIT: 39.2 % (ref 34.1–44.9)
HEMOGLOBIN: 12.3 g/dL (ref 11.2–15.7)
IMMATURE GRANULOCYTE %: 0.2 % (ref 0.0–0.4)
LYMPHOCYTE %: 24.5 % (ref 15.0–54.0)
MEAN CORP HGB CONC: 31.4 g/dL (ref 31.0–37.0)
MEAN CORPUSCULAR HGB: 27.4 pg (ref 26.0–34.0)
MEAN CORPUSCULAR VOL: 87.3 fL (ref 80.0–100.0)
MEAN PLATELET VOLUME: 9.9 fL (ref 8.7–12.5)
MONOCYTE %: 5.1 % (ref 4.0–13.0)
NEUTROPHIL %: 69.7 % (ref 40.0–75.0)
PLATELET COUNT: 310 10*3/uL (ref 150–400)
RBC DISTRIBUTION WIDTH STD DEV: 43.9 fL (ref 35.1–46.3)
RBC DISTRIBUTION WIDTH: 14 % (ref 11.5–14.3)
RED BLOOD CELL COUNT: 4.49 M/uL (ref 3.90–5.20)
WHITE BLOOD CELL COUNT: 9 10*3/uL (ref 4.0–11.0)

## 2017-02-24 LAB — BASIC METABOLIC PANEL
ANION GAP: 12 mmol/L (ref 5–15)
BUN (UREA NITROGEN): 14 mg/dL (ref 7–18)
CALCIUM: 9.4 mg/dL (ref 8.5–10.1)
CARBON DIOXIDE: 24 mmol/L (ref 21–32)
CHLORIDE: 101 mmol/L (ref 98–107)
CREATININE: 0.6 mg/dL (ref 0.4–1.2)
ESTIMATED GLOMERULAR FILT RATE: >60 mL/min (ref >60)
Glucose Random: 80 mg/dL (ref 74–160)
POTASSIUM: 5.4 mmol/L — ABNORMAL HIGH (ref 3.5–5.1)
SODIUM: 137 mmol/L (ref 136–145)

## 2017-02-24 LAB — PLATELET SCAN: PLATELET ESTIMATE: NORMAL

## 2017-02-24 LAB — XR CHEST 2 VIEWS

## 2017-02-24 LAB — TROPONIN I: TROPONIN I: <0.02 ng/mL (ref 0.00–0.04)

## 2017-02-24 MED ORDER — ACETAMINOPHEN 325 MG PO TABS
650.0000 mg | ORAL_TABLET | Freq: Once | ORAL | Status: DC
Start: 2017-02-24 — End: 2017-02-24

## 2017-02-24 MED ORDER — ACETAMINOPHEN 325 MG PO TABS
650.0000 mg | ORAL_TABLET | Freq: Once | ORAL | Status: AC
Start: 2017-02-24 — End: 2017-02-24
  Administered 2017-02-24: 650 mg via ORAL
  Filled 2017-02-24: qty 2

## 2017-02-24 MED ORDER — ACETAMINOPHEN 325 MG PO TABS
650.00 mg | ORAL_TABLET | Freq: Four times a day (QID) | ORAL | 0 refills | Status: AC | PRN
Start: 2017-02-24 — End: 2017-03-01

## 2017-02-24 MED ORDER — ACETAMINOPHEN 325 MG PO TABS: 650 mg | tablet | Freq: Four times a day (QID) | ORAL | 0 refills | 0 days | Status: AC | PRN

## 2017-02-24 NOTE — Narrator Note (Signed)
MD at bedside for reeval

## 2017-02-24 NOTE — Narrator Note (Signed)
Pt reports pain improving with medications.

## 2017-02-24 NOTE — Narrator Note (Signed)
Patient Disposition    Patient education for diagnosis, medications, activity, diet and follow-up.  Patient left ED 4:39 PM.  Patient rep received written instructions.  Interpreter to provide instructions: No    Patient belongings with patient: YES    Have all existing LDAs been addressed? Yes    Have all IV infusions been stopped? Yes    Discharged to: Discharged to home. Pt given RX for tylenol. D/C and follow up instructions reviewed with pt. Pt verbalized understanding of instructions and ambulated to waiting room.

## 2017-02-24 NOTE — ED Provider Notes (Addendum)
[facility] Emergency Medicine Attending Note     History of Present Illness:    Alejandra Martin is a 47 year old female patient with past medical history of esophageal reflux, anxiety, gastritis, who presents with left shoulder discomfort and shortness of breath.  Patient notes that for the last 3 days she has had a tight feeling in her left side of her chest, which radiates over her left shoulder.  She states that she has been taking her asthma inhalers, and that these helped slightly.  She describes it as feeling tight when she tries to take a deep breath.  She denies any overt pain, fever, cough, abdominal pain, nausea, vomiting, leg pain or swelling, recent immobilization.  Patient notes that she took ibuprofen which helped a little bit.  She is taking Tylenol which did not help.    She also notes that 3 days ago her good friend died in the hospital. The patient was seen primarily by me. ED nursing record was reviewed. Prior records as available electronically through the Epic record were reviewed.    Patient's mode of arrival was by Self  Arrival time: 02/24/2017  1:33 PM  Chief complaint: Chest Congestion (URI) and Breathing Problem          Review of Systems:  As per HPI.      Past Medical Hx:  Past Medical History:   Diagnosis Date    ACUTE GASTRITIS W/O HEMORRHAGE 06/12/2004    H. Pylori positive (done at [facility]); GI consult Dr. Phillis Knack, TOC 10/01/04    ALLERGY, UNSPECIFIED 03/26/2004    Seen by ENT, deviated septum, hypertrophy of inferior nasal turbinates    Anxiety     Asthma     CLOSTRIDIUM DIFFICILE 07/26/2004    GI cosult: Dr. Rollene Rotunda, TOC done on 10/01/04    Elevated cholesterol     resolved without meds    Esophageal reflux     Snores     Stomach disease     Unspecified asthma(493.90)     Wears eyeglasses     night driving     Patient Active Problem List:     Allergy, unspecified not elsewhere classified     Acute gastritis without mention of hemorrhage     Intestinal infection due to Clostridium  difficile     Pure hypercholesterolemia     Major depressive disorder, single episode, moderate (HCC)     Generalized anxiety disorder     Benign positional vertigo     Contraceptive management     Tubular adenoma of colon     GERD (gastroesophageal reflux disease)     Diarrhea     Cholecystitis     Epigastric abdominal pain     Mass of ampulla of Vater     Gastroesophageal reflux disease with esophagitis     Obesity     Colon polyp     Mucosal abnormality of duodenum     Abnormal LFTs     Allergic rhinitis due to pollen     Anisometropic amblyopia of right eye     Barrett's esophagus     Disorder of biliary tract     Gastroesophageal reflux disease     Mild intermittent asthma without complication     Prediabetes     Tinea pedis of both feet     Perimenopause     Hyperprolactinemia ([place])   Meds:    No current facility-administered medications for this encounter.   Current Outpatient Medications:  acetaminophen (  TYLENOL) 325 MG tablet Take 2 tablets by mouth every 6 (six) hours as needed for Pain for up to 5 days Disp: 40 tablet Rfl: 0   cabergoline (DOSTINEX) 0.5 MG tablet Take 0.5 tablets by mouth twice a week Disp: 12 tablet Rfl: 3   albuterol (PROVENTIL HFA,VENTOLIN HFA, PROAIR HFA) 108 (90 Base) MCG/ACT inhaler Inhale 2 puffs into the lungs every 4 (four) hours as needed for Wheezing or Shortness of breath Disp: 1 Inhaler Rfl: 1   omeprazole (PRILOSEC) 40 MG capsule Take 1 capsule by mouth daily Disp: 30 capsule Rfl: 0         Past Surgical Hx:  Past Surgical History:  No date: CHOLECYSTECTOMY  No date: NO SIGNIFICANT SURGICAL HISTORY  No date: OB ANTEPARTUM CARE CESAREAN DLVR & POSTPARTUM Allergies:  Review of Patient's Allergies indicates:   Erythromycin            Itching   Erythromycin            Itching   Macrolides and keto*    Itching, Rash   Social Hx:  Social History    Tobacco Use      Smoking status: Never Smoker      Smokeless tobacco: Never Used    Alcohol use: No   Immunizations:  Immunization  History   Administered Date(s) Administered    HEP B ADULT 3 DOSE 20 and > 04/05/2004, 05/03/2004, 10/07/2004    INFLUENZA VIRUS VAC QUAD LIVE INTRANASAL 2-<44YRS 12/27/2008, 01/02/2010, 11/25/2012    Influenza Virus Tri Presv Free 3/> YRS IM 01/20/2006    Pneumococcal,NOS 01/02/2010    Td 04/03/2004, 02/21/2009    Tdap (adacel) 08/05/2012        Physical Examination:       ED Triage Vitals [02/24/17 1408]   ED Triage Vitals Brief Group      Temp 98.3 F      Pulse 87      Resp 18      BP 110/73      SpO2 95 %      Pain Score 4        GENERAL:   no acute distress, non-toxic   SKIN:  Warm & Dry, no rash  HEAD:  NCAT. Sclerae are anicteric and aninjected  NECK:  Supple. No stridor.  LUNGS:  Clear to auscultation bilaterally. No wheezes, rales, rhonchi.   HEART:  RRR.  No murmurs, rubs, or gallops.   ABDOMEN:  Soft, NTND.  No involuntary guarding or rebound.   EXTREMITIES:  No obvious deformities.  Warm and well perfused.  No edema.   NEUROLOGIC:  Alert and oriented x3; moves all extremities well; speaking in clear fluent sentences.   PSYCHIATRIC:  Appropriate for age, time of day, and situation    Medications Given in the ED:  Medications   acetaminophen (TYLENOL) tablet 650 mg (650 mg Oral Given 02/24/17 1510)    Radiology and ECG:  X-ray: Chest (2-view)   Final Result       Electrocardiogram: Normal sinus rhythm with a rate of 84, normal intervals, normal axis, no ischemic changes         Lab Results:  Labs Reviewed   BASIC METABOLIC PANEL - Abnormal; Notable for the following components:       Result Value    POTASSIUM 5.4 (*)     All other components within normal limits    Narrative:     SPECIMEN SLIGHTLY HEMOLYZED   PLATELET SCAN -  Abnormal; Notable for the following components:    PLATELET MORPHOLOGY (ABNORMAL) RARE LARGE PLTS (*)     All other components within normal limits   CBC, PLATELET & DIFFERENTIAL   TROPONIN I   RBCMORPH       Results for orders placed or performed during the hospital encounter of  02/24/17 (from the past 24 hour(s))   CBC, Platelet & Differential    Collection Time: 02/24/17  3:12 PM   Result Value    WHITE BLOOD CELL COUNT 9.0    RED BLOOD CELL COUNT 4.49    HEMOGLOBIN 12.3    HEMATOCRIT 39.2    MEAN CORPUSCULAR VOL 87.3    MEAN CORPUSCULAR HGB 27.4    MEAN CORP HGB CONC 31.4    RBC DISTRIBUTION WIDTH STD DEV 43.9    RBC DISTRIBUTION WIDTH 14.0    PLATELET COUNT 310    MEAN PLATELET VOLUME 9.9    NEUTROPHIL % 69.7    IMMATURE GRANULOCYTE % 0.2    LYMPHOCYTE % 24.5    MONOCYTE % 5.1    EOSINOPHIL % 0.3    BASOPHIL % 0.2    ABSOLUTE NEUTROPHIL COUNT 6.2    ABSOLUTE IMM GRAN COUNT 0.02    ABSOLUTE LYMPH COUNT 2.2    ABSOLUTE MONO COUNT 0.5    ABSOLUTE EOSINOPHIL COUNT 0.0    ABSOLUTE BASO COUNT 0.0   Basic Metabolic Panel    Collection Time: 02/24/17  3:12 PM   Result Value    SODIUM 137    POTASSIUM 5.4 (H)    CHLORIDE 101    CARBON DIOXIDE 24    ANION GAP 12    CALCIUM 9.4    Glucose Random 80    BUN (UREA NITROGEN) 14    CREATININE 0.6    ESTIMATED GLOMERULAR FILT RATE > 60    Narrative    SPECIMEN SLIGHTLY HEMOLYZED   Troponin I    Collection Time: 02/24/17  3:12 PM   Result Value    TROPONIN I < 0.02   Platelet Scan    Collection Time: 02/24/17  3:12 PM   Result Value    PLATELET ESTIMATE NORMAL    PLATELET MORPHOLOGY (ABNORMAL) RARE LARGE PLTS (A)   RBC Morphology    Collection Time: 02/24/17  3:12 PM   Result Value    ANISOCYTOSIS RARE    MICROCYTIC CELLS RARE    HYPOCHROMIA SLIGHT    POIKILOCYTOSIS SLIGHT    OVALOCYTES RARE    STOMATOCYTES SLIGHT    POLYCHROMASIA RARE    Vital Signs:  Patient Vitals for the past 720 hrs:   Temp Pulse Resp BP SpO2   02/24/17 1408 98.3 F 87 18 110/73 95 %   02/24/17 1532 -- 82 17 119/85 96 %   02/24/17 1601 -- 81 20 114/65 98 %   02/24/17 1631 -- 83 19 116/61 96 %          ED Course and Medical Decision-making:  This 47 year old female patient presents with left-sided chest tightness for the last 3 days.  Differential diagnosis includes mild asthma  exacerbation, gastroesophageal reflux, pericarditis, less likely pneumonia, pneumothorax, pericarditis.  I do not think her presentation is consistent with cardiac ischemia, aortic dissection.     In terms of pulmonary embolism, this patient is PERC Negative (age <50, HR<100, O2>95%, No hemoptysis, No OCP or exogenous estrogen/hormones, No recent surgery/trauma, no Hx DVT/PE, no unilateral leg swelling, no clinical signs PE). Based off  of the available and accepted medical literature this makes pulmonary embolism very unlikely and further diagnostic workup is not warranted.    Laboratory tests notable for negative troponin, mildly elevated potassium of 5.4.  This may be due to the NSAID use recently.  Patient advised to avoid NSAIDs and follow-up with her primary care provider for repeat potassium to ensure resolution.  Patient will be discharged with a prescription for acetaminophen.    Reasons to return to the ED were reviewed in detail. All questions were answered. The patient agrees with this plan and disposition.      Condition on Discharge: Stable      Diagnosis/Diagnoses:  Chest discomfort    Medications administered at this visit:   Orders Placed This Encounter      acetaminophen (TYLENOL) tablet 650 mg      acetaminophen (TYLENOL) 325 MG tablet          Sig: Take 2 tablets by mouth every 6 (six) hours as needed for Pain for up to 5 days          Dispense:  40 tablet          Refill:  0      Clarita Crane, MD, MPH  02/24/2017   Dept.of Emergency [place]    This Emergency Department patient encounter note was created using voice-recognition software and in real time during the ED visit. Please excuse any typographical errors that have not yet been reviewed and corrected.

## 2017-02-24 NOTE — ED Triage Note (Signed)
Pt c/o left sided chest and shoulder pressure and sob x 3 days.  Pt placed on cardiac monitor and EKG in progress. Pt NSR on monitor. No signs of acute distress noted. Pt speaking in full sentences. Awaiting eval.

## 2017-02-24 NOTE — Telephone Encounter (Signed)
Regarding: Shortness of breath, denies chest pain -declines 911  ----- Message from Delene Ruffini sent at 02/24/2017 10:21 AM EST -----  Jannet Mantis [id number], 47 year old, female    Calls today:  Sick    What are the symptoms Shortness of breath, denies chest pain -declines 911  How long has patient been sick? 2 days     Person calling on behalf of patient: Patient (self)    Patient's PCP: Janann August, MD

## 2017-02-24 NOTE — Narrator Note (Signed)
Report to Erin RN

## 2017-02-24 NOTE — Discharge Instructions (Signed)
DIAGNOSIS & TREATMENT:  You were seen in a [facility] Emergency Department for chest and shoulder discomfort.   We think your symptoms are likely related to muscle tightness or anxiety.  You were treated with acetaminophen.    TEST RESULTS:  Your EKG is unremarkable.  Your chest x-ray is normal.  Your laboratory tests were unremarkable except for a slightly elevated potassium.  This is likely due to the ibuprofen.  You should follow-up with your primary care provider to have this rechecked.    FURTHER CARE:  Please use over the counter Tylenol (acetaminophen) as needed for pain.   Take  Tylenol (acetaminophen) 650 MG ( 2 regular strength tablets) every 6 hours.     Please make sure you monitor your Tylenol use. Tylenol is the same as acetaminophen. For adults, any amount over 4 grams (4,000 mg) in any 24 hour period can cause liver damage, possibly irreversible.   Please make sure to check all the medications you are taking including over-the counter (OTC) medications. Many multi-symptom medications have acetaminophen in them so please check carefully.    Please contact your doctor or pharmacist for any additional questions.     WHEN SHOULD YOU BE SEEN NEXT?   Please call your doctor and be seen with in the next 2-3 days for re-evaluation if your symptoms are not improving.    WHEN SHOULD YOU RETURN TO THE ED?  Please contact a care provider or return to the Emergency Department (ED) if you experience new or worsening symptoms, palpitations, dizziness or lightheadedness, if you pass out, have trouble breathing, or for any other concerns.

## 2017-02-24 NOTE — Telephone Encounter (Signed)
[id number]  states  Has Acid reflux and asthma  Cough  2 to 3 days having trouble breathing  Stuffy nose   cleaning the house ,dust  take aleve  Having front of the chest tightness, hard to catch the breath  Has been using inhaler albuterol BID  Taking aleve  two tablets yeterday once, and omerprazole.  Back of shoulder to front of chest, tightness, like on my lungs- tightness  Denies nausea,vomiting, sweating, lightheadedness, dizziness, blurred vision  Advised going to the emergency room to be evaluated. Patient aske to be seen in office. I stated we do not have availabilty in [place] or [facility].  Needs to go to the ER, patient agreed to go to the ER.  Janeece Fitting, RN, 02/24/2017, 10:35 AM

## 2017-02-24 NOTE — Narrator Note (Signed)
Assumed care of pt. Pt on continuous cardiac and pulse ox monitoring. Pt NSR on monitor. Pt to x-ray via stretcher with par.

## 2017-02-24 NOTE — Narrator Note (Signed)
Pt returned from x-ray. Pt awaiting results. Pt placed back on continuous cardiac and pulse ox monitoring.

## 2017-02-25 ENCOUNTER — Encounter (HOSPITAL_BASED_OUTPATIENT_CLINIC_OR_DEPARTMENT_OTHER): Payer: Self-pay | Admitting: Physician Assistant

## 2017-02-25 ENCOUNTER — Ambulatory Visit (HOSPITAL_BASED_OUTPATIENT_CLINIC_OR_DEPARTMENT_OTHER): Payer: PRIVATE HEALTH INSURANCE | Admitting: Physician Assistant

## 2017-02-25 ENCOUNTER — Ambulatory Visit (HOSPITAL_BASED_OUTPATIENT_CLINIC_OR_DEPARTMENT_OTHER): Payer: Self-pay | Admitting: Registered Nurse

## 2017-02-25 VITALS — BP 92/63 | HR 90 | Wt 274.6 lb

## 2017-02-25 DIAGNOSIS — R0602 Shortness of breath: Secondary | ICD-10-CM

## 2017-02-25 DIAGNOSIS — E875 Hyperkalemia: Secondary | ICD-10-CM

## 2017-02-25 DIAGNOSIS — F411 Generalized anxiety disorder: Secondary | ICD-10-CM

## 2017-02-25 DIAGNOSIS — M546 Pain in thoracic spine: Secondary | ICD-10-CM

## 2017-02-25 DIAGNOSIS — R0789 Other chest pain: Secondary | ICD-10-CM

## 2017-02-25 LAB — POTASSIUM: POTASSIUM: 4.1 mmol/L (ref 3.5–5.1)

## 2017-02-25 MED ORDER — HYDROXYZINE HCL 50 MG PO TABS
50.00 mg | ORAL_TABLET | Freq: Three times a day (TID) | ORAL | 0 refills | Status: AC | PRN
Start: 2017-02-25 — End: 2017-03-28

## 2017-02-25 MED ORDER — ALBUTEROL SULFATE (2.5 MG/3ML) 0.083% IN NEBU
2.50 mg | INHALATION_SOLUTION | Freq: Once | RESPIRATORY_TRACT | Status: AC
Start: 2017-02-25 — End: 2017-02-25
  Administered 2017-02-25: 2.5 mg via RESPIRATORY_TRACT

## 2017-02-25 MED ORDER — CYCLOBENZAPRINE HCL 5 MG PO TABS
ORAL_TABLET | ORAL | 0 refills | Status: AC
Start: 2017-02-25 — End: 2017-03-07

## 2017-02-25 MED ORDER — HYDROXYZINE HCL 50 MG PO TABS: 50 mg | tablet | Freq: Three times a day (TID) | ORAL | 0 refills | 0 days | Status: AC | PRN

## 2017-02-25 MED ORDER — CYCLOBENZAPRINE HCL 5 MG PO TABS: tablet | ORAL | 0 refills | 0 days | Status: AC

## 2017-02-25 NOTE — Telephone Encounter (Signed)
Regarding: SOB  ----- Message from Rhae Lerner sent at 02/25/2017  9:29 AM EST -----  Alejandra Martin [id number], 47 year old, female    Calls today:  Sick    What are the symptoms patient states still having SOB  How long has patient been sick? yesterday  What has pt. tried at home no  Person calling on behalf of patient: Patient (self)    CALL BACK NUMBER: [phone]  Best time to call back: anytime  Cell phone:   Other phone:    Patient's language of care: English    Patient does not need an interpreter.    Patient's PCP: Janann August, MD

## 2017-02-25 NOTE — Telephone Encounter (Signed)
Pt seen in ER yesterday  EKG and labs unremarkable   Pt states she is still experiencing sob  Pt lost close friend recently and experiencing financial difficulties   Appt booked w/Anna Shelly Rubenstein, RN, 02/25/2017, 11:53 AM

## 2017-02-25 NOTE — Progress Notes (Signed)
Alejandra Martin is a 47 year old female who presents to the office for ED follow up.         As per chart review she presented to ED at [facility] yesterday (02/24/17) complaining of left shoulder pain and SOB x 3 days.  Her Troponin was negative. EKG showed normal sinus rhythm with a rate of 84, normal intervals, normal axis, no ischemic changes.  CXR was negative.  CBCs were fine. BMP was normal except K of 5.4.  Patient reprots that she also had echo at a bed side and was told that her heart was not enlarged and everything fine (I could not find report in epic).  I have reviewed ED not and relevant labs and studies.      Today:  Reports that she continues having muscle soreness in her left shoulder, left arm, left side of her chest and the back of the left shoulder.  Has tightness on the back of her left shoulder and it radiates down her left arm.  She does not recall having h/o injury to her left chest, arm and/or shoulder.  Continues having SOB with exertion and at rest, but it is a little bit better since yesterday.  SOB gets better when she lies down and chest pressure/soreness does not get worse when she lies down.  (-) dizziness, sweating, palpitation.  SOB gets better when she yawns; feels that her lungs open up.  Has been taking OTC tylenol 500 mg 2-6 pills per day.  Reports that last winter she had similar tightness on the left and SOB and when went to ED; was told that it was muskular problem and was prescribed muscle relaxant; was given nebulizer treatment that helped with SOB.  (+) GERD; takes Omeprazole 40 mg every day; feels that it is worse since over the holidays; she was not watching her diet.  (-) family h/o MI and/or other heart disease.  (+) h/o anxiety; feels that her anxiety has been very high and with this pain and SOB it is even worse.  (+) asthma; over the past 4 days she has been using albuterol inhaler twice a day. Feels that it helps with SOB. Otherwise, prior to that she was not using Albuterol  even every week.  (+) very mild dry cough over the past 4 days and no wheezing.  No recent travel  No recent surgery  Not on OCP  Not a smoker.  Her sister has h/o DVT        BP 92/63  Pulse 90  Wt 124.6 kg (274 lb 9.6 oz)  SpO2 98%  BMI 48.64 kg/m2     Physical Exam   Constitutional: She is oriented to person, place, and time. She appears well-developed and well-nourished. No distress.   HENT:   Right Ear: External ear normal.   Left Ear: External ear normal.   Eyes: Right eye exhibits no discharge. Left eye exhibits no discharge. No scleral icterus.   Neck: No JVD present. No tracheal deviation present. No thyromegaly present.   Cardiovascular: Normal rate, regular rhythm and normal heart sounds. Exam reveals no gallop and no friction rub.   No murmur heard.  Pulmonary/Chest: Effort normal. No respiratory distress. She has no wheezes. She has no rales. She exhibits tenderness (on the left side).   Musculoskeletal:   (+) muscle tenderness on the left side of thoracic spine   Lymphadenopathy:     She has no cervical adenopathy.   Neurological: She is alert and  oriented to person, place, and time. She exhibits normal muscle tone. Coordination normal.   Skin: Skin is warm. She is not diaphoretic.   Psychiatric: Her behavior is normal. Judgment and thought content normal.   Mood is anxious and affect is normal   Nursing note and vitals reviewed.      Assessment/Plan:            (R06.02) SOB (shortness of breath)  Comment: SpO2 =98 % on RA today. Since SOB got better after having albuterol nebulizer treatment, I think it most likely due to mild asthma exacerbation.   SOB can be due to anxiety as well.  Plan: -albuterol (PROVENTIL) nebulizer solution 2.5 mg       -advised to return to the office if not better within a week or go to ED for worsening SOB.      (M54.6) Acute left-sided thoracic back pain  (R07.89) Left-sided chest wall pain  Plan: -cyclobenzaprine (FLEXERIL) 5 MG tablet        -advised to return to the  office if not better within a week.    (E87.5) Hyperkalemia  Plan: COLLECTION VENOUS BLOOD VENIPUNCTURE, POTASSIUM    (F41.1) Anxiety state  Plan: -hydrOXYzine (ATARAX) 50 MG tablet  Dicussed that medication might make her feel drowsy        -advised to schedule an appointment with her PCP to discuss her anxiety furhter            I have spent 40 minutes in face to face time with this patient/patient proxy in the room and out side of the room of which > 50% was in counseling or coordination of care regarding above issues/Dx.

## 2017-02-26 ENCOUNTER — Telehealth (HOSPITAL_BASED_OUTPATIENT_CLINIC_OR_DEPARTMENT_OTHER): Payer: Self-pay | Admitting: Registered Nurse

## 2017-02-26 ENCOUNTER — Telehealth (HOSPITAL_BASED_OUTPATIENT_CLINIC_OR_DEPARTMENT_OTHER): Payer: Self-pay | Admitting: Internal Medicine

## 2017-02-26 DIAGNOSIS — J4521 Mild intermittent asthma with (acute) exacerbation: Secondary | ICD-10-CM

## 2017-02-26 MED ORDER — ALBUTEROL SULFATE (2.5 MG/3ML) 0.083% IN NEBU
2.50 mg | INHALATION_SOLUTION | Freq: Four times a day (QID) | RESPIRATORY_TRACT | 0 refills | Status: AC | PRN
Start: 2017-02-26 — End: 2017-03-29

## 2017-02-26 MED ORDER — FLUTICASONE PROPIONATE HFA 44 MCG/ACT IN AERO
2.0000 | INHALATION_SPRAY | Freq: Two times a day (BID) | RESPIRATORY_TRACT | 2 refills | Status: DC
Start: 2017-02-26 — End: 2017-06-29

## 2017-02-26 MED ORDER — ALBUTEROL SULFATE (2.5 MG/3ML) 0.083% IN NEBU: 3 mg | mL | Freq: Four times a day (QID) | RESPIRATORY_TRACT | 0 refills | 0 days | Status: AC | PRN

## 2017-02-26 MED ORDER — FLUTICASONE PROPIONATE HFA 44 MCG/ACT IN AERO: 2 | Inhaler | Freq: Two times a day (BID) | RESPIRATORY_TRACT | 2 refills | 0 days | Status: DC

## 2017-02-26 NOTE — Progress Notes (Signed)
Vito Backers  P Rhc Rn Pool   Phone Number: [phone]            Maleeka Sabatino [id number], 47 year old, female     Calls today: Clinical Questions ([place])    Name of person calling Patient   Specific nature of request returning call   Return phone number [phone]   Person calling on behalf of patient: Patient (self)         Patient's language of care: English     Patient does not need an interpreter.     Patient's PCP: Janann August, MD

## 2017-02-26 NOTE — Progress Notes (Signed)
Ok. That would be great.  I am placing order for albuterol nebulizer.    Thank you very much  Harold Mattes,PA

## 2017-02-26 NOTE — Telephone Encounter (Signed)
-----   Message from Mickle Asper sent at 02/26/2017  9:38 AM EST -----  Regarding: Requesting callback   Contact: [phone]  Aleshka Corney [id number], 47 year old, female    Calls today:  Clinical Questions ([place])    Name of person calling Patient   Specific nature of request Patient requesting callback from Virgel Gess or PCP regarding yesterdays appt   Return phone number [phone]       Patient's language of care: English    Patient does not need an interpreter.    Patient's PCP: Janann August, MD

## 2017-02-26 NOTE — Progress Notes (Signed)
Left VM for pt to return call.   Lannie Fields, RN, 02/26/2017, 10:30 AM

## 2017-02-26 NOTE — Addendum Note (Signed)
Addended by: Virgel Gess on: 02/26/2017 04:53 PM     Modules accepted: Orders

## 2017-02-26 NOTE — Addendum Note (Signed)
Addended by: Virgel Gess on: 02/26/2017 03:18 PM     Modules accepted: Orders

## 2017-02-26 NOTE — Progress Notes (Addendum)
Hello,    I can prescribe it for her. However, I am not sure if she has nebulizer machine. If she feels that albuterol helps with SOB, she might be having Asthma exacerbation. I am going to prescribe for her steroid inhaler, Flovent. She should use it until she sill better and should return to the office if feeling worse or not feeling better within a week.  Otherwise, she should follow up with her PCP or teat in 4-6 weeks.    Thank you  [place], [place]

## 2017-02-26 NOTE — Progress Notes (Signed)
Tc to pt requesting nebulizer and albuterol neb  States tx yesterday was effective.   Pls advise if ok to dispense neb  If ok, pls send albuterol nebs to pharm.  Lannie Fields, RN, 02/26/2017, 3:04 PM

## 2017-02-26 NOTE — Progress Notes (Signed)
Lm to call the office  [place], [place], 02/26/2017, 11:34 AM    (Please call patient and advise her that her potassium level is normal now.)

## 2017-02-28 LAB — EKG

## 2017-03-10 ENCOUNTER — Other Ambulatory Visit (HOSPITAL_BASED_OUTPATIENT_CLINIC_OR_DEPARTMENT_OTHER): Payer: Self-pay | Admitting: Physician Assistant

## 2017-03-10 DIAGNOSIS — K2270 Barrett's esophagus without dysplasia: Secondary | ICD-10-CM

## 2017-03-10 DIAGNOSIS — K219 Gastro-esophageal reflux disease without esophagitis: Secondary | ICD-10-CM

## 2017-03-11 ENCOUNTER — Telehealth (HOSPITAL_BASED_OUTPATIENT_CLINIC_OR_DEPARTMENT_OTHER): Payer: Self-pay | Admitting: Registered Nurse

## 2017-03-11 NOTE — Progress Notes (Signed)
Left VM for pt to return call.   Lannie Fields, RN, 03/11/2017, 11:21 AM

## 2017-03-11 NOTE — Telephone Encounter (Signed)
-----   Message from [facility] sent at 03/11/2017 10:52 AM EST -----  Regarding: health questions   Contact: [phone]  Alejandra Martin [id number], 47 year old, female    Calls today:  Clinical Questions ([place])      Specific nature of request patient is requesting call back from RN , health questions thank you   Return phone number [phone]   Person calling on behalf of patient: Patient (self)    Patient's language of care: English    Patient does not need an interpreter.    Patient's PCP: Janann August, MD

## 2017-03-13 ENCOUNTER — Ambulatory Visit (HOSPITAL_BASED_OUTPATIENT_CLINIC_OR_DEPARTMENT_OTHER): Payer: PRIVATE HEALTH INSURANCE | Admitting: Obstetrics & Gynecology

## 2017-03-16 ENCOUNTER — Telehealth (HOSPITAL_BASED_OUTPATIENT_CLINIC_OR_DEPARTMENT_OTHER): Payer: Self-pay | Admitting: Registered Nurse

## 2017-03-16 ENCOUNTER — Ambulatory Visit (HOSPITAL_BASED_OUTPATIENT_CLINIC_OR_DEPARTMENT_OTHER): Payer: PRIVATE HEALTH INSURANCE | Admitting: Obstetrics & Gynecology

## 2017-03-16 NOTE — Telephone Encounter (Signed)
-----   Message from Alejandra Martin sent at 03/16/2017  2:41 PM EST -----  Regarding: Requesting callback from RN   Contact: [phone]  Alejandra Martin [id number], 47 year old, female    Calls today:  Medication Questions  Patient Calling    What medication do you have questions about Omeprezole  What is your question: Requesting callback from RN regarding refill on prescription and also psych matters as well - did not give more information   Return phone number [phone]      Patient's language of care: English    Patient does not need an interpreter.    Patient's PCP: Janann August, MD

## 2017-03-16 NOTE — Progress Notes (Signed)
Reached pt requesting referral for therapist and refill on omeprazole  Last refill was supposed to come from GI  Appt booked with PCP for requests  Lannie Fields, RN, 03/16/2017, 5:10 PM

## 2017-03-16 NOTE — Telephone Encounter (Signed)
-----   Message from [place] sent at 03/16/2017 10:22 AM EST -----  Regarding: med question   Alejandra Martin [id number], 47 year old, female    Calls today:  Clinical Questions (NON-SICK CLINICAL QUESTIONS ONLY)    Specific nature of request pt called last week and states she never got a call back. States she needs to discuss omeprazole. Prefers to give details to nurse. Ph# confirmed.   Return phone number [phone]   Person calling on behalf of patient: Patient (self)    Patient's language of care: English    Patient's PCP: Janann August, MD

## 2017-03-17 ENCOUNTER — Telehealth (HOSPITAL_BASED_OUTPATIENT_CLINIC_OR_DEPARTMENT_OTHER): Payer: Self-pay | Admitting: Internal Medicine

## 2017-03-17 ENCOUNTER — Ambulatory Visit (HOSPITAL_BASED_OUTPATIENT_CLINIC_OR_DEPARTMENT_OTHER): Payer: PRIVATE HEALTH INSURANCE | Admitting: Internal Medicine

## 2017-03-17 ENCOUNTER — Encounter (HOSPITAL_BASED_OUTPATIENT_CLINIC_OR_DEPARTMENT_OTHER): Payer: Self-pay | Admitting: Obstetrics & Gynecology

## 2017-03-17 VITALS — BP 110/78 | HR 103 | Temp 97.9°F | Ht 63.0 in | Wt 275.0 lb

## 2017-03-17 DIAGNOSIS — K219 Gastro-esophageal reflux disease without esophagitis: Secondary | ICD-10-CM

## 2017-03-17 DIAGNOSIS — H9211 Otorrhea, right ear: Secondary | ICD-10-CM

## 2017-03-17 DIAGNOSIS — K2270 Barrett's esophagus without dysplasia: Secondary | ICD-10-CM

## 2017-03-17 DIAGNOSIS — F411 Generalized anxiety disorder: Secondary | ICD-10-CM

## 2017-03-17 DIAGNOSIS — Z23 Encounter for immunization: Secondary | ICD-10-CM

## 2017-03-17 DIAGNOSIS — F321 Major depressive disorder, single episode, moderate: Secondary | ICD-10-CM

## 2017-03-17 MED ORDER — SERTRALINE HCL 25 MG PO TABS
50.0000 mg | ORAL_TABLET | Freq: Every day | ORAL | 1 refills | Status: DC
Start: 2017-03-17 — End: 2017-06-29

## 2017-03-17 MED ORDER — OMEPRAZOLE 40 MG PO CPDR
40.0000 mg | DELAYED_RELEASE_CAPSULE | Freq: Every day | ORAL | 0 refills | Status: DC
Start: 2017-03-17 — End: 2017-04-20

## 2017-03-17 MED ORDER — SERTRALINE HCL 25 MG PO TABS: 50 mg | tablet | Freq: Every day | ORAL | 1 refills | 0 days | Status: DC

## 2017-03-17 MED ORDER — OMEPRAZOLE 40 MG PO CPDR: 40 mg | capsule | Freq: Every day | ORAL | 0 refills | 0 days | Status: DC

## 2017-03-17 NOTE — Progress Notes (Signed)
NO pa is required. Insurance will not pay for TWO tablets of the 25 mg ( 50 mg ). I called pharmacy to have them fill the initial dose of 25 mg once daily for one week.    A new RX is needed for 50 mg once daily dose to be started after the one week is completed.  Please submit rx if appropriate to pharmacy listed below           [facility] [place], [place] [address]  [place] [address]  Phone: [phone] Fax: [phone]

## 2017-03-17 NOTE — Progress Notes (Signed)
Influenza Vaccine Procedure      1. Has the patient received the information for the influenza vaccine? Yes    2. Does the patient have any of the following contraindications?  Allergy to eggs? No  Allergic reaction to previous influenza vaccines? No  Any other problems to previous influenza vaccines? No  Paralyzed by Guillain-Barre syndrome?  No  Current moderate or severe illness? No  Allergy to contact lens solution? No    3. The vaccine has been administered in the usual fashion.     Immunization information reviewed. Current VIS reviewed and given to patient/ guardian. Verbal assent obtained from patient/ guardian.  See immunization/Injection module or chart review for date of publication and additional information. Verbal assent obtained from patient/guardian. Comfort measures for possible side effects reviewed.

## 2017-03-17 NOTE — Progress Notes (Signed)
Patient presents with:  Referral  Imm/Inj: flu vaccine     Anxiety/Depression  Seen in ED on 02/24/17 with SOB, GERD, and anxiety  Seen at [facility] on 02/25/17 with similar sx  Started on hydroxyzine which she has been taking BID with good relief  Requests starting this long term  SOB has since resolved  Anxiety initially started 5 years ago after her father died  She was seen at [facility] twice a week, but could not afford copays  She feels down, sad, and cries 1-2 times a week  No SI/HI  Has trouble sleeping   Has low energy  Has more recent familial stress as well  Her uncle passed away 1 month ago and her mom is on dialysis  She was started on either prozac/zoloft in the past   She is amenable to starting an SSRI but does not want to start any addictive medications  PHQ-9 score of 6 today and GAD-7 score of 6 today   No further chest discomfort or SOB    GERD/Barrett's Esophagus  Requests refill of omeprazole, ran out 5 days ago  Has an appt with her GI Dr. Rollene Rotunda scheduled for 04/01/17  Used to take 20mg  qd, GI increased to 40mg  qd  This has been helpful and makes her feel less full  Requests refill today  Wonders if this dose is OK    Ear Pain  Has intermittent wet/itchy sensation inside right ear for past 2 weeks  Last episode of this was 3-4 days ago, nothing today    HCM  Amenable to influenza vaccine today    PMH/MEDS/ALL REVIEWED  Physical Examination:  BP 110/78  Pulse 103  Temp 97.9 F (36.6 C) (Temporal)  Ht 5\' 3"  (1.6 m)  Wt 124.7 kg (275 lb)  SpO2 96%  BMI 48.71 kg/m2   GEN: NAD   HEENT: TMs normal b/l  LUNGS: Clear to auscultation bilaterally  HEART: normal S1, S2 no murmurs/rubs/gallops  ABD: soft, non-tender, non-distended, no guarding or rebound  EXTREM: no c/c/e    A/P  Anxiety, depression - seen in ED on 02/24/17 and in the office on 02/25/17 - symptoms have responded to anxiety rx, started on hydroxyzine which she has been taking BID with good effect, has been on SSRI's in past and is amenable to  starting again today, no SI/HI, had good relief from therapy in past and would like referral again today  - Referral placed to psychiatry  - Sertraline as per orders - medication use and SE discussed at length  - Continue hydroxyzine up to TID prn until starting sertraline  - Taking cabergoline for elevated prolactin which may interact with sertraline, discussed with pharmacist who advised it could be started but pt will call if any SE develop    GERD/Barrett's Esophagus - follows with Dr. Rollene Rotunda in GI, takes 40mg  omeprazole daily, requests refill until next appt  - counseled patient on diagnosis and treatment of symptoms and signs for concern  - Refilled omeprazole today - Proper use and side effects explained  - Follow up with Dr. Rollene Rotunda in GI on 04/01/17    Ear drainage - normal exam, reassured, reviewed signs for concern, f/u prn    HCM  Influenza Vaccine ordered today 02/2017    By signing my name below, I, Jaclyn Prime, attest that this documentation has been prepared under the direction and in the prescence of Janann August, M.D.  Electronically Signed: Jaclyn Prime, Medical Scribe. 03/17/17 11:01 AM  I, Anysha Frappier, personally performed the services described in this documentation. All medical record entries made by the scribe were at my direction and in my presence. I have reviewed the chart and discharge instructions (if applicable) and agree that the record reflects my personal performance and is accurate and complete.  Janann August, MD, 03/17/2017, 1:05 PM

## 2017-03-17 NOTE — Progress Notes (Signed)
RN  Please review this med change with patient and I will send rx  Thanks  Janann August, MD, 03/17/2017, 7:52 PM

## 2017-03-17 NOTE — Telephone Encounter (Signed)
-----   Message from [place] sent at 03/17/2017  4:26 PM EST -----  Regarding: Prior Authorization  Prior authorization request for Sertraline 25MG  Tablets  Clinical Indication:   Previous medications tried:   Diagnostic testing information:

## 2017-03-18 ENCOUNTER — Other Ambulatory Visit (HOSPITAL_BASED_OUTPATIENT_CLINIC_OR_DEPARTMENT_OTHER): Payer: Self-pay | Admitting: Social Worker

## 2017-03-18 NOTE — Progress Notes (Signed)
Thank you for your referral. We were able to contact your patient. She was given an appointment for 04/07/17 with Cherlynn Perches.

## 2017-03-20 ENCOUNTER — Other Ambulatory Visit (HOSPITAL_BASED_OUTPATIENT_CLINIC_OR_DEPARTMENT_OTHER): Payer: Self-pay | Admitting: Social Worker

## 2017-03-20 NOTE — Progress Notes (Signed)
Patient called to reschedule.  She was given an appointment for 03/24/17 with Alejandra Martin at [facility], [facility].

## 2017-03-24 ENCOUNTER — Ambulatory Visit (HOSPITAL_BASED_OUTPATIENT_CLINIC_OR_DEPARTMENT_OTHER): Payer: PRIVATE HEALTH INSURANCE

## 2017-03-24 DIAGNOSIS — F331 Major depressive disorder, recurrent, moderate: Secondary | ICD-10-CM

## 2017-03-25 NOTE — Progress Notes (Signed)
ADULT PSYCHIATRY INITIAL EVALUATION    CHIEF COMPLAINT: "Since my dad passed away I haven't been the same"     HISTORY of PRESENT ILLNESS: Pt is a 46yo married [place] mother of an 62yo daughter, having immigrated to the [name] at age 19, with a history of depression and anxiety symptoms which appear to have originated when her daughter was in infancy. Pt appears in treatment with symptoms of depression including lack of energy, lack of motivation, low mood, crying spells, feelings of hopelessness, hypersomnia, and isolation. In addition pt experiences anxiety symptoms including ruminative catastrophic thinking and physical manifestations of anxiety including difficulty breathing/ catching her breath when under anxiety. Pt describes that depressive and anxiety symptoms first manifested following the birth of her 52yo daughter, who had very bad acid reflux and was not able to tolerate breast milk. During the first year of her daughter's life, she had to be hospitalized multiple times, could only tolerate prescription milk. Pt describes significant fear during this time. Less than a year after daughter became more stable with regards to acid reflux, her father was diagnosed with esophogeal cancer. Pt attended most of father's doctors appointments with him when he received treatment, due to her not working to care for her daughter and having available time. Father eventually recovered, but then he was diagnosed with Lukemia shortly after esophageal cancer was in remission. Father passed away approximately five years ago, and pt describes that she has struggled with significant depression and anxiety symptoms since this time. Currently, mother is in dialysis, which is a point of anxiety for the pt.     CURRENT MEDICATIONS: Zoloft, Atarax    Past Medications: Deferred    CURRENT TREATMENT: None currently.     System Involvement: None.    PAST PSYCHIATRIC HISTORY: Was receiving therapy 2x/week at [facility] for a period of  approximately 8 months. Pt states that she started to feel benefit from this treatment but had to discontinue due to change in insurance. Therapy ended approximately one year ago, and since then depression has increased again.    SUBSTANCE USE: None. States that she does not drink or use drugs.     Family Constellation: Pt has 8 brothers and sisters, 46 nieces and nephews. Describes that she is the third youngest sibling. Is married and has an 60yo daughter.    Biological Family History: Further assessment indicated.    CURRENT LIVING SITUATION/CURRENT SUPPORTS: Lives with husband and 58yo daughter. Family is very supportive, but pt describes that she doesn't like to go to them for emotional support. Describes husband as very hard working, and her feeling guilt for not helping out more, not wanting to further burden him emotionally.     Social History: Pt moved to [place] from [name] when she was years old. Pt states family was very poor in [name], which motivated her father to move to [name] for a better standard of living. Uncle was the first family member to come to the [place], followed by her father, who got two jobs to save money to bring the family over. Father was very strict and traditional, and pt was not allowed to go out of the home unless she was working to a large degree. Pt describes family Catholic, very religious growing up. Pt graduated high school, working at least part-time since she was 34yo. Lived in the family home until she met her husband (this was her father's expectation). Pt describes that her father was strict and traditional, but also  very loving and generous. Pt used to work Therapist, art as a Freight forwarder at [employer], and only left work to have her daughter. Hasn't worked since, as she started to take on caretaker responsibilities of first daughter, then father once he became ill, and now her mother. Describes that she accompanies mother to many of her doctors appointments.  Mother is on dialysis, has estimated 4-8 years to live, but perhaps more. 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     Trauma History: None reported.    MEDICAL HISTORY: None.    MENTAL STATUS EXAM:  Appearance: Dressed casually, appearing stated age  Behavior: WNL  Alertness:  WNL  Speech:  WNL  Mood: "I'm just always tired"  Affect:  Dysthymic, tearful  Thought Process: WNl  Thought Content:  Worries  Perceptions:  None  Judgment/Impulse Control: Good    Insight:  Fair to good  Cognition: Good  Suicidal/Homicidal: None    BIO/PSYCHO/SOCIAL AND RISK FORMULATION(S):  Pt is a 47yo [place] married mother of an 72yo daughter suffering from depression and anxiety symptoms. These symptoms appear to be related to health concerns, originating from the time when her daughter was born with bad acid reflux resulting in necessary hospital care of her newborn. Depressive and anxiety symptoms appear to have worsened in the context of father's multiple cancer diagnosis, and following his death approximately five years ago. Pt's mother is also now ill, and is currently in dialysis. Pt may benefit from therapy in order to process losses, and develop skills in which to manage anxiety and depression.  Ethnic and cultural factors: Immigrated to [name] when 47yo, large family, traditional values.  Religious and spiritual beliefs, values and preferences: Catholic, attends church infrequently.    DSM 5 DIAGNOSES:  Primary Psychiatric Diagnosis: Major Depressive Disorder, Recurrent, Moderate with anxious distress (HCC)  Secondary Psychiatric Diagnosis:  Deferred  Other Medical Conditions: Deferred  Psychosocial and Contextual Factors:  Family health issues, not employed      RISK ASSESSMENT (per scale):  Suicide: Low (1)  Violence: Low (1)  Addiction: Low (1)  Protective Factors: Primary  supports, housing, faith    PLAN: Weekly therapy    INFORMED CONSENT (for any new medication):  Patient was informed of the potential risks and benefits of the treatment, including the option not to treat, and appeared to understand and agreed to comply. Discussion included the following key points: Confidentiality, frame of treatment.    Kenae Lindquist, LCSW  .

## 2017-04-01 ENCOUNTER — Ambulatory Visit (HOSPITAL_BASED_OUTPATIENT_CLINIC_OR_DEPARTMENT_OTHER): Payer: PRIVATE HEALTH INSURANCE | Admitting: Gastroenterology

## 2017-04-01 ENCOUNTER — Ambulatory Visit (HOSPITAL_BASED_OUTPATIENT_CLINIC_OR_DEPARTMENT_OTHER): Payer: PRIVATE HEALTH INSURANCE

## 2017-04-01 VITALS — BP 115/79 | HR 100 | Temp 97.9°F | Resp 16 | Ht 63.0 in | Wt 272.0 lb

## 2017-04-01 DIAGNOSIS — K219 Gastro-esophageal reflux disease without esophagitis: Secondary | ICD-10-CM

## 2017-04-01 DIAGNOSIS — K629 Disease of anus and rectum, unspecified: Secondary | ICD-10-CM

## 2017-04-01 DIAGNOSIS — K22719 Barrett's esophagus with dysplasia, unspecified: Secondary | ICD-10-CM

## 2017-04-01 NOTE — Progress Notes (Signed)
This 47 year old English speaking patient presents for follow up of her GERD.    She notes she has had epigastric discomfort. She has had anal discomfort as well.    The patient denied abdominal pain, heartburn, regurgitation, dysphagia, odynophagia, early satiety, weight loss, nausea, vomiting, hematemesis, hematochezia, melena, diarrhea, bowel urgency, constipation, fever, anemia, or jaundice.    The patient admitted to being very worried.    Patient Active Problem List:     Allergy, unspecified not elsewhere classified     Acute gastritis without mention of hemorrhage     Intestinal infection due to Clostridium difficile     Pure hypercholesterolemia     Major depressive disorder, recurrent episode, moderate with anxious distress (HCC)     Generalized anxiety disorder     Benign positional vertigo     Contraceptive management     Tubular adenoma of colon     GERD (gastroesophageal reflux disease)     Diarrhea     Cholecystitis     Epigastric abdominal pain     Mass of ampulla of Vater     Gastroesophageal reflux disease with esophagitis     Obesity     Colon polyp     Mucosal abnormality of duodenum     Abnormal LFTs     Allergic rhinitis due to pollen     Anisometropic amblyopia of right eye     Barrett's esophagus     Disorder of biliary tract     Gastroesophageal reflux disease     Mild intermittent asthma without complication     Prediabetes     Tinea pedis of both feet     Perimenopause     Hyperprolactinemia (HCC)      Past Surgical History:  No date: CHOLECYSTECTOMY  No date: NO SIGNIFICANT SURGICAL HISTORY  No date: OB ANTEPARTUM CARE CESAREAN DLVR & POSTPARTUM    Review of Patient's Allergies indicates:   Erythromycin            Itching   Erythromycin            Itching   Macrolides and keto*    Itching, Rash      Social History    Tobacco Use      Smoking status: Never Smoker      Smokeless tobacco: Never Used    Alcohol use: No    Drug use: No        Current Outpatient Medications:  omeprazole  (PRILOSEC) 40 MG capsule Take 1 capsule by mouth daily Disp: 30 capsule Rfl: 0   sertraline (ZOLOFT) 25 MG tablet Take 2 tablets by mouth daily Start 25mg  daily for one week then increase to 50mg  daily Disp: 60 tablet Rfl: 1   fluticasone (FLOVENT HFA) 44 MCG/ACT inhaler Inhale 2 puffs into the lungs 2 (two) times daily Disp: 1 Inhaler Rfl: 2   albuterol (PROVENTIL) (2.5 MG/3ML) 0.083% nebulizer solution Take 3 mLs by nebulization every 6 (six) hours as needed for Wheezing Disp: 75 mL Rfl: 0   cabergoline (DOSTINEX) 0.5 MG tablet Take 0.5 tablets by mouth twice a week Disp: 12 tablet Rfl: 3   albuterol (PROVENTIL HFA,VENTOLIN HFA, PROAIR HFA) 108 (90 Base) MCG/ACT inhaler Inhale 2 puffs into the lungs every 4 (four) hours as needed for Wheezing or Shortness of breath Disp: 1 Inhaler Rfl: 1     No current facility-administered medications for this visit.     Family Hx  Family History   Problem  Relation Age of Onset    OTHER Father         leukemia     Diabetes Mother     Hypertension Mother     Lipids Mother     OTHER Mother         ESRD on HD    Hypertension Father     Hypertension Brother        REVIEW OF SYSTEMS:    Cardiovascular:  No chest pain, palpitations, MI or heart failure.  Pulmonary:  No pneumonia, TB or asthma.    Neuro:  No stroke, seizure or loss of consciousness.    Endocrine:  No diabetes or thyroid disease.      Physical Exam:  Vital Signs: BP 115/79 (Site: LA)  Pulse 100  Temp 97.9 F (36.6 C) (Temporal)  Resp 16  Ht 5\' 3"  (1.6 m)  Wt 123.4 kg (272 lb)  SpO2 98%  BMI 48.18 kg/m2 Body mass index is 48.18 kg/m.  General: Morbid obesity body habitus.   Pulmonary: Clear to auscultation and percussion. No rales, wheezes or rhonchi.  Cardiovascular: S1, S2, no S3 or murmurs.    Abdominal: Normal bowel sounds. No tenderness on light or deep palpation. No hepatomegaly or splenomegaly.  Neurological: Sensation intact. Normal motor function. Normal gait. Alert and oriented x 3    Pertinent  Labs:  Results for ADINE, HEIMANN (MRN [id number]) as of 04/01/2017 10:38   Ref. Range 02/24/2017 15:12   SODIUM Latest Ref Range: 136 - 145 mmol/L 137   POTASSIUM Latest Ref Range: 3.5 - 5.1 mmol/L 5.4 (H)   CHLORIDE Latest Ref Range: 98 - 107 mmol/L 101   CARBON DIOXIDE Latest Ref Range: 21 - 32 mmol/L 24   ANION GAP Latest Ref Range: 5 - 15 mmol/L 12   CALCIUM Latest Ref Range: 8.5 - 10.1 mg/dL 9.4   Glucose Random Latest Ref Range: 74 - 160 mg/dL 80   BUN (UREA NITROGEN) Latest Ref Range: 7 - 18 mg/dL 14   CREATININE Latest Ref Range: 0.4 - 1.2 mg/dL 0.6   ESTIMATED GLOMERULAR FILT RATE Latest Ref Range: >60 ML/MIN > 60   TROPONIN I Latest Ref Range: 0.00 - 0.04 ng/mL < 0.02   WHITE BLOOD CELL COUNT Latest Ref Range: 4.0 - 11.0 TH/uL 9.0   RED BLOOD CELL COUNT Latest Ref Range: 3.90 - 5.20 M/uL 4.49   HEMOGLOBIN Latest Ref Range: 11.2 - 15.7 g/dL 12.3   HEMATOCRIT Latest Ref Range: 34.1 - 44.9 % 39.2   MEAN CORPUSCULAR VOL Latest Ref Range: 80.0 - 100.0 fL 87.3   MEAN CORPUSCULAR HGB Latest Ref Range: 26.0 - 34.0 pg 27.4   MEAN CORP HGB CONC Latest Ref Range: 31.0 - 37.0 g/dL 31.4   RBC DISTRIBUTION WIDTH STD DEV Latest Ref Range: 35.1 - 46.3 fL 43.9   RBC DISTRIBUTION WIDTH Latest Ref Range: 11.5 - 14.3 % 14.0   PLATELET COUNT Latest Ref Range: 150 - 400 TH/uL 310   MEAN PLATELET VOLUME Latest Ref Range: 8.7 - 12.5 fL 9.9   NEUTROPHIL % Latest Ref Range: 40.0 - 75.0 % 69.7   IMMATURE GRANULOCYTE % Latest Ref Range: 0.0 - 0.4 % 0.2   LYMPHOCYTE % Latest Ref Range: 15.0 - 54.0 % 24.5   MONOCYTE % Latest Ref Range: 4.0 - 13.0 % 5.1   EOSINOPHIL % Latest Ref Range: 0.0 - 7.0 % 0.3   BASOPHIL % Latest Ref Range: 0.0 - 1.2 % 0.2  ABSOLUTE NEUTROPHIL COUNT Latest Ref Range: 1.6 - 8.3 TH/uL 6.2   ABSOLUTE IMM GRAN COUNT Latest Ref Range: 0.00 - 0.03 TH/uL 0.02   ABSOLUTE LYMPH COUNT Latest Ref Range: 0.6 - 5.9 TH/uL 2.2   ABSOLUTE MONO COUNT Latest Ref Range: 0.2 - 1.4 TH/uL 0.5   ABSOLUTE EOSINOPHIL COUNT Latest Ref  Range: 0.0 - 0.8 TH/uL 0.0   ABSOLUTE BASO COUNT Latest Ref Range: 0.0 - 0.1 TH/uL 0.0       Impression:  Gastroesophageal reflux disease, esophagitis presence not specified  Barrett's esophagus with dysplasia    Medical Decision Making:  The patient is doing well.     She is worried about her health. She had a relative die from pancreatic disease (paternal uncle - pancreatic cancer). She is very anxious about her health.     We discussed the fact that she becomes very anxious about her health when these things happen.    We will have her try tucks pads. She will follow up with me in 3 months. If any worsening of her reflux symptoms, abdominal discomfort or anal discomfort, consider appropriate testing. Reviewed evaluations including 2 EGD's done in the past 2 yrs one here and one at [facility] both showing GERD and mild gastritis.    Reviewed with patient who agrees with our plan. Follow up with me in 3 months.

## 2017-04-07 ENCOUNTER — Ambulatory Visit (HOSPITAL_BASED_OUTPATIENT_CLINIC_OR_DEPARTMENT_OTHER): Payer: PRIVATE HEALTH INSURANCE | Admitting: Clinical

## 2017-04-08 ENCOUNTER — Ambulatory Visit (HOSPITAL_BASED_OUTPATIENT_CLINIC_OR_DEPARTMENT_OTHER): Payer: PRIVATE HEALTH INSURANCE

## 2017-04-10 ENCOUNTER — Telehealth (HOSPITAL_BASED_OUTPATIENT_CLINIC_OR_DEPARTMENT_OTHER): Payer: Self-pay

## 2017-04-10 ENCOUNTER — Ambulatory Visit (HOSPITAL_BASED_OUTPATIENT_CLINIC_OR_DEPARTMENT_OTHER): Payer: PRIVATE HEALTH INSURANCE

## 2017-04-10 NOTE — Progress Notes (Signed)
Called pt due to a no show. Requested call back and left contact info.

## 2017-04-17 ENCOUNTER — Ambulatory Visit (HOSPITAL_BASED_OUTPATIENT_CLINIC_OR_DEPARTMENT_OTHER): Payer: PRIVATE HEALTH INSURANCE

## 2017-04-17 DIAGNOSIS — F331 Major depressive disorder, recurrent, moderate: Secondary | ICD-10-CM

## 2017-04-20 ENCOUNTER — Other Ambulatory Visit (HOSPITAL_BASED_OUTPATIENT_CLINIC_OR_DEPARTMENT_OTHER): Payer: Self-pay | Admitting: Internal Medicine

## 2017-04-20 DIAGNOSIS — K219 Gastro-esophageal reflux disease without esophagitis: Secondary | ICD-10-CM

## 2017-04-20 DIAGNOSIS — K2270 Barrett's esophagus without dysplasia: Secondary | ICD-10-CM

## 2017-04-20 MED ORDER — OMEPRAZOLE 40 MG PO CPDR
40.0000 mg | DELAYED_RELEASE_CAPSULE | Freq: Every day | ORAL | 2 refills | Status: DC
Start: 2017-04-20 — End: 2017-09-05

## 2017-04-20 MED ORDER — OMEPRAZOLE 40 MG PO CPDR: 40 mg | capsule | Freq: Every day | ORAL | 2 refills | 0 days | Status: DC

## 2017-04-20 NOTE — Progress Notes (Signed)
PER Pharmacy, Alejandra Martin is a 47 year old female has requested a refill of omeprazole.      Last Office Visit: 03/17/17 with finger, L  Last Physical Exam: 11/16/03    There are no preventive care reminders to display for this patient.    Other Med Adult:  Most Recent BP Reading(s)  04/01/17 : 115/79        Cholesterol (mg/dL)   Date Value   05/14/2016 256 (*H)     LOW DENSITY LIPOPROTEIN DIRECT (mg/dL)   Date Value   05/14/2016 179     HIGH DENSITY LIPOPROTEIN (mg/dL)   Date Value   05/14/2016 42     TRIGLYCERIDES (mg/dL)   Date Value   05/14/2016 132         THYROID SCREEN TSH REFLEX FT4 (uIU/mL)   Date Value   08/18/2016 2.410         TSH (THYROID STIM HORMONE) (uIU/ml)   Date Value   06/28/2004 1.41       HEMOGLOBIN A1C (%)   Date Value   05/14/2016 5.5       No results found for: POCA1C      INR (no units)   Date Value   04/28/2008 1.0 (L)   09/24/2006 1.0 (L)       SODIUM (mmol/L)   Date Value   02/24/2017 137       POTASSIUM (mmol/L)   Date Value   02/25/2017 4.1           CREATININE (mg/dL)   Date Value   02/24/2017 0.6       Documented patient preferred pharmacies:    [facility] [place], [place] - [address] AT [facility] [address]  Phone: [phone] Fax: [phone]

## 2017-04-20 NOTE — Progress Notes (Signed)
OUTPATIENT PSYCHIATRY PROGRESS NOTE    INTERPRETER: No    CONTACT INFO FOR OTHER AGENCIES AND MENTAL HEALTH PROVIDERS (IF APPLICABLE): N/A    PROBLEM(S) ADDRESSED IN THIS SESSION:   - Depressed mood  - Anxiety symptoms and ruminative worries    SUBJECTIVE  TODAY'S CHIEF COMPLAINT AND CLINICAL UPDATES IN PATIENT'S WORDS:  1) Chief Complaint (Patient and/or guardian's own words, concerns and expressed thoughts): "I just feel like I have no energy for anything"    2) New information from patient and/or collateral (Patient's illness: context, course, modifying factors, severity, cultural, family, social, medical history):   - Pt describes symptoms of depression and ways in which this impact everyday life. Pt highlights difficulty with motivation, lack of energy  - Pt describes worries about family members, particularly related to health issues    OBJECTIVE  DATA REVIEWED (Consider medical labs, radiology, other medical tests; screening/outcome measures; psychological testing; discussion of test results with other clinicians; consultation with other clinicians and systems involved with patient, summary of old records): Chart reviewed    CURRENT MEDICATIONS (make clear medications prescribed by psychiatry; include OTC medications):    Current Outpatient Medications:  omeprazole (PRILOSEC) 40 MG capsule Take 1 capsule by mouth daily Disp: 30 capsule Rfl: 0   sertraline (ZOLOFT) 25 MG tablet Take 2 tablets by mouth daily Start 25mg  daily for one week then increase to 50mg  daily Disp: 60 tablet Rfl: 1   fluticasone (FLOVENT HFA) 44 MCG/ACT inhaler Inhale 2 puffs into the lungs 2 (two) times daily Disp: 1 Inhaler Rfl: 2   albuterol (PROVENTIL) (2.5 MG/3ML) 0.083% nebulizer solution Take 3 mLs by nebulization every 6 (six) hours as needed for Wheezing Disp: 75 mL Rfl: 0   cabergoline (DOSTINEX) 0.5 MG tablet Take 0.5 tablets by mouth twice a week Disp: 12 tablet Rfl: 3   albuterol (PROVENTIL HFA,VENTOLIN HFA, PROAIR HFA) 108  (90 Base) MCG/ACT inhaler Inhale 2 puffs into the lungs every 4 (four) hours as needed for Wheezing or Shortness of breath Disp: 1 Inhaler Rfl: 1     No current facility-administered medications for this visit.     MEDICATION ADHERENCE (including barriers and how addressed): Yes  MEDICATION SIDE EFFECTS (Prescribers Only): N/A    BIRTH CONTROL (ask females and males): Deferred    CURRENT PREGNANCY: No                                    MENTAL STATUS EXAMINATION                     General Appearance: Pt appearing well groomed, dressed casually     Interaction with Interviewer (eye contact, attitude, behavior): Within normal limits    Physical Signs    Gait and Station (how patient walks and stands): Within normal limits                  Physical Appearance: Within normal limits          Normal Movements: Within normal limits         Speech (rate, volume, articulation): Within normal limits          Language: [place] first language, speaks [place] fluently                       Mood: I'm always sad I feel like}  Affect: congruent            Thought Process     Rate; concrete vs abstract reasoning: Within normal limits        Logical vs illogical; associations: loose, tangential, circumstantial, intact: Within normal limits                                    Thought Content    Normal Thought Content (other than safety): Within normal limits     Perceptions (auditory, visual, tactile, etc.): None       Impulse Control: Good         Cognition (Link to [facility])    Orientation (person, place, time): oriented x3      Recent and remote memory: Within normal limits           Attention span and concentration: Within normal limits         Fund of knowledge, awareness of current events and vocabulary: Within normal limits      Judgment: Good          Insight: Good        Suicidality/Homicidality/Aggression (Victimization or Perpetration): None. Pt describes religion as protective factor to suicide.       ASSESSMENT   Today's Assessment:  Pt appearing dysthymic and tearful at times as she describes impact of her depression on everyday life. Pt is stable.    Risk Level Assessment  Risk Level Change (if yes, please describe): No    Suicide: low (1)  Violence: low (1)  Addiction: low (1)        Protective Factors: Religious faith, primary supports, housing    DIAGNOSES ASSESSED TODAY (psychiatric diagnoses and medical diagnoses that factor into management of psychiatric treatment): Major Depressive Disorder, recurrent episode, moderate with anxious distress    CLINICAL FORMULATION (Make changes as your understanding changes. Should coincide with treatment plan): Pt is a 47yo [place] married mother of an 7yo daughter suffering from depression and anxiety symptoms. These symptoms appear to be related to health concerns, originating from the time when her daughter was born with bad acid reflux resulting in necessary hospital care of her newborn. Depressive and anxiety symptoms appear to have worsened in the context of father's multiple cancer diagnosis, and following his death approximately five years ago. Pt's mother is also now ill, and is currently in dialysis. Pt may benefit from therapy in order to process losses, and develop skills in which to manage anxiety and depression.      REVIEWING TODAY'S VISIT  CLINICAL INTERVENTIONS TODAY: Active listening, exploratory questions, validation, connecting thoughts/ feelings/ behaviors, psychoeducation about exercise/ diet/ sleep    PATIENT'S RESPONSE TO INTERVENTIONS: Pt talkative and engaged    PROGRESS TOWARDS GOALS: Slow    TIME SPENT IN PSYCHOTHERAPY: 45 minutes    [place] (Consider risk plan for patients at moderate or high risk for suicide/violence/addiction; medication plan; referrals, etc. Must coincide with treatment plan.): Continue to monitor for changes in risk.    PLAN FOR ONGOING TREATMENT:  Weekly psychotherapy    INFORMED CONSENT  (for any new treatment): Patient was informed of the potential risks and benefits of the treatment, including the option not to treat, and appeared to understand and agreed to comply. Discussion included the following key points: Confidentiality, frame of treatment.      ONLY FOR PRESCRIBERS DOING EVALUATION AND MANAGEMENT VISITS  Use only when no psychotherapy performed  COUNSELING AND COORDINATION OF CARE PROVIDED (Consider diagnostic results/impressions and/or recommended studies; risks and benefits of treatment options; instruction for management/treatment and/or follow-up; importance of compliance with chosen treatment options; risk factor reduction; patient/family/caregiver education; prognosis): N/A      Over 50% of time during today's visit was devoted to counseling and/or coordination of care.  If yes, record estimated duration of the face to face encounter.  N/A    INSTRUCTIONS TO COVERING CLINICIANS:  N/A        .

## 2017-04-20 NOTE — Telephone Encounter (Signed)
-----   Message from Waylan Rocher sent at 04/20/2017 10:27 AM EST -----  Regarding: medication refill  [facility]    Person calling on behalf of patient: Pharmacy    May list multiple medications in this section    Medicine Name: omeprazole ([place]) 40 MG capsule     Documented patient preferred pharmacies:   [facility] [place], [place]  Phone: [phone] Fax: [phone]

## 2017-04-22 ENCOUNTER — Ambulatory Visit (HOSPITAL_BASED_OUTPATIENT_CLINIC_OR_DEPARTMENT_OTHER): Payer: PRIVATE HEALTH INSURANCE | Admitting: Internal Medicine

## 2017-04-24 ENCOUNTER — Ambulatory Visit (HOSPITAL_BASED_OUTPATIENT_CLINIC_OR_DEPARTMENT_OTHER): Payer: PRIVATE HEALTH INSURANCE

## 2017-04-24 DIAGNOSIS — F331 Major depressive disorder, recurrent, moderate: Secondary | ICD-10-CM

## 2017-04-27 NOTE — Progress Notes (Signed)
OUTPATIENT PSYCHIATRY PROGRESS NOTE    INTERPRETER: No    CONTACT INFO FOR OTHER AGENCIES AND MENTAL HEALTH PROVIDERS (IF APPLICABLE): N/A    PROBLEM(S) ADDRESSED IN THIS SESSION:   - Depressed mood  - Anxiety symptoms and ruminative worries    SUBJECTIVE  TODAY'S CHIEF COMPLAINT AND CLINICAL UPDATES IN PATIENT'S WORDS:  1) Chief Complaint (Patient and/or guardian's own words, concerns and expressed thoughts): "I had a dream with my father in it this week; I miss him so much"    2) New information from patient and/or collateral (Patient's illness: context, course, modifying factors, severity, cultural, family, social, medical history):   - Pt describes feelings of significant sadness related to the death of her father multiple years ago. Describes continuing to miss him all the time. Attributes father's death to experience of depression  - Describes experience of missing her father, aspects of him and their relationship which she thinks about or particularly misses. Describes history of their relationship    OBJECTIVE  DATA REVIEWED (Consider medical labs, radiology, other medical tests; screening/outcome measures; psychological testing; discussion of test results with other clinicians; consultation with other clinicians and systems involved with patient, summary of old records): Chart reviewed    CURRENT MEDICATIONS (make clear medications prescribed by psychiatry; include OTC medications):    Current Outpatient Medications:  omeprazole (PRILOSEC) 40 MG capsule Take 1 capsule by mouth daily Disp: 30 capsule Rfl: 2   sertraline (ZOLOFT) 25 MG tablet Take 2 tablets by mouth daily Start 25mg  daily for one week then increase to 50mg  daily Disp: 60 tablet Rfl: 1   fluticasone (FLOVENT HFA) 44 MCG/ACT inhaler Inhale 2 puffs into the lungs 2 (two) times daily Disp: 1 Inhaler Rfl: 2   albuterol (PROVENTIL) (2.5 MG/3ML) 0.083% nebulizer solution Take 3 mLs by nebulization every 6 (six) hours as needed for Wheezing Disp: 75  mL Rfl: 0   cabergoline (DOSTINEX) 0.5 MG tablet Take 0.5 tablets by mouth twice a week Disp: 12 tablet Rfl: 3   albuterol (PROVENTIL HFA,VENTOLIN HFA, PROAIR HFA) 108 (90 Base) MCG/ACT inhaler Inhale 2 puffs into the lungs every 4 (four) hours as needed for Wheezing or Shortness of breath Disp: 1 Inhaler Rfl: 1     No current facility-administered medications for this visit.     MEDICATION ADHERENCE (including barriers and how addressed): Yes  MEDICATION SIDE EFFECTS (Prescribers Only): N/A    BIRTH CONTROL (ask females and males): Deferred    CURRENT PREGNANCY: No                                    MENTAL STATUS EXAMINATION                     General Appearance: Pt appearing well groomed, dressed casually     Interaction with Interviewer (eye contact, attitude, behavior): Within normal limits    Physical Signs    Gait and Station (how patient walks and stands): Within normal limits                  Physical Appearance: Within normal limits          Normal Movements: Within normal limits         Speech (rate, volume, articulation): Within normal limits          Language: [place] first language, speaks [place] fluently  Mood: I'm always sad I feel like}            Affect: congruent            Thought Process     Rate; concrete vs abstract reasoning: Within normal limits        Logical vs illogical; associations: loose, tangential, circumstantial, intact: Within normal limits                                    Thought Content    Normal Thought Content (other than safety): Within normal limits     Perceptions (auditory, visual, tactile, etc.): None       Impulse Control: Good         Cognition (Link to [facility])    Orientation (person, place, time): oriented x3      Recent and remote memory: Within normal limits           Attention span and concentration: Within normal limits         Fund of knowledge, awareness of current events and vocabulary: Within normal limits      Judgment:  Good          Insight: Good        Suicidality/Homicidality/Aggression (Victimization or Perpetration): None. Pt describes religion as protective factor to suicide.      ASSESSMENT   Today's Assessment:  Pt appearing dysthymic, fatigued, endorsing depression. Pt is stable.    Risk Level Assessment  Risk Level Change (if yes, please describe): No    Suicide: low (1)  Violence: low (1)  Addiction: low (1)        Protective Factors: Religious faith, primary supports, housing    DIAGNOSES ASSESSED TODAY (psychiatric diagnoses and medical diagnoses that factor into management of psychiatric treatment): Major Depressive Disorder, recurrent episode, moderate with anxious distress    CLINICAL FORMULATION (Make changes as your understanding changes. Should coincide with treatment plan): Pt is a 47yo [place] married mother of an 54yo daughter suffering from depression and anxiety symptoms. These symptoms appear to be related to health concerns, originating from the time when her daughter was born with bad acid reflux resulting in necessary hospital care of her newborn. Depressive and anxiety symptoms appear to have worsened in the context of father's multiple cancer diagnosis, and following his death approximately five years ago. Pt's mother is also now ill, and is currently in dialysis. Pt may benefit from therapy in order to process losses, and develop skills in which to manage anxiety and depression.      REVIEWING TODAY'S VISIT  CLINICAL INTERVENTIONS TODAY: Active listening, exploratory questions, validation, connecting thoughts/ feelings/ behaviors, psychoeducation about exercise/ diet/ sleep, encouraging her to talk about her grief    PATIENT'S RESPONSE TO INTERVENTIONS: Pt talkative and engaged    PROGRESS TOWARDS GOALS: Slow    TIME SPENT IN PSYCHOTHERAPY: 45 minutes    [place] (Consider risk plan for patients at moderate or high risk for suicide/violence/addiction; medication plan; referrals,  etc. Must coincide with treatment plan.): Continue to monitor for changes in risk.    PLAN FOR ONGOING TREATMENT:  Weekly psychotherapy    INFORMED CONSENT (for any new treatment): Patient was informed of the potential risks and benefits of the treatment, including the option not to treat, and appeared to understand and agreed to comply. Discussion included the following key points: Confidentiality, frame of  treatment.      ONLY FOR PRESCRIBERS DOING EVALUATION AND MANAGEMENT VISITS     Use only when no psychotherapy performed  COUNSELING AND COORDINATION OF CARE PROVIDED (Consider diagnostic results/impressions and/or recommended studies; risks and benefits of treatment options; instruction for management/treatment and/or follow-up; importance of compliance with chosen treatment options; risk factor reduction; patient/family/caregiver education; prognosis): N/A      Over 50% of time during today's visit was devoted to counseling and/or coordination of care.  If yes, record estimated duration of the face to face encounter.  N/A    INSTRUCTIONS TO COVERING CLINICIANS:  N/A        .Marland Kitchen

## 2017-05-05 ENCOUNTER — Ambulatory Visit (HOSPITAL_BASED_OUTPATIENT_CLINIC_OR_DEPARTMENT_OTHER): Payer: No Typology Code available for payment source

## 2017-05-05 DIAGNOSIS — F331 Major depressive disorder, recurrent, moderate: Secondary | ICD-10-CM

## 2017-05-05 NOTE — Progress Notes (Signed)
OUTPATIENT PSYCHIATRY PROGRESS NOTE    INTERPRETER: No    CONTACT INFO FOR OTHER AGENCIES AND MENTAL HEALTH PROVIDERS (IF APPLICABLE): N/A    PROBLEM(S) ADDRESSED IN THIS SESSION:   - Depressed mood  - Anxiety symptoms and ruminative worries    SUBJECTIVE  TODAY'S CHIEF COMPLAINT AND CLINICAL UPDATES IN PATIENT'S WORDS:  1) Chief Complaint (Patient and/or guardian's own words, concerns and expressed thoughts): "I've been really stressed with my sister staying with me"    2) New information from patient and/or collateral (Patient's illness: context, course, modifying factors, severity, cultural, family, social, medical history):   - Sister and her two children (32 and 71) are currently staying at the pt's house, due to them having planned to move into a home that was found to have problems (termites) which led them to back out of the sale. 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 Describes difficulty particularly related to seeing father so ill    OBJECTIVE  DATA REVIEWED (Consider medical labs, radiology, other medical tests; screening/outcome measures; psychological testing; discussion of test results with other clinicians; consultation with other clinicians and systems involved with patient, summary of old records): Chart reviewed    CURRENT MEDICATIONS (make clear medications prescribed by psychiatry; include OTC medications):    Current Outpatient Medications:  omeprazole (PRILOSEC) 40 MG capsule Take 1 capsule by mouth daily Disp: 30 capsule Rfl: 2   sertraline (ZOLOFT) 25 MG tablet Take 2 tablets by mouth daily Start 25mg  daily for one week then increase to 50mg  daily Disp: 60 tablet Rfl: 1   fluticasone (FLOVENT HFA) 44 MCG/ACT inhaler Inhale 2 puffs into the lungs 2 (two) times daily  Disp: 1 Inhaler Rfl: 2   albuterol (PROVENTIL) (2.5 MG/3ML) 0.083% nebulizer solution Take 3 mLs by nebulization every 6 (six) hours as needed for Wheezing Disp: 75 mL Rfl: 0   cabergoline (DOSTINEX) 0.5 MG tablet Take 0.5 tablets by mouth twice a week Disp: 12 tablet Rfl: 3   albuterol (PROVENTIL HFA,VENTOLIN HFA, PROAIR HFA) 108 (90 Base) MCG/ACT inhaler Inhale 2 puffs into the lungs every 4 (four) hours as needed for Wheezing or Shortness of breath Disp: 1 Inhaler Rfl: 1     No current facility-administered medications for this visit.     MEDICATION ADHERENCE (including barriers and how addressed): Yes  MEDICATION SIDE EFFECTS (Prescribers Only): N/A    BIRTH CONTROL (ask females and males): Deferred    CURRENT PREGNANCY: No                                    MENTAL STATUS EXAMINATION                     General Appearance: Pt appearing well groomed, dressed casually     Interaction with Interviewer (eye contact, attitude, behavior): Within normal limits    Physical Signs    Gait and Station (how patient walks and stands): Within normal limits                  Physical Appearance: Within normal limits          Normal Movements: Within normal limits  Speech (rate, volume, articulation): Within normal limits          Language: [place] first language, speaks [place] fluently                       Mood: "Things have been tough still"           Affect: congruent            Thought Process     Rate; concrete vs abstract reasoning: Within normal limits        Logical vs illogical; associations: loose, tangential, circumstantial, intact: Within normal limits                                    Thought Content    Normal Thought Content (other than safety): Within normal limits     Perceptions (auditory, visual, tactile, etc.): None       Impulse Control: Good         Cognition (Link to [facility])    Orientation (person, place, time): oriented x3      Recent and remote memory: Within normal limits           Attention  span and concentration: Within normal limits         Fund of knowledge, awareness of current events and vocabulary: Within normal limits      Judgment: Good          Insight: Good        Suicidality/Homicidality/Aggression (Victimization or Perpetration): None. Pt describes religion as protective factor to suicide.      ASSESSMENT   Today's Assessment:  Pt appearing depressed, tearful. Pt is stable.    Risk Level Assessment  Risk Level Change (if yes, please describe): No    Suicide: low (1)  Violence: low (1)  Addiction: low (1)        Protective Factors: Religious faith, primary supports, housing    DIAGNOSES ASSESSED TODAY (psychiatric diagnoses and medical diagnoses that factor into management of psychiatric treatment): Major Depressive Disorder, recurrent episode, moderate with anxious distress    CLINICAL FORMULATION (Make changes as your understanding changes. Should coincide with treatment plan): Pt is a 47yo [place] married mother of an 73yo daughter suffering from depression and anxiety symptoms. These symptoms appear to be related to health concerns, originating from the time when her daughter was born with bad acid reflux resulting in necessary hospital care of her newborn. Depressive and anxiety symptoms appear to have worsened in the context of father's multiple cancer diagnosis, and following his death approximately five years ago. Pt's mother is also now ill, and is currently in dialysis. Pt may benefit from therapy in order to process losses, and develop skills in which to manage anxiety and depression.      REVIEWING TODAY'S VISIT  CLINICAL INTERVENTIONS TODAY: Active listening, exploratory questions, validation, connecting thoughts/ feelings/ behaviors, psychoeducation about exercise/ diet/ sleep, encouraging her to talk about her grief    PATIENT'S RESPONSE TO INTERVENTIONS: Pt talkative and engaged    PROGRESS TOWARDS GOALS: Slow    TIME SPENT IN PSYCHOTHERAPY: 45 minutes    [place] (Consider risk plan for patients at moderate or high risk for suicide/violence/addiction; medication plan; referrals, etc. Must coincide with treatment plan.): Continue to monitor for changes in risk.    PLAN FOR ONGOING TREATMENT:  Weekly psychotherapy  INFORMED CONSENT (for any new treatment): Patient was informed of the potential risks and benefits of the treatment, including the option not to treat, and appeared to understand and agreed to comply. Discussion included the following key points: Confidentiality, frame of treatment.      ONLY FOR PRESCRIBERS DOING EVALUATION AND MANAGEMENT VISITS     Use only when no psychotherapy performed  COUNSELING AND COORDINATION OF CARE PROVIDED (Consider diagnostic results/impressions and/or recommended studies; risks and benefits of treatment options; instruction for management/treatment and/or follow-up; importance of compliance with chosen treatment options; risk factor reduction; patient/family/caregiver education; prognosis): N/A      Over 50% of time during today's visit was devoted to counseling and/or coordination of care.  If yes, record estimated duration of the face to face encounter.  N/A    INSTRUCTIONS TO COVERING CLINICIANS:  N/A        .Marland KitchenMarland Kitchen

## 2017-05-11 ENCOUNTER — Ambulatory Visit (HOSPITAL_BASED_OUTPATIENT_CLINIC_OR_DEPARTMENT_OTHER): Payer: PRIVATE HEALTH INSURANCE | Admitting: "Endocrinology

## 2017-05-11 ENCOUNTER — Encounter (HOSPITAL_BASED_OUTPATIENT_CLINIC_OR_DEPARTMENT_OTHER): Payer: Self-pay | Admitting: "Endocrinology

## 2017-05-11 VITALS — BP 115/74 | HR 97 | Ht 63.0 in | Wt 281.0 lb

## 2017-05-11 DIAGNOSIS — E221 Hyperprolactinemia: Secondary | ICD-10-CM

## 2017-05-11 LAB — PROLACTIN: PROLACTIN: 1 ng/mL — ABNORMAL LOW (ref 2.2–30.3)

## 2017-05-11 NOTE — Patient Instructions (Signed)
Please visit the lab today.   Please schedule pituitary MRI

## 2017-05-11 NOTE — Progress Notes (Signed)
The patient is a 47 year old female with history of microprolactinoma here for follow up. She was last seen in 01/2017.     HPI: We initially learned of this patient through an e-consult. She presented with oligomenorrhea for 1 year. Prior to then, she had monthly menses for the most part. 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 Her TSH, testosterone, FSH, and estradiol levels were normal. 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 There is no mention of optic nerve involvement, but there is deviation of the stalk to the left.   Denies galactorrhea or breast tenderness. There were no gross neurological deficits on exam.    Stress: Father passed 4 years ago. Mother went on dialysis 2 years ago.   Marijuana: none  ETOH: none  Chest tattoos: none  Chest pain: none  Headaches: occasional left temporal. Resolves with tylenol.   Menarche: age 27  Periods: regular  [id number]  Her daughter is age 29 years. She conceived after 10 years of trying. She was never told her prolactin was elevated in the past.     I started cabergoline 0.25 mg twice a week (Mondays and Thursdays) and her prolactin decreased into the normal range. In addition, full pituitary hormone evaluation was completed and negative for other pituitary hormone excess or deficiency. Periods have resumed and are monthly. Cramping is much less and overall every back to baseline.     Interim history: was seen in ED and also by her PCP for depression and anxiety. Now on treatment. Mood has improved    Today, she reports she is compliant with taking the medication twice a week. No nausea. No headaches or change vision. No breast pain or galactorrhea.         Problem History:  Patient Active Problem List:     Allergy, unspecified not elsewhere classified     Acute gastritis without mention of hemorrhage     Intestinal infection due to Clostridium difficile     Pure hypercholesterolemia     Major  depressive disorder, recurrent episode, moderate with anxious distress (HCC)     Generalized anxiety disorder     Benign positional vertigo     Contraceptive management     Tubular adenoma of colon     GERD (gastroesophageal reflux disease)     Diarrhea     Cholecystitis     Epigastric abdominal pain     Mass of ampulla of Vater     Gastroesophageal reflux disease with esophagitis     Obesity     Colon polyp     Mucosal abnormality of duodenum     Abnormal LFTs     Allergic rhinitis due to pollen     Anisometropic amblyopia of right eye     Barrett's esophagus     Disorder of biliary tract     Gastroesophageal reflux disease     Mild intermittent asthma without complication     Prediabetes     Tinea pedis of both feet     Perimenopause     Hyperprolactinemia (HCC)      Past Medical History:   Past Medical History:   Diagnosis Date   . ACUTE GASTRITIS W/O HEMORRHAGE 06/12/2004    H. Pylori positive (done at [facility]); GI consult Dr. Phillis Knack, TOC 10/01/04   . ALLERGY, UNSPECIFIED 03/26/2004    Seen by ENT, deviated septum, hypertrophy of inferior nasal turbinates   . Anxiety    .  Asthma    . CLOSTRIDIUM DIFFICILE 07/26/2004    GI cosult: Dr. Rollene Rotunda, TOC done on 10/01/04   . Elevated cholesterol     resolved without meds   . Esophageal reflux    . Snores    . Stomach disease    . Unspecified asthma(493.90)    . Wears eyeglasses     night driving       Active Medication:   Reviewed and updated.     Allergies:   Review of Patient's Allergies indicates:   Erythromycin            Itching   Erythromycin            Itching   Macrolides and keto*    Itching, Rash    Social history:  Social History     Socioeconomic History   . Marital status: Married     Spouse name: Not on file   . Number of children: Not on file   . Years of education: Not on file   . Highest education level: Not on file   Social Needs   . Financial resource strain: Not on file   . Food insecurity - worry: Not on file   . Food insecurity - inability: Not  on file   . Transportation needs - medical: Not on file   . Transportation needs - non-medical: Not on file   Occupational History   . Not on file   Tobacco Use   . Smoking status: Never Smoker   . Smokeless tobacco: Never Used   Substance and Sexual Activity   . Alcohol use: No   . Drug use: No   . Sexual activity: Yes     Partners: Male     Comment: men age 65 q 4-6 wks x 3d, small fibroid on u/s, 1st SA age 25, 2 lifetime partners, current x 8 yrs   Other Topics Concern   . Not on file   Social History Narrative    07/09/2005    [name] family [place] age 69, husband from [place], [place]    Works [place]    Pt thrilled with pregnancy-seeking pregnancy x 4 yrs, very anxious since sab 2 yrs ago    Has one child    Not working now    Lives with husband and daughter    Alton Revere, MD, 05/13/2016, 1:50 PM   Accompanied by her sister. Unchanged.    Family History:   Family History   Problem Relation Age of Onset   . OTHER Father         leukemia    . Diabetes Mother    . Hypertension Mother    . Lipids Mother    . OTHER Mother         ESRD on HD   . Hypertension Father    . Hypertension Brother    No history of pituitary disease. Unchanged.    Review of systems:  Rest of review of systems are negative except as stated in HPI.     Physical Exam:   BP 115/74  Pulse 97  Ht 5\' 3"  (1.6 m)  Wt 127.5 kg (281 lb)  SpO2 96%  BMI 49.78 kg/m2  GEN: Appears well. Does not appear Cushingoid or acromegalic.   HEENT: EOMI, no ophthalmopathy  NECK: supple, no thyromegaly or thyroid nodules, no LAD.   NEURO: no tremor, DTR 2+ with nl relaxation  phase.   SKIN: no acanthosis nigricans.     Most Recent Weight Reading(s)  05/11/17 : 127.5 kg (281 lb)  04/01/17 : 123.4 kg (272 lb)  03/17/17 : 124.7 kg (275 lb)  02/25/17 : 124.6 kg (274 lb 9.6 oz)  02/09/17 : 126.1 kg (278 lb)    Laboratory:  Component      Latest Ref Rng & Units 02/09/2017   PROLACTIN      2.2 - 30.3 ng/mL 4.3     Component       Latest Ref Rng & Units 11/11/2016   ADRENOCORTICOTROPIC HORMONE      7.2 - 63.3 pg/mL 60.8   CORTISOL AM      4.30 - 22.40 ug/dL 13.68   FREE THYROXINE      0.76 - 1.46 ng/dL 0.78   SOMATOMEDIN C (IGF-1)      57 - 195 ng/mL 118     Component      Latest Ref Rng & Units 08/18/2016   THYROID SCREEN TSH REFLEX FT4      0.358 - 3.740 uIU/mL 2.410   PROLACTIN      2.2 - 30.3 ng/mL 732.2 (H)   FOLLICLE STIMULATING HORMONE      mIU/mL 8.0   LUTEINIZING HORMONE (LH)      mIU/ml    DHEA SULFATE      45 - 270 ug/dL    TSH (THYROID STIM HORMONE)      0.34 - 5.60 uIU/ml    ESTRADIOL      . 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    She is currently on treatment with cabergoline 0.25 mg twice a week and her last prolactin level was normal. Her periods are now coming every month. Will repeat her prolactin level. I recommend continuing her current cabergoline dose. Will adjust dose based on test result. Plan is for 2 full years of treatment and if MRI and lab tests look reassuring, can do trial off treatment.     I plan to repeat her pituitary MRI in 08/2017. Order placed. She is claustrophobic and will need an open MRI if available. I explained second option is taking ativan prior to the procedure. She prefers to find an open MRI. Will have my staff work on this.     The patient understands and agrees with the plan. All questions were answered.       Follow up in 6 months.     Richardson Landry, MD    This visit lasted 20 minutes with greater than 50% of the visit spent on counseling and coordination of care.

## 2017-05-12 ENCOUNTER — Ambulatory Visit (HOSPITAL_BASED_OUTPATIENT_CLINIC_OR_DEPARTMENT_OTHER): Payer: PRIVATE HEALTH INSURANCE

## 2017-05-12 DIAGNOSIS — F331 Major depressive disorder, recurrent, moderate: Secondary | ICD-10-CM

## 2017-05-12 MED ORDER — CABERGOLINE 0.5 MG PO TABS
0.2500 mg | ORAL_TABLET | ORAL | 3 refills | Status: DC
Start: 2017-05-12 — End: 2017-08-27

## 2017-05-12 MED ORDER — CABERGOLINE 0.5 MG PO TABS: 0 mg | tablet | ORAL | 3 refills | 0 days | Status: DC

## 2017-05-12 NOTE — Progress Notes (Signed)
OUTPATIENT PSYCHIATRY PROGRESS NOTE    INTERPRETER: No    CONTACT INFO FOR OTHER AGENCIES AND MENTAL HEALTH PROVIDERS (IF APPLICABLE): N/A    PROBLEM(S) ADDRESSED IN THIS SESSION:   - Depressed mood  - Anxiety symptoms and ruminative worries    SUBJECTIVE  TODAY'S CHIEF COMPLAINT AND CLINICAL UPDATES IN PATIENT'S WORDS:  1) Chief Complaint (Patient and/or guardian's own words, concerns and expressed thoughts): "It's going to be a stressful period"    2) New information from patient and/or collateral (Patient's illness: context, course, modifying factors, severity, cultural, family, social, medical history):   - Pt describes that her sister is officially moving in the next couple days  - Describes importance of family to her  - Describes feeling that there is more danger that she is conscious of now than before parents became ill  - Describes experience as the go-to family member for navigating health care systems    OBJECTIVE  DATA REVIEWED (Consider medical labs, radiology, other medical tests; screening/outcome measures; psychological testing; discussion of test results with other clinicians; consultation with other clinicians and systems involved with patient, summary of old records): Chart reviewed    CURRENT MEDICATIONS (make clear medications prescribed by psychiatry; include OTC medications):    Current Outpatient Medications:  omeprazole (PRILOSEC) 40 MG capsule Take 1 capsule by mouth daily Disp: 30 capsule Rfl: 2   sertraline (ZOLOFT) 25 MG tablet Take 2 tablets by mouth daily Start 25mg  daily for one week then increase to 50mg  daily Disp: 60 tablet Rfl: 1   fluticasone (FLOVENT HFA) 44 MCG/ACT inhaler Inhale 2 puffs into the lungs 2 (two) times daily Disp: 1 Inhaler Rfl: 2   albuterol (PROVENTIL) (2.5 MG/3ML) 0.083% nebulizer solution Take 3 mLs by nebulization every 6 (six) hours as needed for Wheezing Disp: 75 mL Rfl: 0   cabergoline (DOSTINEX) 0.5 MG tablet Take 0.5 tablets by mouth twice a week Disp:  12 tablet Rfl: 3   albuterol (PROVENTIL HFA,VENTOLIN HFA, PROAIR HFA) 108 (90 Base) MCG/ACT inhaler Inhale 2 puffs into the lungs every 4 (four) hours as needed for Wheezing or Shortness of breath Disp: 1 Inhaler Rfl: 1     No current facility-administered medications for this visit.     MEDICATION ADHERENCE (including barriers and how addressed): Yes  MEDICATION SIDE EFFECTS (Prescribers Only): N/A    BIRTH CONTROL (ask females and males): Deferred    CURRENT PREGNANCY: No                                    MENTAL STATUS EXAMINATION                     General Appearance: Pt appearing well groomed, dressed casually     Interaction with Interviewer (eye contact, attitude, behavior): Within normal limits    Physical Signs    Gait and Station (how patient walks and stands): Within normal limits                  Physical Appearance: Within normal limits          Normal Movements: Within normal limits         Speech (rate, volume, articulation): Within normal limits          Language: [place] first language, speaks [place] fluently  Mood: "Things feel about the same, I'm stressed"           Affect: congruent            Thought Process     Rate; concrete vs abstract reasoning: Within normal limits        Logical vs illogical; associations: loose, tangential, circumstantial, intact: Within normal limits                                    Thought Content    Normal Thought Content (other than safety): Within normal limits     Perceptions (auditory, visual, tactile, etc.): None       Impulse Control: Good         Cognition (Link to [facility])    Orientation (person, place, time): oriented x3      Recent and remote memory: Within normal limits           Attention span and concentration: Within normal limits         Fund of knowledge, awareness of current events and vocabulary: Within normal limits      Judgment: Good          Insight: Good        Suicidality/Homicidality/Aggression (Victimization or  Perpetration): None. Pt describes religion as protective factor to suicide.      ASSESSMENT   Today's Assessment:  Pt appearing dysthymic, endorsing feeling stressed. Appearing somewhat less depressed during this day. Pt is stable.    Risk Level Assessment  Risk Level Change (if yes, please describe): No    Suicide: low (1)  Violence: low (1)  Addiction: low (1)        Protective Factors: Religious faith, primary supports, housing, hard working    [place] (psychiatric diagnoses and medical diagnoses that factor into management of psychiatric treatment): Major Depressive Disorder, recurrent episode, moderate with anxious distress    CLINICAL FORMULATION (Make changes as your understanding changes. Should coincide with treatment plan): Pt is a 47yo [place] married mother of an 48yo daughter suffering from depression and anxiety symptoms. These symptoms appear to be related to health concerns, originating from the time when her daughter was born with bad acid reflux resulting in necessary hospital care of her newborn. Depressive and anxiety symptoms appear to have worsened in the context of father's multiple cancer diagnosis, and following his death approximately five years ago. Pt's mother is also now ill, and is currently in dialysis. Pt may benefit from therapy in order to process losses, and develop skills in which to manage anxiety and depression.      REVIEWING TODAY'S VISIT  CLINICAL INTERVENTIONS TODAY: Active listening, exploratory questions, validation, connecting thoughts/ feelings/ behaviors, psychoeducation about exercise/ diet/ sleep, encouraging her to talk about her grief    PATIENT'S RESPONSE TO INTERVENTIONS: Pt talkative and engaged    PROGRESS TOWARDS GOALS: Slow    TIME SPENT IN PSYCHOTHERAPY: 45 minutes    [place] (Consider risk plan for patients at moderate or high risk for suicide/violence/addiction; medication plan; referrals, etc. Must coincide with  treatment plan.): Continue to monitor for changes in risk.    PLAN FOR ONGOING TREATMENT:  Weekly psychotherapy    INFORMED CONSENT (for any new treatment): Patient was informed of the potential risks and benefits of the treatment, including the option not to treat, and appeared to understand and agreed to comply.  Discussion included the following key points: Confidentiality, frame of treatment.      ONLY FOR PRESCRIBERS DOING EVALUATION AND MANAGEMENT VISITS     Use only when no psychotherapy performed  COUNSELING AND COORDINATION OF CARE PROVIDED (Consider diagnostic results/impressions and/or recommended studies; risks and benefits of treatment options; instruction for management/treatment and/or follow-up; importance of compliance with chosen treatment options; risk factor reduction; patient/family/caregiver education; prognosis): N/A      Over 50% of time during today's visit was devoted to counseling and/or coordination of care.  If yes, record estimated duration of the face to face encounter.  N/A    INSTRUCTIONS TO COVERING CLINICIANS:  N/A        .Marland KitchenMarland KitchenMarland Kitchen

## 2017-05-12 NOTE — Addendum Note (Signed)
Addended by: Riley Lam on: 05/12/2017 08:47 PM     Modules accepted: Orders

## 2017-05-13 ENCOUNTER — Telehealth (HOSPITAL_BASED_OUTPATIENT_CLINIC_OR_DEPARTMENT_OTHER): Payer: Self-pay

## 2017-05-13 NOTE — Progress Notes (Unsigned)
Attempted to contact patient message left on VM asking patient to please return call to office, contact number provided. Letter mailed to address on file.               [place] [facility] [facility]            Please inform pt of good news that her prolactin level is even lower. We can decrease her cabergoline to half a pill a week (0.25 mg once a week).   Repeat labs in 3 months. Order placed   Thanks,   TG

## 2017-05-19 ENCOUNTER — Ambulatory Visit (HOSPITAL_BASED_OUTPATIENT_CLINIC_OR_DEPARTMENT_OTHER): Payer: PRIVATE HEALTH INSURANCE

## 2017-05-26 ENCOUNTER — Ambulatory Visit (HOSPITAL_BASED_OUTPATIENT_CLINIC_OR_DEPARTMENT_OTHER): Payer: No Typology Code available for payment source

## 2017-05-26 DIAGNOSIS — F331 Major depressive disorder, recurrent, moderate: Secondary | ICD-10-CM

## 2017-05-26 NOTE — Progress Notes (Signed)
OUTPATIENT PSYCHIATRY PROGRESS NOTE    INTERPRETER: No    CONTACT INFO FOR OTHER AGENCIES AND MENTAL HEALTH PROVIDERS (IF APPLICABLE): N/A    PROBLEM(S) ADDRESSED IN THIS SESSION:   - Depressed mood  - Anxiety symptoms and ruminative worries    SUBJECTIVE  TODAY'S CHIEF COMPLAINT AND CLINICAL UPDATES IN PATIENT'S WORDS:  1) Chief Complaint (Patient and/or guardian's own words, concerns and expressed thoughts): "I haven't been sleeping well; I'm depressed"    2) New information from patient and/or collateral (Patient's illness: context, course, modifying factors, severity, cultural, family, social, medical history):   - Pt describes that she recently ran out of her antidepressant medication. Appears more depressed on this day, and endorses increased depression, but states that she didn't think medication was working. Despite this appears more depressed on this day, and did not appear at last weeks appointment due to tiredness  - Describes difficulty with motivation, low energy  - Describes difficulty with insomnia, possibly related to depression, poor sleep hygiene    OBJECTIVE  DATA REVIEWED (Consider medical labs, radiology, other medical tests; screening/outcome measures; psychological testing; discussion of test results with other clinicians; consultation with other clinicians and systems involved with patient, summary of old records): Chart reviewed    CURRENT MEDICATIONS (make clear medications prescribed by psychiatry; include OTC medications):    Current Outpatient Medications:  cabergoline (DOSTINEX) 0.5 MG tablet Take 0.5 tablets by mouth every 7 days Disp: 12 tablet Rfl: 3   omeprazole (PRILOSEC) 40 MG capsule Take 1 capsule by mouth daily Disp: 30 capsule Rfl: 2   sertraline (ZOLOFT) 25 MG tablet Take 2 tablets by mouth daily Start 25mg  daily for one week then increase to 50mg  daily Disp: 60 tablet Rfl: 1   fluticasone (FLOVENT HFA) 44 MCG/ACT inhaler Inhale 2 puffs into the lungs 2 (two) times daily  Disp: 1 Inhaler Rfl: 2   albuterol (PROVENTIL) (2.5 MG/3ML) 0.083% nebulizer solution Take 3 mLs by nebulization every 6 (six) hours as needed for Wheezing Disp: 75 mL Rfl: 0   albuterol (PROVENTIL HFA,VENTOLIN HFA, PROAIR HFA) 108 (90 Base) MCG/ACT inhaler Inhale 2 puffs into the lungs every 4 (four) hours as needed for Wheezing or Shortness of breath Disp: 1 Inhaler Rfl: 1     No current facility-administered medications for this visit.     MEDICATION ADHERENCE (including barriers and how addressed): Yes  MEDICATION SIDE EFFECTS (Prescribers Only): N/A    BIRTH CONTROL (ask females and males): Deferred    CURRENT PREGNANCY: No                                    MENTAL STATUS EXAMINATION                     General Appearance: Pt appearing well groomed, dressed casually     Interaction with Interviewer (eye contact, attitude, behavior): Within normal limits    Physical Signs    Gait and Station (how patient walks and stands): Within normal limits                  Physical Appearance: Within normal limits          Normal Movements: Within normal limits         Speech (rate, volume, articulation): Within normal limits          Language: [place] first language, speaks [place] fluently  Mood: "I'm just so depressed"           Affect: congruent            Thought Process     Rate; concrete vs abstract reasoning: Within normal limits        Logical vs illogical; associations: loose, tangential, circumstantial, intact: Within normal limits                                    Thought Content    Normal Thought Content (other than safety): Within normal limits     Perceptions (auditory, visual, tactile, etc.): None       Impulse Control: Good         Cognition (Link to [facility])    Orientation (person, place, time): oriented x3      Recent and remote memory: Within normal limits           Attention span and concentration: Within normal limits         Fund of knowledge, awareness of current events and  vocabulary: Within normal limits      Judgment: Good          Insight: Good        Suicidality/Homicidality/Aggression (Victimization or Perpetration): None. Pt describes religion as protective factor to suicide.      ASSESSMENT   Today's Assessment:  Pt appearing depressed. Endorses poor sleep, increased depression, lack of energy, seeming to correlate with stopping antidepressant medication. Pt is stable.    Risk Level Assessment  Risk Level Change (if yes, please describe): No    Suicide: low (1)  Violence: low (1)  Addiction: low (1)        Protective Factors: Religious faith, primary supports, housing, hard working    [place] (psychiatric diagnoses and medical diagnoses that factor into management of psychiatric treatment): Major Depressive Disorder, recurrent episode, moderate with anxious distress    CLINICAL FORMULATION (Make changes as your understanding changes. Should coincide with treatment plan): Pt is a 47yo [place] married mother of an 37yo daughter suffering from depression and anxiety symptoms. These symptoms appear to be related to health concerns, originating from the time when her daughter was born with bad acid reflux resulting in necessary hospital care of her newborn. Depressive and anxiety symptoms appear to have worsened in the context of father's multiple cancer diagnosis, and following his death approximately five years ago. Pt's mother is also now ill, and is currently in dialysis. Pt may benefit from therapy in order to process losses, and develop skills in which to manage anxiety and depression.      REVIEWING TODAY'S VISIT  CLINICAL INTERVENTIONS TODAY: Active listening, exploratory questions, validation, connecting thoughts/ feelings/ behaviors, psychoeducation about exercise/ diet/ sleep, encouraging her to talk about her grief, sleep hygiene, psychoeducation about psychopharm    PATIENT'S RESPONSE TO INTERVENTIONS: Pt talkative and engaged    PROGRESS TOWARDS  GOALS: Slow    TIME SPENT IN PSYCHOTHERAPY: 45 minutes    [place] (Consider risk plan for patients at moderate or high risk for suicide/violence/addiction; medication plan; referrals, etc. Must coincide with treatment plan.): Continue to monitor for changes in risk.    PLAN FOR ONGOING TREATMENT:  Weekly psychotherapy    INFORMED CONSENT (for any new treatment): Patient was informed of the potential risks and benefits of the treatment, including the option not to treat, and  appeared to understand and agreed to comply. Discussion included the following key points: Confidentiality, frame of treatment.      ONLY FOR PRESCRIBERS DOING EVALUATION AND MANAGEMENT VISITS     Use only when no psychotherapy performed  COUNSELING AND COORDINATION OF CARE PROVIDED (Consider diagnostic results/impressions and/or recommended studies; risks and benefits of treatment options; instruction for management/treatment and/or follow-up; importance of compliance with chosen treatment options; risk factor reduction; patient/family/caregiver education; prognosis): N/A      Over 50% of time during today's visit was devoted to counseling and/or coordination of care.  If yes, record estimated duration of the face to face encounter.  N/A    INSTRUCTIONS TO COVERING CLINICIANS:  N/A        ...Marland KitchenMarland Kitchen

## 2017-06-03 ENCOUNTER — Ambulatory Visit (HOSPITAL_BASED_OUTPATIENT_CLINIC_OR_DEPARTMENT_OTHER): Payer: No Typology Code available for payment source

## 2017-06-03 DIAGNOSIS — F331 Major depressive disorder, recurrent, moderate: Secondary | ICD-10-CM

## 2017-06-04 NOTE — Progress Notes (Signed)
OUTPATIENT PSYCHIATRY PROGRESS NOTE    INTERPRETER: No    CONTACT INFO FOR OTHER AGENCIES AND MENTAL HEALTH PROVIDERS (IF APPLICABLE): N/A    PROBLEM(S) ADDRESSED IN THIS SESSION:   - Depressed mood  - Anxiety symptoms and ruminative worries    SUBJECTIVE  TODAY'S CHIEF COMPLAINT AND CLINICAL UPDATES IN PATIENT'S WORDS:  1) Chief Complaint (Patient and/or guardian's own words, concerns and expressed thoughts): "I've just been so anxious about my daughters school"    2) New information from patient and/or collateral (Patient's illness: context, course, modifying factors, severity, cultural, family, social, medical history):   - Pt describes anxiety related to driving, particularly when in traffic  - Describes fears related to her daughter being placed at a middle school which would involve a longer commute in traffic. Describes feeling that she wouldn't be able to manage anxiety  - Describes worry about her daughter going to new school  - Asks for me to write a letter stating that she has anxiety related to being in traffic    OBJECTIVE  DATA REVIEWED (Consider medical labs, radiology, other medical tests; screening/outcome measures; psychological testing; discussion of test results with other clinicians; consultation with other clinicians and systems involved with patient, summary of old records): Chart reviewed    CURRENT MEDICATIONS (make clear medications prescribed by psychiatry; include OTC medications):    Current Outpatient Medications:  cabergoline (DOSTINEX) 0.5 MG tablet Take 0.5 tablets by mouth every 7 days Disp: 12 tablet Rfl: 3   omeprazole (PRILOSEC) 40 MG capsule Take 1 capsule by mouth daily Disp: 30 capsule Rfl: 2   sertraline (ZOLOFT) 25 MG tablet Take 2 tablets by mouth daily Start 25mg  daily for one week then increase to 50mg  daily Disp: 60 tablet Rfl: 1   fluticasone (FLOVENT HFA) 44 MCG/ACT inhaler Inhale 2 puffs into the lungs 2 (two) times daily Disp: 1 Inhaler Rfl: 2   albuterol (PROVENTIL)  (2.5 MG/3ML) 0.083% nebulizer solution Take 3 mLs by nebulization every 6 (six) hours as needed for Wheezing Disp: 75 mL Rfl: 0   albuterol (PROVENTIL HFA,VENTOLIN HFA, PROAIR HFA) 108 (90 Base) MCG/ACT inhaler Inhale 2 puffs into the lungs every 4 (four) hours as needed for Wheezing or Shortness of breath Disp: 1 Inhaler Rfl: 1     No current facility-administered medications for this visit.     MEDICATION ADHERENCE (including barriers and how addressed): Yes  MEDICATION SIDE EFFECTS (Prescribers Only): N/A    BIRTH CONTROL (ask females and males): Deferred    CURRENT PREGNANCY: No                                    MENTAL STATUS EXAMINATION                     General Appearance: Pt appearing well groomed, dressed casually     Interaction with Interviewer (eye contact, attitude, behavior): Within normal limits    Physical Signs    Gait and Station (how patient walks and stands): Within normal limits                  Physical Appearance: Within normal limits          Normal Movements: Within normal limits         Speech (rate, volume, articulation): Within normal limits          Language: [place] first language, speaks  English fluently                       Mood: "I'm just so worried about this"           Affect: Distressed           Thought Process     Rate; concrete vs abstract reasoning: Within normal limits        Logical vs illogical; associations: loose, tangential, circumstantial, intact: Within normal limits                                    Thought Content    Normal Thought Content (other than safety): Within normal limits     Perceptions (auditory, visual, tactile, etc.): None       Impulse Control: Good         Cognition (Link to [facility])    Orientation (person, place, time): oriented x3      Recent and remote memory: Within normal limits           Attention span and concentration: Within normal limits         Fund of knowledge, awareness of current events and vocabulary: Within normal  limits      Judgment: Good          Insight: Good        Suicidality/Homicidality/Aggression (Victimization or Perpetration): None. Pt describes religion as protective factor to suicide.      ASSESSMENT   Today's Assessment:  Pt appearing distressed as she describes daughter being placed in a new school that is not ideal. Pt is stable.    Risk Level Assessment  Risk Level Change (if yes, please describe): No    Suicide: low (1)  Violence: low (1)  Addiction: low (1)        Protective Factors: Religious faith, primary supports, housing, hard working    [place] (psychiatric diagnoses and medical diagnoses that factor into management of psychiatric treatment): Major Depressive Disorder, recurrent episode, moderate with anxious distress    CLINICAL FORMULATION (Make changes as your understanding changes. Should coincide with treatment plan): Pt is a 47yo [place] married mother of an 54yo daughter suffering from depression and anxiety symptoms. These symptoms appear to be related to health concerns, originating from the time when her daughter was born with bad acid reflux resulting in necessary hospital care of her newborn. Depressive and anxiety symptoms appear to have worsened in the context of father's multiple cancer diagnosis, and following his death approximately five years ago. Pt's mother is also now ill, and is currently in dialysis. Pt may benefit from therapy in order to process losses, and develop skills in which to manage anxiety and depression.      REVIEWING TODAY'S VISIT  CLINICAL INTERVENTIONS TODAY: Active listening, exploratory questions, validation, connecting thoughts/ feelings/ behaviors, psychoeducation about exercise/ diet/ sleep, encouraging her to talk about her grief, sleep hygiene, psychoeducation about psychopharm, writing a letter, practicing breathing excercises    PATIENT'S RESPONSE TO INTERVENTIONS: Pt talkative and engaged    PROGRESS TOWARDS GOALS: Slow    TIME SPENT  IN PSYCHOTHERAPY: 45 minutes    [place] (Consider risk plan for patients at moderate or high risk for suicide/violence/addiction; medication plan; referrals, etc. Must coincide with treatment plan.): Continue to monitor for changes in risk.    PLAN FOR ONGOING TREATMENT:  Weekly psychotherapy    INFORMED CONSENT (for any new treatment): Patient was informed of the potential risks and benefits of the treatment, including the option not to treat, and appeared to understand and agreed to comply. Discussion included the following key points: Confidentiality, frame of treatment.      ONLY FOR PRESCRIBERS DOING EVALUATION AND MANAGEMENT VISITS     Use only when no psychotherapy performed  COUNSELING AND COORDINATION OF CARE PROVIDED (Consider diagnostic results/impressions and/or recommended studies; risks and benefits of treatment options; instruction for management/treatment and/or follow-up; importance of compliance with chosen treatment options; risk factor reduction; patient/family/caregiver education; prognosis): N/A      Over 50% of time during today's visit was devoted to counseling and/or coordination of care.  If yes, record estimated duration of the face to face encounter.  N/A    INSTRUCTIONS TO COVERING CLINICIANS:  N/A        ...Marland KitchenMarland KitchenMarland Kitchen

## 2017-06-09 ENCOUNTER — Ambulatory Visit (HOSPITAL_BASED_OUTPATIENT_CLINIC_OR_DEPARTMENT_OTHER): Payer: No Typology Code available for payment source

## 2017-06-09 DIAGNOSIS — F331 Major depressive disorder, recurrent, moderate: Secondary | ICD-10-CM

## 2017-06-09 NOTE — Progress Notes (Signed)
OUTPATIENT PSYCHIATRY PROGRESS NOTE    INTERPRETER: No    CONTACT INFO FOR OTHER AGENCIES AND MENTAL HEALTH PROVIDERS (IF APPLICABLE): N/A    PROBLEM(S) ADDRESSED IN THIS SESSION:   - Depressed mood  - Anxiety symptoms and ruminative worries    SUBJECTIVE  TODAY'S CHIEF COMPLAINT AND CLINICAL UPDATES IN PATIENT'S WORDS:  1) Chief Complaint (Patient and/or guardian's own words, concerns and expressed thoughts): "I just know my anxiety is going to get worse"    2) New information from patient and/or collateral (Patient's illness: context, course, modifying factors, severity, cultural, family, social, medical history):   - Alejandra Martin describes continued increase in worries and anxiety related to her daughter's new school, which she would have to drive across town in traffic to drop her off at  [employer] difficulties in mobility, difficult to manage anxiety when in a car  - Describes worry that her daughter will notice her anxiety    OBJECTIVE  DATA REVIEWED (Consider medical labs, radiology, other medical tests; screening/outcome measures; psychological testing; discussion of test results with other clinicians; consultation with other clinicians and systems involved with patient, summary of old records): Chart reviewed    CURRENT MEDICATIONS (make clear medications prescribed by psychiatry; include OTC medications):  Current Outpatient Medications   Medication Sig    cabergoline (DOSTINEX) 0.5 MG tablet Take 0.5 tablets by mouth every 7 days    omeprazole (PRILOSEC) 40 MG capsule Take 1 capsule by mouth daily    sertraline (ZOLOFT) 25 MG tablet Take 2 tablets by mouth daily Start 25mg  daily for one week then increase to 50mg  daily    fluticasone (FLOVENT HFA) 44 MCG/ACT inhaler Inhale 2 puffs into the lungs 2 (two) times daily    albuterol (PROVENTIL) (2.5 MG/3ML) 0.083% nebulizer solution Take 3 mLs by nebulization every 6 (six) hours as needed for Wheezing    albuterol (PROVENTIL HFA,VENTOLIN HFA, PROAIR HFA) 108 (90  Base) MCG/ACT inhaler Inhale 2 puffs into the lungs every 4 (four) hours as needed for Wheezing or Shortness of breath     No current facility-administered medications for this visit.        MEDICATION ADHERENCE (including barriers and how addressed): Yes  MEDICATION SIDE EFFECTS (Prescribers Only): N/A    BIRTH CONTROL (ask females and males): Deferred    CURRENT PREGNANCY: No                                    MENTAL STATUS EXAMINATION                     General Appearance: Alejandra Martin appearing well groomed, dressed casually     Interaction with Interviewer (eye contact, attitude, behavior): Within normal limits    Physical Signs    Gait and Station (how patient walks and stands): Within normal limits                  Physical Appearance: Within normal limits          Normal Movements: Within normal limits         Speech (rate, volume, articulation): Within normal limits          Language: [place] first language, speaks [place] fluently                       Mood: "I don't like this at all"  Affect: Distressed           Thought Process     Rate; concrete vs abstract reasoning: Within normal limits        Logical vs illogical; associations: loose, tangential, circumstantial, intact: Within normal limits                                    Thought Content    Normal Thought Content (other than safety): Within normal limits     Perceptions (auditory, visual, tactile, etc.): None       Impulse Control: Good         Cognition (Link to [facility])    Orientation (person, place, time): oriented x3      Recent and remote memory: Within normal limits           Attention span and concentration: Within normal limits         Fund of knowledge, awareness of current events and vocabulary: Within normal limits      Judgment: Good          Insight: Good        Suicidality/Homicidality/Aggression (Victimization or Perpetration): None. Alejandra Martin describes religion as protective factor to suicide.      ASSESSMENT   Today's Assessment:  Alejandra Martin  appearing anxious and describing distress as she describes daughter being placed in a new school that is not ideal. Alejandra Martin is stable.    Risk Level Assessment  Risk Level Change (if yes, please describe): No    Suicide: low (1)  Violence: low (1)  Addiction: low (1)        Protective Factors: Religious faith, primary supports, housing, hard working    [place] (psychiatric diagnoses and medical diagnoses that factor into management of psychiatric treatment): Major Depressive Disorder, recurrent episode, moderate with anxious distress    CLINICAL FORMULATION (Make changes as your understanding changes. Should coincide with treatment plan): Alejandra Martin is a 47yo [place] married mother of an 46yo daughter suffering from depression and anxiety symptoms. These symptoms appear to be related to health concerns, originating from the time when her daughter was born with bad acid reflux resulting in necessary hospital care of her newborn. Depressive and anxiety symptoms appear to have worsened in the context of father's multiple cancer diagnosis, and following his death approximately five years ago. Alejandra Martin's mother is also now ill, and is currently in dialysis. Alejandra Martin may benefit from therapy in order to process losses, and develop skills in which to manage anxiety and depression.      REVIEWING TODAY'S VISIT  CLINICAL INTERVENTIONS TODAY: Active listening, exploratory questions, validation, connecting thoughts/ feelings/ behaviors, psychoeducation about exercise/ diet/ sleep, encouraging her to talk about her grief, sleep hygiene, Identifying unhelpful thought patterns exercise    PATIENT'S RESPONSE TO INTERVENTIONS: Alejandra Martin talkative and engaged    PROGRESS TOWARDS GOALS: Slow    TIME SPENT IN PSYCHOTHERAPY: 45 minutes    [place] (Consider risk plan for patients at moderate or high risk for suicide/violence/addiction; medication plan; referrals, etc. Must coincide with treatment plan.): Continue to monitor for  changes in risk.    PLAN FOR ONGOING TREATMENT:  Weekly psychotherapy    INFORMED CONSENT (for any new treatment): Patient was informed of the potential risks and benefits of the treatment, including the option not to treat, and appeared to understand and agreed to comply. Discussion included the following key  points: Confidentiality, frame of treatment.      ONLY FOR PRESCRIBERS DOING EVALUATION AND MANAGEMENT VISITS     Use only when no psychotherapy performed  COUNSELING AND COORDINATION OF CARE PROVIDED (Consider diagnostic results/impressions and/or recommended studies; risks and benefits of treatment options; instruction for management/treatment and/or follow-up; importance of compliance with chosen treatment options; risk factor reduction; patient/family/caregiver education; prognosis): N/A      Over 50% of time during today's visit was devoted to counseling and/or coordination of care.  If yes, record estimated duration of the face to face encounter.  N/A    INSTRUCTIONS TO COVERING CLINICIANS:  N/A        .....Marland KitchenMarland Kitchen

## 2017-06-12 ENCOUNTER — Other Ambulatory Visit (HOSPITAL_BASED_OUTPATIENT_CLINIC_OR_DEPARTMENT_OTHER): Payer: Self-pay | Admitting: Physician Assistant

## 2017-06-12 ENCOUNTER — Ambulatory Visit (HOSPITAL_BASED_OUTPATIENT_CLINIC_OR_DEPARTMENT_OTHER): Payer: PRIVATE HEALTH INSURANCE | Admitting: Internal Medicine

## 2017-06-12 DIAGNOSIS — J301 Allergic rhinitis due to pollen: Secondary | ICD-10-CM

## 2017-06-12 DIAGNOSIS — T7840XD Allergy, unspecified, subsequent encounter: Secondary | ICD-10-CM

## 2017-06-12 NOTE — Progress Notes (Signed)
PER Pharmacy, Alejandra Martin is a 47 year old female has requested a refill of loratadine 10mg .      Last Office Visit: 03/17/17 with PCP  Last Physical Exam: 11/16/03 (N/S)    There are no preventive care reminders to display for this patient.    Other Med Adult:  Most Recent BP Reading(s)  05/11/17 : 115/74        Cholesterol (mg/dL)   Date Value   05/14/2016 256 (*H)     LOW DENSITY LIPOPROTEIN DIRECT (mg/dL)   Date Value   05/14/2016 179     HIGH DENSITY LIPOPROTEIN (mg/dL)   Date Value   05/14/2016 42     TRIGLYCERIDES (mg/dL)   Date Value   05/14/2016 132         THYROID SCREEN TSH REFLEX FT4 (uIU/mL)   Date Value   08/18/2016 2.410         TSH (THYROID STIM HORMONE) (uIU/ml)   Date Value   06/28/2004 1.41       HEMOGLOBIN A1C (%)   Date Value   05/14/2016 5.5       No results found for: POCA1C      INR (no units)   Date Value   04/28/2008 1.0 (L)   09/24/2006 1.0 (L)       SODIUM (mmol/L)   Date Value   02/24/2017 137       POTASSIUM (mmol/L)   Date Value   02/25/2017 4.1           CREATININE (mg/dL)   Date Value   02/24/2017 0.6       Documented patient preferred pharmacies:    [facility], [place] - [address] AT [facility] OF [address]  Phone: [phone] Fax: [phone]

## 2017-06-16 ENCOUNTER — Ambulatory Visit (HOSPITAL_BASED_OUTPATIENT_CLINIC_OR_DEPARTMENT_OTHER): Payer: No Typology Code available for payment source

## 2017-06-16 DIAGNOSIS — F331 Major depressive disorder, recurrent, moderate: Secondary | ICD-10-CM

## 2017-06-16 NOTE — Addendum Note (Signed)
Addended by: Luella Cook on: 06/16/2017 11:20 AM     Modules accepted: Orders

## 2017-06-16 NOTE — Progress Notes (Addendum)
OUTPATIENT PSYCHIATRY PROGRESS NOTE    INTERPRETER: No    CONTACT INFO FOR OTHER AGENCIES AND MENTAL HEALTH PROVIDERS (IF APPLICABLE): N/A    PROBLEM(S) ADDRESSED IN THIS SESSION:   - Depressed mood  - Anxiety symptoms and ruminative worries    SUBJECTIVE  TODAY'S CHIEF COMPLAINT AND CLINICAL UPDATES IN PATIENT'S WORDS:  1) Chief Complaint (Patient and/or guardian's own words, concerns and expressed thoughts): "I don't know if this is going to get better"    2) New information from patient and/or collateral (Patient's illness: context, course, modifying factors, severity, cultural, family, social, medical history):   - Pt describes feelings of hopelessness related to her depression and anxiety symptoms  - Describes lack of energy  - Describes anhedonia, hypersomnia  - Ran out of anti-depressant medication, missed last appt with Dr to receive refill  - Wants to apply for disability due to longstanding inability to pursue work opportunities and normal functioning due to depression and anxiety symptoms    OBJECTIVE  DATA REVIEWED (Consider medical labs, radiology, other medical tests; screening/outcome measures; psychological testing; discussion of test results with other clinicians; consultation with other clinicians and systems involved with patient, summary of old records): Chart reviewed    CURRENT MEDICATIONS (make clear medications prescribed by psychiatry; include OTC medications):  Current Outpatient Medications   Medication Sig    loratadine (CLARITIN) 10 MG tablet Take 1 tablet by mouth daily    cabergoline (DOSTINEX) 0.5 MG tablet Take 0.5 tablets by mouth every 7 days    omeprazole (PRILOSEC) 40 MG capsule Take 1 capsule by mouth daily    sertraline (ZOLOFT) 25 MG tablet Take 2 tablets by mouth daily Start 25mg  daily for one week then increase to 50mg  daily    fluticasone (FLOVENT HFA) 44 MCG/ACT inhaler Inhale 2 puffs into the lungs 2 (two) times daily    albuterol (PROVENTIL) (2.5 MG/3ML) 0.083%  nebulizer solution Take 3 mLs by nebulization every 6 (six) hours as needed for Wheezing    albuterol (PROVENTIL HFA,VENTOLIN HFA, PROAIR HFA) 108 (90 Base) MCG/ACT inhaler Inhale 2 puffs into the lungs every 4 (four) hours as needed for Wheezing or Shortness of breath     No current facility-administered medications for this visit.        MEDICATION ADHERENCE (including barriers and how addressed): Yes  MEDICATION SIDE EFFECTS (Prescribers Only): N/A    BIRTH CONTROL (ask females and males): Deferred    CURRENT PREGNANCY: No                                    MENTAL STATUS EXAMINATION                     General Appearance: Pt appearing well groomed, dressed casually     Interaction with Interviewer (eye contact, attitude, behavior): Within normal limits    Physical Signs    Gait and Station (how patient walks and stands): Within normal limits                  Physical Appearance: Within normal limits          Normal Movements: Within normal limits         Speech (rate, volume, articulation): Within normal limits          Language: [place] first language, speaks [place] fluently  Mood: "I am just so tired"           Affect: Depressed          Thought Process     Rate; concrete vs abstract reasoning: Within normal limits        Logical vs illogical; associations: loose, tangential, circumstantial, intact: Within normal limits                                    Thought Content    Normal Thought Content (other than safety): Within normal limits     Perceptions (auditory, visual, tactile, etc.): None       Impulse Control: Good         Cognition (Link to [facility])    Orientation (person, place, time): oriented x3      Recent and remote memory: Within normal limits           Attention span and concentration: Within normal limits         Fund of knowledge, awareness of current events and vocabulary: Within normal limits      Judgment: Good          Insight:  Good        Suicidality/Homicidality/Aggression (Victimization or Perpetration): None. Pt describes religion as protective factor to suicide.      ASSESSMENT   Today's Assessment:  Pt depressed, expressing distress about daughter's school plan for middle school. Pt is stable.    Risk Level Assessment  Risk Level Change (if yes, please describe): No    Suicide: low (1)  Violence: low (1)  Addiction: low (1)        Protective Factors: Religious faith, primary supports, housing, hard working    [place] (psychiatric diagnoses and medical diagnoses that factor into management of psychiatric treatment): Major Depressive Disorder, recurrent episode, moderate with anxious distress    CLINICAL FORMULATION (Make changes as your understanding changes. Should coincide with treatment plan): Pt is a 47yo [place] married mother of an 82yo daughter suffering from depression and anxiety symptoms. These symptoms appear to be related to health concerns, originating from the time when her daughter was born with bad acid reflux resulting in necessary hospital care of her newborn. Depressive and anxiety symptoms appear to have worsened in the context of father's multiple cancer diagnosis, and following his death approximately five years ago. Pt's mother is also now ill, and is currently in dialysis. Pt may benefit from therapy in order to process losses, and develop skills in which to manage anxiety and depression.      REVIEWING TODAY'S VISIT  CLINICAL INTERVENTIONS TODAY: Active listening, exploratory questions, validation, connecting thoughts/ feelings/ behaviors, psychoeducation about exercise/ diet/ sleep, encouraging her to talk about her grief, sleep hygiene, Identifying unhelpful thought patterns exercise    PATIENT'S RESPONSE TO INTERVENTIONS: Pt talkative and engaged    PROGRESS TOWARDS GOALS: Slow    TIME SPENT IN PSYCHOTHERAPY: 45 minutes    [place] (Consider risk plan for patients at  moderate or high risk for suicide/violence/addiction; medication plan; referrals, etc. Must coincide with treatment plan.): Continue to monitor for changes in risk.    PLAN FOR ONGOING TREATMENT:  Weekly psychotherapy    INFORMED CONSENT (for any new treatment): Patient was informed of the potential risks and benefits of the treatment, including the option not to treat, and appeared to understand and agreed to  comply. Discussion included the following key points: Confidentiality, frame of treatment.      ONLY FOR PRESCRIBERS DOING EVALUATION AND MANAGEMENT VISITS     Use only when no psychotherapy performed  COUNSELING AND COORDINATION OF CARE PROVIDED (Consider diagnostic results/impressions and/or recommended studies; risks and benefits of treatment options; instruction for management/treatment and/or follow-up; importance of compliance with chosen treatment options; risk factor reduction; patient/family/caregiver education; prognosis): N/A      Over 50% of time during today's visit was devoted to counseling and/or coordination of care.  If yes, record estimated duration of the face to face encounter.  N/A    INSTRUCTIONS TO COVERING CLINICIANS:  N/A        .......Marland Kitchen

## 2017-06-17 NOTE — Progress Notes (Deleted)
Alejandra Martin is a 47 year old female patient of Dr. Garth Bigness who presents today with anxiety.      Is she taking sertraline?  Following with psychiatry    Anxiety, depression - seen in ED on 02/24/17 and in the office on 02/25/17 - symptoms have responded to anxiety rx, started on hydroxyzine which she has been taking BID with good effect, has been on SSRI's in past and is amenable to starting again today, no SI/HI, had good relief from therapy in past and would like referral again today  - Referral placed to psychiatry  - Sertraline as per orders - medication use and SE discussed at length  - Continue hydroxyzine up to TID prn until starting sertraline  - Taking cabergoline for elevated prolactin which may interact with sertraline, discussed with pharmacist who advised it could be started but pt will call if any SE develop      ***    Additional ROS:  All others reviewed and negative.    Past Medical History:  06/12/2004: ACUTE GASTRITIS W/O HEMORRHAGE      Comment:  H. Pylori positive (done at [facility]); GI consult Dr.               Phillis Knack, TOC 10/01/04  03/26/2004: ALLERGY, UNSPECIFIED      Comment:  Seen by ENT, deviated septum, hypertrophy of inferior                nasal turbinates  No date: Anxiety  No date: Asthma  07/26/2004: CLOSTRIDIUM DIFFICILE      Comment:  GI cosult: Dr. Rollene Rotunda, TOC done on 10/01/04  No date: Elevated cholesterol      Comment:  resolved without meds  No date: Esophageal reflux  No date: Snores  No date: Stomach disease  No date: Unspecified asthma(493.90)  No date: Wears eyeglasses      Comment:  night driving  Past Surgical History:  No date: CHOLECYSTECTOMY  No date: NO SIGNIFICANT SURGICAL HISTORY  No date: OB ANTEPARTUM CARE CESAREAN DLVR & POSTPARTUM  Review of patient's family history indicates:  Problem: OTHER      Relation: Father          Age of Onset: (Not Specified)          Comment: leukemia   Problem: Diabetes      Relation: Mother          Age of Onset: (Not  Specified)  Problem: Hypertension      Relation: Mother          Age of Onset: (Not Specified)  Problem: Lipids      Relation: Mother          Age of Onset: (Not Specified)  Problem: OTHER      Relation: Mother          Age of Onset: (Not Specified)          Comment: ESRD on HD  Problem: Hypertension      Relation: Father          Age of Onset: (Not Specified)  Problem: Hypertension      Relation: Brother          Age of Onset: (Not Specified)    Social History     Socioeconomic History    Marital status: Married     Spouse name: Not on file    Number of children: Not on file    Years of education: Not on file  Highest education level: Not on file   Occupational History    Not on file   Social Needs    Financial resource strain: Not on file    Food insecurity:     Worry: Not on file     Inability: Not on file    Transportation needs:     Medical: Not on file     Non-medical: Not on file   Tobacco Use    Smoking status: Never Smoker    Smokeless tobacco: Never Used   Substance and Sexual Activity    Alcohol use: No    Drug use: No    Sexual activity: Yes     Partners: Male     Comment: men age 71 q 4-6 wks x 3d, small fibroid on u/s, 1st SA age 76, 2 lifetime partners, current x 8 yrs   Lifestyle    Physical activity:     Days per week: Not on file     Minutes per session: Not on file    Stress: Not on file   Relationships    Social connections:     Talks on phone: Not on file     Gets together: Not on file     Attends religious service: Not on file     Active member of club or organization: Not on file     Attends meetings of clubs or organizations: Not on file     Relationship status: Not on file    Intimate partner violence:     Fear of current or ex partner: Not on file     Emotionally abused: Not on file     Physically abused: Not on file     Forced sexual activity: Not on file   Other Topics Concern    Not on file   Social History Narrative    07/09/2005    [name] family [place] age 61, husband from [place]    Works [employer]    Pt thrilled with pregnancy-seeking pregnancy x 4 yrs, very anxious since sab 2 yrs ago    Has one child    Not working now    Lives with husband and daughter    Alton [place], MD, 05/13/2016, 1:50 PM     Immunization History   Administered Date(s) Administered    HEP B ADULT 3 DOSE 20 and > 04/05/2004, 05/03/2004, 10/07/2004    INFLUENZA VIRUS VAC QUAD LIVE INTRANASAL 2-<64YRS 12/27/2008, 01/02/2010, 11/25/2012    Influenza Virus Quad Presv Free Vacc 3/> Yrs IM 03/17/2017    Influenza Virus Tri Presv Free 3/> YRS IM 01/20/2006    Pneumococcal,NOS 01/02/2010    Td 04/03/2004, 02/21/2009    Tdap (adacel) 08/05/2012     Current Outpatient Medications   Medication Sig    loratadine (CLARITIN) 10 MG tablet Take 1 tablet by mouth daily    cabergoline (DOSTINEX) 0.5 MG tablet Take 0.5 tablets by mouth every 7 days    omeprazole (PRILOSEC) 40 MG capsule Take 1 capsule by mouth daily    sertraline (ZOLOFT) 25 MG tablet Take 2 tablets by mouth daily Start 25mg  daily for one week then increase to 50mg  daily    fluticasone (FLOVENT HFA) 44 MCG/ACT inhaler Inhale 2 puffs into the lungs 2 (two) times daily    albuterol (PROVENTIL) (2.5 MG/3ML) 0.083% nebulizer solution Take 3 mLs by nebulization every 6 (six) hours as needed for Wheezing  albuterol (PROVENTIL HFA,VENTOLIN HFA, PROAIR HFA) 108 (90 Base) MCG/ACT inhaler Inhale 2 puffs into the lungs every 4 (four) hours as needed for Wheezing or Shortness of breath     No current facility-administered medications for this visit.        Physical exam:      Constitutional: Not in acute distress.  HEENT: Normal conjunctiva and intact EOM. PERRLA. Nasal turbinates are normal. Oropharynx is clear and moist. No oropharyngeal exudate. Intact TM.  Neck: Supple. No lymphadenopathy.    Cardiovascular: Regular rate, rhythm, normal S1/ S2 heart sounds. No gallop, friction rub, or  murmur. No edema.    Pulmonary/Chest: Effort normal and breath sounds normal. No respiratory distress. No tenderness. No wheezes, rales.  Abdominal: Soft. Bowel sounds are normal. No distension or mass. There is no rebound or guarding. No tenderness.  Musculoskeletal: Normal range of motion. No redness, warmth, deformity,   No tenderness.   Neurological:  Alert and oriented. Grossly intact. Normal reflexes. Normal muscle tone. Coordination and gait are normal.  No cranial nerve deficit.    Psychiatric: Normal mood and affect. Thought content appears to be intact.   Skin: Skin is dry and intact. No rash, erythema or pallor noted.      Assessment and Plan:  No diagnosis found.     We discussed the patients current medications. The patient expressed understanding and no barriers to adherence were identified.   1. The patient indicates understanding of these issues and agrees with the plan. Brief care plan is updated and reviewed with the patient.   2. The patient is given an After Visit Summary sheet that lists all medications with directions, allergies, orders placed during this encounter, and follow-up instructions.   3. I reviewed the patient's medical information and medical history.   4. I reconciled the patient's medication list and prepared and supplied needed refills.   5. I have reviewed the past medical, family, and social history sections including the medications and allergies    Verlene Mayer, PA-C  [facility]

## 2017-06-22 ENCOUNTER — Telehealth (HOSPITAL_BASED_OUTPATIENT_CLINIC_OR_DEPARTMENT_OTHER): Payer: Self-pay

## 2017-06-22 ENCOUNTER — Ambulatory Visit (HOSPITAL_BASED_OUTPATIENT_CLINIC_OR_DEPARTMENT_OTHER): Payer: PRIVATE HEALTH INSURANCE | Admitting: Gastroenterology

## 2017-06-22 VITALS — BP 115/82 | HR 90 | Temp 97.9°F | Resp 14 | Ht 63.0 in | Wt 286.0 lb

## 2017-06-22 DIAGNOSIS — Z86010 Personal history of colonic polyps: Secondary | ICD-10-CM

## 2017-06-22 DIAGNOSIS — R7989 Other specified abnormal findings of blood chemistry: Secondary | ICD-10-CM

## 2017-06-22 DIAGNOSIS — K219 Gastro-esophageal reflux disease without esophagitis: Secondary | ICD-10-CM

## 2017-06-22 DIAGNOSIS — R945 Abnormal results of liver function studies: Secondary | ICD-10-CM

## 2017-06-22 DIAGNOSIS — K22719 Barrett's esophagus with dysplasia, unspecified: Secondary | ICD-10-CM

## 2017-06-22 LAB — COMPREHENSIVE METABOLIC PANEL
ALANINE AMINOTRANSFERASE: 21 U/L (ref 12–45)
ALBUMIN: 3.4 g/dL (ref 3.4–5.0)
ALKALINE PHOSPHATASE: 99 U/L (ref 45–117)
ANION GAP: 9 mmol/L (ref 5–15)
ASPARTATE AMINOTRANSFERASE: 18 U/L (ref 8–34)
BILIRUBIN TOTAL: 0.2 mg/dL (ref 0.2–1.0)
BUN (UREA NITROGEN): 17 mg/dL (ref 7–18)
CALCIUM: 9.1 mg/dL (ref 8.5–10.1)
CARBON DIOXIDE: 29 mmol/L (ref 21–32)
CHLORIDE: 102 mmol/L (ref 98–107)
CREATININE: 0.7 mg/dL (ref 0.4–1.2)
ESTIMATED GLOMERULAR FILT RATE: >60 mL/min (ref >60)
Glucose Random: 90 mg/dL (ref 74–160)
POTASSIUM: 4.3 mmol/L (ref 3.5–5.1)
SODIUM: 140 mmol/L (ref 136–145)
TOTAL PROTEIN: 7.1 g/dL (ref 6.4–8.2)

## 2017-06-22 NOTE — Progress Notes (Signed)
Pt. Called and she states that current medication dose gives her headaches and cramps.. She would appreciate a call back to see if she could go back to the previous dose.           [name]  Female, 47 year old, 1970-03-26  MRN:   [id number]  Phone:   [phone] Alejandra Martin)  PCP:   Janann August  Primary Cvg:   [place]. Rollene Rotunda, MD, MD  10/28/2017 at 11:15 AM  Dr Allyne Gee   Received: Today   Message Contents   [name] [employer]   Phone Number: [phone]            [name] [id number], 47 year old, female, Telephone Information:   Home Phone   [phone]   Work Phone   [phone]   Mobile     [phone]       Patient's Preferred Pharmacy:     [facility] [place], [place]   Phone: [phone] Fax: [phone]       CONFIRMED TODAY: [name] NUMBER:  [phone]       Best time to call back: Anytime   Cell phone:   Other phone:     Available times:     Patient's language of care: English     Patient does not need an interpreter.     Patient's PCP: Janann August, MD     Person calling on behalf of patient: Patient (self)     Calls today to speak to provider about question on medication currently talking.pt requesting to speak a doctor directly,please call.Txs

## 2017-06-22 NOTE — Progress Notes (Signed)
Returned pt's call. Left msg after two attempts that I will have my nurse try reaching her.

## 2017-06-22 NOTE — Progress Notes (Signed)
Alejandra Martin  Female, 47 year old, 12/18/1970  MRN:   [id number]  Phone:   [phone] [name])  PCP:   Janann August  Primary Cvg:   [place]. Rollene Rotunda, MD, MD  10/28/2017 at 11:15 AM  Patient Calls     You Just now (4:39 PM)                                 Documentation        Tahereh Brigid Re  You 1 hour ago (3:07 PM)      Isa Rankin,   I tried calling her twice. Left vm.   Can you try reaching her and informing her that in March, her prolactin level was appropriately low so we decreased her cabergoline to half a tablet (0.25 mg) once a week. I am not sure who increased her dose, but she should be on this lower dose still. The plan was to repeat her cabergoline in June 2019.     Thanks   TG    Routing Comment        Tahereh Brigid Re routed conversation to You 1 hour ago (3:07 PM)      Riley Lam 1 hour ago (3:05 PM)         Returned pt's call. Left msg after two attempts that I will have my nurse try reaching her.                         Documentation       Tahereh Loleta Rose, [place] 1 hour ago (3:05 PM)       You routed conversation to [employer] 2 hours ago (1:56 PM)      You 2 hours ago (1:54 PM)         Pt. Called and she states that current medication dose gives her headaches and cramps.. She would appreciate a call back to see if she could go back to the previous dose.           Alejandra Martin  Female, 47 year old, 10-06-1970  MRN:   [id number]  Phone:   [phone] [name])  PCP:   Janann August  Primary Cvg:   [place]. Rollene Rotunda, MD, MD  10/28/2017 at 11:15 AM  Dr Allyne Gee   Received: Today   Message Contents       [name] Nurses Pool    Phone Number: [phone]             [place] [id number], 47 year old, female,  Telephone Information:   Home Phone   [phone]   Work Phone   [phone]   Mobile     [phone]       Patient's Preferred Pharmacy:     [facility] [place], [place]   Phone: [phone] Fax: [phone]       CONFIRMED TODAY: Alejandra Martin NUMBER:  [phone]       Best time to call back: Anytime   Cell phone:   Other phone:     Available times:  Patient's language of care: English     Patient does not need an interpreter.     Patient's PCP: Janann August, MD     Person calling on behalf of patient: Patient (self)     Calls today to speak to provider about question on medication currently talking.pt requesting to speak a doctor directly,please call.Txs                            Documentation       You  [name], [name] 2 hours ago (1:53 PM)          Encounter Status     Closed by Riley Lam on 06/22/17 at 3:07 PM     Pt. Called and message given. Pt. Still feels as though she should not be experiencing symptoms of headaches and cramping.  She would like to talk  to the MD.     Message forwarded

## 2017-06-22 NOTE — Progress Notes (Signed)
This 47 year old English speaking patient presents for follow up of her GERD, Barrett's and abnormal LFT's.    The patient denied abdominal pain, heartburn, regurgitation, dysphagia, odynophagia, early satiety, weight loss, nausea, vomiting, hematemesis, hematochezia, melena, bloating, eructation, flatulence, diarrhea, bowel urgency, constipation, fever, anemia,  or jaundice.    The patient now presents in follow up.      Patient Active Problem List:     Allergy, unspecified not elsewhere classified     Acute gastritis without mention of hemorrhage     Intestinal infection due to Clostridium difficile     Pure hypercholesterolemia     Major depressive disorder, recurrent episode, moderate with anxious distress (HCC)     Generalized anxiety disorder     Benign positional vertigo     Contraceptive management     Tubular adenoma of colon     GERD (gastroesophageal reflux disease)     Diarrhea     Cholecystitis     Epigastric abdominal pain     Mass of ampulla of Vater     Gastroesophageal reflux disease with esophagitis     Obesity     Colon polyp     Mucosal abnormality of duodenum     Abnormal LFTs     Allergic rhinitis due to pollen     Anisometropic amblyopia of right eye     Barrett's esophagus     Disorder of biliary tract     Gastroesophageal reflux disease     Mild intermittent asthma without complication     Prediabetes     Tinea pedis of both feet     Perimenopause     Hyperprolactinemia (HCC)      Past Surgical History:  No date: CHOLECYSTECTOMY  No date: NO SIGNIFICANT SURGICAL HISTORY  No date: OB ANTEPARTUM CARE CESAREAN DLVR & POSTPARTUM    Review of Patient's Allergies indicates:   Erythromycin            Itching   Erythromycin            Itching   Macrolides and keto*    Itching, Rash    Social Hx  Social History    Tobacco Use      Smoking status: Never Smoker      Smokeless tobacco: Never Used    Alcohol use: No    Drug use: No      Current Outpatient Medications   Medication Sig    loratadine  (CLARITIN) 10 MG tablet Take 1 tablet by mouth daily    cabergoline (DOSTINEX) 0.5 MG tablet Take 0.5 tablets by mouth every 7 days    omeprazole (PRILOSEC) 40 MG capsule Take 1 capsule by mouth daily    sertraline (ZOLOFT) 25 MG tablet Take 2 tablets by mouth daily Start 25mg  daily for one week then increase to 50mg  daily    fluticasone (FLOVENT HFA) 44 MCG/ACT inhaler Inhale 2 puffs into the lungs 2 (two) times daily    albuterol (PROVENTIL) (2.5 MG/3ML) 0.083% nebulizer solution Take 3 mLs by nebulization every 6 (six) hours as needed for Wheezing    albuterol (PROVENTIL HFA,VENTOLIN HFA, PROAIR HFA) 108 (90 Base) MCG/ACT inhaler Inhale 2 puffs into the lungs every 4 (four) hours as needed for Wheezing or Shortness of breath     No current facility-administered medications for this visit.        Family Hx  Review of patient's family history indicates:  Problem: OTHER      Relation:  Father          Age of Onset: (Not Specified)          Comment: leukemia   Problem: Diabetes      Relation: Mother          Age of Onset: (Not Specified)  Problem: Hypertension      Relation: Mother          Age of Onset: (Not Specified)  Problem: Lipids      Relation: Mother          Age of Onset: (Not Specified)  Problem: OTHER      Relation: Mother          Age of Onset: (Not Specified)          Comment: ESRD on HD  Problem: Hypertension      Relation: Father          Age of Onset: (Not Specified)  Problem: Hypertension      Relation: Brother          Age of Onset: (Not Specified)        REVIEW OF SYSTEMS:    Cardiovascular:  No chest pain, palpitations, MI or heart failure.  Pulmonary:  No pneumonia, TB or asthma.    Neuro:  No stroke, seizure or loss of consciousness.    Endocrine:  No diabetes or thyroid disease.       Physical Exam:  Vital Signs: BP 115/82 (Site: LA)  Pulse 90  Temp 97.9 F (36.6 C) (Temporal)  Resp 14  Ht 5\' 3"  (1.6 m)  Wt 129.7 kg (286 lb)  SpO2 97%  BMI 50.66 kg/m2 Body mass index is 50.66  kg/m.  General: Obese body habitus.   Pulmonary: Clear to auscultation and percussion. No rales, wheezes or rhonchi.  Cardiovascular: S1, S2, no S3 or murmurs.   Abdominal: Normal bowel sounds. No tenderness on light or deep palpation. No hepatomegaly or splenomegaly.  Neurological: Sensation intact. Normal motor function. Normal gait. Alert and oriented x 3      Impression:  Gastroesophageal reflux disease, esophagitis presence not specified  (primary encounter diagnosis)  Barrett's esophagus with dysplasia  Abnormal LFTs  History of colonic polyps  Morbid obesity [facility])    Medical Decision Making:  The patient will try and loose weight through diet and reasonable exercise. She continues to have the same symptoms of reflux, but these seem under control. She has been overeating recently (her sister was visiting and cooking for her). We discussed changing her diet.    Reviewed with patient who agrees with our plan. Follow up with me 4 months

## 2017-06-22 NOTE — Addendum Note (Signed)
Addended by: Sid Falcon on: 06/22/2017 12:07 PM     Modules accepted: Orders

## 2017-06-23 ENCOUNTER — Telehealth (HOSPITAL_BASED_OUTPATIENT_CLINIC_OR_DEPARTMENT_OTHER): Payer: Self-pay | Admitting: "Endocrinology

## 2017-06-23 ENCOUNTER — Telehealth (HOSPITAL_BASED_OUTPATIENT_CLINIC_OR_DEPARTMENT_OTHER): Payer: Self-pay

## 2017-06-23 ENCOUNTER — Ambulatory Visit (HOSPITAL_BASED_OUTPATIENT_CLINIC_OR_DEPARTMENT_OTHER): Payer: No Typology Code available for payment source

## 2017-06-23 NOTE — Progress Notes (Signed)
Called pt because she needs help filling out her disability application. She says she doesn't think she will be able to come to [place] so I need to ask if I can go to [place] to help her complete the application.

## 2017-06-23 NOTE — Progress Notes (Signed)
Left 3 rd msg on vm to cb. I will have my nurse try reaching her as well with my response.

## 2017-06-23 NOTE — Progress Notes (Signed)
Called the patient and left a message to call Dr Elberta Fortis office at [phone].

## 2017-06-23 NOTE — Telephone Encounter (Signed)
-----   Message from Riley Lam sent at 06/23/2017  3:39 PM EDT -----  Regarding: reply  Tried calling pt again. Left vm.     1. Please relay my response to her: cabergoline has a common side effect (26% risk) of causing headaches and cramping. These are considered common side effects of the medication. This is why I recommend taking the medication before bed.     2. If the headaches and cramping do not respond to tylenol/nsaids, then she can lower the dose to 1/2 pill every other week rather than every week.     3. If headache is extremely severe, please let me know.     Thanks  TG

## 2017-06-24 ENCOUNTER — Ambulatory Visit (HOSPITAL_BASED_OUTPATIENT_CLINIC_OR_DEPARTMENT_OTHER): Payer: PRIVATE HEALTH INSURANCE | Admitting: Physician Assistant

## 2017-06-24 ENCOUNTER — Ambulatory Visit (HOSPITAL_BASED_OUTPATIENT_CLINIC_OR_DEPARTMENT_OTHER): Payer: PRIVATE HEALTH INSURANCE | Admitting: Registered Nurse

## 2017-06-24 ENCOUNTER — Telehealth (HOSPITAL_BASED_OUTPATIENT_CLINIC_OR_DEPARTMENT_OTHER): Payer: Self-pay

## 2017-06-24 VITALS — BP 120/80 | HR 97 | Resp 20

## 2017-06-24 DIAGNOSIS — F419 Anxiety disorder, unspecified: Secondary | ICD-10-CM

## 2017-06-24 NOTE — Progress Notes (Signed)
Called pt to set up appt in [place], made appt for 9am on 06/26/17 at [facility].

## 2017-06-24 NOTE — Progress Notes (Signed)
Pt walked to [place] stating she has been sob x3 days.  Pt lungs cta, no wheeze, no cough, no pursed lip breathing, no arm pain or radiation. Pt reports medication compliance with everything except for zoloft which she hasn't taken in over a month.Denies chest pain but does feel tightness.  New stressor in life pt's daughter will have to go to the furthest school from their home due to school lottery.  Pt reports eating lots of food recently that could contribute to her GERD. RN advised pt of foods to avoid and suggested small bland balanced meals and continuing omeprazole.  Appt for pt to be seen with provider scheduled for Monday.  Instructed to seek ED or call 911 if worsening sob, feeling of doom.

## 2017-06-26 ENCOUNTER — Other Ambulatory Visit (HOSPITAL_BASED_OUTPATIENT_CLINIC_OR_DEPARTMENT_OTHER): Payer: Self-pay

## 2017-06-26 NOTE — Progress Notes (Signed)
I met with Alejandra Martin today from 9am-10:30 to help her fill out a Training and development officer form. We sat down and had to describe her activities and how they are affected by her condition, we also had to describe the work she did 15 years prior to her disability and how that work was affected by it. I faxed the form to the Social Security office and hopefully Annick will be finding out more information about her Social Security Disability eligibility soon. Tymia also mentioned that her daughter is moving a new school (going from elementary to middle school) and the commute for the new school is very far and she does not think she will be able to drive and take her daughter to school based on her condition next year. I told her that I would look into what I could do about switching her daughter's school and/or getting transportation to this new school and would get back to her hopefully within a week.

## 2017-06-29 ENCOUNTER — Telehealth (HOSPITAL_BASED_OUTPATIENT_CLINIC_OR_DEPARTMENT_OTHER): Payer: Self-pay

## 2017-06-29 ENCOUNTER — Ambulatory Visit (HOSPITAL_BASED_OUTPATIENT_CLINIC_OR_DEPARTMENT_OTHER): Payer: PRIVATE HEALTH INSURANCE | Admitting: Physician Assistant

## 2017-06-29 VITALS — BP 120/76 | HR 87 | Temp 97.5°F | Ht 64.0 in | Wt 286.2 lb

## 2017-06-29 DIAGNOSIS — Z131 Encounter for screening for diabetes mellitus: Secondary | ICD-10-CM

## 2017-06-29 DIAGNOSIS — F331 Major depressive disorder, recurrent, moderate: Secondary | ICD-10-CM

## 2017-06-29 DIAGNOSIS — Z6841 Body Mass Index (BMI) 40.0 and over, adult: Secondary | ICD-10-CM

## 2017-06-29 DIAGNOSIS — Z13220 Encounter for screening for lipoid disorders: Secondary | ICD-10-CM

## 2017-06-29 DIAGNOSIS — R0602 Shortness of breath: Secondary | ICD-10-CM

## 2017-06-29 DIAGNOSIS — J4521 Mild intermittent asthma with (acute) exacerbation: Secondary | ICD-10-CM

## 2017-06-29 MED ORDER — FLUTICASONE PROPIONATE HFA 44 MCG/ACT IN AERO
2.0000 | INHALATION_SPRAY | Freq: Two times a day (BID) | RESPIRATORY_TRACT | 2 refills | Status: DC
Start: 2017-06-29 — End: 2017-09-04

## 2017-06-29 MED ORDER — SERTRALINE HCL 25 MG PO TABS
50.0000 mg | ORAL_TABLET | Freq: Every day | ORAL | 1 refills | Status: DC
Start: 2017-06-29 — End: 2017-07-01

## 2017-06-29 MED ORDER — ALBUTEROL SULFATE HFA 108 (90 BASE) MCG/ACT IN AERS
2.0000 | INHALATION_SPRAY | RESPIRATORY_TRACT | 1 refills | Status: DC | PRN
Start: 2017-06-29 — End: 2017-07-17

## 2017-06-29 MED ORDER — PREDNISONE 50 MG PO TABS
50.00 mg | ORAL_TABLET | Freq: Every day | ORAL | 0 refills | Status: AC
Start: 2017-06-29 — End: 2017-07-04

## 2017-06-29 MED ORDER — ALBUTEROL SULFATE (2.5 MG/3ML) 0.083% IN NEBU
2.50 mg | INHALATION_SOLUTION | Freq: Once | RESPIRATORY_TRACT | Status: AC
Start: 2017-06-29 — End: 2017-06-29
  Administered 2017-06-29: 2.5 mg via RESPIRATORY_TRACT

## 2017-06-29 MED ORDER — ALBUTEROL SULFATE HFA 108 (90 BASE) MCG/ACT IN AERS: 2 | Inhaler | RESPIRATORY_TRACT | 1 refills | 0 days | Status: DC | PRN

## 2017-06-29 MED ORDER — SERTRALINE HCL 25 MG PO TABS: 50 mg | tablet | Freq: Every day | ORAL | 1 refills | 0 days | Status: DC

## 2017-06-29 MED ORDER — PREDNISONE 50 MG PO TABS: 50 mg | tablet | Freq: Every day | ORAL | 0 refills | 0 days | Status: AC

## 2017-06-29 MED ORDER — FLUTICASONE PROPIONATE HFA 44 MCG/ACT IN AERO: 2 | Inhaler | Freq: Two times a day (BID) | RESPIRATORY_TRACT | 2 refills | 0 days | Status: DC

## 2017-06-29 NOTE — Progress Notes (Signed)
Subjective     Alejandra Martin is a 47 year old female presents Patient presents with:  Follow Up: Anxiety  Breathing Problem    HPI    Breathing issue  - patient made appt d/t being out of sertraline and experiencing SOB starting last week  - denies fevers/chills  - has known asthma for which she uses flovent  - over last week has noticed some worsening sx  - experiencing more shortness of breath, mostly after exertion but also worsens with anxiety  - no fevers  - no increased cough  - improves after inhaler use, however has been using only Flovent at home  - woke up in middle of night needing to use inhaler  - nonsmoker  - no hx of cad  - no chest pain  - no sob at rest, but does get with exertion    Anxiety/depression  Pt notes increased anxieyt/depression sx in setting of having ran out of sertraline rx 1 month ago  No si  Would like to restart  Has therpay upcoming as well    PHQ-9 TOTAL SCORE 06/29/2017 03/17/2017 06/30/2005   Questionnaire Total Score - - 7   Doc FlowSheet Total Score 13 6 -             Social History     Socioeconomic History    Marital status: Married     Spouse name: Not on file    Number of children: Not on file    Years of education: Not on file    Highest education level: Not on file   Occupational History    Not on file   Social Needs    Financial resource strain: Not on file    Food insecurity:     Worry: Not on file     Inability: Not on file    Transportation needs:     Medical: Not on file     Non-medical: Not on file   Tobacco Use    Smoking status: Never Smoker    Smokeless tobacco: Never Used   Substance and Sexual Activity    Alcohol use: No    Drug use: No    Sexual activity: Yes     Partners: Male     Comment: men age 52 q 4-6 wks x 3d, small fibroid on u/s, 1st SA age 29, 2 lifetime partners, current x 8 yrs   Lifestyle    Physical activity:     Days per week: Not on file     Minutes per session: Not on file    Stress: Not on file   Relationships    Social  connections:     Talks on phone: Not on file     Gets together: Not on file     Attends religious service: Not on file     Active member of club or organization: Not on file     Attends meetings of clubs or organizations: Not on file     Relationship status: Not on file    Intimate partner violence:     Fear of current or ex partner: Not on file     Emotionally abused: Not on file     Physically abused: Not on file     Forced sexual activity: Not on file   Other Topics Concern    Not on file   Social History Narrative    07/09/2005    [name] family  at age 47, husband from [place]    Works [employer]    Pt thrilled with pregnancy-seeking pregnancy x 4 yrs, very anxious since sab 2 yrs ago    Has one child    Not working now    Lives with husband and daughter    Alton Revere, MD, 05/13/2016, 1:50 PM     Patient Active Problem List:     Allergy, unspecified not elsewhere classified     Acute gastritis without mention of hemorrhage     Intestinal infection due to Clostridium difficile     Pure hypercholesterolemia     Major depressive disorder, recurrent episode, moderate with anxious distress ([place])     Generalized anxiety disorder     Benign positional vertigo     Contraceptive management     Tubular adenoma of colon     GERD (gastroesophageal reflux disease)     Diarrhea     Cholecystitis     Epigastric abdominal pain     Mass of ampulla of Vater     Gastroesophageal reflux disease with esophagitis     Obesity     Colon polyp     Mucosal abnormality of duodenum     Abnormal LFTs     Allergic rhinitis due to pollen     Anisometropic amblyopia of right eye     Barrett's esophagus     Disorder of biliary tract     Gastroesophageal reflux disease     Mild intermittent asthma without complication     Prediabetes     Tinea pedis of both feet     Perimenopause     Hyperprolactinemia (HCC)     BMI 45.0-49.9, adult ([place])    Past Surgical History:  No date:  CHOLECYSTECTOMY  No date: NO SIGNIFICANT SURGICAL HISTORY  No date: OB ANTEPARTUM CARE CESAREAN DLVR & POSTPARTUM  Review of patient's family history indicates:  Problem: OTHER      Relation: Father          Age of Onset: (Not Specified)          Comment: leukemia   Problem: Diabetes      Relation: Mother          Age of Onset: (Not Specified)  Problem: Hypertension      Relation: Mother          Age of Onset: (Not Specified)  Problem: Lipids      Relation: Mother          Age of Onset: (Not Specified)  Problem: OTHER      Relation: Mother          Age of Onset: (Not Specified)          Comment: ESRD on HD  Problem: Hypertension      Relation: Father          Age of Onset: (Not Specified)  Problem: Hypertension      Relation: Brother          Age of Onset: (Not Specified)      Current Outpatient Medications:  sertraline (ZOLOFT) 25 MG tablet Take 2 tablets by mouth daily Start 25mg  daily for one week then increase to 50mg  daily Disp: 60 tablet Rfl: 1   fluticasone (FLOVENT HFA) 44 MCG/ACT inhaler Inhale 2 puffs into the lungs 2 (two) times daily Disp: 1 Inhaler Rfl: 2   predniSONE (DELTASONE) 50 MG tablet Take 1 tablet by mouth daily for 5 days  Disp: 5 tablet Rfl: 0   albuterol (PROVENTIL HFA,VENTOLIN HFA, PROAIR HFA) 108 (90 Base) MCG/ACT inhaler Inhale 2 puffs into the lungs every 4 (four) hours as needed for Wheezing or Shortness of breath Disp: 1 Inhaler Rfl: 1   loratadine (CLARITIN) 10 MG tablet Take 1 tablet by mouth daily Disp: 30 tablet Rfl: 11   cabergoline (DOSTINEX) 0.5 MG tablet Take 0.5 tablets by mouth every 7 days Disp: 12 tablet Rfl: 3   omeprazole (PRILOSEC) 40 MG capsule Take 1 capsule by mouth daily Disp: 30 capsule Rfl: 2   albuterol (PROVENTIL) (2.5 MG/3ML) 0.083% nebulizer solution Take 3 mLs by nebulization every 6 (six) hours as needed for Wheezing Disp: 75 mL Rfl: 0     No current facility-administered medications for this visit.   Review of Patient's Allergies indicates:   Erythromycin             Itching   Erythromycin            Itching   Macrolides and keto*    Itching, Rash              Objective     BP 120/76  Pulse 87  Temp 97.5 F (36.4 C) (Temporal)  Ht 5\' 4"  (1.626 m)  Wt 129.8 kg (286 lb 3.2 oz)  SpO2 98%  BMI 49.13 kg/m2    Physical Exam   Constitutional: She appears well-developed and well-nourished.   HENT:   Head: Normocephalic and atraumatic.   Cardiovascular: Normal rate, regular rhythm, normal heart sounds and intact distal pulses. Exam reveals no gallop and no friction rub.   No murmur heard.  Pulmonary/Chest: Effort normal. No respiratory distress. She has wheezes. She has no rales.   Neurological: She is alert.   Psychiatric: She has a normal mood and affect. Her behavior is normal.   Vitals reviewed.      EKG: 78 bpm NSR with no st elevation or t wave inversion, does show low voltage QRS  - reviewed with Dr. Lisa Roca in clinic         Assessment     Problem List Items Addressed This Visit     Mild intermittent asthma without complication    BMI 16.1-09.6, adult ([place])      Other Visit Diagnoses     Mild intermittent asthma with acute exacerbation        Relevant Medications    fluticasone (FLOVENT HFA) 44 MCG/ACT inhaler    SOB (shortness of breath) on exertion        Relevant Orders    EKG (Completed)    Lipid screening        Relevant Orders    LIPID PANEL    COLLECTION VENOUS BLOOD VENIPUNCTURE    Diabetes mellitus screening        Relevant Orders    HEMOGLOBIN E4V    BASIC METABOLIC PANEL    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[place])        Mild intermittent asthma, unspecified whether complicated        Relevant Medications    albuterol (PROVENTIL HFA,VENTOLIN HFA, PROAIR HFA) 108 (90 Base) MCG/ACT inhaler                Plan   (J45.21) Mild intermittent asthma with acute exacerbation  (primary encounter diagnosis)  (R06.02) SOB (shortness of breath) on exertion  Plan: EKG  ekg nonischemic  Most consistent with asthma exacerbation in setting of no albuterol (rescue inhaler) use  Alb given in clinic  and patient's lungs cleared significantly with persistent scattered end expiratory wheezes  Given severity of sx will burst prednisone and add scheduled albuterol (q6 hours, then q8, then q10-12) and recheck at end of week to make sure no prolonged course needed  Aware if worsening sx to present to ed  Refill flovent as well    (Z13.220) Lipid screening  Plan: COLLECTION VENOUS BLOOD VENIPUNCTURE, CANCELED:        LIPID PANEL    (Z13.1) Diabetes mellitus screening  Plan: CANCELED: HEMOGLOBIN A1C, CANCELED: BASIC         METABOLIC PANEL    (O53.6) Major depressive disorder, recurrent episode, moderate with anxious distress ([place])  Plan:   Following with therapy  Restart sertraline, common ae reviewed    Orders Placed This Encounter      ROUTINE VENIPUNCTURE          Standing Status: Future          Standing Expiration Date: 07/09/2017      Lipid Panel      Hemoglobin A1c      Basic Metabolic Panel      EKG          Order Specific Question: Indication for Exam (EKG):          Answer: Short of Breath-R06.02      albuterol (PROVENTIL) nebulizer solution 2.5 mg      sertraline (ZOLOFT) 25 MG tablet          Sig: Take 2 tablets by mouth daily Start 25mg  daily for one week then increase to 50mg  daily          Dispense:  60 tablet          Refill:  1      fluticasone (FLOVENT HFA) 44 MCG/ACT inhaler          Sig: Inhale 2 puffs into the lungs 2 (two) times daily          Dispense:  1 Inhaler          Refill:  2      predniSONE (DELTASONE) 50 MG tablet          Sig: Take 1 tablet by mouth daily for 5 days          Dispense:  5 tablet          Refill:  0      albuterol (PROVENTIL HFA,VENTOLIN HFA, PROAIR HFA) 108 (90 Base) MCG/ACT inhaler          Sig: Inhale 2 puffs into the lungs every 4 (four) hours as needed for Wheezing or Shortness of breath          Dispense:  1 Inhaler          Refill:  1     We discussed the patients current medications. The patient expressed understanding and no barriers to adherence were  identified.         follow up end of week  Sooner prn    Yanitza Shvartsman Fransisco Hertz, PA-C

## 2017-06-29 NOTE — Progress Notes (Signed)
Prior authorization required for medication: sertraline (ZOLOFT) 25 MG tablet    Membership ID (insurance): [place] : [id number]  if Medicare insurance , Prescription drug plan: Unknown  .

## 2017-06-29 NOTE — Progress Notes (Signed)
Please note that patient has [facility] for insurance and they will only cover 1 tab per day. Please send 2 new scripts:     Sertraline 25mg  (1 tab daily for 7 days then start 50mg )     and     Sertraline 50mg   (1 tab daily)

## 2017-06-29 NOTE — Progress Notes (Signed)
Administered Albuterol nebulizer solution 2.5mg /80ml.   Patient tolerated well. No adverse reaction.    Janeece Fitting, RN, 06/29/2017, 9:06 AM

## 2017-07-01 ENCOUNTER — Telehealth (HOSPITAL_BASED_OUTPATIENT_CLINIC_OR_DEPARTMENT_OTHER): Payer: Self-pay

## 2017-07-01 MED ORDER — SERTRALINE HCL 50 MG PO TABS
50.0000 mg | ORAL_TABLET | Freq: Every day | ORAL | 0 refills | Status: DC
Start: 2017-07-08 — End: 2017-09-04

## 2017-07-01 MED ORDER — SERTRALINE HCL 25 MG PO TABS
25.0000 mg | ORAL_TABLET | Freq: Every day | ORAL | 0 refills | Status: DC
Start: 2017-07-01 — End: 2017-10-27

## 2017-07-01 MED ORDER — SERTRALINE HCL 25 MG PO TABS: 25 mg | tablet | Freq: Every day | ORAL | 0 refills | 0 days | Status: DC

## 2017-07-01 MED ORDER — SERTRALINE HCL 50 MG PO TABS: 50 mg | tablet | Freq: Every day | ORAL | 0 refills | 0 days | Status: DC

## 2017-07-01 NOTE — Progress Notes (Signed)
Pt called to see if there was any update to see if I had contacted [employer]. I told her that she would need to sign an ROI for [employer] and that I want her to sign an ROI for the [employer] [place] so that I can communicate directly with these organizations on her behalf. I will message Luella Cook to have her fill out 2 ROI's and have them fax it to me so that I can communicate with these agencies.

## 2017-07-01 NOTE — Addendum Note (Signed)
Addended by: Leward Quan on: 07/01/2017 01:29 PM     Modules accepted: Orders

## 2017-07-02 ENCOUNTER — Ambulatory Visit (HOSPITAL_BASED_OUTPATIENT_CLINIC_OR_DEPARTMENT_OTHER): Payer: PRIVATE HEALTH INSURANCE | Admitting: Physician Assistant

## 2017-07-02 ENCOUNTER — Other Ambulatory Visit (HOSPITAL_BASED_OUTPATIENT_CLINIC_OR_DEPARTMENT_OTHER): Payer: Self-pay | Admitting: Social Worker

## 2017-07-02 ENCOUNTER — Ambulatory Visit (HOSPITAL_BASED_OUTPATIENT_CLINIC_OR_DEPARTMENT_OTHER): Payer: PRIVATE HEALTH INSURANCE

## 2017-07-02 DIAGNOSIS — F331 Major depressive disorder, recurrent, moderate: Secondary | ICD-10-CM

## 2017-07-02 LAB — EKG

## 2017-07-02 NOTE — Progress Notes (Signed)
OUTPATIENT PSYCHIATRY PROGRESS NOTE    INTERPRETER: No    CONTACT INFO FOR OTHER AGENCIES AND MENTAL HEALTH PROVIDERS (IF APPLICABLE): N/A    PROBLEM(S) ADDRESSED IN THIS SESSION:   - Depressed mood  - Anxiety symptoms and ruminative worries    SUBJECTIVE  TODAY'S CHIEF COMPLAINT AND CLINICAL UPDATES IN PATIENT'S WORDS:  1) Chief Complaint (Patient and/or guardian's own words, concerns and expressed thoughts): "I'm just worried about my daughter, all the time"    2) New information from patient and/or collateral (Patient's illness: context, course, modifying factors, severity, cultural, family, social, medical history):   - Pt describes feelings anxiety, worry about her daughter, frustrations around school district  - Describes not getting enough sleep due to worries, always tired  - Describes feelings of depression, hopelessness that things will get better.     OBJECTIVE  DATA REVIEWED (Consider medical labs, radiology, other medical tests; screening/outcome measures; psychological testing; discussion of test results with other clinicians; consultation with other clinicians and systems involved with patient, summary of old records): Chart reviewed    CURRENT MEDICATIONS (make clear medications prescribed by psychiatry; include OTC medications):  Current Outpatient Medications   Medication Sig    sertraline (ZOLOFT) 25 MG tablet Take 1 tablet by mouth daily for 7 days    [START ON 07/08/2017] sertraline (ZOLOFT) 50 MG tablet Take 1 tablet by mouth daily for 21 days    fluticasone (FLOVENT HFA) 44 MCG/ACT inhaler Inhale 2 puffs into the lungs 2 (two) times daily    predniSONE (DELTASONE) 50 MG tablet Take 1 tablet by mouth daily for 5 days    albuterol (PROVENTIL HFA,VENTOLIN HFA, PROAIR HFA) 108 (90 Base) MCG/ACT inhaler Inhale 2 puffs into the lungs every 4 (four) hours as needed for Wheezing or Shortness of breath    loratadine (CLARITIN) 10 MG tablet Take 1 tablet by mouth daily    cabergoline (DOSTINEX)  0.5 MG tablet Take 0.5 tablets by mouth every 7 days    omeprazole (PRILOSEC) 40 MG capsule Take 1 capsule by mouth daily    albuterol (PROVENTIL) (2.5 MG/3ML) 0.083% nebulizer solution Take 3 mLs by nebulization every 6 (six) hours as needed for Wheezing     No current facility-administered medications for this visit.        MEDICATION ADHERENCE (including barriers and how addressed): Yes  MEDICATION SIDE EFFECTS (Prescribers Only): N/A    BIRTH CONTROL (ask females and males): Deferred    CURRENT PREGNANCY: No                                    MENTAL STATUS EXAMINATION                     General Appearance: Pt appearing well groomed, dressed casually     Interaction with Interviewer (eye contact, attitude, behavior): Within normal limits    Physical Signs    Gait and Station (how patient walks and stands): Within normal limits                  Physical Appearance: Within normal limits          Normal Movements: Within normal limits         Speech (rate, volume, articulation): Within normal limits          Language: [place] first language, speaks [place] fluently  Mood: "Do you understand? This is really heard"           Affect: tearful, frustrated, dysthymic          Thought Process     Rate; concrete vs abstract reasoning: Within normal limits        Logical vs illogical; associations: loose, tangential, circumstantial, intact: Within normal limits                                    Thought Content    Normal Thought Content (other than safety): Within normal limits     Perceptions (auditory, visual, tactile, etc.): None       Impulse Control: Good         Cognition (Link to [facility])    Orientation (person, place, time): oriented x3      Recent and remote memory: Within normal limits           Attention span and concentration: Within normal limits         Fund of knowledge, awareness of current events and vocabulary: Within normal limits      Judgment: Good          Insight:  Good        Suicidality/Homicidality/Aggression (Victimization or Perpetration): None. Pt describes religion as protective factor to suicide.      ASSESSMENT   Today's Assessment:  Pt appearing frustrated, dysthymic, upset. Pt is stable.    Risk Level Assessment  Risk Level Change (if yes, please describe): No    Suicide: low (1)  Violence: low (1)  Addiction: low (1)        Protective Factors: Religious faith, primary supports, housing, hard working    [place] (psychiatric diagnoses and medical diagnoses that factor into management of psychiatric treatment): Major Depressive Disorder, recurrent episode, moderate with anxious distress    CLINICAL FORMULATION (Make changes as your understanding changes. Should coincide with treatment plan): Pt is a 47yo [place] married mother of an 4yo daughter suffering from depression and anxiety symptoms. These symptoms appear to be related to health concerns, originating from the time when her daughter was born with bad acid reflux resulting in necessary hospital care of her newborn. Depressive and anxiety symptoms appear to have worsened in the context of father's multiple cancer diagnosis, and following his death approximately five years ago. Pt's mother is also now ill, and is currently in dialysis. Pt may benefit from therapy in order to process losses, and develop skills in which to manage anxiety and depression.      REVIEWING TODAY'S VISIT  CLINICAL INTERVENTIONS TODAY: Active listening, exploratory questions, validation, connecting thoughts/ feelings/ behaviors, psychoeducation about exercise/ diet/ sleep, encouraging her to talk about her grief, sleep hygiene, Identifying unhelpful thought patterns exercise, talking about motivation    PATIENT'S RESPONSE TO INTERVENTIONS: Pt talkative and engaged    PROGRESS TOWARDS GOALS: Slow    TIME SPENT IN PSYCHOTHERAPY: 45 minutes    [place] (Consider risk plan for patients at moderate or  high risk for suicide/violence/addiction; medication plan; referrals, etc. Must coincide with treatment plan.): Continue to monitor for changes in risk.    PLAN FOR ONGOING TREATMENT:  Weekly psychotherapy    INFORMED CONSENT (for any new treatment): Patient was informed of the potential risks and benefits of the treatment, including the option not to treat, and appeared to understand and agreed  to comply. Discussion included the following key points: Confidentiality, frame of treatment.      ONLY FOR PRESCRIBERS DOING EVALUATION AND MANAGEMENT VISITS     Use only when no psychotherapy performed  COUNSELING AND COORDINATION OF CARE PROVIDED (Consider diagnostic results/impressions and/or recommended studies; risks and benefits of treatment options; instruction for management/treatment and/or follow-up; importance of compliance with chosen treatment options; risk factor reduction; patient/family/caregiver education; prognosis): N/A      Over 50% of time during today's visit was devoted to counseling and/or coordination of care.  If yes, record estimated duration of the face to face encounter.  N/A    INSTRUCTIONS TO COVERING CLINICIANS:  N/A        ........Marland Kitchen

## 2017-07-02 NOTE — Progress Notes (Signed)
07/02/17-Thank you for your referral.  Patient was informed that she has been added to waiting list for assignment.    ([facility])

## 2017-07-03 ENCOUNTER — Ambulatory Visit (HOSPITAL_BASED_OUTPATIENT_CLINIC_OR_DEPARTMENT_OTHER): Payer: PRIVATE HEALTH INSURANCE

## 2017-07-03 DIAGNOSIS — Z131 Encounter for screening for diabetes mellitus: Secondary | ICD-10-CM

## 2017-07-03 DIAGNOSIS — E221 Hyperprolactinemia: Secondary | ICD-10-CM

## 2017-07-03 LAB — LIPID PANEL
Cholesterol: 267 mg/dL (ref 0–239)
HIGH DENSITY LIPOPROTEIN: 52 mg/dL (ref 40–?)
LOW DENSITY LIPOPROTEIN DIRECT: 196 mg/dL (ref 0–189)
TRIGLYCERIDES: 165 mg/dL — ABNORMAL HIGH (ref 0–150)

## 2017-07-03 LAB — BASIC METABOLIC PANEL
ANION GAP: 6 mmol/L (ref 5–15)
BUN (UREA NITROGEN): 19 mg/dL — ABNORMAL HIGH (ref 7–18)
CALCIUM: 9.1 mg/dL (ref 8.5–10.1)
CARBON DIOXIDE: 28 mmol/L (ref 21–32)
CHLORIDE: 108 mmol/L — ABNORMAL HIGH (ref 98–107)
CREATININE: 0.8 mg/dL (ref 0.4–1.2)
ESTIMATED GLOMERULAR FILT RATE: >60 mL/min (ref >60)
Glucose Random: 71 mg/dL — ABNORMAL LOW (ref 74–160)
POTASSIUM: 3.9 mmol/L (ref 3.5–5.1)
SODIUM: 142 mmol/L (ref 136–145)

## 2017-07-03 LAB — PROLACTIN: PROLACTIN: 2.2 ng/mL (ref 2.2–30.3)

## 2017-07-03 NOTE — Progress Notes (Signed)
Labs drawn.1 sst &1 lav  Cherene Julian, 07/03/2017

## 2017-07-04 ENCOUNTER — Encounter (HOSPITAL_BASED_OUTPATIENT_CLINIC_OR_DEPARTMENT_OTHER): Payer: Self-pay | Admitting: Physician Assistant

## 2017-07-06 LAB — HEMOGLOBIN A1C
ESTIMATED AVERAGE GLUCOSE: 114 (ref 74–160)
HEMOGLOBIN A1C: 5.6 % (ref 4.0–5.6)

## 2017-07-09 ENCOUNTER — Telehealth (HOSPITAL_BASED_OUTPATIENT_CLINIC_OR_DEPARTMENT_OTHER): Payer: Self-pay

## 2017-07-09 ENCOUNTER — Ambulatory Visit (HOSPITAL_BASED_OUTPATIENT_CLINIC_OR_DEPARTMENT_OTHER): Payer: PRIVATE HEALTH INSURANCE

## 2017-07-09 NOTE — Progress Notes (Signed)
Called pt to let her know that I was unsuccessful in the attempts to have her daughter transferred from school. Told her that if she wanted to speak about it more that she should call me back. I have helped patient with disability application and therefore will be closing the referral.

## 2017-07-11 ENCOUNTER — Encounter (HOSPITAL_BASED_OUTPATIENT_CLINIC_OR_DEPARTMENT_OTHER): Payer: Self-pay | Admitting: Physician Assistant

## 2017-07-11 DIAGNOSIS — E785 Hyperlipidemia, unspecified: Secondary | ICD-10-CM | POA: Insufficient documentation

## 2017-07-15 ENCOUNTER — Telehealth (HOSPITAL_BASED_OUTPATIENT_CLINIC_OR_DEPARTMENT_OTHER): Payer: Self-pay | Admitting: Case Management

## 2017-07-15 NOTE — Progress Notes (Signed)
Alejandra Martin  P Rhc Rn [employer], can you please call patient and discuss:     a1c- normal, no DM   Kidney fct- overall ok, mild dehydration. Also glucose was a little low, but may have been d/t fasting state. Please make sure no s/sx of hypoglycemia throughout the day   Prolactin- low normal range, please encourage pt to discuss with her endocrinologist   Lipids- very high. I would recommend starting medication as her "bad cholesterol" is above 190. If agreeable we can start a statin. Please discuss common AE if this is case     Thanks!   Gregary Signs      Call to patient re lab results.  She wants to hold off on starting a statin.  Will discuss Cholesterol results  further with medical specialties    Expresses understanding re results.

## 2017-07-17 ENCOUNTER — Encounter (HOSPITAL_BASED_OUTPATIENT_CLINIC_OR_DEPARTMENT_OTHER): Payer: Self-pay | Admitting: Gastroenterology

## 2017-07-17 ENCOUNTER — Ambulatory Visit (HOSPITAL_BASED_OUTPATIENT_CLINIC_OR_DEPARTMENT_OTHER): Payer: PRIVATE HEALTH INSURANCE | Admitting: Physician Assistant

## 2017-07-17 VITALS — BP 130/80 | HR 92 | Temp 97.4°F | Ht 64.0 in | Wt 286.8 lb

## 2017-07-17 DIAGNOSIS — E785 Hyperlipidemia, unspecified: Secondary | ICD-10-CM

## 2017-07-17 DIAGNOSIS — R079 Chest pain, unspecified: Secondary | ICD-10-CM

## 2017-07-17 DIAGNOSIS — Z6841 Body Mass Index (BMI) 40.0 and over, adult: Secondary | ICD-10-CM

## 2017-07-17 DIAGNOSIS — J4521 Mild intermittent asthma with (acute) exacerbation: Secondary | ICD-10-CM

## 2017-07-17 MED ORDER — FLUTICASONE PROPIONATE 50 MCG/ACT NA SUSP
1.00 | Freq: Every day | NASAL | 0 refills | Status: AC
Start: 2017-07-17 — End: 2017-08-16

## 2017-07-17 MED ORDER — ALBUTEROL SULFATE HFA 108 (90 BASE) MCG/ACT IN AERS
2.00 | INHALATION_SPRAY | RESPIRATORY_TRACT | 1 refills | Status: AC | PRN
Start: 2017-07-17 — End: 2017-08-16

## 2017-07-17 MED ORDER — ATORVASTATIN CALCIUM 40 MG PO TABS
40.0000 mg | ORAL_TABLET | Freq: Every day | ORAL | 11 refills | Status: DC
Start: 2017-07-17 — End: 2017-10-27

## 2017-07-17 MED ORDER — ALBUTEROL SULFATE HFA 108 (90 BASE) MCG/ACT IN AERS: 2 | Inhaler | RESPIRATORY_TRACT | 1 refills | 0 days | Status: AC | PRN

## 2017-07-17 MED ORDER — ATORVASTATIN CALCIUM 40 MG PO TABS: 40 mg | tablet | Freq: Every day | ORAL | 11 refills | 0 days | Status: DC

## 2017-07-17 MED ORDER — FLUTICASONE PROPIONATE 50 MCG/ACT NA SUSP: 1 | Bottle | Freq: Every day | NASAL | 0 refills | 0 days | Status: AC

## 2017-07-17 NOTE — Progress Notes (Signed)
Subjective     Alejandra Martin is a 47 year old female presents Patient presents with:  Follow Up: Asthma and anxiety    HPI    Asthma  - seen for flare 5/13  - no more chest pain, has some pinching symptoms on left side of chest  - notes some arm numbness if drives a lot, gets better with tylenol  -no current sob  - however gets back spasms and when this happens have to   - right now using flovent prn, mostly in morning  - no chest pain now  - no cough  - no fevers      Mood  - restarted sertraline, doesn't feel has changes  - no si  - has been following with therapy  - notes still with chest pains which she relates in large part to anxiety    PHQ-9 TOTAL SCORE 07/17/2017 06/29/2017 03/17/2017   Questionnaire Total Score - - -   Doc FlowSheet Total Score 11 13 6        Chest pain   - none now, no changes from last time  - happens both at rest and with exercise  - very mild, lasts few seconds, stabbing at upper chest  - sometimes gets arm numbness, however this is when she is driving a lot  - sometimes gets back pain at left arm, feels like mm spasm    Results  - had received by phone, wanted to discuss starting statin    Weight  Body mass index is 49.23 kg/m.  Interested in weight loss         Social History     Socioeconomic History    Marital status: Married     Spouse name: Not on file    Number of children: Not on file    Years of education: Not on file    Highest education level: Not on file   Occupational History    Not on file   Social Needs    Financial resource strain: Not on file    Food insecurity:     Worry: Not on file     Inability: Not on file    Transportation needs:     Medical: Not on file     Non-medical: Not on file   Tobacco Use    Smoking status: Never Smoker    Smokeless tobacco: Never Used   Substance and Sexual Activity    Alcohol use: No    Drug use: No    Sexual activity: Yes     Partners: Male     Comment: men age 66 q 4-6 wks x 3d, small fibroid on u/s, 1st SA age 74, 2 lifetime  partners, current x 8 yrs   Lifestyle    Physical activity:     Days per week: Not on file     Minutes per session: Not on file    Stress: Not on file   Relationships    Social connections:     Talks on phone: Not on file     Gets together: Not on file     Attends religious service: Not on file     Active member of club or organization: Not on file     Attends meetings of clubs or organizations: Not on file     Relationship status: Not on file    Intimate partner violence:     Fear of current or ex partner: Not on file  Emotionally abused: Not on file     Physically abused: Not on file     Forced sexual activity: Not on file   Other Topics Concern    Not on file   Social History Narrative    07/09/2005    [name] with family at age 70, husband from [place]    Works [employer]    Pt thrilled with pregnancy-seeking pregnancy x 4 yrs, very anxious since sab 2 yrs ago    Has one child    Not working now    Lives with husband and daughter    Alton Revere, MD, 05/13/2016, 1:50 PM     Patient Active Problem List:     Allergy, unspecified not elsewhere classified     Acute gastritis without mention of hemorrhage     Intestinal infection due to Clostridium difficile     Pure hypercholesterolemia     Major depressive disorder, recurrent episode, moderate with anxious distress ([place])     Generalized anxiety disorder     Benign positional vertigo     Contraceptive management     Tubular adenoma of colon     GERD (gastroesophageal reflux disease)     Diarrhea     Cholecystitis     Epigastric abdominal pain     Mass of ampulla of Vater     Gastroesophageal reflux disease with esophagitis     Obesity     Colon polyp     Mucosal abnormality of duodenum     Abnormal LFTs     Allergic rhinitis due to pollen     Anisometropic amblyopia of right eye     Barrett's esophagus     Disorder of biliary tract     Gastroesophageal reflux disease     Mild intermittent asthma without  complication     Prediabetes     Tinea pedis of both feet     Perimenopause     Hyperprolactinemia ([place])     BMI 45.0-49.9, adult ([place])     Hyperlipidemia    Past Surgical History:  No date: CHOLECYSTECTOMY  No date: NO SIGNIFICANT SURGICAL HISTORY  No date: OB ANTEPARTUM CARE CESAREAN DLVR & POSTPARTUM  Review of patient's family history indicates:  Problem: OTHER      Relation: Father          Age of Onset: (Not Specified)          Comment: leukemia   Problem: Diabetes      Relation: Mother          Age of Onset: (Not Specified)  Problem: Hypertension      Relation: Mother          Age of Onset: (Not Specified)  Problem: Lipids      Relation: Mother          Age of Onset: (Not Specified)  Problem: OTHER      Relation: Mother          Age of Onset: (Not Specified)          Comment: ESRD on HD  Problem: Hypertension      Relation: Father          Age of Onset: (Not Specified)  Problem: Hypertension      Relation: Brother          Age of Onset: (Not Specified)      Current Outpatient Medications:  sertraline (ZOLOFT) 25 MG tablet  Take 1 tablet by mouth daily for 7 days Disp: 7 tablet Rfl: 0   sertraline (ZOLOFT) 50 MG tablet Take 1 tablet by mouth daily for 21 days Disp: 21 tablet Rfl: 0   fluticasone (FLOVENT HFA) 44 MCG/ACT inhaler Inhale 2 puffs into the lungs 2 (two) times daily Disp: 1 Inhaler Rfl: 2   albuterol (PROVENTIL HFA,VENTOLIN HFA, PROAIR HFA) 108 (90 Base) MCG/ACT inhaler Inhale 2 puffs into the lungs every 4 (four) hours as needed for Wheezing or Shortness of breath Disp: 1 Inhaler Rfl: 1   loratadine (CLARITIN) 10 MG tablet Take 1 tablet by mouth daily Disp: 30 tablet Rfl: 11   cabergoline (DOSTINEX) 0.5 MG tablet Take 0.5 tablets by mouth every 7 days Disp: 12 tablet Rfl: 3   omeprazole (PRILOSEC) 40 MG capsule Take 1 capsule by mouth daily Disp: 30 capsule Rfl: 2   albuterol (PROVENTIL) (2.5 MG/3ML) 0.083% nebulizer solution Take 3 mLs by nebulization every 6 (six) hours as needed for Wheezing  Disp: 75 mL Rfl: 0     No current facility-administered medications for this visit.   Review of Patient's Allergies indicates:   Erythromycin            Itching   Erythromycin            Itching   Macrolides and keto*    Itching, Rash              Objective     BP 130/80  Pulse 92  Temp 97.4 F (36.3 C) (Temporal)  Ht 5\' 4"  (1.626 m)  Wt 130.1 kg (286 lb 12.8 oz)  SpO2 97%  BMI 49.23 kg/m2    Physical Exam   Constitutional: She appears well-developed and well-nourished.   HENT:   Head: Normocephalic and atraumatic.   Cardiovascular: Normal rate, regular rhythm, normal heart sounds and intact distal pulses. Exam reveals no gallop and no friction rub.   No murmur heard.  Pulmonary/Chest: Effort normal and breath sounds normal. No respiratory distress. She has no wheezes. She has no rales.   Neurological: She is alert.   Psychiatric: She has a normal mood and affect. Her behavior is normal.   Vitals reviewed.               Assessment     Problem List Items Addressed This Visit     BMI 45.0-49.9, adult ([place])      Other Visit Diagnoses     Mild intermittent asthma with acute exacerbation    -  Primary    Relevant Medications    albuterol (PROVENTIL HFA,VENTOLIN HFA, PROAIR HFA) 108 (90 Base) MCG/ACT inhaler    Chest pain, unspecified type        Relevant Orders    CARDIAC STRESS TEST: EKG                Plan   (J45.21) Mild intermittent asthma with acute exacerbation  (primary encounter diagnosis)  Plan: albuterol (PROVENTIL HFA,VENTOLIN HFA, PROAIR         HFA) 108 (90 Base) MCG/ACT inhaler  - exam reveals clear lungs, no increased effort of breathing, excellent oxygenation  - education around appropriate use of inhalers  - fu in 1-2 weeks to reassess once this has changed to see if control optimized  - will also add flonase for allergies    (R07.9) Chest pain, unspecified type  Plan: CARDIAC STRESS TEST: EKG  - while this is likely muscular or costochondral by description, given  patients' age, bmi, and lipid levels  would benefit from further cardiac testing.  - discussed if pain lasts longer than usual, radiates, or is exertional, more severe or associated with new sx to present for immediate care to ED    (Z68.42) Body mass index (BMI) of 45.0 to 49.9 in adult [facility])  Plan: REFERRAL TO ENDOCRINOLOGY ( INT)  - patient interested in discussing medical weight loss, referred to endocrinology- prefers with dr Allyne Gee who already sees her for hyperprolactinemia    (E78.5) Hyperlipidemia, unspecified hyperlipidemia type  Plan:   - discussed benefits/risks of starting statin, most people do stay on for life, can lower risk of heart attack/stroke  - patient agreeable to start taking, however if loses weight  Would like to reassess  - discussed if develops muscle aches or new sx to stop using and call us    Orders Placed This Encounter      REFERRAL TO ENDOCRINOLOGY ( INT)          Referral Priority:Routine          Referral Type:Consult and Treat          Referral Reason:Specialty Services Required          Number of Visits Requested:3      atorvastatin (LIPITOR) 40 MG tablet          Sig: Take 1 tablet by mouth daily          Dispense:  30 tablet          Refill:  11      fluticasone (FLONASE) 50 MCG/ACT nasal spray          Sig: 1 spray by Each Nostril route daily          Dispense:  1 Bottle          Refill:  0      albuterol (PROVENTIL HFA,VENTOLIN HFA, PROAIR HFA) 108 (90 Base) MCG/ACT inhaler          Sig: Inhale 2 puffs into the lungs every 4 (four) hours as needed for Wheezing or Shortness of breath          Dispense:  1 Inhaler          Refill:  1     We discussed the patients current medications. The patient expressed understanding and no barriers to adherence were identified.         follow up 1-2 weeks asthma recheck, mood (did not discuss anxiety at length today- asked pt to rtc for this)    Starsky Nanna Fransisco Hertz, PA-C

## 2017-07-18 ENCOUNTER — Encounter (HOSPITAL_BASED_OUTPATIENT_CLINIC_OR_DEPARTMENT_OTHER): Payer: Self-pay | Admitting: Physician Assistant

## 2017-07-20 ENCOUNTER — Telehealth (HOSPITAL_BASED_OUTPATIENT_CLINIC_OR_DEPARTMENT_OTHER): Payer: Self-pay

## 2017-07-20 ENCOUNTER — Encounter (HOSPITAL_BASED_OUTPATIENT_CLINIC_OR_DEPARTMENT_OTHER): Payer: Self-pay

## 2017-07-20 NOTE — Progress Notes (Signed)
Returned pt's call about her recent prolactin level (low normal). Left vm on home number.   Reached pt on cell number. Discussed prolactin level is at goal on cabergoline 0.25 mg once a week. I recommend continuing on the current dose of carbergoline and seeing what the MRI shows next month. We will adjust the dose as needed after those results.     She prefers me to call her with any results rather than the nurse.     Otherwise she feels well.     We discussed extremely rare possibility that this is cancer and that if tumor is not increasing in size or causing hormone disruption, then would defer surgery. She expressed her understanding and all questions were answered.

## 2017-07-20 NOTE — Telephone Encounter (Signed)
-----   Message from Norva Pavlov sent at 07/20/2017 11:03 AM EDT -----  Regarding: Dr. Allyne Gee patient  [place] [id number], 47 year old, female, Telephone Information:  Home Phone      [phone]  Work Phone      [phone]  Mobile          [phone]      Patient's Preferred Pharmacy:     [facility], [place]  Phone: [phone] Fax: [phone]      CONFIRMED TODAY: [name] NUMBER: [phone]    Best time to call back: anytime  Cell phone:   Other phone:    Available times:    Patient's language of care: English    Patient does not need an interpreter.    Patient's PCP: Janann August, MD    Person calling on behalf of patient: Patient (self)    Calls today to speak to provider only.    Pt would like to speak with Dr. Allyne Gee only not the nurse about her labs. Pls call.

## 2017-07-20 NOTE — Progress Notes (Signed)
Forwarding to Dr. Allyne Gee per pt's request

## 2017-07-27 ENCOUNTER — Ambulatory Visit (HOSPITAL_BASED_OUTPATIENT_CLINIC_OR_DEPARTMENT_OTHER): Payer: PRIVATE HEALTH INSURANCE

## 2017-08-04 ENCOUNTER — Ambulatory Visit (HOSPITAL_BASED_OUTPATIENT_CLINIC_OR_DEPARTMENT_OTHER): Payer: PRIVATE HEALTH INSURANCE

## 2017-08-06 ENCOUNTER — Ambulatory Visit (HOSPITAL_BASED_OUTPATIENT_CLINIC_OR_DEPARTMENT_OTHER): Payer: No Typology Code available for payment source

## 2017-08-24 ENCOUNTER — Other Ambulatory Visit (HOSPITAL_BASED_OUTPATIENT_CLINIC_OR_DEPARTMENT_OTHER): Payer: Self-pay | Admitting: Social Worker

## 2017-08-24 NOTE — Progress Notes (Signed)
Thank you for your referral. We were able to contact your patient. she was given an appointment for 09/15/17 with Liz Beach at [facility].

## 2017-08-25 ENCOUNTER — Ambulatory Visit
Admission: RE | Admit: 2017-08-25 | Discharge: 2017-08-25 | Disposition: A | Discharge status: Home / Self Care | Payer: No Typology Code available for payment source | Attending: "Endocrinology | Admitting: "Endocrinology

## 2017-08-25 ENCOUNTER — Other Ambulatory Visit (HOSPITAL_BASED_OUTPATIENT_CLINIC_OR_DEPARTMENT_OTHER): Payer: Self-pay | Admitting: "Endocrinology

## 2017-08-25 ENCOUNTER — Ambulatory Visit: Payer: Self-pay | Admitting: Physician Assistant

## 2017-08-25 ENCOUNTER — Ambulatory Visit: Payer: Self-pay | Admitting: "Endocrinology

## 2017-08-25 DIAGNOSIS — G935 Compression of brain: Secondary | ICD-10-CM

## 2017-08-25 DIAGNOSIS — E221 Hyperprolactinemia: Secondary | ICD-10-CM | POA: Diagnosis not present

## 2017-08-25 MED ORDER — GADOTERIDOL 279.3 MG/ML IV SOLN
1.00 mL | Freq: Once | INTRAVENOUS | Status: AC
Start: 2017-08-25 — End: 2017-08-25
  Administered 2017-08-25: 20 mL via INTRAVENOUS

## 2017-08-25 MED ORDER — NORMAL SALINE FLUSH 0.9 % IV SOLN
20.00 mL | Freq: Once | INTRAVENOUS | Status: AC
Start: 2017-08-25 — End: 2017-08-25
  Administered 2017-08-25: 20 mL via INTRAVENOUS

## 2017-08-26 ENCOUNTER — Telehealth (HOSPITAL_BASED_OUTPATIENT_CLINIC_OR_DEPARTMENT_OTHER): Payer: Self-pay

## 2017-08-26 NOTE — Progress Notes (Signed)
Called patient  Advised MRI results are not available yet  May be avail by appt time tomorrow  She voiced understanding

## 2017-08-26 NOTE — Telephone Encounter (Signed)
-----   Message from [facility] sent at 08/26/2017  9:13 AM EDT -----  Regarding: Dr Allyne Gee  Contact: [phone]  [name] [id number], 47 year old, female, Telephone Information:  Home Phone      [phone]  Work Phone      [phone]  Mobile          [phone]      Patient's Preferred Pharmacy:     [facility], [place]  Phone: [phone] Fax: [phone]      CONFIRMED TODAY: Alejandra Martin NUMBER: [phone]  Best time to call back: Anytime  Cell phone:   Other phone:    Available times:    Patient's language of care: English    Patient does not need an interpreter.    Patient's PCP: Janann August, MD    Person calling on behalf of patient: Patient (self)    Calls today about result of MRI pt has done yesterday.please advice.pt has F/up appointment tomorrow at 2pm.Thank you.

## 2017-08-27 ENCOUNTER — Ambulatory Visit: Payer: No Typology Code available for payment source | Attending: Physician Assistant | Admitting: "Endocrinology

## 2017-08-27 ENCOUNTER — Encounter (HOSPITAL_BASED_OUTPATIENT_CLINIC_OR_DEPARTMENT_OTHER): Payer: Self-pay | Admitting: "Endocrinology

## 2017-08-27 VITALS — BP 127/81 | HR 98 | Wt 286.0 lb

## 2017-08-27 DIAGNOSIS — R232 Flushing: Secondary | ICD-10-CM | POA: Diagnosis present

## 2017-08-27 MED ORDER — CABERGOLINE 0.5 MG PO TABS
0.2500 mg | ORAL_TABLET | ORAL | 3 refills | Status: DC
Start: 2017-08-27 — End: 2017-10-27

## 2017-08-27 MED ORDER — CABERGOLINE 0.5 MG PO TABS: 0 mg | tablet | ORAL | 3 refills | 0 days | Status: DC

## 2017-08-27 NOTE — Progress Notes (Signed)
The patient is a 47 year old female with history of microprolactinoma here for follow up. She was last seen in 04/2017.     HPI: We initially learned of this patient through an e-consult. She presented with oligomenorrhea for 1 year. Prior to then, she had monthly menses for the most part. 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 Her TSH, testosterone, FSH, and estradiol levels were normal. 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 There is no mention of optic nerve involvement, but there is deviation of the stalk to the left.   Denies galactorrhea or breast tenderness. There were no gross neurological deficits on exam.    Stress: Father passed 4 years ago. Mother went on dialysis 2 years ago. She is turning 48  Years old.   Marijuana: none  ETOH: none  Chest tattoos: none  Chest pain: none  Headaches: occasional left temporal. Resolves with tylenol.   Menarche: age 11  Periods: regular  G2P1AB1  Her daughter is age 17 years. She conceived after 10 years of trying. She was never told her prolactin was elevated in the past.     I started cabergoline 0.25 mg twice a week (Mondays and Thursdays) and her prolactin decreased into the normal range. In addition, full pituitary hormone evaluation was completed and negative for other pituitary hormone excess or deficiency. Periods have resumed and are monthly. Cramping is much less and overall every back to baseline.     Interim history: was seen in ED and also by her PCP for depression and anxiety. Now on treatment. Mood has improved. Repeat prolactin level in May was low end of normal. I decreased the cabergoline to 0.25 mg weekly. She repeated her 1 year pituitary MRI and is here to review the results.     Today, she reports she is compliant with taking the medication. No nausea. No headaches or change vision. No breast pain or galactorrhea. She reports her last period was only spotting. Not sure if  the stress caused it to be lighter. She has been caring for her mother who is turning 67. She also noticed hot flashes starting 2 weeks ago. Her mother went through menopause in her early 31s.         Problem History:  Patient Active Problem List:     Allergy, unspecified not elsewhere classified     Acute gastritis without mention of hemorrhage     Intestinal infection due to Clostridium difficile     Pure hypercholesterolemia     Major depressive disorder, recurrent episode, moderate with anxious distress (H[facility])     Generalized anxiety disorder     Benign positional vertigo     Contraceptive management     Tubular adenoma of colon     GERD (gastroesophageal reflux disease)     Diarrhea     Cholecystitis     Epigastric abdominal pain     Mass of ampulla of Vater     Gastroesophageal reflux disease with esophagitis     Obesity     Colon polyp     Mucosal abnormality of duodenum     Abnormal LFTs     Allergic rhinitis due to pollen     Anisometropic amblyopia of right eye     Barrett's esophagus     Disorder of biliary tract     Gastroesophageal reflux disease     Mild intermittent asthma without complication     Prediabetes  Tinea pedis of both feet     Perimenopause     Hyperprolactinemia (HCC)     BMI 45.0-49.9, adult ([place])     Hyperlipidemia      Past Medical History:   Past Medical History:  06/12/2004: ACUTE GASTRITIS W/O HEMORRHAGE      Comment:  H. Pylori positive (done at [facility]); GI consult Dr.               Phillis Knack, TOC 10/01/04  03/26/2004: ALLERGY, UNSPECIFIED      Comment:  Seen by ENT, deviated septum, hypertrophy of inferior                nasal turbinates  No date: Anxiety  No date: Asthma  07/26/2004: CLOSTRIDIUM DIFFICILE      Comment:  GI cosult: Dr. Rollene Rotunda, TOC done on 10/01/04  No date: Elevated cholesterol      Comment:  resolved without meds  No date: Esophageal reflux  No date: Snores  No date: Stomach disease  No date: Unspecified asthma(493.90)  No date: Wears eyeglasses      Comment:   night driving    Active Medication:   Reviewed and updated.     Allergies:   Review of Patient's Allergies indicates:   Erythromycin            Itching   Erythromycin            Itching   Macrolides and keto*    Itching, Rash    Social history:  Social History     Socioeconomic History    Marital status: Married     Spouse name: Not on file    Number of children: Not on file    Years of education: Not on file    Highest education level: Not on file   Occupational History    Not on file   Social Needs    Financial resource strain: Not on file    Food insecurity:     Worry: Not on file     Inability: Not on file    Transportation needs:     Medical: Not on file     Non-medical: Not on file   Tobacco Use    Smoking status: Never Smoker    Smokeless tobacco: Never Used   Substance and Sexual Activity    Alcohol use: No    Drug use: No    Sexual activity: Yes     Partners: Male     Comment: men age 41 q 4-6 wks x 3d, small fibroid on u/s, 1st SA age 32, 2 lifetime partners, current x 8 yrs   Lifestyle    Physical activity:     Days per week: Not on file     Minutes per session: Not on file    Stress: Not on file   Relationships    Social connections:     Talks on phone: Not on file     Gets together: Not on file     Attends religious service: Not on file     Active member of club or organization: Not on file     Attends meetings of clubs or organizations: Not on file     Relationship status: Not on file    Intimate partner violence:     Fear of current or ex partner: Not on file     Emotionally abused: Not on file     Physically abused: Not on file  Forced sexual activity: Not on file   Other Topics Concern    Not on file   Social History Narrative    07/09/2005    To [name] from [name] with family at age 39, husband from [place]    Works [employer]    Pt thrilled with pregnancy-seeking pregnancy x 4 yrs, very anxious since sab 2 yrs ago    Has one child    Not  working now    Lives with husband and daughter    Alton Revere, MD, 05/13/2016, 1:50 PM   Accompanied by her sister. Unchanged.    Family History:   Review of patient's family history indicates:  Problem: OTHER      Relation: Father          Age of Onset: (Not Specified)          Comment: leukemia   Problem: Diabetes      Relation: Mother          Age of Onset: (Not Specified)  Problem: Hypertension      Relation: Mother          Age of Onset: (Not Specified)  Problem: Lipids      Relation: Mother          Age of Onset: (Not Specified)  Problem: OTHER      Relation: Mother          Age of Onset: (Not Specified)          Comment: ESRD on HD  Problem: Hypertension      Relation: Father          Age of Onset: (Not Specified)  Problem: Hypertension      Relation: Brother          Age of Onset: (Not Specified)  No history of pituitary disease. Unchanged.    Review of systems:  Rest of review of systems are negative except as stated in HPI.     Physical Exam:   BP 127/81  Pulse 98  Wt 129.7 kg (286 lb)  SpO2 97%  BMI 50.66 kg/m2  GEN: Appears well. Does not appear Cushingoid or acromegalic.   HEENT: EOMI, no ophthalmopathy  NECK: supple, no thyromegaly or thyroid nodules, no LAD.   NEURO: no tremor, DTR 2+ with nl relaxation phase.   SKIN: no acanthosis nigricans.     Most Recent Weight Reading(s)  08/27/17 : 129.7 kg (286 lb)  08/25/17 : 126.1 kg (278 lb)  07/17/17 : 130.1 kg (286 lb 12.8 oz)  06/29/17 : 129.8 kg (286 lb 3.2 oz)  06/22/17 : 129.7 kg (286 lb)    Laboratory:  Component      Latest Ref Rng & Units 02/09/2017   PROLACTIN      2.2 - 30.3 ng/mL 4.3     Component      Latest Ref Rng & Units 11/11/2016   ADRENOCORTICOTROPIC HORMONE      7.2 - 63.3 pg/mL 60.8   CORTISOL AM      4.30 - 22.40 ug/dL 13.68   FREE THYROXINE      0.76 - 1.46 ng/dL 0.78   SOMATOMEDIN C (IGF-1)      57 - 195 ng/mL 118     Component      Latest Ref Rng & Units 08/18/2016   THYROID SCREEN TSH REFLEX FT4      0.358 - 3.740 uIU/mL 2.410    PROLACTIN  2.2 - 30.3 ng/mL 154.0 (H)   FOLLICLE STIMULATING HORMONE      mIU/mL 8.0   LUTEINIZING HORMONE (LH)      mIU/ml    DHEA SULFATE      45 - 270 ug/dL    TSH (THYROID STIM HORMONE)      0.34 - 5.60 uIU/ml    ESTRADIOL      . 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    She is currently on treatment with cabergoline 0.25 mg once weekly and her last prolactin level was normal. Periods are regular except last one was only spotting. Also reports hot-flashes x 2 weeks. Also very fatigued, but likely due to stress over her mother recently. Will check TSH, prolactin, and FSH level. Farley may be normal in early perimenopause however.     I called radiology, Dr. Beverly Gust, to read the pituitary MRI since the result is not yet available. He informed me that the pituitary adenoma is no longer there. However, there is an enhancing mass somewhere else in the brain and he has sent the images to a neuroradiologist to review before signing the report. He says the mass appears stable in size compared to last year, but is enhancing more now. Likely need f/u MRI in 6 months. However, final report should be available within 24 hrs. I informed pt and will contact her once the report is final to decide on next steps.     The patient understands and agrees with the plan. All questions were answered.       Follow up in 6 months.     Richardson Landry, MD    This visit lasted 25 minutes with greater than 50% of the visit spent on counseling and coordination of care.

## 2017-08-27 NOTE — Addendum Note (Signed)
Addended by: Little Ishikawa on: 08/27/2017 02:56 PM     Modules accepted: Orders

## 2017-08-27 NOTE — Progress Notes (Signed)
Pt here for a follow up with Dr Allyne Gee

## 2017-08-27 NOTE — Patient Instructions (Signed)
1, Continue same dose of cabergoline.   2. Visit the lab today.   3. I will call you with MRI and lab results.

## 2017-08-28 ENCOUNTER — Telehealth (HOSPITAL_BASED_OUTPATIENT_CLINIC_OR_DEPARTMENT_OTHER): Payer: Self-pay

## 2017-08-28 LAB — PROLACTIN: PROLACTIN: 0.8 ng/mL — ABNORMAL LOW (ref 2.2–30.3)

## 2017-08-28 LAB — FOLLICLE STIMULATING HORMONE: FOLLICLE STIMULATING HORMONE: 15.8 m[IU]/mL

## 2017-08-28 LAB — THYROID SCREEN TSH REFLEX FT4: THYROID SCREEN TSH REFLEX FT4: 2.818 u[IU]/mL (ref 0.358–3.740)

## 2017-08-28 NOTE — Progress Notes (Signed)
[facility]            Pt anxious about test results.   1. Please inform pt that I am still waiting on the results of the brain MRI.   I will call her once they are available if today or Monday.   2. Her lab results show normal thyroid level.   3. Her prolactin level is low. Decrease cabergoline to only 1/2 tablet (0.25 mg) on MWF.   4. She is likely entering into perimenopause based on her hormone level.   5. I will call her to discuss these results and imaging results as well soon.   Thanks,   TG      Call placed to pt.  Message given. Pt. Is taking her  cabergoline only once weekly at this time.   I asked her to please discuss that with you when you return her call with the MRI results.

## 2017-08-31 ENCOUNTER — Telehealth (HOSPITAL_BASED_OUTPATIENT_CLINIC_OR_DEPARTMENT_OTHER): Payer: Self-pay | Admitting: "Endocrinology

## 2017-08-31 DIAGNOSIS — D352 Benign neoplasm of pituitary gland: Secondary | ICD-10-CM

## 2017-08-31 NOTE — Telephone Encounter (Signed)
-----   Message from [facility] sent at 08/31/2017 12:12 PM EDT -----  Regarding: RE: Dr Allyne Gee  Contact: [phone]  Pt. Calling... Missed your earlier  call.   Anxiously waiting by phone to hear results.  Alejandra Martin  ----- Message -----  From: Sunday Corn  Sent: 08/31/2017  11:40 AM  To: Ems Nurses Pool  Subject: Dr Allyne Gee                                      [facility]    Alejandra Martin [id number], 47 year old, female, Telephone Information:  Home Phone      [phone]  Work Phone      [phone]  Mobile          [phone]      Patient's Preferred Pharmacy:     [facility] [place], [place]  Phone: [phone] Fax: [phone]      CONFIRMED TODAY: Alejandra Martin NUMBER: [phone]    Best time to call back: Anytime  Cell phone:   Other phone:    Available times:    Patient's language of care: English    Patient does not need an interpreter.    Patient's PCP: Janann August, MD    Person calling on behalf of patient: Patient (self)    Calls today Returning phonecall for Doctor Allyne Gee.please call pt back.TXS

## 2017-08-31 NOTE — Progress Notes (Signed)
Called pt to review MRI and lab results. Left vm on cell and home to cb.

## 2017-08-31 NOTE — Telephone Encounter (Signed)
-----   Message from [facility] sent at 08/31/2017 12:12 PM EDT -----  Regarding: RE: Dr Allyne Gee  Contact: [phone]  Pt. Calling... Missed your earlier  call.   Anxiously waiting by phone to hear results.  [name]  ----- Message -----  From: Sunday Corn  Sent: 08/31/2017  11:40 AM  To: Ems Nurses Pool  Subject: Dr Allyne Gee                                      [facility]    Alejandra Martin [id number], 47 year old, female, Telephone Information:  Home Phone      [phone]  Work Phone      [phone]  Mobile          [phone]      Patient's Preferred Pharmacy:     [facility] [place], [place]  Phone: [phone] Fax: [phone]      CONFIRMED TODAY: Alejandra Martin NUMBER: [phone]    Best time to call back: Anytime  Cell phone:   Other phone:    Available times:    Patient's language of care: English    Patient does not need an interpreter.    Patient's PCP: Janann August, MD    Person calling on behalf of patient: Patient (self)    Calls today Returning phonecall for Doctor Allyne Gee.please call pt back.TXS

## 2017-08-31 NOTE — Progress Notes (Signed)
Called pt again (2nd attempt). Reached pt this time. Reviewed MRI result. 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 I reviewed this with our neurologist who felt this is tiny and most likely benign. They agree with repeat MRI in 6 months to evaluate for growth. I will also place formal neurology referral for monitoring of this tumor especially given that patient is anxious.     Regarding labs, prolactin is low, thus will decrease cabergoline to 1/2 table every other week. Will repeat prolactin in 2 months.    My staff will assist in scheduling brain MRI in 6 months and neurology appt. All questions answered and pt appreciative.

## 2017-09-01 ENCOUNTER — Other Ambulatory Visit (HOSPITAL_BASED_OUTPATIENT_CLINIC_OR_DEPARTMENT_OTHER): Payer: Self-pay | Admitting: "Endocrinology

## 2017-09-04 ENCOUNTER — Ambulatory Visit: Payer: No Typology Code available for payment source | Attending: Internal Medicine | Admitting: Internal Medicine

## 2017-09-04 VITALS — BP 110/78 | HR 98 | Temp 98.0°F | Wt 288.0 lb

## 2017-09-04 DIAGNOSIS — R51 Headache: Secondary | ICD-10-CM | POA: Insufficient documentation

## 2017-09-04 DIAGNOSIS — T7840XD Allergy, unspecified, subsequent encounter: Secondary | ICD-10-CM | POA: Diagnosis present

## 2017-09-04 DIAGNOSIS — Z6841 Body Mass Index (BMI) 40.0 and over, adult: Secondary | ICD-10-CM | POA: Insufficient documentation

## 2017-09-04 DIAGNOSIS — R519 Headache, unspecified: Secondary | ICD-10-CM

## 2017-09-04 DIAGNOSIS — J301 Allergic rhinitis due to pollen: Secondary | ICD-10-CM | POA: Insufficient documentation

## 2017-09-04 DIAGNOSIS — R0683 Snoring: Secondary | ICD-10-CM | POA: Insufficient documentation

## 2017-09-04 DIAGNOSIS — J342 Deviated nasal septum: Secondary | ICD-10-CM | POA: Diagnosis present

## 2017-09-04 MED ORDER — CETIRIZINE HCL 10 MG PO CHEW
10.0000 mg | CHEWABLE_TABLET | Freq: Every day | ORAL | 1 refills | Status: DC
Start: 2017-09-04 — End: 2017-10-27

## 2017-09-04 MED ORDER — SALINE NASAL SPRAY 0.65 % NA SOLN
1.00 | NASAL | 3 refills | Status: AC | PRN
Start: 2017-09-04 — End: 2017-11-03

## 2017-09-04 MED ORDER — CETIRIZINE HCL 10 MG PO CHEW: 10 mg | tablet | Freq: Every day | ORAL | 1 refills | 0 days | Status: DC

## 2017-09-04 MED ORDER — SALINE NASAL SPRAY 0.65 % NA SOLN: 1 | mL | NASAL | 3 refills | 0 days | Status: AC | PRN

## 2017-09-04 NOTE — Progress Notes (Signed)
Pt of dr finger    Notes for 3-4 days, has headache on the left side of her head, (parietal, occipital)  Feels pressure left front face/sinus   Feels very tired when this happens  Keeps pointing to the side of her head as where the pressure is  Head a little bit sore when she touches is, feles liek headaches like when your'e going to get your period  No other body changes, tingling, or weakness  No trouble talking or walking    When she took a sinus med, it helped all of it a bit (pseudaphed)  Took some ibupofen last night too  Feels those helped  Notes she has "prolactin" and they did an MRI - and the prolactinoma is gone but 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 Had it 1-2 months ago. Maybe even 4- 5 years ago, when her dad passed away. Usually just lasts 1-2 days, and takes some tylenol and it goes away    Not taking flonase as it causes a headache    ues the albuterol as needed, this week has used it only once  Usually 0-1 times a week    Notes she has does snore, for 5-6 years  Husband tells her she snores    No fevers  Feels a bit of runny nose        Patient Active Problem List:     Allergy, unspecified not elsewhere classified     Acute gastritis without mention of hemorrhage     Intestinal infection due to Clostridium difficile     Pure hypercholesterolemia     Major depressive disorder, recurrent episode, moderate with anxious distress (HCC)     Generalized anxiety disorder     Benign positional vertigo     Contraceptive management     Tubular adenoma of colon     GERD (gastroesophageal reflux disease)     Diarrhea     Cholecystitis     Epigastric  abdominal pain     Mass of ampulla of Vater     Gastroesophageal reflux disease with esophagitis     Obesity     Colon polyp     Mucosal abnormality of duodenum     Abnormal LFTs     Allergic rhinitis due to pollen     Anisometropic amblyopia of right eye     Barrett's esophagus     Disorder of biliary tract     Gastroesophageal reflux disease     Mild intermittent asthma without complication     Prediabetes     Tinea pedis of both feet     Perimenopause     Hyperprolactinemia (HCC)     BMI 45.0-49.9, adult (HCC)     Hyperlipidemia     ERRONEOUS ENCOUNTER--DISREGARD    BP 110/78  Pulse 98  Temp 98 F (36.7 C)  Wt 130.6 kg (288 lb)  LMP 08/01/2017 (Approximate)  SpO2 94%  BMI 51.02 kg/m2  Pain Score: Data Unavailable      GEN: pleasant, comfortable  HEENT: anicteric,  no injection, OP nl, TMs without erythema or bulge, + trace fluid  + nasal deviation  ABD: obese  EXTREM: wwp,  NEURO: A+Ox3,nl gross strength, nl gait  CN II-XII intact  Mild ttp over left side of head, espec posterior/occiput  PSYCH: nl affect    A/P  (R51) Headache disorder  (primary encounter diagnosis)  Likely multifactorial: allergies/sinus, stress, possible OSA component as well  Less likely realted to abnl mri, but message sent to dr Clare Charon and fingers to confirm/keep in loop  Fluids, add zyrtec to claritin, ok to take otc sudafed, ibuprofen as tolerated -   Ibuprofen 200/400 mg twice a day with food (be careful of bothering stomach)  Nasal saline spray    (T78.40XD) Allergic state, subsequent encounter  (J30.1) Seasonal allergic rhinitis due to pollen  Add zyrtec to claritin, saline nasal spray      (Z68.42) BMI 45.0-49.9, adult (HCC)  (R06.83) Snoring  (J34.2) Deviated nasal septum- identified   Possible osa possibly cvonirubting to HA, fatigue  Dsicsused/education re OSA, risks over time  She wants to think about it  Reviewed long term effects on cp system, brain, renal system  She will consdier ity, encourged her to discuss with her  pcp    Given many appointmnetns and issues , encouraged appt with pcp in 6-8 wekes to help coordinate care  She agreed  Will set up appt

## 2017-09-05 ENCOUNTER — Other Ambulatory Visit (HOSPITAL_BASED_OUTPATIENT_CLINIC_OR_DEPARTMENT_OTHER): Payer: Self-pay | Admitting: Physician Assistant

## 2017-09-05 DIAGNOSIS — K219 Gastro-esophageal reflux disease without esophagitis: Secondary | ICD-10-CM

## 2017-09-05 DIAGNOSIS — K2270 Barrett's esophagus without dysplasia: Secondary | ICD-10-CM

## 2017-09-05 NOTE — Progress Notes (Unsigned)
PER Pharmacy, Alejandra Martin is a 47 year old female has requested a refill of omeprazole.       Last Office Visit: 09/04/2017 with Dorothyann Gibbs [place]     There are no preventive care reminders to display for this patient.     Other Med Adult:  Most Recent BP Reading(s)  09/04/17 : 110/78        Cholesterol (mg/dL)   Date Value   07/03/2017 267 (*H)     LOW DENSITY LIPOPROTEIN DIRECT (mg/dL)   Date Value   07/03/2017 196 (*H)     HIGH DENSITY LIPOPROTEIN (mg/dL)   Date Value   07/03/2017 52     TRIGLYCERIDES (mg/dL)   Date Value   07/03/2017 165 (H)         THYROID SCREEN TSH REFLEX FT4 (uIU/mL)   Date Value   08/27/2017 2.818         TSH (THYROID STIM HORMONE) (uIU/ml)   Date Value   06/28/2004 1.41       HEMOGLOBIN A1C (%)   Date Value   07/03/2017 5.6       No results found for: POCA1C      INR (no units)   Date Value   04/28/2008 1.0 (L)   09/24/2006 1.0 (L)       SODIUM (mmol/L)   Date Value   07/03/2017 142       POTASSIUM (mmol/L)   Date Value   07/03/2017 3.9           CREATININE (mg/dL)   Date Value   07/03/2017 0.8        Documented patient preferred pharmacies:    [facility] [place], [place] [address] [facility] [address]  Phone: [phone] Fax: [phone]

## 2017-09-07 ENCOUNTER — Ambulatory Visit (HOSPITAL_BASED_OUTPATIENT_CLINIC_OR_DEPARTMENT_OTHER): Payer: No Typology Code available for payment source

## 2017-09-09 ENCOUNTER — Emergency Department
Admission: AD | Admit: 2017-09-09 | Discharge: 2017-09-09 | Disposition: A | Discharge status: Home / Self Care | Payer: No Typology Code available for payment source | Source: Intra-hospital | Attending: Emergency Medicine | Admitting: Emergency Medicine

## 2017-09-09 ENCOUNTER — Encounter (HOSPITAL_BASED_OUTPATIENT_CLINIC_OR_DEPARTMENT_OTHER): Payer: Self-pay

## 2017-09-09 DIAGNOSIS — R51 Headache: Secondary | ICD-10-CM | POA: Diagnosis not present

## 2017-09-09 DIAGNOSIS — J45909 Unspecified asthma, uncomplicated: Secondary | ICD-10-CM | POA: Diagnosis not present

## 2017-09-09 DIAGNOSIS — R519 Headache, unspecified: Secondary | ICD-10-CM

## 2017-09-09 MED ORDER — KETOROLAC TROMETHAMINE 30 MG/ML INJ
30.0000 mg | Freq: Once | Status: AC
Start: 2017-09-09 — End: 2017-09-09
  Administered 2017-09-09: 30 mg via INTRAMUSCULAR
  Filled 2017-09-09: qty 1

## 2017-09-09 MED ORDER — IBUPROFEN 600 MG PO TABS
600.0000 mg | ORAL_TABLET | Freq: Three times a day (TID) | ORAL | 0 refills | Status: AC | PRN
Start: 2017-09-09 — End: 2017-09-14

## 2017-09-09 MED ORDER — ACETAMINOPHEN 500 MG PO TABS
1000.0000 mg | ORAL_TABLET | Freq: Once | ORAL | Status: AC
Start: 2017-09-09 — End: 2017-09-09
  Administered 2017-09-09: 1000 mg via ORAL
  Filled 2017-09-09: qty 2

## 2017-09-09 MED ORDER — IBUPROFEN 600 MG PO TABS: 600 mg | tablet | Freq: Three times a day (TID) | ORAL | 0 refills | 0 days | Status: AC | PRN

## 2017-09-09 NOTE — Narrator Note (Signed)
Patient Disposition    Patient education for diagnosis, medications, activity, diet and follow-up.  Patient left ED 10:18 PM.  Patient rep received written instructions.  Interpreter to provide instructions: No    Patient belongings with patient: YES    Have all existing LDAs been addressed? N/A    Have all IV infusions been stopped? N/A    Discharged to: Discharged to home

## 2017-09-09 NOTE — ED Triage Note (Signed)
Pt c/o left-sided headache x 1 week.  Pt states she saw her PCP last Friday who prescribed nasal spray and allergy pill, but has not been helping.  Pt states she had MRI of brain earlier this month, reports they found a benign tumor but pt has been feeling a little stressed about it.  Pt denies any recent falls/head trauma.  Pt also denies any nausea, vomiting, or fevers.

## 2017-09-09 NOTE — Discharge Instructions (Addendum)
You were seen in the emergency department for headache.  Your exam was reassuring.  Your symptoms may be due to a tension headache or migraine headache.  Please take the prescribed ibuprofen as needed for recurrent headaches.  Follow-up with your primary care doctor in 1 week for reevaluation.    Return to the emergency room if you develop weakness or numbness, fevers, neck stiffness, or any other concerning symptoms

## 2017-09-09 NOTE — ED Provider Notes (Signed)
The patient was seen primarily by me. ED nursing record was reviewed. Select prior records as available electronically through the Epic record were reviewed.      HPI:    Alejandra Martin is a 47 year old female patient with past medical history of gastritis, anxiety, asthma, who presents for evaluation of headache.  The patient reports over the past 1 week she has had recurrent pressure-like headaches over her left head.  The headaches come on gradually and go away on their own.  She has associated photophobia.  Nothing makes the pain better or worse.  She denies any falls or trauma.  She denies any associated fevers or chills, neck pain, neck stiffness, chest pain, shortness of breath, abdominal pain, nausea or vomiting.  She denies any weakness or numbness.  She has not tried taking any medications for the headache today.    ROS: Pertinent positives were reviewed as per the HPI above.    Specifically, positive for headache, photophobia    All other systems were reviewed and are negative.    Jannet Mantis  Language of care: English  MRN: [id number]  PCP: Janann August, MD  Mode of arrival to ED: Self.  Arrival time:     Chief complaint: Headache    Past Medical History/Problem list:  Past Medical History:  06/12/2004: ACUTE GASTRITIS W/O HEMORRHAGE      Comment:  H. Pylori positive (done at [facility]); GI consult Dr.               Phillis Knack, TOC 10/01/04  03/26/2004: ALLERGY, UNSPECIFIED      Comment:  Seen by ENT, deviated septum, hypertrophy of inferior                nasal turbinates  No date: Anxiety  No date: Asthma  07/26/2004: CLOSTRIDIUM DIFFICILE      Comment:  GI cosult: Dr. Rollene Rotunda, TOC done on 10/01/04  No date: Elevated cholesterol      Comment:  resolved without meds  No date: Esophageal reflux  No date: Snores  No date: Stomach disease  No date: Unspecified asthma(493.90)  No date: Wears eyeglasses      Comment:  night driving  Patient Active Problem List:     Allergy, unspecified not elsewhere  classified     Acute gastritis without mention of hemorrhage     Intestinal infection due to Clostridium difficile     Pure hypercholesterolemia     Major depressive disorder, recurrent episode, moderate with anxious distress (HCC)     Generalized anxiety disorder     Benign positional vertigo     Contraceptive management     Tubular adenoma of colon     GERD (gastroesophageal reflux disease)     Diarrhea     Cholecystitis     Epigastric abdominal pain     Mass of ampulla of Vater     Gastroesophageal reflux disease with esophagitis     Obesity     Colon polyp     Mucosal abnormality of duodenum     Abnormal LFTs     Allergic rhinitis due to pollen     Anisometropic amblyopia of right eye     Barrett's esophagus     Disorder of biliary tract     Gastroesophageal reflux disease     Mild intermittent asthma without complication     Prediabetes     Tinea pedis of both feet     Perimenopause     Hyperprolactinemia (  [place])     BMI 45.0-49.9, adult ([place])     Hyperlipidemia     ERRONEOUS ENCOUNTER--DISREGARD    Past Surgical History: Past Surgical History:  No date: CHOLECYSTECTOMY  No date: NO SIGNIFICANT SURGICAL HISTORY  No date: OB ANTEPARTUM CARE CESAREAN DLVR & POSTPARTUM  Social History: Alcohol:No  Tobacco:No  Cocaine/Heroin/Methamphetamine:No   Allergies: Review of Patient's Allergies indicates:   Erythromycin            Itching   Erythromycin            Itching   Macrolides and keto*    Itching, Rash    Immunizations:   Immunization History   Administered Date(s) Administered    HEP B ADULT 3 DOSE 20 and > 04/05/2004, 05/03/2004, 10/07/2004    INFLUENZA VIRUS VAC QUAD LIVE INTRANASAL 2-<36YRS 12/27/2008, 01/02/2010, 11/25/2012    Influenza Virus Quad Presv Free Vacc 3/> Yrs IM 03/17/2017    Influenza Virus Tri Presv Free 3/> YRS IM 01/20/2006    Pneumococcal,NOS 01/02/2010    Td 04/03/2004, 02/21/2009    Tdap (adacel) 08/05/2012     Family Hx: no history of significant inheritable disease,   CAD: No  DM: No      Triage Vital Signs:   ED Triage Vitals [09/09/17 2049]   ED Triage Vitals Brief Group      Temp 98.5 F      Pulse 87      Resp 20      BP 126/77      SpO2 97 %      Pain Score 6        Medications:  Prior to Admission Medications   Prescriptions Last Dose Informant Patient Reported? Taking?   albuterol (PROVENTIL HFA,VENTOLIN HFA, PROAIR HFA) 108 (90 Base) MCG/ACT inhaler   No No   Sig: Inhale 2 puffs into the lungs every 4 (four) hours as needed for Wheezing or Shortness of breath   albuterol (PROVENTIL) (2.5 MG/3ML) 0.083% nebulizer solution   No No   Sig: Take 3 mLs by nebulization every 6 (six) hours as needed for Wheezing   atorvastatin (LIPITOR) 40 MG tablet   No No   Sig: Take 1 tablet by mouth daily   cabergoline (DOSTINEX) 0.5 MG tablet   No No   Sig: Take 0.5 tablets by mouth every 7 days   cetirizine (ZYRTEC) 10 MG chewable tablet   No No   Sig: Take 1 tablet by mouth daily   loratadine (CLARITIN) 10 MG tablet   No Yes   Sig: Take 1 tablet by mouth daily   omeprazole (PRILOSEC) 40 MG capsule   No No   Sig: Take 1 capsule by mouth daily   sertraline (ZOLOFT) 25 MG tablet   No No   Sig: Take 1 tablet by mouth daily for 7 days   sodium chloride (OCEAN) 0.65 % nasal spray   No No   Sig: 1 spray by Each Nostril route as needed for Congestion      Facility-Administered Medications: None     Physical Exam:   GENERAL: No acute distress, non-toxic     HEAD: No evidence of trauma.   Sclerae anicteric.   Normal posterior oropharynx. Moist mucous membranes.   Pupils equal & reactive, extraocular movements intact.   TMs clear.    NECK: Supple. No stridor.    LUNGS: Clear to auscultation bilaterally. No wheezes, rales, rhonchi.     HEART: Regular rate & rhythm.  No  murmurs appreciated.      ABDOMEN: Soft, nontender. No masses appreciated.  No involuntary guarding or rebound tenderness.     MUSCULOSKELETAL/EXTREMITIES: No obvious deformities or trauma.  Warm and well perfused.  No edema.      SKIN: Warm and dry, no  rash    GENITOURINARY: No CVA tenderness.     NEUROLOGIC: Alert and oriented x 3. Speaking in clear fluent sentences. CNII-CNXII grossly intact, normal finger to nose and rapid alternating movements, no pronator drift, 5/5 strength bilaterally, sensation intact to light touch bilaterally, normal gait without ataxia    PSYCHIATRIC: Appropriate, cooperative.       Medications Given in the ED:  Medications   acetaminophen (TYLENOL) tablet 1,000 mg (1,000 mg Oral Given 09/09/17 2128)   ketorolac (TORADOL) injection 30 mg (30 mg Intramuscular Given 09/09/17 2129)    Radiology Results:  N/A   Lab Results :  No results found for this visit on 09/09/17 (from the past 24 hour(s)). Other Results (e.g. ECG):  N/A     ED Course and Medical Decision-making:  47 year old female patient with past medical history of gastritis, anxiety, asthma, who presents for evaluation of headache.  On exam she is well-appearing with a reassuring vital signs and a reassuring neurological exam.  No concern for SAH or meningitis.  She was treated with acetaminophen and Toradol.  On reevaluation her symptoms improved, headache has almost completely resolved.  Symptoms may be due to tension headache or less likely migraine headache.  She was given return precautions and instructed to follow-up with primary care within 1 week for reevaluation.  She expressed understanding and agreement with this plan.  She was discharged home in improved condition.    Patient educated on diagnosis; she states understanding and agrees with plan of care.  Reasons to return to the ED were reviewed in detail. She agrees with this plan and disposition.    Diagnosis/Diagnoses:  Acute nonintractable headache, unspecified headache type    Christin Bach, MD  09/10/2017   [facility] [place]    This Emergency Department patient encounter note was created using voice-recognition software and in real time during the ED visit. Please excuse any  typographical errors that have not yet been reviewed and corrected.

## 2017-09-11 ENCOUNTER — Ambulatory Visit (HOSPITAL_BASED_OUTPATIENT_CLINIC_OR_DEPARTMENT_OTHER): Payer: No Typology Code available for payment source

## 2017-09-15 ENCOUNTER — Ambulatory Visit (HOSPITAL_BASED_OUTPATIENT_CLINIC_OR_DEPARTMENT_OTHER): Payer: No Typology Code available for payment source

## 2017-09-17 ENCOUNTER — Ambulatory Visit: Payer: No Typology Code available for payment source | Attending: "Endocrinology | Admitting: Neurology

## 2017-09-17 ENCOUNTER — Telehealth (HOSPITAL_BASED_OUTPATIENT_CLINIC_OR_DEPARTMENT_OTHER): Payer: Self-pay

## 2017-09-17 ENCOUNTER — Inpatient Hospital Stay (HOSPITAL_BASED_OUTPATIENT_CLINIC_OR_DEPARTMENT_OTHER)
Admission: RE | Admit: 2017-09-17 | Discharge: 2017-09-17 | Disposition: A | Discharge status: Home / Self Care | Payer: Self-pay | Source: Ambulatory Visit

## 2017-09-17 ENCOUNTER — Encounter (HOSPITAL_BASED_OUTPATIENT_CLINIC_OR_DEPARTMENT_OTHER): Payer: Self-pay | Admitting: Neurology

## 2017-09-17 VITALS — BP 135/83 | HR 106 | Wt 286.0 lb

## 2017-09-17 DIAGNOSIS — E221 Hyperprolactinemia: Secondary | ICD-10-CM | POA: Insufficient documentation

## 2017-09-17 DIAGNOSIS — E7800 Pure hypercholesterolemia, unspecified: Secondary | ICD-10-CM | POA: Insufficient documentation

## 2017-09-17 DIAGNOSIS — F331 Major depressive disorder, recurrent, moderate: Secondary | ICD-10-CM | POA: Diagnosis present

## 2017-09-17 DIAGNOSIS — D352 Benign neoplasm of pituitary gland: Secondary | ICD-10-CM | POA: Insufficient documentation

## 2017-09-17 DIAGNOSIS — R9089 Other abnormal findings on diagnostic imaging of central nervous system: Secondary | ICD-10-CM | POA: Insufficient documentation

## 2017-09-17 DIAGNOSIS — F419 Anxiety disorder, unspecified: Secondary | ICD-10-CM | POA: Diagnosis present

## 2017-09-17 NOTE — Progress Notes (Signed)
Pt. No show for ETT

## 2017-09-17 NOTE — Progress Notes (Signed)
Neurology Consultation    Alejandra Martin is a 47 year old right-handed female, sent for consultation by Janann August, MD for evaluation of abnormal brain MRI.     HPI :    Alejandra Martin has a history of initial amenorrhea, anxiety, hyperlipidemia, elevated A1c, perimenopause, obesity, asthma, with findings of the pituitary adenoma, here for evaluation of an abnormal MRI she was first seen by endocrinology in.  September 2018, with oligomenorrhea.  Findings included hyperprolactinemia, with TSH, testosterone, FSH, and estradiol levels being normal.  MRI of the brain initially, done 08/2016, noted a rounded focus of enhancement in the pituitary, said to be "8.3 x 4.6 x 6.5 mm, with some deviation of the stalk to the left, but without any suprasellar extension.  She was treated with cabergoline, and did well, prolactin decreasing into the normal range.  Repeat MRI done July 2019, on report, notes a 8 x 6 x 6.2 mm focus of low signal corresponding to high T2 signal, and the clivus abutting the sella.  This showed intense gadolinium enhancement.  Although on the first MRI, no abnormality in the clivus was noted on report, this describes this clival lesion as being unchanged.  However, enhancement is said to be slightly more intense.  Findings of 7 mm of cerebellar tonsils below the foramen magnum is  also indicated on this follow-up exam, the pituitary is noted to be normal, with normal homogeneous enhancement.  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  Today, Alejandra. 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  She also describes over the past 2 weeks having more frequent headaches, which are posterior, left sided, with a sense of pressure.  She denies having significant photophobia, or photophobia, but she does say she has not been  sleeping well, she has been extremely anxious since having this MRI, and she is premenstrual, having been told that she is perimenopausal.  When she started having some menstrual bleeding her headache improved significantly, and has not been associated with any neurological signs or symptoms.      Past medical history:  Patient Active Problem List:     Allergy, unspecified not elsewhere classified     Acute gastritis without mention of hemorrhage     Intestinal infection due to Clostridium difficile     Pure hypercholesterolemia     Major depressive disorder, recurrent episode, moderate with anxious distress (HCC)     Generalized anxiety disorder     Benign positional vertigo     Contraceptive management     Tubular adenoma of colon     GERD (gastroesophageal reflux disease)     Diarrhea     Cholecystitis     Epigastric abdominal pain     Mass of ampulla of Vater     Gastroesophageal reflux disease with esophagitis     Obesity     Colon polyp     Mucosal abnormality of duodenum     Abnormal LFTs     Allergic rhinitis due to pollen     Anisometropic amblyopia of right eye     Barrett's esophagus     Disorder of biliary tract     Gastroesophageal reflux disease     Mild intermittent asthma without complication     Prediabetes     Tinea pedis of both  feet     Perimenopause     Hyperprolactinemia (HCC)     BMI 45.0-49.9, adult (HCC)     Hyperlipidemia     ERRONEOUS ENCOUNTER--DISREGARD      Current Outpatient Medications   Medication Sig    omeprazole (PRILOSEC) 40 MG capsule Take 1 capsule by mouth daily    sodium chloride (OCEAN) 0.65 % nasal spray 1 spray by Each Nostril route as needed for Congestion    cetirizine (ZYRTEC) 10 MG chewable tablet Take 1 tablet by mouth daily    cabergoline (DOSTINEX) 0.5 MG tablet Take 0.5 tablets by mouth every 7 days    atorvastatin (LIPITOR) 40 MG tablet Take 1 tablet by mouth daily    albuterol (PROVENTIL HFA,VENTOLIN HFA, PROAIR HFA) 108 (90 Base) MCG/ACT inhaler Inhale 2  puffs into the lungs every 4 (four) hours as needed for Wheezing or Shortness of breath    sertraline (ZOLOFT) 25 MG tablet Take 1 tablet by mouth daily for 7 days    loratadine (CLARITIN) 10 MG tablet Take 1 tablet by mouth daily    albuterol (PROVENTIL) (2.5 MG/3ML) 0.083% nebulizer solution Take 3 mLs by nebulization every 6 (six) hours as needed for Wheezing     No current facility-administered medications for this visit.        Review of Patient's Allergies indicates:   Erythromycin            Itching   Erythromycin            Itching   Macrolides and keto*    Itching, Rash    Review of patient's family history indicates:  Problem: OTHER      Relation: Father          Age of Onset: (Not Specified)          Comment: leukemia   Problem: Diabetes      Relation: Mother          Age of Onset: (Not Specified)  Problem: Hypertension      Relation: Mother          Age of Onset: (Not Specified)  Problem: Lipids      Relation: Mother          Age of Onset: (Not Specified)  Problem: OTHER      Relation: Mother          Age of Onset: (Not Specified)          Comment: ESRD on HD  Problem: Hypertension      Relation: Father          Age of Onset: (Not Specified)  Problem: Hypertension      Relation: Brother          Age of Onset: (Not Specified)      SH:  Residence: in  [place], married one child age 89. She is not working. reports that she has never smoked. She has never used smokeless tobacco. She reports that she does not drink alcohol or use drugs.    ROS: A detailed review of symptoms was completed.     EXAM: BP 135/83  Pulse 106  Wt 129.7 kg (286 lb)  SpO2 98%  BMI 50.66 kg/m2   General: Appears obese, very pleasant, very anxious.  Eyes: PERRLA, no icterus, no periorbital swelling, erythema  HEENT: atraumatic, normocephalic.   Neck: no lymphadenopathy, no carotid bruits. Movements generally full to active ROM. No significant tenderness.  CVS: S1, S2 +, no murmurs heard  Extremities:  distal pulses palpable, no  cyanosis, clubbing, or peripheral edema  Skin: no rash, warm, well perfused.  She has a number of scratches on her legs secondary to bug bites    Neurological Examination:  Mental status: Awake, alert and oriented Attentive to questions asked, and able to provide a history, stay on topic, and interact in conversation.   Speech clear, Language:  fluent, without paraphasias. . Comprehension for crossed midline commands intact.  . No neglect, apraxia or left-right confusion.   Cranial nerves: Pupils  equal, round and reactive to light. Extraocular movements full without nystagmus. No ptosis. Visual fields full to confrontation. Facial sensation normal in V1,V2,V3 distributions. Facial strength full and symmetric at rest and with movement.  Hearing intact . Palate elevates symmetrically. Tongue protrudes midline.  Shoulder shrug symmetric.  Motor: Normal bulk and tone in all extremities.   No pronator drift. Generally full strength in all extremities,  No atrophy, fasciculations.    Reflexes:        B   Tr  Brach  Pat  AJ       Toes  R   2----------------------->      Down  L    2----------------------->      Down  Clonus:none  Sensory: Intact to light touch, pinprick, .Movements:   No tremor, cogwheeling, or abnormal movements noted.  Coordination:  Finger-nose testing without dysmetria or ataxia bilaterally.   Stance and Gait: Gait is steady with normal base, posture and armswing. Can arise from a chair and begin walking without difficulty.  ROM joints:full    LABS AND IMAGING REVIEWED:     Exam: MRI of the sella turcica without and with intravenous contrast.    Previous exam: MRI of the sella from 08/06/2016.    Technique: Pre IV contrast: sagittal T1, axial FLAIR, axial T2, axial T1, axial gradient echo, coronal T1, coronal T2 and diffusion weighted. Post 20 cc of ProHance IV contrast: Dynamic coronal T1 and sagittal T1.    Findings:    Clivus: There is a 8 x 6 x 6.2 mm focus of low T1 signal with corresponding high  T2 signal in the clivus abutting the sella. The focus demonstrates intense uniform enhancement. The lesion size is unchanged. Enhancement is slightly more intense on today's examination.    Sella: The contours of the pituitary are smooth. There is normal homogeneous enhancement. The stalk is midline. There is no suprasellar mass.     Brain: There is no acute infarction, hemorrhage, edema or mass. The major vascular structures demonstrate normal flow voids. The ventricles are not dilated. There are no white matter changes.    The cerebellar tonsils are elongated and extend 7 mm below the foramen magnum. The visualized spinal cord is normal.    The orbits and visualized paranasal sinuses are normal.    Impression:  1. 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 However, there is imaging overlap of benign notochordal centimeters with chordomas. Therefore a follow-up MRI of the sella in 6 months is recommended.  2. No microadenoma.  3. Mild Chiari 1 malformation.      PROLACTIN 0.8  Low           ASSESSMENT AND PLAN:     As noted, Alejandra. Maciolek has a history of a pituitary adenoma, diagnosed when she presented with oligomenorrhea.  Initial studies in 2018 indicated an approximately 8 mm pituitary adenoma.  Follow-up study in 2019 indicates a smooth normal-appearing pituitary, however  an approximately 8 mm lesion in the clivus, that brightly enhances.  I discussed the case and asked our neuroradiologist,  to review the films.  He noted that within the pituitary there was an adenoma in 2018 which seems to have resolved on the 2019 scan.  Immediate below the adenoma was a small clivus lesion.  It was contiguous with the adenoma on the 2018 scant, and he question whether it could represent focal clival invasion or extension of the adenoma.  He felt that while unchanged in size between the 2 scans that it has changed in signal intensity, in that it was T2 hypointense and nonenhancing in 2018 and is now T2  bright and enhancing.  Whether or not it is a response to adenoma treatment is not clear and will be somewhat unusual.  He felt that a chordoma was unlikely given the lack of change in T2 hypointensity on the 2018 scan, and that it would certainly make sense to follow this with interval scans.  However, referral to head and neck specialist or neuro-oncology could be considered, which I have discussed with patient.  She is extremely anxious about this, and said that she would not like to wait and have follow-up studies, but would like to have a subspecialist review her MRI scans and determine what other treatment, follow-up needs to be done at this time.  I told her that I would be sending her for follow-up, and asked her to pick her MRI studies up on the disc from neuroradiology, and our office would be in touch about who she would be seeing, and we will give her all of the follow-up information.  We discussed the fact that she is having headaches over the past few weeks, which likely have occurred in the setting prior to menses, due to lack of sleep and significant anxiety around these MRI findings.  I told her I would be in touch with her and happy to see her in follow-up.  Please feel free to contact me with any questions or concerns.     Greater than 50% of today's visit of 60 minutes was spent in counseling and specific discussion of the likely diagnosis and treatment plans.    Verdene Lennert MD

## 2017-09-17 NOTE — Telephone Encounter (Signed)
-----   Message from Cassandria Anger sent at 09/17/2017  3:10 PM EDT -----  Regarding: Dr Allyne Gee  Contact: [phone]  [place] [phone], 47 year old, female, Telephone Information:  Home Phone      [phone]  Work Phone      [phone]  Mobile          [phone]      Patient's Preferred Pharmacy:     [facility], [place]  Phone: [phone] Fax: [phone]      CONFIRMED TODAY: Yes    CALL BACK NUMBER: [phone]  Best time to call back: any  Cell phone:   Other phone:    Available times:    Patient's language of care: English    Patient does not need an interpreter.    Patient's PCP: Janann August, MD    Person calling on behalf of patient: Patient (self)    Calls today to speak to provider only.    Pt had appt with Neurologist and she would like to speak with Dr Allyne Gee only not the RN. Please call back, thank you

## 2017-09-17 NOTE — Progress Notes (Signed)
Safe at home.-AA

## 2017-09-18 ENCOUNTER — Telehealth (HOSPITAL_BASED_OUTPATIENT_CLINIC_OR_DEPARTMENT_OTHER): Payer: Self-pay

## 2017-09-18 ENCOUNTER — Other Ambulatory Visit (HOSPITAL_BASED_OUTPATIENT_CLINIC_OR_DEPARTMENT_OTHER): Payer: Self-pay

## 2017-09-18 NOTE — Progress Notes (Signed)
Called pt. Most of the 20 min phone conversation was spent providing emotional support. I did explain that the neurologist she saw did not mention in her note that she was concerned about the notochordal tumor. The radiologist, neuroradiologist, neurologist all felt that monitoring in 6 months with repeat MRI is reasonable given lack of growth in the past year. However, while she waits for the 6 months, the neurologist felt more comfortable referring her to a specialist at [facility] for further evaluation since the tiny tumor is not something we commonly see. Pt felt better. She is compliant with her cabergoline every other week. Has orders for repeat prolactin next month. She will see me afterwards to review results and next steps.

## 2017-09-18 NOTE — Progress Notes (Signed)
Spoke to pt. Pt requested to come for a therapy appointment, citing stress related to recent health issues. Talked to pt about history of many no shows in a row, and told pt that I could schedule an appointment for next Friday at 2pm, but if she did not appear for this appointment that I would be unable to continue to see her for therapy. Noted that she could get re-referred by her PCP if she wishes, if this came to pass. Pt stated understanding, and confirmed that she would come to the appointment. Appointment scheduled.

## 2017-09-18 NOTE — Progress Notes (Signed)
Thank you for your referral. Patient rescheduled for 09/22/17 with Liz Beach.

## 2017-09-18 NOTE — Progress Notes (Signed)
Alejandra Martin  Female, 47 year old, [birth year]  MRN:   [id number]  Phone:   [phone] [name])  PCP:   Janann August, MD  Coverage:   [place] Rollene Rotunda, MD, MD  10/28/2017 at 11:15 AM  Dr. Allyne Gee patient   Received: Today   Message Contents   [name] [facility]            [facility]     Alejandra Martin [id number], 47 year old, female, Telephone Information:   Home Phone   [phone]   Work Phone   [phone]   Mobile     [phone]       Patient's Preferred Pharmacy:     [place], [place]   Phone: [phone] Fax: [phone]       CONFIRMED TODAY: Alejandra Martin NUMBER:   [phone]   Best time to call back: anytime   Cell phone:   Other phone:     Available times:     Patient's language of care: English     Patient does not need an interpreter.     Patient's PCP: Janann August, MD     Person calling on behalf of patient: Patient (self)     Calls today to speak to provider only.     Pt called yesterday and left message for Dr. Allyne Gee but hasn't received call back. She is eager to speak with Dr. Allyne Gee directly. Pls call.    Call placed to  to pt. . She is highly highly anxious about info she received earlier in the week and is requesting a call back from MD

## 2017-09-22 ENCOUNTER — Ambulatory Visit: Payer: No Typology Code available for payment source | Attending: Psychiatry

## 2017-09-22 DIAGNOSIS — F331 Major depressive disorder, recurrent, moderate: Secondary | ICD-10-CM | POA: Diagnosis present

## 2017-09-22 DIAGNOSIS — F411 Generalized anxiety disorder: Secondary | ICD-10-CM | POA: Diagnosis present

## 2017-09-22 MED ORDER — HYDROXYZINE PAMOATE 25 MG PO CAPS
ORAL_CAPSULE | ORAL | 0 refills | Status: AC
Start: 2017-09-22 — End: 2017-10-27

## 2017-09-22 MED ORDER — DULOXETINE HCL 30 MG PO CPEP
ORAL_CAPSULE | ORAL | 0 refills | Status: AC
Start: 2017-09-22 — End: 2022-10-15

## 2017-09-22 MED ORDER — HYDROXYZINE PAMOATE 25 MG PO CAPS: capsule | ORAL | 0 refills | 0 days | Status: AC

## 2017-09-22 MED ORDER — DULOXETINE HCL 30 MG PO CPEP: capsule | ORAL | 0 refills | 0 days | Status: AC

## 2017-09-22 NOTE — Progress Notes (Signed)
ADULT PSYCHIATRY INITIAL EVALUATION    Larisha Vencill is a 47 year old female Patient Active Problem List:     Allergy, unspecified not elsewhere classified     Acute gastritis without mention of hemorrhage     Intestinal infection due to Clostridium difficile     Pure hypercholesterolemia     Major depressive disorder, recurrent episode, moderate with anxious distress (HCC)     Generalized anxiety disorder     Benign positional vertigo     Contraceptive management     Tubular adenoma of colon     GERD (gastroesophageal reflux disease)     Diarrhea     Cholecystitis     Epigastric abdominal pain     Mass of ampulla of Vater     Gastroesophageal reflux disease with esophagitis     Obesity     Colon polyp     Mucosal abnormality of duodenum     Abnormal LFTs     Allergic rhinitis due to pollen     Anisometropic amblyopia of right eye     Barrett's esophagus     Disorder of biliary tract     Gastroesophageal reflux disease     Mild intermittent asthma without complication     Prediabetes     Tinea pedis of both feet     Perimenopause     Hyperprolactinemia (HCC)     BMI 45.0-49.9, adult ([place])     Hyperlipidemia     ERRONEOUS ENCOUNTER--DISREGARD      CHIEF COMPLAINT: "Regarding some medications"    HISTORY of PRESENT ILLNESS:   Client shares she has been in psychotherapy and was encouraged to seek med management. Multiple missed appointments with therapist and was last seen in May 2019.   States this has been due to her having multiple medical issues and appointments.  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  Feels like her psych symptoms have been worsening over the past few months.   Feeling overwhelmed with her medical status and just the course of her life over the past few years. Shared she cared for her father who died of cancer about 5 years ago. She now has worries about her health and getting cancer. She worries about multiple family members. She now cares  for her mother who is on dialysis and has anticipatory anxiety about loosing her.  States she is constantly worried or catastophizing.   This can impact her sleep --difficulty falling asleep and maintaining sleep. 5 hours or broken sleep. Anxious dreams will wake her. Periods of panic with SOB.     Mood- depressed and tearful. Rates mood 3/10. No SI or HI. Appetite fair--no weight changes reported. Low energy. Low interest or motivation. Fatigue. Wanting to isolate. Physical manifestations of pain to L side of her head since finding out about the new tumor? Describes it as suffering from mild headaches in the past--but now wonders if its migraines. No light sensitivities,n/v  Or other debilitating reports. She will be following up with medical team.      Psychosis.      Stressor---mother on dialysis, client's own physical health.     Substance: denies    Social: Lives in house with husband. 50 y.o daughter. Not employed. Husband is only income source.     CURRENT MEDICATIONS:   Current Outpatient Medications:   .  omeprazole (PRILOSEC) 40 MG capsule, Take 1 capsule by mouth daily, Disp: 30 capsule, Rfl: 2  .  sodium chloride (OCEAN) 0.65 % nasal spray, 1 spray by Each Nostril route as needed for Congestion, Disp: 30 mL, Rfl: 3  .  cetirizine (ZYRTEC) 10 MG chewable tablet, Take 1 tablet by mouth daily, Disp: 30 tablet, Rfl: 1  .  cabergoline (DOSTINEX) 0.5 MG tablet, Take 0.5 tablets by mouth every 7 days, Disp: 12 tablet, Rfl: 3  .  atorvastatin (LIPITOR) 40 MG tablet, Take 1 tablet by mouth daily, Disp: 30 tablet, Rfl: 11  .  albuterol (PROVENTIL HFA,VENTOLIN HFA, PROAIR HFA) 108 (90 Base) MCG/ACT inhaler, Inhale 2 puffs into the lungs every 4 (four) hours as needed for Wheezing or Shortness of breath, Disp: 1 Inhaler, Rfl: 1  .  sertraline (ZOLOFT) 25 MG tablet, Take 1 tablet by mouth daily for 7 days, Disp: 7 tablet, Rfl: 0  .  loratadine (CLARITIN) 10 MG tablet, Take 1 tablet by mouth daily, Disp: 30 tablet,  Rfl: 11  .  albuterol (PROVENTIL) (2.5 MG/3ML) 0.083% nebulizer solution, Take 3 mLs by nebulization every 6 (six) hours as needed for Wheezing, Disp: 75 mL, Rfl: 0    Past Medications: Further assessment indicated. Zoloft, Prozac, Celexa.    CURRENT TREATMENT: Psychopharmacology/ Therapy    System Involvement: none    PAST PSYCHIATRIC HISTORY: Client reports having some anxiety growing up. Described her father as very strict and restrictive. States she was always a person to worry given her responsibilities. Was the caregiver to her father who died 5 years ago to cancer. Owing to depression and increasing anxiety, she was put on antidepressants and benzo trials since 2006 from her PCP. 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 Feeling like she made sacrifices for everyone but herself over the years. She sought formal psych at [facility] from about 2015-2017 then transferred to [facility]. No hx of hospitalizations or suicide attempts.      SUBSTANCE USE: [employer]: unaccessbile.  Denies hx of drugs, ETOH, cig use.      Family Constellation: Parents: Father deceased. Cares for her mother. Siblings: 8 siblings. Client 7 of 9 children  Biological Family History: 2 sisters depression, anxiety.       Social History: Born in [name] . [place]ocated to [name] age 30 and raised by both parents. Education : HS grad. Describes childhood as good- but restrictive.  Married x one for past 19 yrs. . Number of children one daughter age 63.  Current employment : none--hx of customer service. Legal : None  Currently living in a house with husband and daughter. Current supports : Friends, siblings.     Trauma History: Lost her father to cancer around 2014 and then lost her 91 y.o niece a year later to drug overdose.      MEDICAL HISTORY: Patient Active Problem List:     Allergy, unspecified not elsewhere classified     Acute gastritis without mention of hemorrhage     Intestinal  infection due to Clostridium difficile     Pure hypercholesterolemia     Major depressive disorder, recurrent episode, moderate with anxious distress (HCC)     Generalized anxiety disorder     Benign positional vertigo     Contraceptive management     Tubular adenoma of colon     GERD (gastroesophageal reflux disease)     Diarrhea     Cholecystitis     Epigastric abdominal pain     Mass of ampulla  of Vater     Gastroesophageal reflux disease with esophagitis     Obesity     Colon polyp     Mucosal abnormality of duodenum     Abnormal LFTs     Allergic rhinitis due to pollen     Anisometropic amblyopia of right eye     Barrett's esophagus     Disorder of biliary tract     Gastroesophageal reflux disease     Mild intermittent asthma without complication     Prediabetes     Tinea pedis of both feet     Perimenopause     Hyperprolactinemia (HCC)     BMI 45.0-49.9, adult ([place])     Hyperlipidemia     ERRONEOUS ENCOUNTER--DISREGARD        MENTAL STATUS EXAM:    General Appearance/Behavior: Appropriate grooming. Sits upright. Good eye contact. Calm and cooperative    Gait and Station/Motor: Normal and steady gait. No tics or psychomotor agitation.     Attention/Concentration: Maintains focus throughout interview. Appropriate shift of mental attention. Spells WORLD backwards.     Speech/Language: NRRV. Good articulation    Mood/Affect: Depressed mood. Tearful affect. Full range and stable.    Thought Process/Associations: Clear, logical, linear, coherent.    Thought Content/Perceptions: Responses appropriate to interview. Somatic preoccupations. No delusions. No Hallucinations. No paranoia. No thought disorder.    Judgment/Insight: Fair judgement with decision making. Fair insight.    Orientation: Aware of self, time, location, purpose.    Memory: Good recall of recent and remote events.    Fund of knowledge: Up to date on current events.    Suicidal/Homicidal: Denies SI/HI. No supporting content during  interview.        BIO/PSYCHO/SOCIAL AND RISK FORMULATION(S): Elianie Hubers is a 47 year old female with multiple medical ailments impacting her presentation. Reports multiple losses of family members and difficult transitions throughout her adulthood. There is a family hx of depression and anxiety. No substance abuse reported. Her treatment with MH has been sporadic and mainly through her PCP. Symptoms of depression and anxiety. No history of self harm or hospitalizations. Stable living environment with her husband of 7 years and hr 65 y.o daughter.      Ethnic and cultural factors: [place]  Religious and spiritual beliefs, values and preferences: Cathoic.  Identified sex female. Sexual preference; female    DSM 5 DIAGNOSES:  Primary Psychiatric Diagnosis: (F33.1) Major depressive disorder, recurrent episode, moderate with anxious distress (HCC)  (primary encounter diagnosis)  GAD    Other Medical Conditions:  See prob list  Psychosocial and Contextual Factors:  Medical ailment. Mothers failing health and serves as her caretaker      RISK ASSESSMENT (per scale):  Suicide: low  Violence: low  Addiction: low  Protective Factors: stable housing, supportive family, 25 y.o daughter    PLAN:   F/U in: 2-3 wks  New med: cymbalta 30mg  daily, vistaril 25mg  bid  Continue meds:n/a  Discontinue meds: n/a  Refill applicable: yes  Pharmacy for additional resources and questions regarding medications.  Call clinic for questions, concerns, and support.  Call crisis line/suicide hotline/911.  Follow up with therapist/treatment teams.  Abstain from drugs or ETOH.  PCP for routine follow up and indicated labs.  Reviewed benefits of diet and exercise.          INFORMED CONSENT (for any new medication):  The following side effects, risks and precautions regarding SNRI medications were reviewed:    Sedation, headache, weight  gain, sexual dysfunction, and elevated blood pressure.     The potential impact of these medications on heart  rhythm, which may require that we check ECG's.      The patient understands that blood pressure monitoring may be required while taking SNRIs.     These medications should not be abruptly discontinued, as doing so could yield a number of symptoms, including, lightheadedness, headache, GI disturbance, emotional lability, electric sensations and insomnia, which could be uncomfortable and persist for days or weeks.      The rare possibility that antidepressants can cause unusual scary thoughts and images, at times leading to suicidal feelings. The patient understands that they should report such symptoms urgently to me, to the clinic, the Psychiatric Emergency or to his/her nearest Emergency Room, for urgent assistance.    The possibility that taking an antidepressant could precipitate symptoms of mania or hypomania, and that it is very important  to immediately report any unusual increase in energy or activity the patient notices or that others bring to his/her attention.    These medications can increase the risk of bleeding, particularly GI bleeding. The patient should report any symptoms of uncontrolled bleeding (i.e. increased frequency of nose bleeds, vomiting blood or black tarry stools) to me, the PES or to his/her nearest ER for urgent assistance. Caution should be used with OTC medications that affect bleeding such as aspirin and NSAIDs (ibuprofen, naproxen,etc.).        Liz Beach, APRN

## 2017-09-25 ENCOUNTER — Ambulatory Visit: Payer: No Typology Code available for payment source

## 2017-09-25 DIAGNOSIS — F331 Major depressive disorder, recurrent, moderate: Secondary | ICD-10-CM | POA: Diagnosis not present

## 2017-09-29 NOTE — Progress Notes (Signed)
OUTPATIENT PSYCHIATRY PROGRESS NOTE    INTERPRETER: No    CONTACT INFO FOR OTHER AGENCIES AND MENTAL HEALTH PROVIDERS (IF APPLICABLE): N/A    PROBLEM(S) ADDRESSED IN THIS SESSION:   - Depressed mood  - Anxiety symptoms and ruminative worries    SUBJECTIVE  TODAY'S CHIEF COMPLAINT AND CLINICAL UPDATES IN PATIENT'S WORDS:  1) Chief Complaint (Patient and/or guardian's own words, concerns and expressed thoughts): "I'm so worried about what this could be"    2) New information from patient and/or collateral (Patient's illness: context, course, modifying factors, severity, cultural, family, social, medical history):   - Pt reports that she is very worried about an MRI which had findings about a possible tumor she has in her head. Describes significant stress and worry related to this  - Describes missed appointments during last two months have been related to health issues  - Increases in migraine and headaches which she is afraid is due to possible tumor  - Describes how history of her father dying from cancer increases her fear    OBJECTIVE  DATA REVIEWED (Consider medical labs, radiology, other medical tests; screening/outcome measures; psychological testing; discussion of test results with other clinicians; consultation with other clinicians and systems involved with patient, summary of old records): Chart reviewed    CURRENT MEDICATIONS (make clear medications prescribed by psychiatry; include OTC medications):  Current Outpatient Medications   Medication Sig    DULoxetine (CYMBALTA) 30 MG capsule Take 1 each morning.    hydrOXYzine (VISTARIL) 25 MG capsule Take 1 tab daily prn anxiety. Take 1-2 at bedtime prn sleep.    omeprazole (PRILOSEC) 40 MG capsule Take 1 capsule by mouth daily    sodium chloride (OCEAN) 0.65 % nasal spray 1 spray by Each Nostril route as needed for Congestion    cetirizine (ZYRTEC) 10 MG chewable tablet Take 1 tablet by mouth daily    cabergoline (DOSTINEX) 0.5 MG tablet Take 0.5  tablets by mouth every 7 days    atorvastatin (LIPITOR) 40 MG tablet Take 1 tablet by mouth daily    albuterol (PROVENTIL HFA,VENTOLIN HFA, PROAIR HFA) 108 (90 Base) MCG/ACT inhaler Inhale 2 puffs into the lungs every 4 (four) hours as needed for Wheezing or Shortness of breath    sertraline (ZOLOFT) 25 MG tablet Take 1 tablet by mouth daily for 7 days    loratadine (CLARITIN) 10 MG tablet Take 1 tablet by mouth daily    albuterol (PROVENTIL) (2.5 MG/3ML) 0.083% nebulizer solution Take 3 mLs by nebulization every 6 (six) hours as needed for Wheezing     No current facility-administered medications for this visit.        MEDICATION ADHERENCE (including barriers and how addressed): Yes  MEDICATION SIDE EFFECTS (Prescribers Only): N/A    BIRTH CONTROL (ask females and males): Deferred    CURRENT PREGNANCY: No                                    MENTAL STATUS EXAMINATION                     General Appearance: Pt appearing well groomed, dressed casually     Interaction with Interviewer (eye contact, attitude, behavior): Within normal limits    Physical Signs    Gait and Station (how patient walks and stands): Within normal limits  Physical Appearance: Within normal limits          Normal Movements: Within normal limits         Speech (rate, volume, articulation): Within normal limits          Language: [place] first language, speaks [place] fluently                       Mood: "I'm just so worried"           Affect: tearful, distressed          Thought Process     Rate; concrete vs abstract reasoning: Within normal limits        Logical vs illogical; associations: loose, tangential, circumstantial, intact: Within normal limits                                    Thought Content    Normal Thought Content (other than safety): Within normal limits     Perceptions (auditory, visual, tactile, etc.): None       Impulse Control: Good         Cognition (Link to [facility])    Orientation (person, place, time):  oriented x3      Recent and remote memory: Within normal limits           Attention span and concentration: Within normal limits         Fund of knowledge, awareness of current events and vocabulary: Within normal limits      Judgment: Good          Insight: Good        Suicidality/Homicidality/Aggression (Victimization or Perpetration): None. Pt describes religion as protective factor to suicide.      ASSESSMENT   Today's Assessment:  Pt appearing tearful, distressed. Pt is stable.    Risk Level Assessment  Risk Level Change (if yes, please describe): No    Suicide: low (1)  Violence: low (1)  Addiction: low (1)        Protective Factors: Religious faith, primary supports, housing, hard working    [place] (psychiatric diagnoses and medical diagnoses that factor into management of psychiatric treatment): Major Depressive Disorder, recurrent episode, moderate with anxious distress    CLINICAL FORMULATION (Make changes as your understanding changes. Should coincide with treatment plan): Pt is a 47yo [place] married mother of an 48yo daughter suffering from depression and anxiety symptoms. These symptoms appear to be related to health concerns, originating from the time when her daughter was born with bad acid reflux resulting in necessary hospital care of her newborn. Depressive and anxiety symptoms appear to have worsened in the context of father's multiple cancer diagnosis, and following his death approximately five years ago. Pt's mother is also now ill, and is currently in dialysis. Pt may benefit from therapy in order to process losses, and develop skills in which to manage anxiety and depression.      REVIEWING TODAY'S VISIT  CLINICAL INTERVENTIONS TODAY: Active listening, exploratory questions, validation, connecting thoughts/ feelings/ behaviors, psychoeducation about exercise/ diet/ sleep, encouraging her to talk about her grief, sleep hygiene, Identifying unhelpful thought patterns  exercise, mindfulness and self soothing    PATIENT'S RESPONSE TO INTERVENTIONS: Pt talkative and engaged    PROGRESS TOWARDS GOALS: Slow    TIME SPENT IN PSYCHOTHERAPY: 45 minutes    [place] (Consider risk plan  for patients at moderate or high risk for suicide/violence/addiction; medication plan; referrals, etc. Must coincide with treatment plan.): Continue to monitor for changes in risk.    PLAN FOR ONGOING TREATMENT:  Weekly psychotherapy    INFORMED CONSENT (for any new treatment): Patient was informed of the potential risks and benefits of the treatment, including the option not to treat, and appeared to understand and agreed to comply. Discussion included the following key points: Confidentiality, frame of treatment.      ONLY FOR PRESCRIBERS DOING EVALUATION AND MANAGEMENT VISITS     Use only when no psychotherapy performed  COUNSELING AND COORDINATION OF CARE PROVIDED (Consider diagnostic results/impressions and/or recommended studies; risks and benefits of treatment options; instruction for management/treatment and/or follow-up; importance of compliance with chosen treatment options; risk factor reduction; patient/family/caregiver education; prognosis): N/A      Over 50% of time during today's visit was devoted to counseling and/or coordination of care.  If yes, record estimated duration of the face to face encounter.  N/A    INSTRUCTIONS TO COVERING CLINICIANS:  N/A        .........Marland Kitchen

## 2017-10-01 ENCOUNTER — Telehealth (HOSPITAL_BASED_OUTPATIENT_CLINIC_OR_DEPARTMENT_OTHER): Payer: Self-pay

## 2017-10-01 ENCOUNTER — Telehealth (HOSPITAL_BASED_OUTPATIENT_CLINIC_OR_DEPARTMENT_OTHER): Payer: Self-pay | Admitting: "Endocrinology

## 2017-10-01 NOTE — Progress Notes (Signed)
Returning pt's call. She met with Dr. Joycelyn Rua at [facility] neuro-oncology. I read his note. 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 Therefore, there is no new tumor, no change in size. Her cabergoline was stopped and recommendation made for repeat labs in 4 weeks. The prolactin lab order is already in the computer for September.     She only had spotting in June, no period in July or this month yet. However, states she has been extremely anxious and this may have caused the abnormal periods. In addition, I explained her Bettles level was borderline and since she has hot flashes, she may be in perimenopause.     I spent 20 minutes on reassuring her and comforting her. I explained the very unlikely possibility this is going to become cancer, but that I agree with Dr. Joycelyn Rua on follow up MRI in 6 months and to keep the appointment with him then. In the meantime, we will keep track of her prolactin level next month and if normal, repeat it in 3 months.     All questions answered. She was very appreciative of the call.
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
## 2017-10-06 ENCOUNTER — Ambulatory Visit (HOSPITAL_BASED_OUTPATIENT_CLINIC_OR_DEPARTMENT_OTHER): Payer: No Typology Code available for payment source

## 2017-10-07 ENCOUNTER — Ambulatory Visit: Payer: No Typology Code available for payment source | Attending: Psychiatry

## 2017-10-07 DIAGNOSIS — F331 Major depressive disorder, recurrent, moderate: Secondary | ICD-10-CM | POA: Diagnosis not present

## 2017-10-07 NOTE — Progress Notes (Signed)
OUTPATIENT PSYCHOPHARMACOLOGY PROGRESS NOTE  ?  CC: "To be honest with you I didn't try the medication"  HISTORY: Per client she was overwhelmed with her medical appointments. 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 States he did put her mind at ease that chances of malignancy was low. Feeling less tense. Her sleep patterns do remain fair--averaging 5 hours nightly.   Mood remains low --easily emotional and often times tearful. No SI or HI. No psychosis.   States her goal is to apply for social security benefits given her mood, anxiety and medical status.   ?  EXAMINATION:    General Appearance/Behavior: Appropriate grooming. Sits upright. Good eye contact. Calm and cooperative    Gait and Station/Motor: Normal and steady gait. No tics or psychomotor agitation.     Attention/Concentration: Maintains focus throughout interview. Appropriate shift of mental attention.    Speech/Language: Fast RRV. Good articulation    Mood/Affect: Depressed and anxious mood. Tearful and anxious affect. Full range and stable.    Thought Process/Associations: Clear, logical, linear, coherent.    Thought Content/Perceptions: Responses appropriate to interview. No preoccupations. No delusions. No Hallucinations. No paranoia. No thought disorder.    Judgment/Insight: Good judgement with decision making. Good insight.    Orientation: Aware of self, time, location, purpose.    Memory: Good recall of recent and remote events.    Fund of knowledge: Up to date on current events.    Suicidal/Homicidal: Denies SI/HI. No supporting content during interview.      ?  DATA REVIEWED: 08/28/2017 12:42 AM - Interface, Lab     Component Value Flag Ref Range Units Status   PROLACTIN 0.8  Low   2.2 - 30.3 ng/mL Final   Comment:     ?    Pregnancy/Birth Control:no  MASSPAT:n/a?  Current medications (by psychiatry in  [facility]):     Current Outpatient Medications:     DULoxetine (CYMBALTA) 30 MG capsule, Take 1 each morning., Disp: 30 capsule, Rfl: 0    hydrOXYzine (VISTARIL) 25 MG capsule, Take 1 tab daily prn anxiety. Take 1-2 at bedtime prn sleep., Disp: 60 capsule, Rfl: 0    omeprazole (PRILOSEC) 40 MG capsule, Take 1 capsule by mouth daily, Disp: 30 capsule, Rfl: 2    sodium chloride (OCEAN) 0.65 % nasal spray, 1 spray by Each Nostril route as needed for Congestion, Disp: 30 mL, Rfl: 3    cetirizine (ZYRTEC) 10 MG chewable tablet, Take 1 tablet by mouth daily, Disp: 30 tablet, Rfl: 1    cabergoline (DOSTINEX) 0.5 MG tablet, Take 0.5 tablets by mouth every 7 days, Disp: 12 tablet, Rfl: 3    atorvastatin (LIPITOR) 40 MG tablet, Take 1 tablet by mouth daily, Disp: 30 tablet, Rfl: 11    albuterol (PROVENTIL HFA,VENTOLIN HFA, PROAIR HFA) 108 (90 Base) MCG/ACT inhaler, Inhale 2 puffs into the lungs every 4 (four) hours as needed for Wheezing or Shortness of breath, Disp: 1 Inhaler, Rfl: 1    sertraline (ZOLOFT) 25 MG tablet, Take 1 tablet by mouth daily for 7 days, Disp: 7 tablet, Rfl: 0    loratadine (CLARITIN) 10 MG tablet, Take 1 tablet by mouth daily, Disp: 30 tablet, Rfl: 11    albuterol (PROVENTIL) (2.5 MG/3ML) 0.083% nebulizer solution, Take 3 mLs by nebulization every 6 (six) hours as needed for Wheezing, Disp: 75 mL, Rfl: 0     ?  ASSESSMENT:  Overall Clinical formulation (copied from initial evaluation- amended as indicated- this gives quick context for yourself or covering provider): Loraina Stauffer is a 47 year old female with multiple medical ailments impacting her presentation. Reports multiple losses of family members and difficult transitions throughout her adulthood. There is a family hx of depression and anxiety. No substance abuse reported. Her treatment with MH has been sporadic and mainly through her PCP. Symptoms of depression and anxiety. No history of self harm or hospitalizations. Stable living  environment with her husband of 97 years and hr 40 y.o daughter.        ?  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  Current Assessment:   ?  INTERVENTIONS DURING VISIT:  Counseling and Coordination of Care provided(If using this for billing, use smart phrase .coun)  R/B of medications reviewed.   Therapeutic listening and supportive counseling provided re: medical health, application for social security benefits.  ?  PLAN:  F/U in: 4 wks  New med: n/a  Continue meds:Cymbalta 68m , vistaril 236mbid  Discontinue meds: n/a  Refill applicable: yes  Pharmacy for additional resources and questions regarding medications.  Call clinic for questions, concerns, and support.  Call crisis line/suicide hotline/911.  Follow up with therapist/treatment teams.  Abstain from drugs or ETOH.  PCP for routine follow up and indicated labs.  Reviewed benefits of diet and exercise.      JaAudry PiliMHNP-BC

## 2017-10-09 ENCOUNTER — Ambulatory Visit: Payer: No Typology Code available for payment source | Attending: Psychiatry

## 2017-10-09 DIAGNOSIS — F331 Major depressive disorder, recurrent, moderate: Secondary | ICD-10-CM | POA: Diagnosis not present

## 2017-10-12 NOTE — Progress Notes (Signed)
OUTPATIENT PSYCHIATRY PROGRESS NOTE    INTERPRETER: No    CONTACT INFO FOR OTHER AGENCIES AND MENTAL HEALTH PROVIDERS (IF APPLICABLE): N/A    PROBLEM(S) ADDRESSED IN THIS SESSION:   - Depressed mood  - Anxiety symptoms and ruminative worries    SUBJECTIVE  TODAY'S CHIEF COMPLAINT AND CLINICAL UPDATES IN PATIENT'S WORDS:  1) Chief Complaint (Patient and/or guardian's own words, concerns and expressed thoughts): "I am feeling a bit better, but I am not sure how things will go with the commute to drop my daughter off at school"    2) New information from patient and/or collateral (Patient's illness: context, course, modifying factors, severity, cultural, family, social, medical history):   - Pt describes significant anxiety when in the car, particularly in traffic  - Describes fears related to having to commute during rush hour to drop her daughter off at school  - Saw a IT consultant at [facility] that thinks she has sleep apnea, recommended sleep study    OBJECTIVE  DATA REVIEWED (Consider medical labs, radiology, other medical tests; screening/outcome measures; psychological testing; discussion of test results with other clinicians; consultation with other clinicians and systems involved with patient, summary of old records): Chart reviewed    CURRENT MEDICATIONS (make clear medications prescribed by psychiatry; include OTC medications):  Current Outpatient Medications   Medication Sig    DULoxetine (CYMBALTA) 30 MG capsule Take 1 each morning.    hydrOXYzine (VISTARIL) 25 MG capsule Take 1 tab daily prn anxiety. Take 1-2 at bedtime prn sleep.    omeprazole (PRILOSEC) 40 MG capsule Take 1 capsule by mouth daily    sodium chloride (OCEAN) 0.65 % nasal spray 1 spray by Each Nostril route as needed for Congestion    cetirizine (ZYRTEC) 10 MG chewable tablet Take 1 tablet by mouth daily    cabergoline (DOSTINEX) 0.5 MG tablet Take 0.5 tablets by mouth every 7 days    atorvastatin (LIPITOR) 40 MG tablet Take 1  tablet by mouth daily    albuterol (PROVENTIL HFA,VENTOLIN HFA, PROAIR HFA) 108 (90 Base) MCG/ACT inhaler Inhale 2 puffs into the lungs every 4 (four) hours as needed for Wheezing or Shortness of breath    sertraline (ZOLOFT) 25 MG tablet Take 1 tablet by mouth daily for 7 days    loratadine (CLARITIN) 10 MG tablet Take 1 tablet by mouth daily    albuterol (PROVENTIL) (2.5 MG/3ML) 0.083% nebulizer solution Take 3 mLs by nebulization every 6 (six) hours as needed for Wheezing     No current facility-administered medications for this visit.        MEDICATION ADHERENCE (including barriers and how addressed): Yes  MEDICATION SIDE EFFECTS (Prescribers Only): N/A    BIRTH CONTROL (ask females and males): Deferred    CURRENT PREGNANCY: No                                    MENTAL STATUS EXAMINATION                     General Appearance: Pt appearing well groomed, dressed casually     Interaction with Interviewer (eye contact, attitude, behavior): Within normal limits    Physical Signs    Gait and Station (how patient walks and stands): Within normal limits                  Physical Appearance: Within normal limits  Normal Movements: Within normal limits         Speech (rate, volume, articulation): Within normal limits          Language: [place] first language, speaks [place] fluently                       Mood: "I'm still feeling anxious"           Affect: dysthymic, anxious    Thought Process     Rate; concrete vs abstract reasoning: Within normal limits        Logical vs illogical; associations: loose, tangential, circumstantial, intact: Within normal limits                                    Thought Content    Normal Thought Content (other than safety): Within normal limits     Perceptions (auditory, visual, tactile, etc.): None       Impulse Control: Good         Cognition (Link to [facility])    Orientation (person, place, time): oriented x3      Recent and remote memory: Within normal limits            Attention span and concentration: Within normal limits         Fund of knowledge, awareness of current events and vocabulary: Within normal limits      Judgment: Good          Insight: Good        Suicidality/Homicidality/Aggression (Victimization or Perpetration): None. Pt describes religion as protective factor to suicide.      ASSESSMENT   Today's Assessment:  Pt appearing dysthymic, anxious. Pt is stable.    Risk Level Assessment  Risk Level Change (if yes, please describe): No    Suicide: low (1)  Violence: low (1)  Addiction: low (1)        Protective Factors: Religious faith, primary supports, housing, hard working    [place] (psychiatric diagnoses and medical diagnoses that factor into management of psychiatric treatment): Major Depressive Disorder, recurrent episode, moderate with anxious distress    CLINICAL FORMULATION (Make changes as your understanding changes. Should coincide with treatment plan): Pt is a 47yo [place] married mother of an 49yo daughter suffering from depression and anxiety symptoms. These symptoms appear to be related to health concerns, originating from the time when her daughter was born with bad acid reflux resulting in necessary hospital care of her newborn. Depressive and anxiety symptoms appear to have worsened in the context of father's multiple cancer diagnosis, and following his death approximately five years ago. Pt's mother is also now ill, and is currently in dialysis. Pt may benefit from therapy in order to process losses, and develop skills in which to manage anxiety and depression.      REVIEWING TODAY'S VISIT  CLINICAL INTERVENTIONS TODAY: Active listening, exploratory questions, validation, connecting thoughts/ feelings/ behaviors, psychoeducation about exercise/ diet/ sleep, encouraging her to talk about her grief, sleep hygiene, Identifying unhelpful thought patterns exercise, mindfulness and self soothing    PATIENT'S RESPONSE TO INTERVENTIONS:  Pt talkative and engaged    PROGRESS TOWARDS GOALS: Slow    TIME SPENT IN PSYCHOTHERAPY: 45 minutes    [place] (Consider risk plan for patients at moderate or high risk for suicide/violence/addiction; medication plan; referrals, etc. Must coincide with treatment plan.): Continue to  monitor for changes in risk.    PLAN FOR ONGOING TREATMENT:  Weekly psychotherapy    INFORMED CONSENT (for any new treatment): Patient was informed of the potential risks and benefits of the treatment, including the option not to treat, and appeared to understand and agreed to comply. Discussion included the following key points: Confidentiality, frame of treatment.      ONLY FOR PRESCRIBERS DOING EVALUATION AND MANAGEMENT VISITS     Use only when no psychotherapy performed  COUNSELING AND COORDINATION OF CARE PROVIDED (Consider diagnostic results/impressions and/or recommended studies; risks and benefits of treatment options; instruction for management/treatment and/or follow-up; importance of compliance with chosen treatment options; risk factor reduction; patient/family/caregiver education; prognosis): N/A      Over 50% of time during today's visit was devoted to counseling and/or coordination of care.  If yes, record estimated duration of the face to face encounter.  N/A    INSTRUCTIONS TO COVERING CLINICIANS:  N/A        ..........Marland Kitchen

## 2017-10-13 ENCOUNTER — Ambulatory Visit: Payer: No Typology Code available for payment source | Attending: Psychiatry

## 2017-10-13 DIAGNOSIS — F331 Major depressive disorder, recurrent, moderate: Secondary | ICD-10-CM | POA: Insufficient documentation

## 2017-10-13 NOTE — Progress Notes (Signed)
OUTPATIENT PSYCHIATRY PROGRESS NOTE    INTERPRETER: No    CONTACT INFO FOR OTHER AGENCIES AND MENTAL HEALTH PROVIDERS (IF APPLICABLE): N/A    PROBLEM(S) ADDRESSED IN THIS SESSION:   - Depressed mood  - Anxiety symptoms and ruminative worries    SUBJECTIVE  TODAY'S CHIEF COMPLAINT AND CLINICAL UPDATES IN PATIENT'S WORDS:  1) Chief Complaint (Patient and/or guardian's own words, concerns and expressed thoughts): "I don't want to have to go be on a jury, I don't want to listen to other people's sad stories"    2) New information from patient and/or collateral (Patient's illness: context, course, modifying factors, severity, cultural, family, social, medical history):   - Pt describes that first day driving her daughter to new school went well. Describes pride related to her daughter, feeling proud of her, ways in which she thinks highly of her. Describes fears related to daughter getting older and starting middle school  - Describes wish to have me sign a letter saying that she is not able to complete jury duty due to anxiety and depression. Pt describes worries related to commuting to jury duty, as well as listening to other people's problems  - Describes fears that she is not going to ever feel better/ like she did before (able to work, feel productive, not be depressed and anxious all the time)    [place] (Consider medical labs, radiology, other medical tests; screening/outcome measures; psychological testing; discussion of test results with other clinicians; consultation with other clinicians and systems involved with patient, summary of old records): Chart reviewed    CURRENT MEDICATIONS (make clear medications prescribed by psychiatry; include OTC medications):  Current Outpatient Medications   Medication Sig    DULoxetine (CYMBALTA) 30 MG capsule Take 1 each morning.    hydrOXYzine (VISTARIL) 25 MG capsule Take 1 tab daily prn anxiety. Take 1-2 at bedtime prn sleep.    omeprazole (PRILOSEC)  40 MG capsule Take 1 capsule by mouth daily    sodium chloride (OCEAN) 0.65 % nasal spray 1 spray by Each Nostril route as needed for Congestion    cetirizine (ZYRTEC) 10 MG chewable tablet Take 1 tablet by mouth daily    cabergoline (DOSTINEX) 0.5 MG tablet Take 0.5 tablets by mouth every 7 days    atorvastatin (LIPITOR) 40 MG tablet Take 1 tablet by mouth daily    albuterol (PROVENTIL HFA,VENTOLIN HFA, PROAIR HFA) 108 (90 Base) MCG/ACT inhaler Inhale 2 puffs into the lungs every 4 (four) hours as needed for Wheezing or Shortness of breath    sertraline (ZOLOFT) 25 MG tablet Take 1 tablet by mouth daily for 7 days    loratadine (CLARITIN) 10 MG tablet Take 1 tablet by mouth daily    albuterol (PROVENTIL) (2.5 MG/3ML) 0.083% nebulizer solution Take 3 mLs by nebulization every 6 (six) hours as needed for Wheezing     No current facility-administered medications for this visit.        MEDICATION ADHERENCE (including barriers and how addressed): Yes  MEDICATION SIDE EFFECTS (Prescribers Only): N/A    BIRTH CONTROL (ask females and males): Deferred    CURRENT PREGNANCY: No                                    MENTAL STATUS EXAMINATION                     General Appearance:  Pt appearing well groomed, dressed casually     Interaction with Interviewer (eye contact, attitude, behavior): Within normal limits    Physical Signs    Gait and Station (how patient walks and stands): Within normal limits                  Physical Appearance: Within normal limits          Normal Movements: Within normal limits         Speech (rate, volume, articulation): Within normal limits          Language: [place] first language, speaks [place] fluently                       Mood: "I just feel anxious thinking about it"           Affect: dysthymic, anxious    Thought Process     Rate; concrete vs abstract reasoning: Within normal limits        Logical vs illogical; associations: loose, tangential, circumstantial, intact: Within normal  limits                                    Thought Content    Normal Thought Content (other than safety): Within normal limits     Perceptions (auditory, visual, tactile, etc.): None       Impulse Control: Good         Cognition (Link to [facility])    Orientation (person, place, time): oriented x3      Recent and remote memory: Within normal limits           Attention span and concentration: Within normal limits         Fund of knowledge, awareness of current events and vocabulary: Within normal limits      Judgment: Good          Insight: Good        Suicidality/Homicidality/Aggression (Victimization or Perpetration): None. Pt describes religion as protective factor to suicide.      ASSESSMENT   Today's Assessment:  Pt appearing dysthymic, anxious, as she describes life stressors. Pt is stable.    Risk Level Assessment  Risk Level Change (if yes, please describe): No    Suicide: low (1)  Violence: low (1)  Addiction: low (1)        Protective Factors: Religious faith, primary supports, housing, hard working    [place] (psychiatric diagnoses and medical diagnoses that factor into management of psychiatric treatment): Major Depressive Disorder, recurrent episode, moderate with anxious distress    CLINICAL FORMULATION (Make changes as your understanding changes. Should coincide with treatment plan): Pt is a 47yo [place] married mother of an 10yo daughter suffering from depression and anxiety symptoms. These symptoms appear to be related to health concerns, originating from the time when her daughter was born with bad acid reflux resulting in necessary hospital care of her newborn. Depressive and anxiety symptoms appear to have worsened in the context of father's multiple cancer diagnosis, and following his death approximately five years ago. Pt's mother is also now ill, and is currently in dialysis. Pt may benefit from therapy in order to process losses, and develop skills in which to manage anxiety and  depression.      REVIEWING TODAY'S VISIT  CLINICAL INTERVENTIONS TODAY: Active listening, exploratory questions, validation, connecting thoughts/ feelings/ behaviors, psychoeducation about exercise/ diet/  sleep, encouraging her to talk about her grief, sleep hygiene, Identifying unhelpful thought patterns exercise, mindfulness and self soothing, psychoeducation around anxiety and depression    PATIENT'S RESPONSE TO INTERVENTIONS: Pt talkative and engaged    PROGRESS TOWARDS GOALS: Slow    TIME SPENT IN PSYCHOTHERAPY: 45 minutes    PLAN  PLAN FOR MANAGING RISK (Consider risk plan for patients at moderate or high risk for suicide/violence/addiction; medication plan; referrals, etc. Must coincide with treatment plan.): Continue to monitor for changes in risk.    PLAN FOR ONGOING TREATMENT:  Weekly psychotherapy    INFORMED CONSENT (for any new treatment): Patient was informed of the potential risks and benefits of the treatment, including the option not to treat, and appeared to understand and agreed to comply. Discussion included the following key points: Confidentiality, frame of treatment.      ONLY FOR PRESCRIBERS DOING EVALUATION AND MANAGEMENT VISITS     Use only when no psychotherapy performed  COUNSELING AND COORDINATION OF CARE PROVIDED (Consider diagnostic results/impressions and/or recommended studies; risks and benefits of treatment options; instruction for management/treatment and/or follow-up; importance of compliance with chosen treatment options; risk factor reduction; patient/family/caregiver education; prognosis): N/A      Over 50% of time during today's visit was devoted to counseling and/or coordination of care.  If yes, record estimated duration of the face to face encounter.  N/A    INSTRUCTIONS TO COVERING CLINICIANS:  N/A        ...........Marland Kitchen

## 2017-10-20 ENCOUNTER — Encounter (HOSPITAL_BASED_OUTPATIENT_CLINIC_OR_DEPARTMENT_OTHER): Payer: Self-pay | Admitting: Internal Medicine

## 2017-10-27 ENCOUNTER — Ambulatory Visit: Payer: No Typology Code available for payment source | Attending: Internal Medicine | Admitting: Internal Medicine

## 2017-10-27 ENCOUNTER — Encounter (HOSPITAL_BASED_OUTPATIENT_CLINIC_OR_DEPARTMENT_OTHER): Payer: Self-pay | Admitting: Internal Medicine

## 2017-10-27 ENCOUNTER — Encounter (HOSPITAL_BASED_OUTPATIENT_CLINIC_OR_DEPARTMENT_OTHER): Payer: Self-pay

## 2017-10-27 VITALS — BP 110/64 | HR 82 | Temp 98.0°F | Ht 63.0 in | Wt 291.0 lb

## 2017-10-27 DIAGNOSIS — K22719 Barrett's esophagus with dysplasia, unspecified: Secondary | ICD-10-CM | POA: Diagnosis present

## 2017-10-27 DIAGNOSIS — Z6841 Body Mass Index (BMI) 40.0 and over, adult: Secondary | ICD-10-CM

## 2017-10-27 DIAGNOSIS — R51 Headache: Secondary | ICD-10-CM

## 2017-10-27 DIAGNOSIS — R945 Abnormal results of liver function studies: Secondary | ICD-10-CM | POA: Diagnosis present

## 2017-10-27 DIAGNOSIS — F411 Generalized anxiety disorder: Secondary | ICD-10-CM | POA: Diagnosis present

## 2017-10-27 DIAGNOSIS — E785 Hyperlipidemia, unspecified: Secondary | ICD-10-CM

## 2017-10-27 DIAGNOSIS — R930 Abnormal findings on diagnostic imaging of skull and head, not elsewhere classified: Secondary | ICD-10-CM | POA: Insufficient documentation

## 2017-10-27 DIAGNOSIS — R519 Headache, unspecified: Secondary | ICD-10-CM | POA: Insufficient documentation

## 2017-10-27 DIAGNOSIS — R7989 Other specified abnormal findings of blood chemistry: Secondary | ICD-10-CM

## 2017-10-27 MED ORDER — ATORVASTATIN CALCIUM 40 MG PO TABS
40.00 mg | ORAL_TABLET | Freq: Every day | ORAL | 3 refills | Status: AC
Start: 2017-10-27 — End: 2022-10-15

## 2017-10-27 MED ORDER — ATORVASTATIN CALCIUM 40 MG PO TABS: 40 mg | tablet | Freq: Every day | ORAL | 3 refills | 0 days | Status: AC

## 2017-10-27 NOTE — Progress Notes (Signed)
Patient presents with:  Headache    Headache  Per neuro-oncology note:  "ASSESSMENT AND PLAN:  In summary, this is a 47 year old, right-handed woman with clivus  lesion discovered after she had menorrhagia found to have  elevated prolactin and had a brain MRI. She had mild elevation of  prolactin consistent with compression of the stalk rather than  secreting adenoma. Repeat MRI of the pituitary with fat  saturation in six months. Stop cabergoline. Check prolactin level  after four week.  Sleep study. 60 minutes were spent with the  patient of which 30 minutes was counseling. Additional time was  spent on reviewing tests and images, and preparing documentation.  All questions were answered to the best of my ability."  She has been feeling less anxious but remains worried about the possibility of this becoming cancer  Headaches have improved  Has f/u plan for f/u prolactin   No treatment for headache started  Still getting headaches 3 times/week - takes tylenol - which helps  Says will have testing for menopause with her blood work    Hyperlipidemia  Never took the [place] never got the prescription  She is willing to take it    Anxiety/depression   Has been seeing counselor and psychiatrist  Changed to cymbalta  Stopped zoloft 3 weeks ago when changed   Thinks she is less anxious  Has f/u 9/20  No SI  PHQ9 15  Requesting letter for excuse for jury duty  Patient states she is very anxious, does not think she can concentrate or handle performing on a jury    GERD/Barretts  Seeing Dr. Rollene Rotunda tomorrow  PMH/MEDS/ALL REVIEWED  Physical Examination:  BP 110/64  Pulse 82  Temp 40 F (36.7 C) (Temporal)  Ht _0  (1.6 m)  Wt 132 kg (291 lb)  SpO2 96%  BMI 51.55 kg/m2   GEN: NAD   LUNGS: Clear to auscultation bilaterally  HEART: normal S1, S2 no murmurs/rubs/gallops  ABD: soft, non-tender, non-distended, no guarding or rebound  EXTREM: no c/c/e      A/P  Hyperlipidemia, obesity  - labs reviewed  Never started  medication  - reviewed risks of hyperlipidemia including cardiovascualar disease and treatment options  - refer to nutrition for further counseling  - start statin as per orders  I reviewed side effects of statins including liver failure, elevated LFT's, rhabdo/muscle cramps.  I explained the need for every 12 week blood tests for monitoring of liver function on this medication.  I also explained that if the patient develops muscle cramps on this medication that it should be stopped immediately, and the patient should contact the office to have blood work done.    Headache, clivus lesion - following with neuro-onc and serial imaging is planned, off cabergoline with repeat prolactin planned.  Notes reviewed.  Headaches may be related to stress and have improved  - f/u with endo and neuro-onc as planned  - d/w patient, offered encouragement and support    Anxiety, depression - has been changed to cymbalta, anxiety worsened by recent issues with abnormal MRI and worries about this eventually turning into cancer. No SI  She is being followed closely for this and she is less anxious but still bothered by it  - cont current rx  - cont psych f/u  - jury duty letter provided to patient - her anxiety and concentration problems make it difficult for her to be able to serve on a  jury  Medical release signed    GERD/Barrett's Esophagus - follows with Dr. Rollene Rotunda in GI - has appt tomorrow, takes 20m omeprazole daily  - counseled patient on diagnosis and treatment of symptoms and signs for concern  - f/u with GI as planned tomorrow  - cont PPI  - she will also discuss her concerns about her liver - sister with elevated LFT's due to fatty liver, she has normal LFT's with isolated alk phos at one time

## 2017-10-28 ENCOUNTER — Ambulatory Visit: Payer: No Typology Code available for payment source | Attending: Internal Medicine | Admitting: Gastroenterology

## 2017-10-28 VITALS — BP 138/91 | HR 82 | Temp 97.2°F | Resp 16 | Ht 63.0 in | Wt 290.0 lb

## 2017-10-28 DIAGNOSIS — R1033 Periumbilical pain: Secondary | ICD-10-CM | POA: Diagnosis not present

## 2017-10-28 DIAGNOSIS — D352 Benign neoplasm of pituitary gland: Secondary | ICD-10-CM | POA: Diagnosis present

## 2017-10-28 DIAGNOSIS — K760 Fatty (change of) liver, not elsewhere classified: Secondary | ICD-10-CM | POA: Diagnosis not present

## 2017-10-28 DIAGNOSIS — F411 Generalized anxiety disorder: Secondary | ICD-10-CM | POA: Diagnosis present

## 2017-10-28 DIAGNOSIS — K921 Melena: Secondary | ICD-10-CM | POA: Diagnosis not present

## 2017-10-28 DIAGNOSIS — K22719 Barrett's esophagus with dysplasia, unspecified: Secondary | ICD-10-CM | POA: Diagnosis not present

## 2017-10-28 LAB — COMPREHENSIVE METABOLIC PANEL
ALANINE AMINOTRANSFERASE: 28 U/L (ref 12–45)
ALBUMIN: 3.9 g/dL (ref 3.4–5.0)
ALKALINE PHOSPHATASE: 97 U/L (ref 45–117)
ANION GAP: 11 mmol/L (ref 5–15)
ASPARTATE AMINOTRANSFERASE: 20 U/L (ref 8–34)
BILIRUBIN TOTAL: 0.2 mg/dL (ref 0.2–1.0)
BUN (UREA NITROGEN): 15 mg/dL (ref 7–18)
CALCIUM: 9.7 mg/dL (ref 8.5–10.1)
CARBON DIOXIDE: 27 mmol/L (ref 21–32)
CHLORIDE: 99 mmol/L (ref 98–107)
CREATININE: 0.7 mg/dL (ref 0.4–1.2)
ESTIMATED GLOMERULAR FILT RATE: >60 mL/min (ref >60)
Glucose Random: 75 mg/dL (ref 74–160)
POTASSIUM: 4.3 mmol/L (ref 3.5–5.1)
SODIUM: 137 mmol/L (ref 136–145)
TOTAL PROTEIN: 7.6 g/dL (ref 6.4–8.2)

## 2017-10-28 LAB — PROLACTIN: PROLACTIN: 22 ng/mL (ref 2.2–30.3)

## 2017-10-28 LAB — CBC, PLATELET & DIFFERENTIAL
ABSOLUTE BASO COUNT: 0 10*3/uL (ref 0.0–0.1)
ABSOLUTE EOSINOPHIL COUNT: 0.1 10*3/uL (ref 0.0–0.8)
ABSOLUTE IMM GRAN COUNT: 0.01 10*3/uL (ref 0.00–0.03)
ABSOLUTE LYMPH COUNT: 2 10*3/uL (ref 0.6–5.9)
ABSOLUTE MONO COUNT: 0.5 10*3/uL (ref 0.2–1.4)
ABSOLUTE NEUTROPHIL COUNT: 6.6 10*3/uL (ref 1.6–8.3)
BASOPHIL %: 0.2 % (ref 0.0–1.2)
EOSINOPHIL %: 0.7 % (ref 0.0–7.0)
HEMATOCRIT: 40.1 % (ref 34.1–44.9)
HEMOGLOBIN: 13 g/dL (ref 11.2–15.7)
IMMATURE GRANULOCYTE %: 0.1 % (ref 0.0–0.4)
LYMPHOCYTE %: 21.5 % (ref 15.0–54.0)
MEAN CORP HGB CONC: 32.4 g/dL (ref 31.0–37.0)
MEAN CORPUSCULAR HGB: 29.5 pg (ref 26.0–34.0)
MEAN CORPUSCULAR VOL: 90.9 fL (ref 80.0–100.0)
MEAN PLATELET VOLUME: 9.6 fL (ref 8.7–12.5)
MONOCYTE %: 5.3 % (ref 4.0–13.0)
NEUTROPHIL %: 72.2 % (ref 40.0–75.0)
PLATELET COUNT: 268 10*3/uL (ref 150–400)
RBC DISTRIBUTION WIDTH STD DEV: 43.8 fL (ref 35.1–46.3)
RBC DISTRIBUTION WIDTH: 13.5 % (ref 11.5–14.3)
RED BLOOD CELL COUNT: 4.41 M/uL (ref 3.90–5.20)
WHITE BLOOD CELL COUNT: 9.1 10*3/uL (ref 4.0–11.0)

## 2017-10-28 LAB — LIPASE: LIPASE: 87 U/L (ref 73–393)

## 2017-10-28 MED ORDER — PEG 3350-KCL-NA BICARB-NACL 420 G PO SOLR
4000.0000 mL | Freq: Once | ORAL | 0 refills | Status: AC
Start: 2017-10-28 — End: 2017-10-28

## 2017-10-28 MED ORDER — PEG 3350-KCL-NA BICARB-NACL 420 G PO SOLR: 4000 mL | mL | Freq: Once | ORAL | 0 refills | 0 days | Status: AC

## 2017-10-28 NOTE — Progress Notes (Signed)
This 47 year old [place] speaking patient presents for follow up her history of GERD, Barrett's and a history of abnormal LFT's.     Reviewd the patient's last LFT's from 06/22/17 which were normal. Her most recent cholesterol was high. We discussed this.    She complains of some periumbilical cramping and pain. She has had a trace amount of hematochezia attributed to a hemorrhoid.    The patient denied heartburn, regurgitation, dysphagia, odynophagia, early satiety, weight loss, nausea, vomiting, hematemesis, melena, bloating, eructation, flatulence, diarrhea, bowel urgency, constipation, fever, anemia, or jaundice.    The patient is very anxious.    Patient Active Problem List:     Allergy, unspecified not elsewhere classified     Acute gastritis without mention of hemorrhage     Intestinal infection due to Clostridium difficile     Pure hypercholesterolemia     Major depressive disorder, recurrent episode, moderate with anxious distress (HCC)     Generalized anxiety disorder     Benign positional vertigo     Contraceptive management     Tubular adenoma of colon     GERD (gastroesophageal reflux disease)     Diarrhea     Cholecystitis     Epigastric abdominal pain     Mass of ampulla of Vater     Gastroesophageal reflux disease with esophagitis     Obesity     Colon polyp     Mucosal abnormality of duodenum     Abnormal LFTs     Allergic rhinitis due to pollen     Anisometropic amblyopia of right eye     Barrett's esophagus     Disorder of biliary tract     Gastroesophageal reflux disease     Mild intermittent asthma without complication     Prediabetes     Tinea pedis of both feet     Perimenopause     Hyperprolactinemia (HCC)     BMI 45.0-49.9, adult (HCC)     Hyperlipidemia     ERRONEOUS ENCOUNTER--DISREGARD     Abnormal MRI of head     Headache disorder      Past Surgical History:  No date: CHOLECYSTECTOMY  No date: NO SIGNIFICANT SURGICAL HISTORY  No date: OB ANTEPARTUM CARE CESAREAN DLVR & POSTPARTUM    Review  of Patient's Allergies indicates:   Erythromycin            Itching   Erythromycin            Itching   Macrolides and keto*    Itching, Rash      Social History    Tobacco Use      Smoking status: Never Smoker      Smokeless tobacco: Never Used    Alcohol use: No    Drug use: No        Current Outpatient Medications   Medication Sig    atorvastatin (LIPITOR) 40 MG tablet Take 1 tablet by mouth daily    DULoxetine (CYMBALTA) 30 MG capsule Take 1 each morning.    omeprazole (PRILOSEC) 40 MG capsule Take 1 capsule by mouth daily    sodium chloride (OCEAN) 0.65 % nasal spray 1 spray by Each Nostril route as needed for Congestion    albuterol (PROVENTIL HFA,VENTOLIN HFA, PROAIR HFA) 108 (90 Base) MCG/ACT inhaler Inhale 2 puffs into the lungs every 4 (four) hours as needed for Wheezing or Shortness of breath    loratadine (CLARITIN) 10 MG tablet Take 1 tablet  by mouth daily    albuterol (PROVENTIL) (2.5 MG/3ML) 0.083% nebulizer solution Take 3 mLs by nebulization every 6 (six) hours as needed for Wheezing     No current facility-administered medications for this visit.          Review of patient's family history indicates:  Problem: OTHER      Relation: Father          Age of Onset: (Not Specified)          Comment: leukemia   Problem: Diabetes      Relation: Mother          Age of Onset: (Not Specified)  Problem: Hypertension      Relation: Mother          Age of Onset: (Not Specified)  Problem: Lipids      Relation: Mother          Age of Onset: (Not Specified)  Problem: OTHER      Relation: Mother          Age of Onset: (Not Specified)          Comment: ESRD on HD  Problem: Hypertension      Relation: Father          Age of Onset: (Not Specified)  Problem: Hypertension      Relation: Brother          Age of Onset: (Not Specified)      REVIEW OF SYSTEMS:    Cardiovascular:  No chest pain, palpitations, MI or heart failure. Has hyperlipidemia.  Pulmonary:  No pneumonia, TB or asthma.    Neuro:  No stroke, seizure  or loss of consciousness.   Psych: Anxiety disorder.   Endocrine:  No diabetes or thyroid disease.     Physical Exam:  Vital Signs: BP (!) 138/91 (Site: LA)  Pulse 82  Temp 97.2 F (36.2 C) (Temporal)  Resp 16  Ht 5\' 3"  (1.6 m)  Wt 131.5 kg (290 lb)  SpO2 98%  BMI 51.37 kg/m2 Body mass index is 51.37 kg/m.  General: Morbdity body habitus.   HEENT: Normocephalic, atrumatic, PERRLA, Extra occular motions intact. No scleral icterus. Pharynx benign. Tongue midline.  LN: Without cervical, supraclavicular or infraclavicular lymphadenopathy.  Pulmonary: Clear to auscultation and percussion. No rales, wheezes or rhonchi.  Cardiovascular: S1, S2, no S3 or murmurs.  Abdominal: Normal bowel sounds. No tenderness on light or deep palpation. No hepatomegaly or splenomegaly.  Neurological: Sensation intact. Normal motor function. Normal gait. Alert and oriented x 3      Impression:  Abnormal LFTs  Generalized anxiety disorder  Barrett's esophagus with dysplasia    Medical Decision Making:  The patient was very worried about her liver. Reviewed our most recent findings.    She noted trace hematochezia. Her last colonoscopy was 06/07/14. Follow up was set for 5 yrs but given the bleeding we could do now. She would like to have it checked now.    Check CBC and CMP and lipase.    Reviewed with patient who agrees with our plan. Follow up with me in 3 months.

## 2017-10-28 NOTE — Patient Instructions (Signed)
Verbal and written colon prep instructions reviewed with pt. Procedure to be done with MAC        Colonoscopy Standard       Division of Gastroenterology  [facility]  Phone:__________________  Address: [address], [place], [place] [address]    PLEASE READ CAREFULLY OR HAVE SOMEONE READ THIS TO YOU!      ____________________________________________  Name    Your Colonoscopy test is scheduled at _____________ Hospital    Day_______________     Time to Arrive _______________    Register ______________________            You are having a colonoscopy.  This test lets the doctor see the inside your large intestine (also called your colon) using a small camera on a flexible tube.  There are several reasons to have the test:   *Screening for colon cancers and polyps    *To check unexplained changes in bowel habits   *To evaluate abnormal X-ray findings   *To look for bleeding ulcers or other abnormalities of the colon lining                 HOW TO GET READY FOR THE TEST      Your prescription was sent to your pharmacy or given to you.    Please pick up your prescription within 1 week of receiving these instructions.     IF YOU ARE USING NULYTELY OR A DRUG LIKE IT, DO NOT ADD WATER TO THE PRESCRIPTION UNTIL 1 DAY BEFORE THE DAY OF YOUR EXAM.    Your prescription was sent to: _______________________________________    Call a friend or a family member to make sure you have   someone to take you home after your test.   You must have someone to go home with after the test or we will not be able to give you sedatives!      If you have any questions or worries, or if you are unsure about how to prepare for this test, please call _____________                                  DAY OF THE TEST    4 to 5 hours before your test, drink the remaining half of the bottle of laxative.    It is very important to finish the WHOLE GALLON.    The morning of the test, you can take your other medications at the usual time with  a sip of water. These include blood pressure pills, seizure medications, heart medications, thyroid medications, etc).     Don’t take pills for diabetes. Bring these medicines with you so that you can take them right after your test.      NOTHING to drink for 3 hours before the test.      IMPORTANT INFORMATION About Your Medicines  Based On Recommendations From Experts    IF YOU HAVE ANY CONCERNS ABOUT YOUR MEDICATIONS ASK YOUR OWN DOCTOR. DO NOT WAIT UNTIL THE DAY BEFORE THE TEST!!!!!      Your Medications  You can take your medications for high blood pressure, heart problems, or anxiety with a sip of water at your usual time.  Bring a list of your medications with you.     If you take medicines for Type 2 Diabetes:    One day before the test: If you are taking diabetes pills: Take   PILLS in the morning only. If you take morning insulin: take your usual dose.    On the night before the test: Do not take diabetes pills.  If you take insulin for type 2 diabetes, take ½ your usual long acting insulin.  For example: if you usually take 40 units of Lantus or NPH, take 20 units instead.    On the morning of the test: Do not take diabetes pills. If you are on long-acting insulin for type 2 diabetes, you can take half the dose. For example if you are on 40 units of Lantus or NPH, take 20 units instead.     After the test: You will be allowed to eat normally again. At that time, you should resume taking your diabetes pills at your usual times. If on insulin, take your usual evening dose of insulin. If you can’t eat for any reason, check with your doctor.    If you have Type 1 Diabetes:    One day before the test: Take your usual long-acting basal insulin (Lantus or NPH) and make sure your clear liquid diets contain sugar. Check your blood sugars at meal times and cover with short acting insulin only if blood sugar is over 200. Otherwise do not take your short-acting insulin.    On the night before the test: Just take your  usual basal insulin dose that you would normally take at that time (for example, your usual full dose of Lantus or NPH).    On the morning of the test: Take your usual basal insulin dose that you would normally take at that time (for example, your usual full dose of Lantus or NPH).                            Test Checklist    Please use this list to prepare for your test.     7 days before the test (____/____/____)     STOP taking Motrin, Advil, Naprosyn, Aleve or related drugs.   Continue to take all other prescribed meds.   Ask your doctor about whether you should continue Aspirin. It is sometimes        important to stay on this medication.    3 days before the test (____/____/____)     STOP eating any foods that contain beans, seeds, skins, nuts, or are high in fiber.    2 days before the test (____/____/____)     MAKE SURE YOU HAVE A RIDE ARRANGED    EAT dinner no later than 7 pm.  BEGIN a liquid diet. (Do not eat solids. This includes foods such as rice, pasta, bread and fruit).    1 day before the test (____/____/____)     PREPARE the laxative.   START taking the laxative at 6:00pm.   DRINK ½ the bottle of the laxative.    Day of your test (____/____/____)     FINISH  the rest of the laxative 5 to 6 hours before your test.     DO NOT drink anything 3 hours before the test.      WHAT TO EXPECT ON THE DAY OF YOUR TEST    Colonoscopy  Colonoscopy is a procedure used to see inside the colon and rectum.  During a colonoscopy a flexible tube with a camera and a light is inserted through the rectum. The doctor then examines the large bowel (also called the large intestine or colon).      Colonoscopies can detect inflamed tissue, ulcers and abnormal growths called polyps. Some polyps are cancerous, but most are pre cancerous. These might become dangerous someday. A doctor can usually remove these polyps during the test. They are then sent to a laboratory to be checked. Polyps are common in adults and are generally  harmless. However, most colorectal cancers begin as polyps. Removing them is a good way to prevent cancer. Your doctor may also take biopsies (small pieces of tissue) for analysis in the lab of abnormalities that they want to check in more detail.    It is extremely important to follow all of the steps for cleaning out your colon. If you do not follow all the steps, the doctor may not be able to see clearly. The exam might need to be cancelled and repeated another day. The clear liquids you can take during the clean out are treated as food by your body, so you wont starve or get dehydrated.        Getting Ready At [facility]  On the day of your test, a nurse and doctor will ask you some more information about your health history.  You will be put into a hospital gown. A small intravenous needle will be inserted in the back of your hand or forearm to give you medications that will make you comfortable during the test.    In the procedure room, you will be asked to lie down in a curled position on your left side.  Please inform the doctor if this is uncomfortable for you. You will be given oxygen to breathe. The test usually lasts 30 minutes to an hour. It may last longer if polyps need to be removed or if other abnormalities are noted.      You will be given medication through the IV in order to control discomfort and help you relax.  You may sleep or be partially awake during the test. Sometimes, you might feel a cramping sensation. We will monitor you and try to make you as comfortable as possible. The tube will be inserted into your rectum (back side) and advanced through the large bowel.  The doctor will try and look at all of the inner walls of your colon. The outer wall and organs outside the colon are not visible with this test.    Possible Complications  Complications are unusual during or after the test but they can happen. The most common risks include colonic perforation (a tear in the colon), bleeding,  respiratory problems, blood pressure problems, heart problems, discomfort and adverse reactions to the medications used.  A perforation may result in the need for emergency surgery and a colostomy bag. Also note, colonoscopy like other medical tests is not perfect. It may not detect problems such as polyps, cancers and other diseases up to 2 to 6% of the time. Luckily, the combined risk of all of these problems is small.    After the Test  You may feel bloated from air which was put into your colon during the test. You may also feel a little drowsy from the medications. You cannot drive or operate heavy machinery or do any important work for the rest of the day. You should plan on resting, watching TV or reading light material after the test. You may forget things that happen during and directly after the test. It is important have someone with you that can remind you of any instructions we give.    Depending on what  is found, you may need to have the colonoscopy repeated. Usually, this is done several years later but it may need to be done much sooner. Talk to your doctor about when you should have repeat study or other testing.    You will usually be at the hospital between 2-3 hours (although sometimes it can take longer).  We will make sure that you are alright before sending you home. You must arrange for someone to drive you home after the test.  Once again, we will not perform the test unless you have an arranged ride. You cannot go home in a taxi or a bus.    Colonoscopy is a safe and effective test that is commonly done at our facilities. You may receive a call to remind you of the date and time of your test. If you have any questions, please feel free to call.     For questions about the test itself, call [phone].    For questions about the date and time of your test, or to change the date or time, call [phone]    For questions regarding your regular medications or health issues, please call your  doctor.

## 2017-10-28 NOTE — Addendum Note (Signed)
Addended by: Julien Girt on: 10/28/2017 01:13 PM     Modules accepted: Orders

## 2017-10-29 ENCOUNTER — Encounter (HOSPITAL_BASED_OUTPATIENT_CLINIC_OR_DEPARTMENT_OTHER): Payer: Self-pay

## 2017-10-29 ENCOUNTER — Telehealth (HOSPITAL_BASED_OUTPATIENT_CLINIC_OR_DEPARTMENT_OTHER): Payer: Self-pay

## 2017-10-29 NOTE — Progress Notes (Signed)
Called patient.  No answer  Letter sent       Okay to send letter if does not pick up.   Prolactin level remains normal. We will discuss further at her f/u appt.   Thanks,   TG

## 2017-10-30 ENCOUNTER — Telehealth (HOSPITAL_BASED_OUTPATIENT_CLINIC_OR_DEPARTMENT_OTHER): Payer: Self-pay

## 2017-10-30 NOTE — Progress Notes (Signed)
Call received from pt. Message given.  No other needs at this time

## 2017-11-04 ENCOUNTER — Encounter (HOSPITAL_BASED_OUTPATIENT_CLINIC_OR_DEPARTMENT_OTHER): Payer: No Typology Code available for payment source | Admitting: Psychiatry

## 2017-11-05 ENCOUNTER — Encounter (HOSPITAL_BASED_OUTPATIENT_CLINIC_OR_DEPARTMENT_OTHER): Payer: Self-pay

## 2017-11-05 NOTE — Progress Notes (Signed)
PSYCHIATRY TERMINATION AND TRANSFER NOTE    Transfer Document    Date treatment started: 09/22/17     Transfer/termination date: 11/05/17    Reason for treatment: Major depressive disorder, recurrent episode, moderate with anxious distress ([place])  (primary encounter diagnosis)  GAD    Treatment course (response to medications, compliance): Referred by therapist for depressed mood with anxiety and somatic preoccupations. Cymbalta and vistaril started. On follow up client indicated that she did not try the medication. Psychoeducation provided regarding treatment and medications. Shared that her goal is to get SSI benefits.     Outstanding Issues: Client needs to schedule appt with psych provider.     Safe to refill: yes    Risk level:   Suicide: low  Violence: low  Addiction: low  Protective Factors: stable housing, supportive family, 60 y.o daughter    Plan: Transfer to Dr. Delynn Flavin for continuity of care.    For transfers, the treatment plan will now be the responsibility of: Saralyn Pilar Hunnicutt-therapist

## 2017-11-06 ENCOUNTER — Ambulatory Visit (HOSPITAL_BASED_OUTPATIENT_CLINIC_OR_DEPARTMENT_OTHER): Payer: No Typology Code available for payment source

## 2017-11-13 ENCOUNTER — Ambulatory Visit (HOSPITAL_BASED_OUTPATIENT_CLINIC_OR_DEPARTMENT_OTHER): Payer: No Typology Code available for payment source

## 2017-11-16 ENCOUNTER — Ambulatory Visit: Payer: No Typology Code available for payment source | Attending: "Endocrinology | Admitting: "Endocrinology

## 2017-11-16 ENCOUNTER — Encounter (HOSPITAL_BASED_OUTPATIENT_CLINIC_OR_DEPARTMENT_OTHER): Payer: Self-pay | Admitting: "Endocrinology

## 2017-11-16 DIAGNOSIS — F411 Generalized anxiety disorder: Secondary | ICD-10-CM | POA: Diagnosis present

## 2017-11-16 DIAGNOSIS — R930 Abnormal findings on diagnostic imaging of skull and head, not elsewhere classified: Secondary | ICD-10-CM | POA: Diagnosis present

## 2017-11-16 DIAGNOSIS — E221 Hyperprolactinemia: Secondary | ICD-10-CM | POA: Diagnosis not present

## 2017-11-16 NOTE — Patient Instructions (Signed)
1. Please visit the lab in 3 months for repeat prolactin testing. Any [facility] lab is okay to go to.

## 2017-11-16 NOTE — Progress Notes (Signed)
The patient is a 47 year old female with history of microprolactinoma here for follow up. She was last seen in 08/2017.     HPI: We initially learned of this patient through an e-consult. She presented with oligomenorrhea for 1 year. Prior to then, she had monthly menses for the most part. 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 Her TSH, testosterone, FSH, and estradiol levels were normal. 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 There is no mention of optic nerve involvement, but there is deviation of the stalk to the left.   Denies galactorrhea or breast tenderness. There were no gross neurological deficits on exam.    Stress: Father passed 4 years ago. Mother went on dialysis 2 years ago. Pt has significant anxiety at baseline too.  Marijuana: none  ETOH: none  Chest tattoos: none  Chest pain: none  Headaches: occasional left temporal. Resolves with tylenol.   Menarche: age 20  [id number]  Her daughter is age 66 years. She conceived after 10 years of trying. She was never told her prolactin was elevated in the past.     I started cabergoline 0.25 mg twice a week (Mondays and Thursdays) and her prolactin decreased into the normal range. In addition, full pituitary hormone evaluation was completed and negative for other pituitary hormone excess or deficiency. Periods have resumed and are monthly. Cramping is much less and overall every back to baseline.     Was seen in ED and also by her PCP for depression and anxiety. Now on treatment. Mood has improved. Repeat prolactin level in May was low end of normal. I decreased the cabergoline to 0.25 mg weekly. She repeated her 1 year pituitary MRI and this showed resolution of the pituitary adenoma, however a clivus tumor was noted. We referred her to [facility] and she was seen by Dr. 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 Her cabergoline was stopped. Repeat prolactin level returned normal off treatment.      Today, she reports after stopping the cabergoline she did not have a period for 2 months. She did have a cycle this month for 3 days only. Continues to have hot flashes. No galactorrhea or severe headaches or changes in vision. Has left temporal headaches associated with sinus congestion after cleaning with bleach. Has seen her PCP for this already. Has plan for repeat brain MRI in January at [facility].         Problem History:  Patient Active Problem List:     Allergy, unspecified not elsewhere classified     Acute gastritis without mention of hemorrhage     Intestinal infection due to Clostridium difficile     Pure hypercholesterolemia     Major depressive disorder, recurrent episode, moderate with anxious distress (HCC)     Generalized anxiety disorder     Benign positional vertigo     Contraceptive management     Tubular adenoma of colon     GERD (gastroesophageal reflux disease)     Diarrhea     Cholecystitis     Epigastric abdominal pain     Mass of ampulla of Vater     Gastroesophageal reflux disease with esophagitis     Obesity     Colon polyp     Mucosal abnormality of duodenum     Abnormal LFTs  Allergic rhinitis due to pollen     Anisometropic amblyopia of right eye     Barrett's esophagus     Disorder of biliary tract     Gastroesophageal reflux disease     Mild intermittent asthma without complication     Prediabetes     Tinea pedis of both feet     Perimenopause     Hyperprolactinemia (HCC)     BMI 45.0-49.9, adult (HCC)     Hyperlipidemia     ERRONEOUS ENCOUNTER--DISREGARD     Abnormal MRI of head     Headache disorder      Past Medical History:   Past Medical History:  06/12/2004: ACUTE GASTRITIS W/O HEMORRHAGE      Comment:  H. Pylori positive (done at [facility]); GI consult Dr.               Phillis Knack, TOC 10/01/04  03/26/2004: ALLERGY, UNSPECIFIED      Comment:  Seen by ENT, deviated septum,  hypertrophy of inferior                nasal turbinates  No date: Anxiety  No date: Asthma  07/26/2004: CLOSTRIDIUM DIFFICILE      Comment:  GI cosult: Dr. Rollene Rotunda, TOC done on 10/01/04  No date: Elevated cholesterol      Comment:  resolved without meds  No date: Esophageal reflux  No date: Snores  No date: Stomach disease  No date: Unspecified asthma(493.90)  No date: Wears eyeglasses      Comment:  night driving    Active Medication:   Reviewed and updated.     Allergies:   Review of Patient's Allergies indicates:   Erythromycin            Itching   Erythromycin            Itching   Macrolides and keto*    Itching, Rash    Social history:  Social History     Socioeconomic History   . Marital status: Married     Spouse name: Not on file   . Number of children: Not on file   . Years of education: Not on file   . Highest education level: Not on file   Occupational History   . Not on file   Social Needs   . Financial resource strain: Not on file   . Food insecurity:     Worry: Not on file     Inability: Not on file   . Transportation needs:     Medical: Not on file     Non-medical: Not on file   Tobacco Use   . Smoking status: Never Smoker   . Smokeless tobacco: Never Used   Substance and Sexual Activity   . Alcohol use: No   . Drug use: No   . Sexual activity: Yes     Partners: Male     Comment: men age 47 q 4-6 wks x 3d, small fibroid on u/s, 1st SA age 34, 2 lifetime partners, current x 8 yrs   Lifestyle   . Physical activity:     Days per week: Not on file     Minutes per session: Not on file   . Stress: Not on file   Relationships   . Social connections:     Talks on phone: Not on file     Gets together: Not on file     Attends religious service: Not on file  Active member of club or organization: Not on file     Attends meetings of clubs or organizations: Not on file     Relationship status: Not on file   . Intimate partner violence:     Fear of current or ex partner: Not on file     Emotionally abused: Not on  file     Physically abused: Not on file     Forced sexual activity: Not on file   Other Topics Concern   . Not on file   Social History Narrative    07/09/2005    [name] family at age 50, husband from [place]    Works [employer]    Pt thrilled with pregnancy-seeking pregnancy x 4 yrs, very anxious since sab 2 yrs ago    Has one child    Not working now    Lives with husband and daughter    Alton Revere, MD, 05/13/2016, 1:50 PM   Accompanied by her sister. Unchanged.    Family History:   Review of patient's family history indicates:  Problem: OTHER      Relation: Father          Age of Onset: (Not Specified)          Comment: leukemia   Problem: Diabetes      Relation: Mother          Age of Onset: (Not Specified)  Problem: Hypertension      Relation: Mother          Age of Onset: (Not Specified)  Problem: Lipids      Relation: Mother          Age of Onset: (Not Specified)  Problem: OTHER      Relation: Mother          Age of Onset: (Not Specified)          Comment: ESRD on HD  Problem: Hypertension      Relation: Father          Age of Onset: (Not Specified)  Problem: Hypertension      Relation: Brother          Age of Onset: (Not Specified)  No history of pituitary disease. Unchanged.    Review of systems:  Rest of review of systems are negative except as stated in HPI.     Physical Exam:   GEN: Appears well. Not that anxious today    Most Recent Weight Reading(s)  10/28/17 : 131.5 kg (290 lb)  10/27/17 : 132 kg (291 lb)  09/17/17 : 129.7 kg (286 lb)  09/09/17 : 126.1 kg (278 lb)  09/04/17 : 130.6 kg (288 lb)    Laboratory:  Component      Latest Ref Rng & Units 02/09/2017   PROLACTIN      2.2 - 30.3 ng/mL 4.3     Component      Latest Ref Rng & Units 11/11/2016   ADRENOCORTICOTROPIC HORMONE      7.2 - 63.3 pg/mL 60.8   CORTISOL AM      4.30 - 22.40 ug/dL 13.68   FREE THYROXINE      0.76 - 1.46 ng/dL 0.78   SOMATOMEDIN C (IGF-1)      57 - 195 ng/mL 118      Component      Latest Ref Rng & Units 08/18/2016   THYROID SCREEN TSH REFLEX FT4  0.358 - 3.740 uIU/mL 2.410   PROLACTIN      2.2 - 30.3 ng/mL 814.4 (H)   FOLLICLE STIMULATING HORMONE      mIU/mL 8.0   LUTEINIZING HORMONE (LH)      mIU/ml    DHEA SULFATE      45 - 270 ug/dL    TSH (THYROID STIM HORMONE)      0.34 - 5.60 uIU/ml    ESTRADIOL      . pg/mL 65.2     Component      Latest Ref Rng & Units 01/22/2004   THYROID SCREEN TSH REFLEX FT4      0.358 - 3.740 uIU/mL 2.15   PROLACTIN      2.2 - 30.3 ng/mL 81.85   FOLLICLE STIMULATING HORMONE      mIU/mL 4.28   LUTEINIZING HORMONE (LH)      mIU/ml 4.25   DHEA SULFATE      45 - 270 ug/dL 140     Component      Latest Ref Rng & Units 08/27/2017 10/28/2017   PROLACTIN      2.2 - 30.3 ng/mL 0.8 (L) 22.0       Assessment/plan: The patient is a 47 year old female with history of clivus lesion causing pituitary stalk deviation, hyperprolactinemia, and oligomenorrhea here for follow up.     After almost 1 year of treatment with cabergoline, periods resumed and were regular. However, later this Summer, she started having hot flashes and irregular periods. [place] level was upper normal range suggestive of perimenopause. This is in the setting of low prolactin. Repeat prolactin this month is normal and periods still irregular. I explained these symptoms are likely due to perimenopause.     For her hyperprolactinemia, I will recheck her prolactin level in 2 months. If normal, will continue to monitor. If high, will restart cabergoline for 0.25 mg twice a MONTH. I would stop treatment once age 46 or when enters menopause.     Regarding the clivus lesion, Dr. Joycelyn Rua provided reassurance that most of the time these are benign lesions. The mass has remained stable in size over 1 year. Other pituitary hormone labs were normal. Plans to have repeat MRI in January Will defer to Dr. Joycelyn Rua regarding follow up of the lesion.     The patient understands and agrees with the plan. All  questions were answered.       Follow up in 3-4 months.     Richardson Landry, MD    This visit lasted 20 minutes with greater than 50% of the visit spent on counseling and coordination of care.

## 2017-11-20 ENCOUNTER — Ambulatory Visit (HOSPITAL_BASED_OUTPATIENT_CLINIC_OR_DEPARTMENT_OTHER): Payer: No Typology Code available for payment source

## 2017-11-23 ENCOUNTER — Other Ambulatory Visit (HOSPITAL_BASED_OUTPATIENT_CLINIC_OR_DEPARTMENT_OTHER): Payer: Self-pay | Admitting: Gastroenterology

## 2017-11-23 DIAGNOSIS — R1110 Vomiting, unspecified: Secondary | ICD-10-CM

## 2017-11-23 NOTE — Progress Notes (Signed)
Patient called today. Left message that she was having nausea, cramping and bad breath. She has known Barrett's and her last EGD was in 06/2016. Requested EGD with her pending colonoscopy. This will be added.

## 2017-11-27 ENCOUNTER — Ambulatory Visit (HOSPITAL_BASED_OUTPATIENT_CLINIC_OR_DEPARTMENT_OTHER): Payer: No Typology Code available for payment source

## 2017-12-01 ENCOUNTER — Ambulatory Visit (HOSPITAL_BASED_OUTPATIENT_CLINIC_OR_DEPARTMENT_OTHER): Payer: No Typology Code available for payment source

## 2017-12-02 ENCOUNTER — Ambulatory Visit: Payer: No Typology Code available for payment source | Attending: Psychiatry

## 2017-12-02 DIAGNOSIS — F331 Major depressive disorder, recurrent, moderate: Secondary | ICD-10-CM | POA: Insufficient documentation

## 2017-12-02 DIAGNOSIS — F411 Generalized anxiety disorder: Secondary | ICD-10-CM | POA: Diagnosis present

## 2017-12-02 NOTE — Progress Notes (Signed)
OUTPATIENT PSYCHIATRY PROGRESS NOTE    INTERPRETER: No    CONTACT INFO FOR OTHER AGENCIES AND MENTAL HEALTH PROVIDERS (IF APPLICABLE): N/A    PROBLEM(S) ADDRESSED IN THIS SESSION:   - Depressed mood  - Anxiety symptoms and ruminative worries around health    SUBJECTIVE  TODAY'S CHIEF COMPLAINT AND CLINICAL UPDATES IN PATIENT'S WORDS:  1) Chief Complaint (Patient and/or guardian's own words, concerns and expressed thoughts): "I'm worried about losing my best friend (mother)", "I'm worried about having cancer".    2) New information from patient and/or collateral (Patient's illness: context, course, modifying factors, severity, cultural, family, social, medical history):   Alejandra Martin presents with symptoms of depression and anxiety in the context of feeling sad, having low interest in doing things, low self esteem and having multiple worries about having cancer, and losing her mother who suffer from kidney disease and is currently receiving dialysis.       OBJECTIVE  DATA REVIEWED (Consider medical labs, radiology, other medical tests; screening/outcome measures; psychological testing; discussion of test results with other clinicians; consultation with other clinicians and systems involved with patient, summary of old records): Alejandra Martin will have an endoscopy due to stomach problems at the end of October 2019.     CURRENT MEDICATIONS (make clear medications prescribed by psychiatry; include OTC medications):  Current Outpatient Medications   Medication Sig    atorvastatin (LIPITOR) 40 MG tablet Take 1 tablet by mouth daily    DULoxetine (CYMBALTA) 30 MG capsule Take 1 each morning.    omeprazole (PRILOSEC) 40 MG capsule Take 1 capsule by mouth daily    albuterol (PROVENTIL HFA,VENTOLIN HFA, PROAIR HFA) 108 (90 Base) MCG/ACT inhaler Inhale 2 puffs into the lungs every 4 (four) hours as needed for Wheezing or Shortness of breath    loratadine (CLARITIN) 10 MG tablet Take 1 tablet by mouth daily    albuterol  (PROVENTIL) (2.5 MG/3ML) 0.083% nebulizer solution Take 3 mLs by nebulization every 6 (six) hours as needed for Wheezing     No current facility-administered medications for this visit.        MEDICATION ADHERENCE (including barriers and how addressed): n/a  MEDICATION SIDE EFFECTS (Prescribers Only): N/A    BIRTH CONTROL (ask females and males): Deferred    CURRENT PREGNANCY: No                                    MENTAL STATUS EXAMINATION                     General Appearance: Alejandra Martin appearing well groomed, dressed casually     Interaction with Interviewer (eye contact, attitude, behavior): Within normal limits    Physical Signs    Gait and Station (how patient walks and stands): Within normal limits                  Physical Appearance: Within normal limits          Normal Movements: Within normal limits         Speech (rate, volume, articulation): Within normal limits          Language: [place] first language, speaks [place] fluently                       Mood: "I'm worried that I won't be able to raise my daughter"  Affect: dysthymic, anxious    Thought Process     Rate; concrete vs abstract reasoning: Within normal limits        Logical vs illogical; associations: loose, tangential, circumstantial, intact: Within normal limits                                    Thought Content    Normal Thought Content (other than safety): Within normal limits     Perceptions (auditory, visual, tactile, etc.): None       Impulse Control: Good         Cognition (Link to [facility])    Orientation (person, place, time): oriented x3      Recent and remote memory: Within normal limits           Attention span and concentration: Within normal limits         Fund of knowledge, awareness of current events and vocabulary: Within normal limits      Judgment: Good          Insight: Good        Suicidality/Homicidality/Aggression (Victimization or Perpetration): None. Alejandra Martin describes religion as protective factor to suicide.       ASSESSMENT   Today's Assessment:  Alejandra Martin appearing dysthymic, anxious, due to several life stressors in the past and current physical symptoms. No SI.HI.     Risk Level Assessment  Risk Level Change (if yes, please describe): No    Suicide: low (1)  Violence: low (1)  Addiction: low (1)        Protective Factors: Religious faith, primary supports, housing, hard working    [place] (psychiatric diagnoses and medical diagnoses that factor into management of psychiatric treatment): Major Depressive Disorder, recurrent episode, moderate with anxious distress  Generalized Anxiety Disorder    CLINICAL FORMULATION (Make changes as your understanding changes. Should coincide with treatment plan): Alejandra Martin is a 47yo [place] married mother of an 61yo daughter suffering from depression and anxiety symptoms. These symptoms appear to be related to health concerns, originating from the time when her daughter was born with bad acid reflux resulting in necessary hospital care of her newborn. Depressive and anxiety symptoms appear to have worsened in the context of father's multiple cancer diagnosis, and following his death approximately five years ago. Alejandra Martin's mother is also now ill, and is currently in dialysis. Alejandra Martin may benefit from therapy in order to process losses, and develop skills in which to manage anxiety and depression.      REVIEWING TODAY'S VISIT  CLINICAL INTERVENTIONS TODAY: Active listening, exploratory questions, validation, connecting thoughts/ feelings/ behaviors, psychoeducation about exercise/ diet/ sleep, sleep hygiene, Identifying unhelpful thought patterns exercise, mindfulness and self soothing.    PATIENT'S RESPONSE TO INTERVENTIONS: Alejandra Martin talkative and engaged    PROGRESS TOWARDS GOALS: Slow    TIME SPENT IN PSYCHOTHERAPY: 45 minutes    PLAN  PLAN FOR MANAGING RISK (Consider risk plan for patients at moderate or high risk for suicide/violence/addiction; medication plan; referrals, etc. Must coincide  with treatment plan.): Continue to monitor for changes in risk.    PLAN FOR ONGOING TREATMENT:  Weekly psychotherapy    INFORMED CONSENT (for any new treatment): Patient was informed of the potential risks and benefits of the treatment, including the option not to treat, and appeared to understand and agreed to comply. Discussion included the following key points: Confidentiality, frame of treatment.  ONLY FOR PRESCRIBERS DOING EVALUATION AND MANAGEMENT VISITS     Use only when no psychotherapy performed  COUNSELING AND COORDINATION OF CARE PROVIDED (Consider diagnostic results/impressions and/or recommended studies; risks and benefits of treatment options; instruction for management/treatment and/or follow-up; importance of compliance with chosen treatment options; risk factor reduction; patient/family/caregiver education; prognosis): N/A      Over 50% of time during today's visit was devoted to counseling and/or coordination of care.  If yes, record estimated duration of the face to face encounter.  N/A    INSTRUCTIONS TO COVERING CLINICIANS:  N/A

## 2017-12-08 ENCOUNTER — Encounter (HOSPITAL_BASED_OUTPATIENT_CLINIC_OR_DEPARTMENT_OTHER): Payer: Self-pay

## 2017-12-08 NOTE — Progress Notes (Signed)
PSYCHIATRY TERMINATION AND TRANSFER NOTE    Transfer Document    Date treatment started: 03/24/17    Transfer/termination date: 12/01/17    Reason for treatment: Anxiety, grief, depressive symptoms    Treatment course (response to medications, compliance): Pt attended 13 appointments, with approximately as many no shows as made appointments. When pt did attend appointments or speak with me over the phone, she expressed her feeling that therapy was helpful and she needed this service. Pt was not very reflective about what may contribute to periods of multiple no shows vs made appointments, despite her always having a stated reason why did wasn't able to come. Due to pt's primary language spoken being [place], I had a question of whether she may feel more engaged with a [place] speaking therapist.    Outstanding Issues: Significant anxiety and depressive symptoms which she says interfere with her ability to work and complete daily responsibilities.    Safe to refill: N/A    Risk level: Low risk on all parameters (suicide/ violence/ substance abuse)    Plan: Pt will be transferred to [place] speaking therapist Otila Back. Pt informed of the importance of attending appointments and that if she continues to have significant no show issues that there may need to be an assessment about the appropriateness of frequency/ duration of service due to her not being able to maintain appointments.     For transfers, the treatment plan will now be the responsibility of: Natalia Candee Furbish    (Clinician, please route/cc this encounter to your team/program administrative coordinator so that the treatment plan database can be updated.).

## 2017-12-09 ENCOUNTER — Ambulatory Visit (HOSPITAL_BASED_OUTPATIENT_CLINIC_OR_DEPARTMENT_OTHER): Payer: No Typology Code available for payment source

## 2017-12-10 ENCOUNTER — Other Ambulatory Visit (HOSPITAL_BASED_OUTPATIENT_CLINIC_OR_DEPARTMENT_OTHER): Payer: Self-pay | Admitting: Physician Assistant

## 2017-12-10 DIAGNOSIS — K2270 Barrett's esophagus without dysplasia: Secondary | ICD-10-CM

## 2017-12-10 DIAGNOSIS — K219 Gastro-esophageal reflux disease without esophagitis: Secondary | ICD-10-CM

## 2017-12-11 NOTE — Progress Notes (Signed)
PER Pharmacy, Alejandra Martin is a 47 year old female has requested a refill of Prilosec.      Last Office Visit: 10/27/2017 with pcp  Last Physical Exam: 11/16/2003    There are no preventive care reminders to display for this patient.    Other Med Adult:  Most Recent BP Reading(s)  10/28/17 : (!) 138/91        Cholesterol (mg/dL)   Date Value   07/03/2017 267 (*H)     LOW DENSITY LIPOPROTEIN DIRECT (mg/dL)   Date Value   07/03/2017 196 (*H)     HIGH DENSITY LIPOPROTEIN (mg/dL)   Date Value   07/03/2017 52     TRIGLYCERIDES (mg/dL)   Date Value   07/03/2017 165 (H)         THYROID SCREEN TSH REFLEX FT4 (uIU/mL)   Date Value   08/27/2017 2.818         TSH (THYROID STIM HORMONE) (uIU/ml)   Date Value   06/28/2004 1.41       HEMOGLOBIN A1C (%)   Date Value   07/03/2017 5.6       No results found for: POCA1C      INR (no units)   Date Value   04/28/2008 1.0 (L)   09/24/2006 1.0 (L)       SODIUM (mmol/L)   Date Value   10/28/2017 137       POTASSIUM (mmol/L)   Date Value   10/28/2017 4.3           CREATININE (mg/dL)   Date Value   10/28/2017 0.7       Documented patient preferred pharmacies:    [facility] [place], [place] AT [facility] [address]  Phone: [phone] Fax: [phone]

## 2017-12-15 ENCOUNTER — Encounter (HOSPITAL_BASED_OUTPATIENT_CLINIC_OR_DEPARTMENT_OTHER): Payer: Self-pay

## 2017-12-15 ENCOUNTER — Ambulatory Visit
Admission: RE | Admit: 2017-12-15 | Discharge: 2017-12-15 | Disposition: A | Discharge status: Home / Self Care | Payer: No Typology Code available for payment source | Attending: Gastroenterology | Admitting: Gastroenterology

## 2017-12-15 ENCOUNTER — Encounter (HOSPITAL_BASED_OUTPATIENT_CLINIC_OR_DEPARTMENT_OTHER): Payer: Self-pay | Admitting: Anesthesiology

## 2017-12-15 ENCOUNTER — Ambulatory Visit (HOSPITAL_BASED_OUTPATIENT_CLINIC_OR_DEPARTMENT_OTHER): Payer: Self-pay | Admitting: Anesthesiology

## 2017-12-15 DIAGNOSIS — R1110 Vomiting, unspecified: Secondary | ICD-10-CM | POA: Diagnosis not present

## 2017-12-15 DIAGNOSIS — Z1211 Encounter for screening for malignant neoplasm of colon: Secondary | ICD-10-CM | POA: Diagnosis not present

## 2017-12-15 DIAGNOSIS — D122 Benign neoplasm of ascending colon: Secondary | ICD-10-CM | POA: Insufficient documentation

## 2017-12-15 DIAGNOSIS — R1084 Generalized abdominal pain: Secondary | ICD-10-CM | POA: Insufficient documentation

## 2017-12-15 DIAGNOSIS — K921 Melena: Secondary | ICD-10-CM | POA: Diagnosis not present

## 2017-12-15 DIAGNOSIS — F411 Generalized anxiety disorder: Secondary | ICD-10-CM | POA: Diagnosis not present

## 2017-12-15 DIAGNOSIS — K644 Residual hemorrhoidal skin tags: Secondary | ICD-10-CM | POA: Diagnosis not present

## 2017-12-15 DIAGNOSIS — K22719 Barrett's esophagus with dysplasia, unspecified: Secondary | ICD-10-CM | POA: Insufficient documentation

## 2017-12-15 DIAGNOSIS — Z881 Allergy status to other antibiotic agents status: Secondary | ICD-10-CM | POA: Diagnosis not present

## 2017-12-15 DIAGNOSIS — K210 Gastro-esophageal reflux disease with esophagitis: Secondary | ICD-10-CM | POA: Diagnosis not present

## 2017-12-15 DIAGNOSIS — R1013 Epigastric pain: Secondary | ICD-10-CM | POA: Diagnosis not present

## 2017-12-15 DIAGNOSIS — Z8619 Personal history of other infectious and parasitic diseases: Secondary | ICD-10-CM | POA: Diagnosis not present

## 2017-12-15 DIAGNOSIS — K839 Disease of biliary tract, unspecified: Secondary | ICD-10-CM

## 2017-12-15 DIAGNOSIS — Z79899 Other long term (current) drug therapy: Secondary | ICD-10-CM | POA: Diagnosis not present

## 2017-12-15 DIAGNOSIS — K2270 Barrett's esophagus without dysplasia: Secondary | ICD-10-CM | POA: Diagnosis not present

## 2017-12-15 DIAGNOSIS — D123 Benign neoplasm of transverse colon: Secondary | ICD-10-CM | POA: Insufficient documentation

## 2017-12-15 DIAGNOSIS — R945 Abnormal results of liver function studies: Secondary | ICD-10-CM | POA: Diagnosis not present

## 2017-12-15 DIAGNOSIS — R1033 Periumbilical pain: Secondary | ICD-10-CM

## 2017-12-15 DIAGNOSIS — Z86010 Personal history of colonic polyps: Secondary | ICD-10-CM | POA: Insufficient documentation

## 2017-12-15 DIAGNOSIS — K2950 Unspecified chronic gastritis without bleeding: Secondary | ICD-10-CM | POA: Insufficient documentation

## 2017-12-15 DIAGNOSIS — Z888 Allergy status to other drugs, medicaments and biological substances status: Secondary | ICD-10-CM | POA: Insufficient documentation

## 2017-12-15 DIAGNOSIS — Z6841 Body Mass Index (BMI) 40.0 and over, adult: Secondary | ICD-10-CM | POA: Diagnosis not present

## 2017-12-15 DIAGNOSIS — F331 Major depressive disorder, recurrent, moderate: Secondary | ICD-10-CM | POA: Diagnosis not present

## 2017-12-15 DIAGNOSIS — E7800 Pure hypercholesterolemia, unspecified: Secondary | ICD-10-CM | POA: Diagnosis not present

## 2017-12-15 DIAGNOSIS — K838 Other specified diseases of biliary tract: Secondary | ICD-10-CM

## 2017-12-15 LAB — COMPREHENSIVE METABOLIC PANEL
ALANINE AMINOTRANSFERASE: 33 U/L (ref 12–45)
ALBUMIN: 3.6 g/dL (ref 3.4–5.0)
ALKALINE PHOSPHATASE: 107 U/L (ref 45–117)
ANION GAP: 15 mmol/L (ref 5–15)
ASPARTATE AMINOTRANSFERASE: 27 U/L (ref 8–34)
BILIRUBIN TOTAL: 0.4 mg/dL (ref 0.2–1.0)
BUN (UREA NITROGEN): 12 mg/dL (ref 7–18)
CALCIUM: 8.8 mg/dL (ref 8.5–10.1)
CARBON DIOXIDE: 30 mmol/L (ref 21–32)
CHLORIDE: 101 mmol/L (ref 98–107)
CREATININE: 0.8 mg/dL (ref 0.4–1.2)
ESTIMATED GLOMERULAR FILT RATE: >60 mL/min (ref >60)
Glucose Random: 100 mg/dL (ref 74–160)
POTASSIUM: 4.4 mmol/L (ref 3.5–5.1)
SODIUM: 146 mmol/L — ABNORMAL HIGH (ref 136–145)
TOTAL PROTEIN: 7.5 g/dL (ref 6.4–8.2)

## 2017-12-15 LAB — LIPASE: LIPASE: 73 U/L (ref 73–393)

## 2017-12-15 MED ORDER — LIDOCAINE HCL (PF) 2 % IJ SOLN
Freq: Once | INTRAMUSCULAR | Status: DC | PRN
Start: 2017-12-15 — End: 2017-12-15
  Administered 2017-12-15: 5 mL via INTRAVENOUS

## 2017-12-15 MED ORDER — LACTATED RINGERS IV SOLN
INTRAVENOUS | Status: DC
Start: 2017-12-15 — End: 2017-12-16
  Administered 2017-12-15: 400 mL via INTRAVENOUS
  Administered 2017-12-15: 10:00:00 via INTRAVENOUS

## 2017-12-15 MED ORDER — PROPOFOL INFUSION
INTRAVENOUS | Status: DC | PRN
Start: 2017-12-15 — End: 2017-12-15
  Administered 2017-12-15: 75 ug/kg/min via INTRAVENOUS
  Administered 2017-12-15: 120 ug/kg/min via INTRAVENOUS

## 2017-12-15 MED ORDER — LACTATED RINGERS IV SOLN
INTRAVENOUS | Status: DC
Start: 2017-12-15 — End: 2017-12-15

## 2017-12-15 MED ORDER — LIDOCAINE HCL (PF) 2 % IJ SOLN
INTRAMUSCULAR | Status: AC
Start: 2017-12-15 — End: 2017-12-15
  Filled 2017-12-15: qty 5

## 2017-12-15 MED ORDER — PROPOFOL 200 MG/20ML IV EMUL
INTRAVENOUS | Status: AC
Start: 2017-12-15 — End: 2017-12-15
  Filled 2017-12-15: qty 20

## 2017-12-15 MED ORDER — PROPOFOL 200 MG/20ML IV EMUL
Freq: Once | INTRAVENOUS | Status: DC | PRN
Start: 2017-12-15 — End: 2017-12-15
  Administered 2017-12-15: 100 mg via INTRAVENOUS

## 2017-12-15 MED ORDER — PROPOFOL 200 MG/20ML IV EMUL
INTRAVENOUS | Status: AC
Start: 2017-12-15 — End: 2017-12-15
  Filled 2017-12-15: qty 40

## 2017-12-15 NOTE — PROVATION-GI (Signed)
[facility]  Patient Name: Alejandra Martin  MRN: [id number]  CSN: [id number]  Date of Birth: Dec 11, 1970  Admit Type: Outpatient  Age: 47  Gender: Female  Note Status: Finalized  Multiple Proc?: Has a colonoscopy today  Patient Location: [facility]  Referring MD:         Janann August, MD  Procedure Date:       12/15/2017 9:21:51 AM  Procedure:            Upper GI endoscopy  Endoscopist:          Lucio Edward, MD  Indications for Procedure:       Generalized abdominal pain, Barrett's esophagus, Follow-up of Barrett's        esophagus  Medications:          Monitored Anesthesia Care  Procedure:       Pre-Anesthesia Assessment:       - Prior to the procedure, a History and Physical was performed, and patient        medications and allergies were reviewed. The patient is competent. The risks        and benefits of the procedure and the sedation options and risks were        discussed with the patient. All questions were answered and informed consent        was obtained. Patient identification and proposed procedure were verified by        the physician, the nurse, the anesthesiologist and the technician in the        procedure room. Mental Status Examination: alert and oriented. Airway        Examination: normal oropharyngeal airway and neck mobility and Mallampati        Class II (the uvula but not tonsillar pillars visualized). Respiratory        Examination: clear to auscultation. CV Examination: normal. Prophylactic        Antibiotics: The patient does not require prophylactic antibiotics. Prior        Anticoagulants: The patient has taken no previous anticoagulant or        antiplatelet agents. ASA Grade Assessment: III - A patient with severe        systemic disease. After reviewing the risks and benefits, the patient was        deemed in satisfactory condition to undergo the procedure. The anesthesia        plan was to use monitored anesthesia care (MAC). Immediately prior to        administration of  medications, the patient was re-assessed for adequacy to        receive sedatives. The heart rate, respiratory rate, oxygen saturations,        blood pressure, adequacy of pulmonary ventilation, and response to care were        monitored throughout the procedure. The physical status of the patient was        re-assessed after the procedure.       Just prior to the procedure, an updated history and physical was done. I        obtained an informed consent from the patient reviewing the risk of the        procedure including (but not limited to) respiratory depression, perforation,        bleeding, discomfort, a possible need for surgery and unexpected reactions to        medications. The patient is aware that test has limitations and may not  detect significant lesions such as cancer or other potential diseases. The        patient was also informed that they might need a repeat upper endoscopy        earlier than standard guidelines if there are changes in their symptoms or        concerning findings noted. A time out was performed with the entire procedure        staff present. The scope was passed under direct vision. Throughout the        procedure, the patient's blood pressure, pulse, and oxygen saturations were        monitored continuously. The [id number] was introduced through the        mouth, and advanced to the third part of duodenum. The upper GI endoscopy was        accomplished without difficulty. The patient tolerated the procedure well.        The total duration of the procedure was 5 minutes.  Findings:       The oropharynx was normal.       LA Grade A (one or more mucosal breaks less than 5 mm, not extending between        tops of 2 mucosal folds) esophagitis with no bleeding was found 41 to 42 cm        from the incisors. This was biopsied with a cold forceps for histology and        evaluation to rule out Barrett's Esophagus. Estimated blood loss was minimal.       No gross lesions were  noted in the gastric body and in the gastric antrum.        This was biopsied with a cold forceps for histology and Helicobacter pylori        testing. Estimated blood loss was minimal.       No gross lesions were noted in the third portion of the duodenum. This was        biopsied with a cold forceps for histology and evaluation of celiac disease.        Estimated blood loss was minimal. Ampulla of Vater appears altered in        position. Pancratic divisum?       The cardia and gastric fundus were normal on retroflexion.       The exam was otherwise without abnormality.  Post Procedure Diagnosis:       - Normal oropharynx.       - LA Grade A reflux esophagitis. Rule out Barrett's esophagus. Biopsied.       - No gross lesions in the stomach. Biopsied.       - No gross lesions in the third portion of the duodenum. Biopsied.       - The examination was otherwise normal.  Complications:        No immediate complications. Estimated blood loss: Minimal.  Estimated Blood Loss: Estimated blood loss was minimal.  Recommendation:       - Continue present medications.       - No aspirin, ibuprofen, naproxen, or other non-steroidal anti-inflammatory        drugs for 1 week after biopsy.       - Perform a colonoscopy today.       - Repeat upper endoscopy in 3 years for surveillance.  Lucio Edward, MD  12/15/2017 10:56:25 AM  This report has been signed electronically.  Number of Addenda: 0  Note Initiated  On: 12/15/2017 9:21 AM

## 2017-12-15 NOTE — Discharge Instructions (Signed)
GI CENTER DISCHARGE INSTRUCTIONS     When you return home, you may feel sleepy. Get plenty of rest for the remainder of the day.     If you received sedation for your procedure DO NOT DRIVE, OPERATE MACHINERY OR MAKE IMPORTANT DECISIONS for the reminder of the day.     It is normal after having a COLONOSCOPY to feel a little gassy and bloated, but if you develop SEVERE ABDOMINAL PAIN call your doctor immediately.     It is normal after having a COLONOSCOPY to see a small amount of blood after your first few bowel movements, but if you see a LARGE AMOUNT OF BRIGHT RED BLOOD, call your doctor immediately.     It is normal after having an UPPER ENDOSCOPY to develop a mild sore throat that will last a few days.     If you had a biopsy or Polypectomy you will need o hold aspirin or medications containing aspirin for  7  day/s.     If you had a biopsy or Polypectomy you will need to hold any medication such as Advil, Motrin, Naproxin, Ibuprofen etc. For   7  days.      Call your doctor immediately if you develop:   -  Chest Pain   -  Shortness of breath   -  Difficulty swallowing   -  Vomit bright red blood   -  Notice your bowel movements are black or maroon colored   -  You feel weak and tired     Call your physician for any unusual symptoms.     If for any reason you are unable to reach your doctor go to the nearest         [place].     SPECIFIC INSTRUCTIONS:  *** ORDER FOR MRI GIVEN TO PATIENT.     Follow-up appointment: *** Monday Dec 30th at 2;45 PM    Prescription: {yes [id number]}   Date MD Name: Telephone:   12/15/2017 No att. providers found ***

## 2017-12-15 NOTE — Anesthesia Postprocedure Evaluation (Signed)
Anesthesia Post-Operative Evaluation Note    Patient: Alejandra Martin           Procedure Summary     Date:  12/15/17 Room / Location:  [facility] - Gastroenterology    Anesthesia Start:  [id number] Anesthesia Stop:  0093    Procedures:       COLONOSCOPY      EGD Diagnosis:       Umbilical pain      Hematochezia      Regurgitation of stomach contents    Scheduled Providers:  Lucio Edward, MD Responsible Provider:  Barton Dubois    Anesthesia Type:  general, MAC ASA Status:  3            POST-OPERATIVE EVALUATION    Anesthesia Post Evaluation    Vitals signs in patient's normal range: Yes  Respiratory function stable; airway patent: Yes  Cardiovascular function stable: Yes  Hydration status stable: Yes  Mental status recovered; patient participates in evaluation and/or is at baseline: Yes  Pain control satisfactory: Yes  Nausea and vomiting control satisfactory: Yes    Procedure was labor & delivery no  PostOP disposition PACU  Anesthesia Observation no significant observation      Last vitals  BP   152/89 (12/15/17 1041)    Temp        Pulse   98 (12/15/17 1041)   Resp   19 (12/15/17 1041)    SpO2   100 % (12/15/17 1041)

## 2017-12-15 NOTE — Anesthesia Preprocedure Evaluation (Signed)
Pre-Anesthetic Note        Patient: Alejandra Martin is a 47 year old female      Procedure Information     Date/Time:  12/15/17 0940    Scheduled providers:  Lucio Edward, MD    Procedures:       COLONOSCOPY      EGD    Diagnosis:       Umbilical pain [Q68.34]      Hematochezia [K92.1]      Regurgitation of stomach contents [R11.10]    Location:  [facility] - Gastroenterology          Relevant Problems   PULMONARY   (+) Mild intermittent asthma without complication      GI   (+) GERD (gastroesophageal reflux disease)   (+) Gastroesophageal reflux disease   (+) Gastroesophageal reflux disease with esophagitis       Previous Anesthetic History:   Past Surgical History:  No date: CHOLECYSTECTOMY  No date: NO SIGNIFICANT SURGICAL HISTORY  No date: OB ANTEPARTUM CARE CESAREAN DLVR & POSTPARTUM    Current Medications:      Current Outpatient Medications:  omeprazole (PRILOSEC) 40 MG capsule Take 1 capsule by mouth daily Disp: 30 capsule Rfl: 2   atorvastatin (LIPITOR) 40 MG tablet Take 1 tablet by mouth daily Disp: 30 tablet Rfl: 3   DULoxetine (CYMBALTA) 30 MG capsule Take 1 each morning. Disp: 30 capsule Rfl: 0   albuterol (PROVENTIL HFA,VENTOLIN HFA, PROAIR HFA) 108 (90 Base) MCG/ACT inhaler Inhale 2 puffs into the lungs every 4 (four) hours as needed for Wheezing or Shortness of breath Disp: 1 Inhaler Rfl: 1   loratadine (CLARITIN) 10 MG tablet Take 1 tablet by mouth daily Disp: 30 tablet Rfl: 11   albuterol (PROVENTIL) (2.5 MG/3ML) 0.083% nebulizer solution Take 3 mLs by nebulization every 6 (six) hours as needed for Wheezing Disp: 75 mL Rfl: 0     No current facility-administered medications for this encounter.     Home Medications    (Not in a hospital admission)    Allergies:   Review of Patient's Allergies indicates:   Erythromycin            Itching   Erythromycin            Itching   Macrolides and keto*    Itching, Rash    Smoking, Alcohol, Drugs:  Social History    Tobacco  Use      Smoking status: Never Smoker      Smokeless tobacco: Never Used    Alcohol use: No      Drug use: No       PMHx:  Past Medical History:  06/12/2004: ACUTE GASTRITIS W/O HEMORRHAGE      Comment:  H. Pylori positive (done at [facility]); GI consult Dr.               Phillis Knack, TOC 10/01/04  03/26/2004: ALLERGY, UNSPECIFIED      Comment:  Seen by ENT, deviated septum, hypertrophy of inferior                nasal turbinates  No date: Anxiety  No date: Asthma  07/26/2004: CLOSTRIDIUM DIFFICILE      Comment:  GI cosult: Dr. Rollene Rotunda, TOC done on 10/01/04  No date: Elevated cholesterol      Comment:  resolved without meds  No date: Esophageal reflux  No date: Snores  No date: Stomach disease  No  date: Unspecified asthma(493.90)  No date: Wears eyeglasses      Comment:  night driving    Vitals  Ht _0  (1.6 m)  Wt 130.6 kg (288 lb)  LMP 12/15/2017 (Exact Date)  BMI 51.02 kg/m2    Review of Systems     Patient summary reviewed      Anesthetic History:   negative anesthesia history ROS           Cardiovascular: Positive for hypercholesterolemia.   Pulmonary: Positive for asthma.   Endocrine: Positive for Diabetes.   Psychiatric: Positive for anxiety and depression.    Other:BMI greater than 40 kg/m2      Physical Exam    General     Level of consciousness:  Alert   no respiratory distress syndrome   Airway     Mallampati:  II    TM distance:  >3 FB    Mouth opening:  >3 FB    Neck ROM:  Full   Teeth     Heart      Lungs              Pertinent Labs:   Lab Results   Component Value Date    NA 137 10/28/2017    K 4.3 10/28/2017    CREAT 0.7 10/28/2017    GLUCOSER 75 10/28/2017    WBC 9.1 10/28/2017    HCT 40.1 10/28/2017    PLTA 268 10/28/2017    PT 10.0 04/28/2008    APTT 27.5 04/28/2008    INR 1.0 (L) 04/28/2008         Anesthesia Plan    ASA Score:     ASA:  3    Airway:      Mallampati:  II    Mouth opening:  >3 FB    Neck ROM:  Full    TM distance:  >3 FB    Plan: general and MAC    Other information:     EKG Reviewed:  : Yes      Full Stomach Precaution:: No      Post-Plan::  PACU    Informed Consent:     Anesthetic plan and risks discussed with:  Patient   Patient Consented        Attending Anesthesiologist Statement:     Reassessed day of surgery: Yes        Assessment made, necessary equipment and appropriate plan in place.

## 2017-12-15 NOTE — PROVATION-GI (Signed)
[facility]  Patient Name: Alejandra Martin  MRN: [id number]  CSN: [id number]  Date of Birth: 01-09-1971  Admit Type: Outpatient  Age: 47  Gender: Female  Note Status: Finalized  Multiple Proc?: Also had an EGD today.  Preparation: Go Melany [place]  Patient Location: [facility]  Referring MD:         Janann August, MD  Procedure Date:       12/15/2017 9:20:45 AM  Procedure:            Colonoscopy  Endoscopist:          Lucio Edward, MD  Indications for Procedure:       Surveillance: Personal history of adenomatous polyps on last colonoscopy 3        years ago, Abnormal liver function tests  Medications:          Monitored Anesthesia Care  Procedure:       Pre-Anesthesia Assessment:       - See the other procedure note for documentation of the pre-procedure        assessment.       Just prior to the procedure, an updated history and physical was done. I        obtained an informed consent from the patient reviewing the risk of the        procedure including (but not limited to) respiratory depression, perforation,        bleeding, discomfort, a possible need for surgery and unexpected reactions to        medications. The patient is aware that test has limitations and may not        detect significant lesions such as cancer or other potential diseases. The        patient was also informed that they might need a repeat colonoscopy earlier        than standard guidelines if there are changes in their symptoms or concerning        findings noted. A time out was performed with the entire procedure staff        present. The scope was passed under direct vision. Throughout the procedure,        the patient's blood pressure, pulse, and oxygen saturations were monitored        continuously. The Colonoscope was introduced through the anus and advanced to        7 cm into the ileum. The scope was then slowly withdrawn with confirmation of        the noted findings. The colonoscopy was somewhat difficult due to  inadequate        bowel prep. Successful completion of the procedure was aided by lavage. The        patient tolerated the procedure well. The quality of the bowel preparation        was fair. Scope withdrawal time was 15 minutes. The total duration of the        procedure was 26 minutes.  Findings:       Skin tags were found on perianal exam.       A small amount of liquid stool was found in the entire colon, making        visualization difficult. Lavage of the area was performed using a moderate        amount of sterile water, resulting in clearance with good visualization.       A 8 mm polyp was found in the transverse  colon. The polyp was        semi-pedunculated. The polyp was removed with a hot snare. Resection and        retrieval were complete. Estimated blood loss was minimal.       The terminal ileum appeared normal. This was biopsied with a cold forceps for        histology and Rule out IBD. Estimated blood loss was minimal.       The ascending colon appeared normal. This was biopsied with a cold forceps        for histology and Rule out IBD. Estimated blood loss was minimal.       A 13 mm polyp was found in the ascending colon. The polyp was        semi-pedunculated. The polyp was removed with a hot snare. Resection and        retrieval were complete. Estimated blood loss was minimal.       The retroflexed view of the distal rectum and anal verge was normal and        showed no anal or rectal abnormalities.       The exam was otherwise without abnormality.  Post Procedure Diagnosis:       - Preparation of the colon was fair.       - Perianal skin tags found on perianal exam.       - Stool in the entire examined colon.       - One 8 mm polyp in the transverse colon, removed with a hot snare. Resected        and retrieved.       - The examined portion of the ileum was normal. Biopsied.       - The ascending colon is normal. Biopsied.       - One 13 mm polyp in the ascending colon, removed with a hot snare.  Resected        and retrieved.       - The distal rectum and anal verge are normal on retroflexion view.       - The examination was otherwise normal.  Complications:        No immediate complications. Estimated blood loss: Minimal.  Estimated Blood Loss: Estimated blood loss was minimal.  Recommendation:       - Discharge patient to home (ambulatory).       - Await pathology results.       - Return to my office 6 to 8 weeks.       - No ibuprofen, naproxen, or other non-steroidal anti-inflammatory drugs for        1 week after polyp removal.       - MRCP to be set up given question of abnormal ampulla of Vater on EGD  Lucio Edward, MD  12/15/2017 11:08:35 AM  This report has been signed electronically.  Number of Addenda: 0  Note Initiated On: 12/15/2017 9:20 AM

## 2017-12-15 NOTE — H&P (Addendum)
GI Pre-procedure History and Physical Short Form  Alejandra Martin is an 47 year old female.    Chief Complaint: She presents for Both upper GI endoscopy and colonoscopy    HPI:   This 47 year old English speaking patient presents for follow up her history of GERD, Barrett's and a history of abnormal LFT's.      Reviewd the patient's last LFT's from 06/22/17 which were normal. Her most recent cholesterol was high. We discussed this.     She complains of some periumbilical cramping and pain. She has had a trace amount of hematochezia attributed to a hemorrhoid.     The patient denied heartburn, regurgitation, dysphagia, odynophagia, early satiety, weight loss, nausea, vomiting, hematemesis, melena, bloating, eructation, flatulence, diarrhea, bowel urgency, constipation, fever, anemia, or jaundice.     The patient is very anxious.    Patient Active Problem List:     Allergy     Acute gastritis without mention of hemorrhage     Intestinal infection due to Clostridium difficile     Pure hypercholesterolemia     Major depressive disorder, recurrent episode, moderate with anxious distress (HCC)     Generalized anxiety disorder     Benign positional vertigo     Contraceptive management     Tubular adenoma of colon     GERD (gastroesophageal reflux disease)     Diarrhea     Cholecystitis     Epigastric abdominal pain     Mass of ampulla of Vater     Gastroesophageal reflux disease with esophagitis     Obesity     Colon polyp     Mucosal abnormality of duodenum     Abnormal LFTs     Allergic rhinitis due to pollen     Anisometropic amblyopia of right eye     Barrett's esophagus     Disorder of biliary tract     Gastroesophageal reflux disease     Mild intermittent asthma without complication     Prediabetes     Tinea pedis of both feet     Perimenopause     Hyperprolactinemia ([place])     Morbid obesity with BMI of 50.0-59.9, adult ([place])     Hyperlipidemia     ERRONEOUS ENCOUNTER--DISREGARD     Abnormal MRI of head     Headache  disorder        Past Medical History:  06/12/2004: ACUTE GASTRITIS W/O HEMORRHAGE      Comment:  H. Pylori positive (done at [facility]); GI consult Dr.               Phillis Knack, TOC 10/01/04  03/26/2004: ALLERGY, UNSPECIFIED      Comment:  Seen by ENT, deviated septum, hypertrophy of inferior                nasal turbinates  No date: Anxiety  No date: Asthma  07/26/2004: CLOSTRIDIUM DIFFICILE      Comment:  GI cosult: Dr. Rollene Rotunda, TOC done on 10/01/04  No date: Elevated cholesterol      Comment:  resolved without meds  No date: Esophageal reflux  No date: Snores  No date: Stomach disease  No date: Unspecified asthma(493.90)  No date: Wears eyeglasses      Comment:  night driving      Past Surgical History:  No date: CHOLECYSTECTOMY  No date: NO SIGNIFICANT SURGICAL HISTORY  No date: OB ANTEPARTUM CARE CESAREAN DLVR & POSTPARTUM      Social  History     Socioeconomic History   . Marital status: Married     Spouse name: Not on file   . Number of children: Not on file   . Years of education: Not on file   . Highest education level: Not on file   Occupational History   . Not on file   Social Needs   . Financial resource strain: Not on file   . Food insecurity:     Worry: Not on file     Inability: Not on file   . Transportation needs:     Medical: Not on file     Non-medical: Not on file   Tobacco Use   . Smoking status: Never Smoker   . Smokeless tobacco: Never Used   Substance and Sexual Activity   . Alcohol use: No   . Drug use: No   . Sexual activity: Yes     Partners: Male     Comment: men age 32 q 4-6 wks x 3d, small fibroid on u/s, 1st SA age 26, 2 lifetime partners, current x 8 yrs   Lifestyle   . Physical activity:     Days per week: Not on file     Minutes per session: Not on file   . Stress: Not on file   Relationships   . Social connections:     Talks on phone: Not on file     Gets together: Not on file     Attends religious service: Not on file     Active member of club or organization: Not on file     Attends  meetings of clubs or organizations: Not on file     Relationship status: Not on file   . Intimate partner violence:     Fear of current or ex partner: Not on file     Emotionally abused: Not on file     Physically abused: Not on file     Forced sexual activity: Not on file   Other Topics Concern   . Not on file   Social History Narrative    07/09/2005    [name] family at age 33, husband from [place]    Works [place]    Pt thrilled with pregnancy-seeking pregnancy x 4 yrs, very anxious since sab 2 yrs ago    Has one child    Not working now    Lives with husband and daughter    Alton Revere, MD, 05/13/2016, 1:50 PM         Review of patient's family history indicates:  Problem: OTHER      Relation: Father          Age of Onset: (Not Specified)          Comment: leukemia   Problem: Diabetes      Relation: Mother          Age of Onset: (Not Specified)  Problem: Hypertension      Relation: Mother          Age of Onset: (Not Specified)  Problem: Lipids      Relation: Mother          Age of Onset: (Not Specified)  Problem: OTHER      Relation: Mother          Age of Onset: (Not Specified)          Comment: ESRD on HD  Problem: Hypertension      Relation: Father          Age of Onset: (Not Specified)  Problem: Hypertension      Relation: Brother          Age of Onset: (Not Specified)      Allergies:   Review of Patient's Allergies indicates:   Erythromycin            Itching   Erythromycin            Itching   Macrolides and keto*    Itching, Rash        Current Outpatient Medications on File Prior to Encounter:  omeprazole (PRILOSEC) 40 MG capsule Take 1 capsule by mouth daily Disp: 30 capsule Rfl: 2   atorvastatin (LIPITOR) 40 MG tablet Take 1 tablet by mouth daily Disp: 30 tablet Rfl: 3   DULoxetine (CYMBALTA) 30 MG capsule Take 1 each morning. Disp: 30 capsule Rfl: 0   albuterol (PROVENTIL HFA,VENTOLIN HFA, PROAIR HFA) 108 (90 Base) MCG/ACT inhaler Inhale 2  puffs into the lungs every 4 (four) hours as needed for Wheezing or Shortness of breath Disp: 1 Inhaler Rfl: 1   loratadine (CLARITIN) 10 MG tablet Take 1 tablet by mouth daily Disp: 30 tablet Rfl: 11   albuterol (PROVENTIL) (2.5 MG/3ML) 0.083% nebulizer solution Take 3 mLs by nebulization every 6 (six) hours as needed for Wheezing Disp: 75 mL Rfl: 0     No current facility-administered medications on file prior to encounter.     Review of Systems:  Cardiovascular:  No chest pain, palpitations, MI or heart failure. Has hyperlipidemia.  Pulmonary:  No pneumonia, TB or asthma.    Neuro:  No stroke, seizure or loss of consciousness.   Psych: Anxiety disorder.   Endocrine:  No diabetes or thyroid disease.       Physical Exam:  Vital Signs: Ht _0  (1.6 m)  Wt 130.6 kg (288 lb)  LMP 12/15/2017 (Exact Date)  BMI 51.02 kg/m2  ENT: No scleral icterus or adenopathy  Airway Evaluation:  Gag reflex intact: Yes  Ability to open mouth wide:   Full  Dentures:  No  Loose teeth:  No  Neck range of motion  Full  Mallampati Airway Classification: Class II   The same as Class I except the tonsilar pillars are hidden by the tongue.  CHEST: Clear to auscultation and percussion.   CARDIOVASC: Normal rate and rhythym, no murmurs, rubs or gallops  ABDOMEN: Normal bowel sounds. No tenderness, mass or enlargement of spleen or liver. No surgical scars.  EXTREMITIES : Without clubbing, cyanosis or edema.   NEUROLOGIC: Alert and oriented x3, no focal findings.  DERM: No jaundice, hives or rashes.    Results for Alejandra Martin, Alejandra Martin (MRN [id number]) as of 12/15/2017 09:32   Ref. Range 10/28/2017 13:15   SODIUM Latest Ref Range: 136 - 145 mmol/L 137   POTASSIUM Latest Ref Range: 3.5 - 5.1 mmol/L 4.3   CHLORIDE Latest Ref Range: 98 - 107 mmol/L 99   CARBON DIOXIDE Latest Ref Range: 21 - 32 mmol/L 27   ANION GAP Latest Ref Range: 5 - 15 mmol/L 11   CALCIUM Latest Ref Range: 8.5 - 10.1 mg/dL 9.7   Glucose Random Latest Ref Range: 74 - 160 mg/dL 75   BUN  (UREA NITROGEN) Latest Ref Range: 7 - 18 mg/dL 15   CREATININE Latest Ref Range: 0.4 - 1.2 mg/dL 0.7   ESTIMATED GLOMERULAR FILT RATE Latest  Ref Range: >60 ML/MIN > 60   TOTAL PROTEIN Latest Ref Range: 6.4 - 8.2 g/dL 7.6   ALBUMIN Latest Ref Range: 3.4 - 5.0 g/dL 3.9   BILIRUBIN TOTAL Latest Ref Range: 0.2 - 1.0 mg/dL 0.2   ALKALINE PHOSPHATASE Latest Ref Range: 45 - 117 U/L 97   ASPARTATE AMINOTRANSFERASE Latest Ref Range: 8 - 34 U/L 20   ALANINE AMINOTRANSFERASE Latest Ref Range: 12 - 45 U/L 28   LIPASE Latest Ref Range: 73 - 393 U/L 87   PROLACTIN Latest Ref Range: 2.2 - 30.3 ng/mL 22.0   WHITE BLOOD CELL COUNT Latest Ref Range: 4.0 - 11.0 TH/uL 9.1   RED BLOOD CELL COUNT Latest Ref Range: 3.90 - 5.20 M/uL 4.41   HEMOGLOBIN Latest Ref Range: 11.2 - 15.7 g/dL 13.0   HEMATOCRIT Latest Ref Range: 34.1 - 44.9 % 40.1   MEAN CORPUSCULAR VOL Latest Ref Range: 80.0 - 100.0 fL 90.9   MEAN CORPUSCULAR HGB Latest Ref Range: 26.0 - 34.0 pg 29.5   MEAN CORP HGB CONC Latest Ref Range: 31.0 - 37.0 g/dL 32.4   RBC DISTRIBUTION WIDTH STD DEV Latest Ref Range: 35.1 - 46.3 fL 43.8   RBC DISTRIBUTION WIDTH Latest Ref Range: 11.5 - 14.3 % 13.5   PLATELET COUNT Latest Ref Range: 150 - 400 TH/uL 268   MEAN PLATELET VOLUME Latest Ref Range: 8.7 - 12.5 fL 9.6   NEUTROPHIL % Latest Ref Range: 40.0 - 75.0 % 72.2   IMMATURE GRANULOCYTE % Latest Ref Range: 0.0 - 0.4 % 0.1   LYMPHOCYTE % Latest Ref Range: 15.0 - 54.0 % 21.5   MONOCYTE % Latest Ref Range: 4.0 - 13.0 % 5.3   EOSINOPHIL % Latest Ref Range: 0.0 - 7.0 % 0.7   BASOPHIL % Latest Ref Range: 0.0 - 1.2 % 0.2   ABSOLUTE NEUTROPHIL COUNT Latest Ref Range: 1.6 - 8.3 TH/uL 6.6   ABSOLUTE IMM GRAN COUNT Latest Ref Range: 0.00 - 0.03 TH/uL 0.01   ABSOLUTE LYMPH COUNT Latest Ref Range: 0.6 - 5.9 TH/uL 2.0   ABSOLUTE MONO COUNT Latest Ref Range: 0.2 - 1.4 TH/uL 0.5   ABSOLUTE EOSINOPHIL COUNT Latest Ref Range: 0.0 - 0.8 TH/uL 0.1   ABSOLUTE BASO COUNT Latest Ref Range: 0.0 - 0.1 TH/uL 0.0        ASA Classification: ASA Class III (a patient with significant systemic disease)    Sedation: MAC    Impression:  Generalized abdominal pain  History of colon polyps - last colonoscopy 06/07/14.  Abnormal LFTs  Generalized anxiety disorder  Barrett's esophagus with dysplasia    No contraindication to proceeding with the indicated Both upper GI endoscopy and colonoscopy    Medical Decision Making/Plan:  The patient was very worried about her liver. Reviewed our most recent findings.     She noted trace hematochezia. Her last colonoscopy was 06/07/14. Follow up was set for 5 yrs but given the bleeding we could do now. She would like to have it checked now.     Check CBC and CMP and lipase.    Proceed with Both upper GI endoscopy and colonoscopy

## 2017-12-16 ENCOUNTER — Ambulatory Visit: Payer: No Typology Code available for payment source | Attending: Psychiatry

## 2017-12-16 DIAGNOSIS — F411 Generalized anxiety disorder: Secondary | ICD-10-CM | POA: Insufficient documentation

## 2017-12-16 DIAGNOSIS — F331 Major depressive disorder, recurrent, moderate: Secondary | ICD-10-CM | POA: Insufficient documentation

## 2017-12-16 DIAGNOSIS — F4321 Adjustment disorder with depressed mood: Secondary | ICD-10-CM | POA: Diagnosis present

## 2017-12-16 DIAGNOSIS — F4320 Adjustment disorder, unspecified: Secondary | ICD-10-CM

## 2017-12-16 NOTE — Progress Notes (Signed)
OUTPATIENT PSYCHIATRY PROGRESS NOTE    INTERPRETER: No    CONTACT INFO FOR OTHER AGENCIES AND MENTAL HEALTH PROVIDERS (IF APPLICABLE): N/A    PROBLEM(S) ADDRESSED IN THIS SESSION:   - Depressed mood  - Anxiety symptoms and ruminative worries around health   -Grief due to father's death and niece's death.    SUBJECTIVE  TODAY'S CHIEF COMPLAINT AND CLINICAL UPDATES IN PATIENT'S WORDS:  1) Chief Complaint (Patient and/or guardian's own words, concerns and expressed thoughts): "I'm worried about my health".    2) New information from patient and/or collateral (Patient's illness: context, course, modifying factors, severity, cultural, family, social, medical history):   Alejandra Martin presents with symptoms of depression, anxiety, and grief in the context of feeling sad, having low interest in doing things, low self esteem and having multiple worries about having cancer, grieving father's death and niece's death.     -Pt said she is interested in seeing a nutritionist due to acid reflex and feeling pain on esophagus.     OBJECTIVE  DATA REVIEWED (Consider medical labs, radiology, other medical tests; screening/outcome measures; psychological testing; discussion of test results with other clinicians; consultation with other clinicians and systems involved with patient, summary of old records):   -Pt's colonoscopy and endoscopy results were normal.   -Pt will have a medical test to check the pancreas.    CURRENT MEDICATIONS (make clear medications prescribed by psychiatry; include OTC medications):  Current Outpatient Medications   Medication Sig    omeprazole (PRILOSEC) 40 MG capsule Take 1 capsule by mouth daily    atorvastatin (LIPITOR) 40 MG tablet Take 1 tablet by mouth daily    DULoxetine (CYMBALTA) 30 MG capsule Take 1 each morning.    albuterol (PROVENTIL HFA,VENTOLIN HFA, PROAIR HFA) 108 (90 Base) MCG/ACT inhaler Inhale 2 puffs into the lungs every 4 (four) hours as needed for Wheezing or Shortness of breath     loratadine (CLARITIN) 10 MG tablet Take 1 tablet by mouth daily    albuterol (PROVENTIL) (2.5 MG/3ML) 0.083% nebulizer solution Take 3 mLs by nebulization every 6 (six) hours as needed for Wheezing     No current facility-administered medications for this visit.        MEDICATION ADHERENCE (including barriers and how addressed): n/a  MEDICATION SIDE EFFECTS (Prescribers Only): N/A    BIRTH CONTROL (ask females and males): Deferred    CURRENT PREGNANCY: No                                    MENTAL STATUS EXAMINATION                     General Appearance: Pt appearing well groomed, dressed casually     Interaction with Interviewer (eye contact, attitude, behavior): Within normal limits    Physical Signs    Gait and Station (how patient walks and stands): Within normal limits                  Physical Appearance: Within normal limits          Normal Movements: Within normal limits         Speech (rate, volume, articulation): Within normal limits          Language: [place] first language, speaks [place] fluently  Mood: "I'm worried""           Affect: dysthymic, anxious, crying during session while remembering pt's losses.     Thought Process     Rate; concrete vs abstract reasoning: Within normal limits        Logical vs illogical; associations: loose, tangential, circumstantial, intact: Within normal limits                                    Thought Content    Normal Thought Content (other than safety): Within normal limits     Perceptions (auditory, visual, tactile, etc.): None       Impulse Control: Good         Cognition (Link to [facility])    Orientation (person, place, time): oriented x3      Recent and remote memory: Within normal limits           Attention span and concentration: Within normal limits         Fund of knowledge, awareness of current events and vocabulary: Within normal limits      Judgment: Good          Insight: Good        Suicidality/Homicidality/Aggression (Victimization  or Perpetration): None. Pt describes religion as protective factor to suicide.      ASSESSMENT   Today's Assessment:  Pt appearing dysthymic, anxious, due to several life stressors in the past and current physical symptoms and several losses in her family. No SI.HI.     Risk Level Assessment  Risk Level Change (if yes, please describe): No    Suicide: low (1)  Violence: low (1)  Addiction: low (1)        Protective Factors: Religious faith, primary supports, housing, hard working    [place] (psychiatric diagnoses and medical diagnoses that factor into management of psychiatric treatment): Major Depressive Disorder, recurrent episode, moderate with anxious distress  Generalized Anxiety Disorder  Grief reaction    CLINICAL FORMULATION (Make changes as your understanding changes. Should coincide with treatment plan): Pt is a 47yo [place] married mother of an 62yo daughter suffering from depression and anxiety symptoms. These symptoms appear to be related to health concerns, originating from the time when her daughter was born with bad acid reflux resulting in necessary hospital care of her newborn. Depressive and anxiety symptoms appear to have worsened in the context of father's multiple cancer diagnosis, and following his death approximately five years ago. Pt's mother is also now ill, and is currently in dialysis. Pt may benefit from therapy in order to process losses, and develop skills in which to manage anxiety and depression.      REVIEWING TODAY'S VISIT  CLINICAL INTERVENTIONS TODAY: Active listening, exploratory questions, validation, connecting thoughts/ feelings/ behaviors, psychoeducation about exercise/ diet/ sleep, sleep hygiene, Identifying unhelpful thought patterns exercise.    PATIENT'S RESPONSE TO INTERVENTIONS: Pt agreed with plan.     PROGRESS TOWARDS GOALS: Slow    TIME SPENT IN PSYCHOTHERAPY: 45 minutes    PLAN  PLAN FOR MANAGING RISK (Consider risk plan for patients at  moderate or high risk for suicide/violence/addiction; medication plan; referrals, etc. Must coincide with treatment plan.): Continue to monitor for changes in risk.    PLAN FOR ONGOING TREATMENT:  Weekly psychotherapy    INFORMED CONSENT (for any new treatment): Patient was informed of the potential risks and benefits of the treatment, including  the option not to treat, and appeared to understand and agreed to comply. Discussion included the following key points: Confidentiality, frame of treatment.      ONLY FOR PRESCRIBERS DOING EVALUATION AND MANAGEMENT VISITS     Use only when no psychotherapy performed  COUNSELING AND COORDINATION OF CARE PROVIDED (Consider diagnostic results/impressions and/or recommended studies; risks and benefits of treatment options; instruction for management/treatment and/or follow-up; importance of compliance with chosen treatment options; risk factor reduction; patient/family/caregiver education; prognosis): N/A      Over 50% of time during today's visit was devoted to counseling and/or coordination of care.  If yes, record estimated duration of the face to face encounter.  N/A    INSTRUCTIONS TO COVERING CLINICIANS:  N/A      Alejandra Back, LCSW

## 2017-12-22 LAB — SURGICAL PATH SPECIMEN GASTROINTESTINAL

## 2017-12-23 ENCOUNTER — Ambulatory Visit (HOSPITAL_BASED_OUTPATIENT_CLINIC_OR_DEPARTMENT_OTHER): Payer: No Typology Code available for payment source

## 2017-12-24 ENCOUNTER — Encounter (HOSPITAL_BASED_OUTPATIENT_CLINIC_OR_DEPARTMENT_OTHER): Payer: Self-pay

## 2017-12-30 ENCOUNTER — Ambulatory Visit: Payer: No Typology Code available for payment source | Attending: Psychiatry

## 2017-12-30 DIAGNOSIS — F411 Generalized anxiety disorder: Secondary | ICD-10-CM | POA: Insufficient documentation

## 2017-12-30 DIAGNOSIS — F331 Major depressive disorder, recurrent, moderate: Secondary | ICD-10-CM | POA: Diagnosis present

## 2017-12-30 NOTE — Progress Notes (Signed)
OUTPATIENT PSYCHIATRY PROGRESS NOTE    INTERPRETER: No    CONTACT INFO FOR OTHER AGENCIES AND MENTAL HEALTH PROVIDERS (IF APPLICABLE): N/A    PROBLEM(S) ADDRESSED IN THIS SESSION:   - Depressed mood  - Anxiety symptoms and ruminative worries around fear of having pancreatic cancer due to uncle passing away 7 months ago from pancreatic cancer.       SUBJECTIVE  TODAY'S CHIEF COMPLAINT AND CLINICAL UPDATES IN PATIENT'S WORDS:  1) Chief Complaint (Patient and/or guardian's own words, concerns and expressed thoughts): "I'm worried about my health".    2) New information from patient and/or collateral (Patient's illness: context, course, modifying factors, severity, cultural, family, social, medical history):   Mrs. Berte presents with symptoms of depression, anxiety in the context of feeling sad, having low interest in doing things, low self esteem, having difficulty falling asleep and staying asleep and having multiple worries about having cancer.    -Pt no showed to nutritionist appt due to not knowing about appt. Pt plans to call to reschedule. Pt continues to be interested in seeing a nutritionist due to acid reflex and feeling pain on esophagus.     OBJECTIVE  DATA REVIEWED (Consider medical labs, radiology, other medical tests; screening/outcome measures; psychological testing; discussion of test results with other clinicians; consultation with other clinicians and systems involved with patient, summary of old records):   -Pt will have a medical test to check the pancreas.    CURRENT MEDICATIONS (make clear medications prescribed by psychiatry; include OTC medications):  Current Outpatient Medications   Medication Sig    omeprazole (PRILOSEC) 40 MG capsule Take 1 capsule by mouth daily    atorvastatin (LIPITOR) 40 MG tablet Take 1 tablet by mouth daily    DULoxetine (CYMBALTA) 30 MG capsule Take 1 each morning.    albuterol (PROVENTIL HFA,VENTOLIN HFA, PROAIR HFA) 108 (90 Base) MCG/ACT inhaler Inhale 2 puffs  into the lungs every 4 (four) hours as needed for Wheezing or Shortness of breath    loratadine (CLARITIN) 10 MG tablet Take 1 tablet by mouth daily    albuterol (PROVENTIL) (2.5 MG/3ML) 0.083% nebulizer solution Take 3 mLs by nebulization every 6 (six) hours as needed for Wheezing     No current facility-administered medications for this visit.        MEDICATION ADHERENCE (including barriers and how addressed): n/a  MEDICATION SIDE EFFECTS (Prescribers Only): N/A    BIRTH CONTROL (ask females and males): Deferred    CURRENT PREGNANCY: No                                    MENTAL STATUS EXAMINATION                     General Appearance: Pt appearing well groomed, dressed casually     Interaction with Interviewer (eye contact, attitude, behavior): Within normal limits    Physical Signs    Gait and Station (how patient walks and stands): Within normal limits                  Physical Appearance: Within normal limits          Normal Movements: Within normal limits         Speech (rate, volume, articulation): Within normal limits          Language: [place] first language, speaks [place] fluently  Mood: "I'm worried""           Affect: dysthymic, anxious, crying during session while remembering pt's losses.     Thought Process     Rate; concrete vs abstract reasoning: Within normal limits        Logical vs illogical; associations: loose, tangential, circumstantial, intact: Within normal limits                                    Thought Content    Normal Thought Content (other than safety): Within normal limits     Perceptions (auditory, visual, tactile, etc.): None       Impulse Control: Good         Cognition (Link to [facility])    Orientation (person, place, time): oriented x3      Recent and remote memory: Within normal limits           Attention span and concentration: Within normal limits         Fund of knowledge, awareness of current events and vocabulary: Within normal limits      Judgment:  Good          Insight: Good        Suicidality/Homicidality/Aggression (Victimization or Perpetration): None. Pt describes religion as protective factor to suicide.      ASSESSMENT   Today's Assessment:  Pt continues to appear dysthymic, anxious, due to several life stressors and fear of having cancer due to family history of several fam members passing away from cancer.     Risk Level Assessment  Risk Level Change (if yes, please describe): No    Suicide: low (1)  Violence: low (1)  Addiction: low (1)        Protective Factors: Religious faith, primary supports, housing, hard working    [place] (psychiatric diagnoses and medical diagnoses that factor into management of psychiatric treatment): Major Depressive Disorder, recurrent episode, moderate with anxious distress  Generalized Anxiety Disorder    CLINICAL FORMULATION (Make changes as your understanding changes. Should coincide with treatment plan): Pt is a 47yo [place] married mother of an 33yo daughter suffering from depression and anxiety symptoms. These symptoms appear to be related to health concerns, originating from the time when her daughter was born with bad acid reflux resulting in necessary hospital care of her newborn. Depressive and anxiety symptoms appear to have worsened in the context of father's multiple cancer diagnosis, and following his death approximately five years ago. Pt's mother is also now ill, and is currently in dialysis. Pt may benefit from therapy in order to process losses, and develop skills in which to manage anxiety and depression.      REVIEWING TODAY'S VISIT  CLINICAL INTERVENTIONS TODAY:  Active listening  Build therapeutic alliance  CBT  Meditation    PATIENT'S RESPONSE TO INTERVENTIONS: Pt agreed with plan.     PROGRESS TOWARDS GOALS: Slow    TIME SPENT IN PSYCHOTHERAPY: 45 minutes    PLAN  PLAN FOR MANAGING RISK (Consider risk plan for patients at moderate or high risk for suicide/violence/addiction;  medication plan; referrals, etc. Must coincide with treatment plan.): Continue to monitor for changes in risk.    PLAN FOR ONGOING TREATMENT:  Weekly psychotherapy    INFORMED CONSENT (for any new treatment): Patient was informed of the potential risks and benefits of the treatment, including the option not to treat, and appeared to understand  and agreed to comply. Discussion included the following key points: Confidentiality, frame of treatment.      ONLY FOR PRESCRIBERS DOING EVALUATION AND MANAGEMENT VISITS     Use only when no psychotherapy performed  COUNSELING AND COORDINATION OF CARE PROVIDED (Consider diagnostic results/impressions and/or recommended studies; risks and benefits of treatment options; instruction for management/treatment and/or follow-up; importance of compliance with chosen treatment options; risk factor reduction; patient/family/caregiver education; prognosis): N/A      Over 50% of time during today's visit was devoted to counseling and/or coordination of care.  If yes, record estimated duration of the face to face encounter.  N/A    INSTRUCTIONS TO COVERING CLINICIANS:  N/A      Otila Back, LCSW

## 2018-01-03 ENCOUNTER — Encounter (HOSPITAL_BASED_OUTPATIENT_CLINIC_OR_DEPARTMENT_OTHER): Payer: Self-pay | Admitting: Gastroenterology

## 2018-01-04 ENCOUNTER — Encounter (HOSPITAL_BASED_OUTPATIENT_CLINIC_OR_DEPARTMENT_OTHER): Payer: Self-pay | Admitting: Gastroenterology

## 2018-01-08 ENCOUNTER — Ambulatory Visit (HOSPITAL_BASED_OUTPATIENT_CLINIC_OR_DEPARTMENT_OTHER): Payer: No Typology Code available for payment source

## 2018-01-08 ENCOUNTER — Ambulatory Visit: Payer: No Typology Code available for payment source | Attending: Psychiatry

## 2018-01-08 DIAGNOSIS — F331 Major depressive disorder, recurrent, moderate: Secondary | ICD-10-CM | POA: Insufficient documentation

## 2018-01-08 DIAGNOSIS — F411 Generalized anxiety disorder: Secondary | ICD-10-CM | POA: Diagnosis present

## 2018-01-08 NOTE — Progress Notes (Signed)
OUTPATIENT PSYCHIATRY PROGRESS NOTE    INTERPRETER: No    CONTACT INFO FOR OTHER AGENCIES AND MENTAL HEALTH PROVIDERS (IF APPLICABLE): N/A    PROBLEM(S) ADDRESSED IN THIS SESSION:   - Depressed mood  - Anxious mood  -Sleep struggles to stay asleep      SUBJECTIVE  TODAY'S CHIEF COMPLAINT AND CLINICAL UPDATES IN PATIENT'S WORDS:  1) Chief Complaint (Patient and/or guardian's own words, concerns and expressed thoughts): "I''m tired, I haven't been sleeping well".     2) New information from patient and/or collateral (Patient's illness: context, course, modifying factors, severity, cultural, family, social, medical history):   Alejandra Martin presents with symptoms of depression, anxiety in the context of feeling depressed, guilty around feeling a strong sense of responsibility over family, difficulty staying asleep where she has been sleeping 3-4 hrs during the night without taking naps during the day.       OBJECTIVE  DATA REVIEWED (Consider medical labs, radiology, other medical tests; screening/outcome measures; psychological testing; discussion of test results with other clinicians; consultation with other clinicians and systems involved with patient, summary of old records):   n/a    CURRENT MEDICATIONS (make clear medications prescribed by psychiatry; include OTC medications):  Current Outpatient Medications   Medication Sig   . omeprazole (PRILOSEC) 40 MG capsule Take 1 capsule by mouth daily   . atorvastatin (LIPITOR) 40 MG tablet Take 1 tablet by mouth daily   . DULoxetine (CYMBALTA) 30 MG capsule Take 1 each morning.   Marland Kitchen albuterol (PROVENTIL HFA,VENTOLIN HFA, PROAIR HFA) 108 (90 Base) MCG/ACT inhaler Inhale 2 puffs into the lungs every 4 (four) hours as needed for Wheezing or Shortness of breath   . loratadine (CLARITIN) 10 MG tablet Take 1 tablet by mouth daily   . albuterol (PROVENTIL) (2.5 MG/3ML) 0.083% nebulizer solution Take 3 mLs by nebulization every 6 (six) hours as needed for Wheezing     No current  facility-administered medications for this visit.        MEDICATION ADHERENCE (including barriers and how addressed): n/a  MEDICATION SIDE EFFECTS (Prescribers Only): N/A    BIRTH CONTROL (ask females and males): Deferred    CURRENT PREGNANCY: No                                    MENTAL STATUS EXAMINATION                     General Appearance: Pt appearing well groomed, dressed casually     Interaction with Interviewer (eye contact, attitude, behavior): Within normal limits    Physical Signs    Gait and Station (how patient walks and stands): Within normal limits                  Physical Appearance: Within normal limits          Normal Movements: Within normal limits         Speech (rate, volume, articulation): Within normal limits          Language: [place] first language, speaks [place] fluently                       Mood: "I'm tired"          Affect: dysthymic, anxious, crying during session.     Thought Process     Rate; concrete vs abstract reasoning: Within normal limits  Logical vs illogical; associations: loose, tangential, circumstantial, intact: Within normal limits                                    Thought Content    Normal Thought Content (other than safety): Within normal limits     Perceptions (auditory, visual, tactile, etc.): None       Impulse Control: Good         Cognition (Link to [facility])    Orientation (person, place, time): oriented x3      Recent and remote memory: Within normal limits           Attention span and concentration: Within normal limits         Fund of knowledge, awareness of current events and vocabulary: Within normal limits      Judgment: Good          Insight: Good        Suicidality/Homicidality/Aggression (Victimization or Perpetration): None. Pt describes religion as protective factor to suicide.      ASSESSMENT   Today's Assessment:  Pt continues to appear dysthymic, anxious, difficulty staying asleep due to several life stressors, feeling a strong sense of  responsibility where she identifies she learnt from father, and feeling guilty around not being able to say no when asked for a favor from family members.      Risk Level Assessment  Risk Level Change (if yes, please describe): No    Suicide: low (1)  Violence: low (1)  Addiction: low (1)        Protective Factors: Religious faith, primary supports, housing, hard working    [place] (psychiatric diagnoses and medical diagnoses that factor into management of psychiatric treatment): Major Depressive Disorder, recurrent episode, moderate with anxious distress  Generalized Anxiety Disorder    CLINICAL FORMULATION (Make changes as your understanding changes. Should coincide with treatment plan): Pt is a 47yo [place] married mother of an 58yo daughter suffering from depression and anxiety symptoms. These symptoms appear to be related to health concerns, originating from the time when her daughter was born with bad acid reflux resulting in necessary hospital care of her newborn. Depressive and anxiety symptoms appear to have worsened in the context of father's multiple cancer diagnosis, and following his death approximately five years ago. Pt's mother is also now ill, and is currently in dialysis. Pt may benefit from therapy in order to process losses, and develop skills in which to manage anxiety and depression.      REVIEWING TODAY'S VISIT  CLINICAL INTERVENTIONS TODAY:  Active listening  CBT  Meditation  Sleep hygiene    PATIENT'S RESPONSE TO INTERVENTIONS: Pt agreed with plan.     PROGRESS TOWARDS GOALS: Slow    TIME SPENT IN PSYCHOTHERAPY: 45 minutes    PLAN  PLAN FOR MANAGING RISK (Consider risk plan for patients at moderate or high risk for suicide/violence/addiction; medication plan; referrals, etc. Must coincide with treatment plan.): Continue to monitor for changes in risk.    PLAN FOR ONGOING TREATMENT:  Weekly psychotherapy    INFORMED CONSENT (for any new treatment): Patient was informed of  the potential risks and benefits of the treatment, including the option not to treat, and appeared to understand and agreed to comply. Discussion included the following key points: Confidentiality, frame of treatment.      ONLY FOR PRESCRIBERS DOING EVALUATION AND MANAGEMENT VISITS  Use only when no psychotherapy performed  COUNSELING AND COORDINATION OF CARE PROVIDED (Consider diagnostic results/impressions and/or recommended studies; risks and benefits of treatment options; instruction for management/treatment and/or follow-up; importance of compliance with chosen treatment options; risk factor reduction; patient/family/caregiver education; prognosis): N/A      Over 50% of time during today's visit was devoted to counseling and/or coordination of care.  If yes, record estimated duration of the face to face encounter.  N/A    INSTRUCTIONS TO COVERING CLINICIANS:  N/A      Otila Back, LCSW

## 2018-01-21 ENCOUNTER — Other Ambulatory Visit (HOSPITAL_BASED_OUTPATIENT_CLINIC_OR_DEPARTMENT_OTHER): Payer: Self-pay

## 2018-01-22 ENCOUNTER — Ambulatory Visit (HOSPITAL_BASED_OUTPATIENT_CLINIC_OR_DEPARTMENT_OTHER): Payer: No Typology Code available for payment source

## 2018-01-22 ENCOUNTER — Telehealth (HOSPITAL_BASED_OUTPATIENT_CLINIC_OR_DEPARTMENT_OTHER): Payer: Self-pay

## 2018-01-22 NOTE — Progress Notes (Signed)
Writer called due to pt No show today. Pt reported she called to cancelled appt due to car trouble. Pt agreed to meet next week.

## 2018-01-26 ENCOUNTER — Other Ambulatory Visit (HOSPITAL_BASED_OUTPATIENT_CLINIC_OR_DEPARTMENT_OTHER): Payer: Self-pay | Admitting: Internal Medicine

## 2018-01-26 ENCOUNTER — Other Ambulatory Visit (HOSPITAL_BASED_OUTPATIENT_CLINIC_OR_DEPARTMENT_OTHER): Payer: Self-pay | Admitting: Physician Assistant

## 2018-01-26 DIAGNOSIS — K219 Gastro-esophageal reflux disease without esophagitis: Secondary | ICD-10-CM

## 2018-01-26 DIAGNOSIS — T7840XD Allergy, unspecified, subsequent encounter: Secondary | ICD-10-CM

## 2018-01-26 DIAGNOSIS — K2270 Barrett's esophagus without dysplasia: Secondary | ICD-10-CM

## 2018-01-26 DIAGNOSIS — J301 Allergic rhinitis due to pollen: Secondary | ICD-10-CM

## 2018-01-28 ENCOUNTER — Telehealth (HOSPITAL_BASED_OUTPATIENT_CLINIC_OR_DEPARTMENT_OTHER): Payer: Self-pay

## 2018-01-28 ENCOUNTER — Ambulatory Visit (HOSPITAL_BASED_OUTPATIENT_CLINIC_OR_DEPARTMENT_OTHER): Payer: No Typology Code available for payment source

## 2018-01-28 NOTE — Progress Notes (Signed)
Otila Back, LCSW, 01/28/2018  Writer called pt due to No Show today. No answer, left v/m with number [phone] to call to reschedule if needed.

## 2018-02-03 ENCOUNTER — Encounter (HOSPITAL_BASED_OUTPATIENT_CLINIC_OR_DEPARTMENT_OTHER): Payer: No Typology Code available for payment source | Admitting: Psychiatry

## 2018-02-05 ENCOUNTER — Ambulatory Visit (HOSPITAL_BASED_OUTPATIENT_CLINIC_OR_DEPARTMENT_OTHER): Payer: No Typology Code available for payment source

## 2018-02-11 ENCOUNTER — Ambulatory Visit: Payer: No Typology Code available for payment source | Attending: Gastroenterology | Admitting: Gastroenterology

## 2018-02-11 ENCOUNTER — Ambulatory Visit (HOSPITAL_BASED_OUTPATIENT_CLINIC_OR_DEPARTMENT_OTHER): Admission: RE | Admit: 2018-02-11 | Payer: No Typology Code available for payment source | Source: Ambulatory Visit

## 2018-02-11 ENCOUNTER — Encounter (HOSPITAL_BASED_OUTPATIENT_CLINIC_OR_DEPARTMENT_OTHER): Payer: Self-pay | Admitting: Gastroenterology

## 2018-02-11 VITALS — BP 149/93 | HR 92 | Temp 98.1°F | Resp 14 | Ht 63.0 in | Wt 285.0 lb

## 2018-02-11 DIAGNOSIS — Z6841 Body Mass Index (BMI) 40.0 and over, adult: Secondary | ICD-10-CM | POA: Diagnosis present

## 2018-02-11 DIAGNOSIS — Z86010 Personal history of colonic polyps: Secondary | ICD-10-CM

## 2018-02-11 DIAGNOSIS — K838 Other specified diseases of biliary tract: Secondary | ICD-10-CM

## 2018-02-11 DIAGNOSIS — K22719 Barrett's esophagus with dysplasia, unspecified: Secondary | ICD-10-CM

## 2018-02-11 DIAGNOSIS — K219 Gastro-esophageal reflux disease without esophagitis: Secondary | ICD-10-CM | POA: Diagnosis not present

## 2018-02-11 NOTE — Progress Notes (Signed)
This 47 year old English speaking patient presents for follow up of her upper GI endoscopy and colonoscopy .    The patient was found to have...  On EGD...  Findings:       The oropharynx was normal.       LA Grade A (one or more mucosal breaks less than 5 mm, not extending between        tops of 2 mucosal folds) esophagitis with no bleeding was found 41 to 42 cm        from the incisors. This was biopsied with a cold forceps for histology and        evaluation to rule out Barrett's Esophagus. Estimated blood loss was minimal.       No gross lesions were noted in the gastric body and in the gastric antrum.        This was biopsied with a cold forceps for histology and Helicobacter pylori        testing. Estimated blood loss was minimal.       No gross lesions were noted in the third portion of the duodenum. This was        biopsied with a cold forceps for histology and evaluation of celiac disease.        Estimated blood loss was minimal. Ampulla of Vater appears altered in        position. Pancratic divisum?       The cardia and gastric fundus were normal on retroflexion.       The exam was otherwise without abnormality.    Post Procedure Diagnosis:       - Normal oropharynx.       - LA Grade A reflux esophagitis. Rule out Barrett's esophagus. Biopsied.       - No gross lesions in the stomach. Biopsied.       - No gross lesions in the third portion of the duodenum. Biopsied.       - The examination was otherwise normal.    On Colonoscopy...  Findings:       Skin tags were found on perianal exam.       A small amount of liquid stool was found in the entire colon, making        visualization difficult. Lavage of the area was performed using a moderate        amount of sterile water, resulting in clearance with good visualization.       A 8 mm polyp was found in the transverse colon. The polyp was        semi-pedunculated. The polyp was removed with a hot snare. Resection and        retrieval were complete. Estimated  blood loss was minimal.       The terminal ileum appeared normal. This was biopsied with a cold forceps for        histology and Rule out IBD. Estimated blood loss was minimal.       The ascending colon appeared normal. This was biopsied with a cold forceps        for histology and Rule out IBD. Estimated blood loss was minimal.       A 13 mm polyp was found in the ascending colon. The polyp was        semi-pedunculated. The polyp was removed with a hot snare. Resection and        retrieval were complete. Estimated blood loss was minimal.  The retroflexed view of the distal rectum and anal verge was normal and        showed no anal or rectal abnormalities.       The exam was otherwise without abnormality.    Post Procedure Diagnosis:       - Preparation of the colon was fair.       - Perianal skin tags found on perianal exam.       - Stool in the entire examined colon.       - One 8 mm polyp in the transverse colon, removed with a hot snare. Resected        and retrieved.       - The examined portion of the ileum was normal. Biopsied.       - The ascending colon is normal. Biopsied.       - One 13 mm polyp in the ascending colon, removed with a hot snare. Resected        and retrieved.       - The distal rectum and anal verge are normal on retroflexion view.       - The examination was otherwise normal.      We reviewed its significance.      She had an EGD done at [facility]) on 08/30/15. It showed...  Findings:       No gross lesions were noted in the entire esophagus.       Diffuse moderate inflammation characterized by congestion        (edema) and erythema was found in the gastric antrum.        Biopsies were taken with a cold forceps for histology.       The examined duodenum was normal. Biopsies were taken with a        cold forceps for histology.       A single small nodule with a localized distribution was        found at the base of the ampulla c/w heterotopia previously        seenafter side  viewing scope seen  Impression:        - No gross lesions in esophagus.                     - Gastritis. Biopsied.                     - Normal examined duodenum. Biopsied.                     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, benign    Patient Active Problem List:     Allergy     Acute gastritis without mention of hemorrhage     Intestinal infection due to Clostridium difficile     Pure hypercholesterolemia     Major depressive disorder, recurrent episode, moderate with anxious distress (HCC)     Generalized anxiety disorder     Benign positional vertigo     Contraceptive management     Tubular adenoma of colon     GERD (gastroesophageal reflux disease)     Diarrhea     Cholecystitis     Epigastric abdominal pain     Mass of ampulla of Vater     Gastroesophageal reflux disease with esophagitis  Obesity     Colon polyp     Mucosal abnormality of duodenum     Abnormal LFTs     Allergic rhinitis due to pollen     Anisometropic amblyopia of right eye     Barrett's esophagus     Disorder of biliary tract     Gastroesophageal reflux disease     Mild intermittent asthma without complication     Prediabetes     Tinea pedis of both feet     Perimenopause     Hyperprolactinemia (HCC)     Morbid obesity with BMI of 50.0-59.9, adult (HCC)     Hyperlipidemia     ERRONEOUS ENCOUNTER--DISREGARD     Abnormal MRI of head     Headache disorder      Past Surgical History:  No date: CHOLECYSTECTOMY  No date: NO SIGNIFICANT SURGICAL HISTORY  No date: OB ANTEPARTUM CARE CESAREAN DLVR & POSTPARTUM    Review of Patient's Allergies indicates:   Erythromycin            Itching   Erythromycin            Itching   Macrolides and keto*    Itching, Rash    Social History    Tobacco Use      Smoking status: Never Smoker      Smokeless tobacco: Never Used    Alcohol use: No    Drug use: No      Current Outpatient Medications   Medication Sig    omeprazole (PRILOSEC) 40 MG capsule Take 1 capsule by  mouth daily    atorvastatin (LIPITOR) 40 MG tablet Take 1 tablet by mouth daily    DULoxetine (CYMBALTA) 30 MG capsule Take 1 each morning.    albuterol (PROVENTIL HFA,VENTOLIN HFA, PROAIR HFA) 108 (90 Base) MCG/ACT inhaler Inhale 2 puffs into the lungs every 4 (four) hours as needed for Wheezing or Shortness of breath    loratadine (CLARITIN) 10 MG tablet Take 1 tablet by mouth daily    albuterol (PROVENTIL) (2.5 MG/3ML) 0.083% nebulizer solution Take 3 mLs by nebulization every 6 (six) hours as needed for Wheezing     No current facility-administered medications for this visit.        Review of patient's family history indicates:  Problem: OTHER      Relation: Father          Age of Onset: (Not Specified)          Comment: leukemia   Problem: Diabetes      Relation: Mother          Age of Onset: (Not Specified)  Problem: Hypertension      Relation: Mother          Age of Onset: (Not Specified)  Problem: Lipids      Relation: Mother          Age of Onset: (Not Specified)  Problem: OTHER      Relation: Mother          Age of Onset: (Not Specified)          Comment: ESRD on HD  Problem: Hypertension      Relation: Father          Age of Onset: (Not Specified)  Problem: Hypertension      Relation: Brother          Age of Onset: (Not Specified)  No family history of colon cancer, IBD.    REVIEW OF  SYSTEMS:    Cardiovascular:  No chest pain, palpitations, MI or heart failure.  Pulmonary:  No pneumonia, TB or asthma.    Neuro:  No stroke, seizure or loss of consciousness.    Endocrine:  No diabetes or thyroid disease.      Physical Exam:  Vital Signs: BP (!) 149/93  Pulse 92  Temp 98.1 F (36.7 C)  Resp 14  Ht 5\' 3"  (1.6 m)  Wt 129.3 kg (285 lb)  SpO2 98%  BMI 50.49 kg/m2 Body mass index is 50.49 kg/m.  General: Morbidly obese body habitus.   Pulmonary: Clear to auscultation and percussion. No rales, wheezes or rhonchi.  Cardiovascular: S1, S2, no S3, S4, clicks, rubs or murmurs.  JVD not elevated with patient  sitting.  Abdominal: Normal bowel sounds. No tenderness on light or deep palpation. No hepatomegaly or splenomegaly.  Neuro: Alert and oriented x 3. Nonfocal    Labs  Path:  Results for TASHANNA, DOLIN (MRN [id number]) as of 02/11/2018 13:48   Ref. Range 12/15/2017 11:19   SODIUM Latest Ref Range: 136 - 145 mmol/L 146 (H)   POTASSIUM Latest Ref Range: 3.5 - 5.1 mmol/L 4.4   CHLORIDE Latest Ref Range: 98 - 107 mmol/L 101   CARBON DIOXIDE Latest Ref Range: 21 - 32 mmol/L 30   ANION GAP Latest Ref Range: 5 - 15 mmol/L 15   CALCIUM Latest Ref Range: 8.5 - 10.1 mg/dL 8.8   Glucose Random Latest Ref Range: 74 - 160 mg/dL 100   BUN (UREA NITROGEN) Latest Ref Range: 7 - 18 mg/dL 12   CREATININE Latest Ref Range: 0.4 - 1.2 mg/dL 0.8   ESTIMATED GLOMERULAR FILT RATE Latest Ref Range: >60 ML/MIN > 60   TOTAL PROTEIN Latest Ref Range: 6.4 - 8.2 g/dL 7.5   ALBUMIN Latest Ref Range: 3.4 - 5.0 g/dL 3.6   BILIRUBIN TOTAL Latest Ref Range: 0.2 - 1.0 mg/dL 0.4   ALKALINE PHOSPHATASE Latest Ref Range: 45 - 117 U/L 107   ASPARTATE AMINOTRANSFERASE Latest Ref Range: 8 - 34 U/L 27   ALANINE AMINOTRANSFERASE Latest Ref Range: 12 - 45 U/L 33   LIPASE Latest Ref Range: 73 - 393 U/L 73         Results for MARIYAH, UPSHAW (MRN [id number]) as of 02/11/2018 13:48   Ref. Range 12/15/2017 11:19   SODIUM Latest Ref Range: 136 - 145 mmol/L 146 (H)   POTASSIUM Latest Ref Range: 3.5 - 5.1 mmol/L 4.4   CHLORIDE Latest Ref Range: 98 - 107 mmol/L 101   CARBON DIOXIDE Latest Ref Range: 21 - 32 mmol/L 30   ANION GAP Latest Ref Range: 5 - 15 mmol/L 15   CALCIUM Latest Ref Range: 8.5 - 10.1 mg/dL 8.8   Glucose Random Latest Ref Range: 74 - 160 mg/dL 100   BUN (UREA NITROGEN) Latest Ref Range: 7 - 18 mg/dL 12   CREATININE Latest Ref Range: 0.4 - 1.2 mg/dL 0.8   ESTIMATED GLOMERULAR FILT RATE Latest Ref Range: >60 ML/MIN > 60   TOTAL PROTEIN Latest Ref Range: 6.4 - 8.2 g/dL 7.5   ALBUMIN Latest Ref Range: 3.4 - 5.0 g/dL 3.6   BILIRUBIN TOTAL Latest Ref Range:  0.2 - 1.0 mg/dL 0.4   ALKALINE PHOSPHATASE Latest Ref Range: 45 - 117 U/L 107   ASPARTATE AMINOTRANSFERASE Latest Ref Range: 8 - 34 U/L 27   ALANINE AMINOTRANSFERASE Latest Ref Range: 12 - 45 U/L 33   LIPASE Latest Ref Range: 73 -  393 U/L 73     Impression:  No diagnosis found.    Medical Decision Making:  The patient should have a follow up Colonoscopy and EGD in 3 yrs. We also discussed the fact that she should come in sooner should she have any problems with her bowels. She knows that the Both upper GI endoscopy and colonoscopy done was not perfect and that we could have missed a lesion. She also knows that it is her responsibility to set up the follow up appointment.    The patient will have an MRI today to rule out pancreatic divisum. She was noted to have a nodule at the ampulla in 2017. Although I did not see this, I was concerned about a change in the position of the ampulla (pancreatic divisum?).    Over 25 minutes spent with the patient, more than half of which were spent in counseling.

## 2018-02-12 ENCOUNTER — Encounter (HOSPITAL_BASED_OUTPATIENT_CLINIC_OR_DEPARTMENT_OTHER): Payer: Self-pay

## 2018-02-12 ENCOUNTER — Telehealth (HOSPITAL_BASED_OUTPATIENT_CLINIC_OR_DEPARTMENT_OTHER): Payer: Self-pay

## 2018-02-12 NOTE — Progress Notes (Signed)
Lyman, LCSW, 02/12/2018  Writer called pt due to No Show today. No answer, left v/m with number [phone] to call to reschedule if needed.

## 2018-02-26 ENCOUNTER — Other Ambulatory Visit (HOSPITAL_BASED_OUTPATIENT_CLINIC_OR_DEPARTMENT_OTHER): Payer: Self-pay

## 2018-03-01 NOTE — Progress Notes (Signed)
The patient is a 48 year old female with history of microprolactinoma here for follow up. She was last seen in 10/2017.     HPI: We initially learned of this patient through an e-consult. She presented with oligomenorrhea for 1 year. Prior to then, she had monthly menses for the most part. 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 Her TSH, testosterone, FSH, and estradiol levels were normal. 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 There is no mention of optic nerve involvement, but there is deviation of the stalk to the left.   Denies galactorrhea or breast tenderness. There were no gross neurological deficits on exam.    Stress: Father passed 4 years ago. Mother went on dialysis 2 years ago. Pt has significant anxiety at baseline too.  Marijuana: none  ETOH: none  Chest tattoos: none  Chest pain: none  Headaches: occasional left temporal. Resolves with tylenol.   Menarche: age 36  [id number]  Her daughter is age 31 years. She conceived after 10 years of trying. She was never told her prolactin was elevated in the past.     I started cabergoline 0.25 mg twice a week (Mondays and Thursdays) and her prolactin decreased into the normal range. In addition, full pituitary hormone evaluation was completed and negative for other pituitary hormone excess or deficiency. Periods have resumed and are monthly. Cramping is much less and overall back to baseline.     Was seen in ED and also by her PCP for depression and anxiety. Now on treatment. Mood has improved. Repeat prolactin level in May 2019 was low end of normal. I decreased the cabergoline to 0.25 mg weekly. She repeated her 1 year pituitary MRI and this showed resolution of the pituitary adenoma, however a clivus tumor was noted. We referred her to [facility] and she was seen by Dr. 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 Her cabergoline was stopped. Repeat prolactin level returned normal off treatment. Since stopping the carbergoline, she has had only 2-3 periods in the past year. Also reports hot flashes. [place] level was in menopausal range.     Interim history: she repeated her prolactin level and is here to discuss the result.      Today, she reports feeling well. Last Saturday, had some spotting only. No breast tenderness, no galactorrhea. No headaches. No changes in vision. Has occasional light-headedness with change in position. She has her f/u pituitary MRI scheduled at [facility] next month and will see Dr. Joycelyn Rua the same day.         Problem History:  Patient Active Problem List:     Allergy     Acute gastritis without mention of hemorrhage     Intestinal infection due to Clostridium difficile     Pure hypercholesterolemia     Major depressive disorder, recurrent episode, moderate with anxious distress (HCC)     Generalized anxiety disorder     Benign positional vertigo     Contraceptive management     Tubular adenoma of colon     GERD (gastroesophageal reflux disease)     Diarrhea     Cholecystitis     Epigastric abdominal pain     Mass of ampulla of Vater     Gastroesophageal reflux disease with esophagitis     Obesity     Colon polyp  Mucosal abnormality of duodenum     Abnormal LFTs     Allergic rhinitis due to pollen     Anisometropic amblyopia of right eye     Barrett's esophagus     Disorder of biliary tract     Gastroesophageal reflux disease     Mild intermittent asthma without complication     Prediabetes     Tinea pedis of both feet     Perimenopause     Hyperprolactinemia (HCC)     Morbid obesity with BMI of 50.0-59.9, adult (HCC)     Hyperlipidemia     ERRONEOUS ENCOUNTER--DISREGARD     Abnormal MRI of head     Headache disorder      Past Medical History:   Past Medical History:  06/12/2004: ACUTE GASTRITIS W/O HEMORRHAGE      Comment:  H. Pylori positive (done at [facility]); GI consult Dr.                Phillis Knack, TOC 10/01/04  03/26/2004: ALLERGY, UNSPECIFIED      Comment:  Seen by ENT, deviated septum, hypertrophy of inferior                nasal turbinates  No date: Anxiety  No date: Asthma  07/26/2004: CLOSTRIDIUM DIFFICILE      Comment:  GI cosult: Dr. Rollene Rotunda, TOC done on 10/01/04  No date: Elevated cholesterol      Comment:  resolved without meds  No date: Esophageal reflux  No date: Snores  No date: Stomach disease  No date: Unspecified asthma(493.90)  No date: Wears eyeglasses      Comment:  night driving    Active Medication:   Reviewed and updated.     Allergies:   Review of Patient's Allergies indicates:   Erythromycin            Itching   Erythromycin            Itching   Macrolides and keto*    Itching, Rash    Social history:  Social History     Socioeconomic History    Marital status: Married     Spouse name: Not on file    Number of children: Not on file    Years of education: Not on file    Highest education level: Not on file   Occupational History    Not on file   Social Needs    Financial resource strain: Not on file    Food insecurity:     Worry: Not on file     Inability: Not on file    Transportation needs:     Medical: Not on file     Non-medical: Not on file   Tobacco Use    Smoking status: Never Smoker    Smokeless tobacco: Never Used   Substance and Sexual Activity    Alcohol use: No    Drug use: No    Sexual activity: Yes     Partners: Male     Comment: men age 65 q 4-6 wks x 3d, small fibroid on u/s, 1st SA age 83, 2 lifetime partners, current x 8 yrs   Lifestyle    Physical activity:     Days per week: Not on file     Minutes per session: Not on file    Stress: Not on file   Relationships    Social connections:     Talks on phone: Not on file  Gets together: Not on file     Attends religious service: Not on file     Active member of club or organization: Not on file     Attends meetings of clubs or organizations: Not on file     Relationship status: Not on file     Intimate partner violence:     Fear of current or ex partner: Not on file     Emotionally abused: Not on file     Physically abused: Not on file     Forced sexual activity: Not on file   Other Topics Concern    Not on file   Social History Narrative    07/09/2005    [name] family [place] age 64, husband from [place]    Works FT - [employer]    Pt thrilled with pregnancy-seeking pregnancy x 4 yrs, very anxious since sab 2 yrs ago    Has one child    Not working now    Lives with husband and daughter    Alton Revere, MD, 05/13/2016, 1:50 PM   Accompanied by her sister. Unchanged.    Family History:   Review of patient's family history indicates:  Problem: OTHER      Relation: Father          Age of Onset: (Not Specified)          Comment: leukemia   Problem: Diabetes      Relation: Mother          Age of Onset: (Not Specified)  Problem: Hypertension      Relation: Mother          Age of Onset: (Not Specified)  Problem: Lipids      Relation: Mother          Age of Onset: (Not Specified)  Problem: OTHER      Relation: Mother          Age of Onset: (Not Specified)          Comment: ESRD on HD  Problem: Hypertension      Relation: Father          Age of Onset: (Not Specified)  Problem: Hypertension      Relation: Brother          Age of Onset: (Not Specified)  No history of pituitary disease. Unchanged.    Review of systems:  Rest of review of systems are negative except as stated in HPI.     Physical Exam:   BP 124/78  Pulse 109  Wt 134.3 kg (296 lb)  SpO2 97%  BMI 52.43 kg/m2  GEN: Appears well. Not that anxious today  HEENT: EOMI, no proptosis, lid lag, periorbital edema.   NECK: supple, no thyromegaly or lymphadenopathy.   ABD: nontender, obese.  EXT: no edema.   NEURO: no tremor, DTR 1+   PSYCH: normal affect.       Laboratory:  Component      Latest Ref Rng & Units 02/09/2017   PROLACTIN      2.2 - 30.3 ng/mL 4.3     Component      Latest Ref Rng & Units 11/11/2016    ADRENOCORTICOTROPIC HORMONE      7.2 - 63.3 pg/mL 60.8   CORTISOL AM      4.30 - 22.40 ug/dL 13.68   FREE THYROXINE      0.76 - 1.46 ng/dL 0.78   SOMATOMEDIN C (  IGF-1)      57 - 195 ng/mL 118     Component      Latest Ref Rng & Units 08/18/2016   THYROID SCREEN TSH REFLEX FT4      0.358 - 3.740 uIU/mL 2.410   PROLACTIN      2.2 - 30.3 ng/mL 846.9 (H)   FOLLICLE STIMULATING HORMONE      mIU/mL 8.0   LUTEINIZING HORMONE (LH)      mIU/ml    DHEA SULFATE      45 - 270 ug/dL    TSH (THYROID STIM HORMONE)      0.34 - 5.60 uIU/ml    ESTRADIOL      . pg/mL 65.2     Component      Latest Ref Rng & Units 01/22/2004   THYROID SCREEN TSH REFLEX FT4      0.358 - 3.740 uIU/mL 2.15   PROLACTIN      2.2 - 30.3 ng/mL 62.95   FOLLICLE STIMULATING HORMONE      mIU/mL 4.28   LUTEINIZING HORMONE (LH)      mIU/ml 4.25   DHEA SULFATE      45 - 270 ug/dL 140     Component      Latest Ref Rng & Units 08/27/2017 10/28/2017   PROLACTIN      2.2 - 30.3 ng/mL 0.8 (L) 22.0     Component      Latest Ref Rng & Units 03/02/2018   PROLACTIN      2.2 - 30.3 ng/mL 39.4 (H)       Assessment/plan: The patient is a 48 year old female with history of clivus lesion causing pituitary stalk deviation, hyperprolactinemia, and oligomenorrhea here for follow up.     After almost 1 year of treatment with cabergoline, periods resumed and were regular. However, later Summer 2019, she started having hot flashes and irregular periods. [place] level was upper normal range suggestive of perimenopause. This is in the setting of low prolactin. She subsequently stopped the carbergoline per Dr. Imogene Burn recommendation. Periods still irregular and rare. We reviewed repeat prolactin is mildly elevated, typical of pituitary stalk deviation. She is asymptomatic and I don't feel that this is contributing to the irregular periods. We will keep an eye on this and I will repeat her prolactin level as well as other pituitary labs next month fasting and in AM. Labs ordered.     We  discussed that if prolactin remains in 30-130 range or so, then treatment is not needed if she is asymptomatic since she is in perimenopause.     Regarding the clivus lesion, Dr. Joycelyn Rua provided reassurance that most of the time these are benign lesions. She will have f/u pituitary MRI next month with appointment with Dr. Joycelyn Rua as well.     The patient understands and agrees with the plan. All questions were answered.       Follow up in 6 months.     Richardson Landry, MD    This visit lasted 20 minutes with greater than 50% of the visit spent on counseling and coordination of care.

## 2018-03-02 ENCOUNTER — Ambulatory Visit: Payer: No Typology Code available for payment source | Attending: Internal Medicine

## 2018-03-02 DIAGNOSIS — E221 Hyperprolactinemia: Secondary | ICD-10-CM | POA: Diagnosis present

## 2018-03-02 LAB — PROLACTIN: PROLACTIN: 39.4 ng/mL — ABNORMAL HIGH (ref 2.2–30.3)

## 2018-03-02 NOTE — Progress Notes (Signed)
Labs collected:  [place], [place], 03/02/2018

## 2018-03-04 ENCOUNTER — Encounter (HOSPITAL_BASED_OUTPATIENT_CLINIC_OR_DEPARTMENT_OTHER): Payer: Self-pay | Admitting: "Endocrinology

## 2018-03-04 ENCOUNTER — Ambulatory Visit: Payer: No Typology Code available for payment source | Attending: "Endocrinology | Admitting: "Endocrinology

## 2018-03-04 VITALS — BP 124/78 | HR 109 | Wt 296.0 lb

## 2018-03-04 DIAGNOSIS — E237 Disorder of pituitary gland, unspecified: Secondary | ICD-10-CM

## 2018-03-04 NOTE — Progress Notes (Signed)
Here for follow up

## 2018-03-05 ENCOUNTER — Other Ambulatory Visit (HOSPITAL_BASED_OUTPATIENT_CLINIC_OR_DEPARTMENT_OTHER): Payer: Self-pay | Admitting: Gastroenterology

## 2018-03-05 ENCOUNTER — Ambulatory Visit
Admission: RE | Admit: 2018-03-05 | Discharge: 2018-03-05 | Disposition: A | Discharge status: Home / Self Care | Payer: No Typology Code available for payment source | Attending: Gastroenterology | Admitting: Gastroenterology

## 2018-03-05 ENCOUNTER — Ambulatory Visit (HOSPITAL_BASED_OUTPATIENT_CLINIC_OR_DEPARTMENT_OTHER): Admission: RE | Admit: 2018-03-05 | Payer: No Typology Code available for payment source | Source: Ambulatory Visit

## 2018-03-05 ENCOUNTER — Encounter (HOSPITAL_BASED_OUTPATIENT_CLINIC_OR_DEPARTMENT_OTHER): Payer: Self-pay | Admitting: Gastroenterology

## 2018-03-05 DIAGNOSIS — K839 Disease of biliary tract, unspecified: Secondary | ICD-10-CM

## 2018-03-05 DIAGNOSIS — K838 Other specified diseases of biliary tract: Secondary | ICD-10-CM

## 2018-03-05 DIAGNOSIS — R933 Abnormal findings on diagnostic imaging of other parts of digestive tract: Secondary | ICD-10-CM | POA: Diagnosis not present

## 2018-03-05 DIAGNOSIS — R1013 Epigastric pain: Secondary | ICD-10-CM

## 2018-03-17 ENCOUNTER — Encounter (HOSPITAL_BASED_OUTPATIENT_CLINIC_OR_DEPARTMENT_OTHER): Payer: Self-pay

## 2018-03-17 NOTE — Progress Notes (Signed)
PSYCHIATRY TERMINATION AND TRANSFER NOTE    Termination Document    Date treatment started: 12/02/2017    Transfer/termination date: 03/17/2018    Reason for treatment: Pt is a 48yo [place] married mother of an 58yo daughter suffering from depression and anxiety symptoms. These symptoms appear to be related to health concerns, originating from the time when her daughter was born with bad acid reflux resulting in necessary hospital care of her newborn. Depressive and anxiety symptoms appear to have worsened in the context of father's multiple cancer diagnosis, and following his death approximately five years ago. Pt's mother is also now ill, and is currently in dialysis. Pt may benefit from therapy in order to process losses, and develop skills in which to manage anxiety and depression.    Treatment course (response to medications, compliance): Pt attended 4 sessions with TW and No Showed to continued 3 sessions.     Outstanding Issues: Depression and anxiety.     Safe to refill: N/A    Risk level: Low    Plan: Pt is welcomed to be referred at a later time if she is interested in restarting psychotherapy.     For transfers, the treatment plan will now be the responsibility of: not applicable.    (Clinician, please route/cc this encounter to your team/program administrative coordinator so that the treatment plan database can be updated.)

## 2018-03-31 ENCOUNTER — Ambulatory Visit: Payer: No Typology Code available for payment source | Attending: Internal Medicine

## 2018-03-31 DIAGNOSIS — E237 Disorder of pituitary gland, unspecified: Secondary | ICD-10-CM | POA: Diagnosis present

## 2018-03-31 LAB — CORTISOL AM: CORTISOL AM: 7.93 ug/dL (ref 4.30–22.40)

## 2018-03-31 LAB — TSH (THYROID STIMULATING HORMONE): TSH (THYROID STIM HORMONE): 2.55 u[IU]/mL (ref 0.358–3.740)

## 2018-03-31 LAB — PROLACTIN: PROLACTIN: 51.6 ng/mL — ABNORMAL HIGH (ref 2.2–30.3)

## 2018-03-31 LAB — FOLLICLE STIMULATING HORMONE: FOLLICLE STIMULATING HORMONE: 1.6 m[IU]/mL

## 2018-03-31 LAB — FREE THYROXINE: FREE THYROXINE: 0.79 ng/dL (ref 0.76–1.46)

## 2018-03-31 NOTE — Progress Notes (Signed)
2sst 1 lav drawn  [place], [place], 03/31/2018

## 2018-04-01 ENCOUNTER — Telehealth (HOSPITAL_BASED_OUTPATIENT_CLINIC_OR_DEPARTMENT_OTHER): Payer: Self-pay | Admitting: "Endocrinology

## 2018-04-01 NOTE — Progress Notes (Signed)
Called pt and gave her the lab results which looked good. Prolactin only mildly elevated (non-fasting) and not significantly different compared to last one. She has upcoming MRI later this month and f/u with neurosurgery at [facility]. I will review her office note after that visit and call the patient if there is any change in plan. Since her estrogen level is normal (and she had a normal period this month), I do not feel that treatment is necessary right now. She agrees with  Plan.

## 2018-04-02 LAB — ADRENOCORTICOTROPIC HORMONE: ADRENOCORTICOTROPIC HORMONE: 45.4 pg/mL (ref 7.2–63.3)

## 2018-04-02 LAB — ESTRADIOL: ESTRADIOL: 158.8 pg/mL

## 2018-04-02 LAB — SOMATOMEDIN C (IGF-1): SOMATOMEDIN C (IGF-1): 72 ng/mL (ref 57–195)

## 2018-04-23 ENCOUNTER — Encounter (HOSPITAL_BASED_OUTPATIENT_CLINIC_OR_DEPARTMENT_OTHER): Payer: Self-pay

## 2018-05-19 ENCOUNTER — Encounter (HOSPITAL_BASED_OUTPATIENT_CLINIC_OR_DEPARTMENT_OTHER): Payer: Self-pay | Admitting: Gastroenterology

## 2018-05-19 NOTE — Progress Notes (Signed)
The patient will be contacted and informed that their appointment will be rescheduled after 10/2018 due to the Roberts virus epidemic.

## 2018-05-31 ENCOUNTER — Ambulatory Visit (HOSPITAL_BASED_OUTPATIENT_CLINIC_OR_DEPARTMENT_OTHER): Payer: Self-pay | Admitting: Gastroenterology

## 2018-06-29 ENCOUNTER — Telehealth (HOSPITAL_BASED_OUTPATIENT_CLINIC_OR_DEPARTMENT_OTHER): Payer: Self-pay

## 2018-06-29 NOTE — Progress Notes (Signed)
Unable to reach Patient  Alejandra Martin A. Jerilee Hoh, 06/29/2018           Unable to reach  [name], 06/30/2018

## 2018-07-01 NOTE — Progress Notes (Signed)
3rd attempt   Unable to reach pt  Alejandra Martin, [place], 07/01/2018

## 2018-07-06 ENCOUNTER — Telehealth (HOSPITAL_BASED_OUTPATIENT_CLINIC_OR_DEPARTMENT_OTHER): Payer: Self-pay

## 2018-07-06 NOTE — Progress Notes (Signed)
Second week  1st attempt to reach pt. Pt did not answer call.    Cherly Hensen, 07/06/2018

## 2018-07-07 NOTE — Progress Notes (Signed)
Second Week   Unable to reach  [place] A. Jerilee Hoh, 07/07/2018

## 2018-07-08 NOTE — Progress Notes (Signed)
3rd attempt   Unable to reach patient   Ardine Bjork, [place], 07/08/2018  .

## 2018-07-13 ENCOUNTER — Telehealth (HOSPITAL_BASED_OUTPATIENT_CLINIC_OR_DEPARTMENT_OTHER): Payer: Self-pay

## 2018-07-13 NOTE — Progress Notes (Signed)
3rd week.  1st attempt, unable to reach pt.    Cherly Hensen, 07/13/2018

## 2018-07-14 ENCOUNTER — Telehealth (HOSPITAL_BASED_OUTPATIENT_CLINIC_OR_DEPARTMENT_OTHER): Payer: Self-pay

## 2018-07-14 NOTE — Progress Notes (Signed)
2nd attempt  covid education   Unable to reach   [place], [place], 07/14/2018

## 2018-07-14 NOTE — Progress Notes (Signed)
Called Alejandra Martin to relay the following message:     I am calling to see how you are doing during this very difficult time.  Your PCP wanted me to call you because of your medical condition, since it means you have a higher chance of getting very sick if you get COVID.    We are advising our patients, especially those who are higher risk like yourself, to follow precautions to stay safe.  The virus is passed from person through person in droplets in the air, and stays on some surfaces for a while.   We want you to know that we are still here. Please do call us with any health questions, refills or other concerns. While we are limiting the number of in person visits to reduce the chance of spreading the virus, we do have televisits with providers.     Id like to give you some information now, it may be helpful to have a pen and paper handy.  I would advise that you:  -Stay home. Limit how much you go out as much as possible.  -limit who comes to visit you as much as possible.  -Wash your hands frequently, and dont touch your face. You can use an alcohol based sanitizer, or wash your hands with soap and water for 20 seconds.  -Sneeze or cough into a kleenex, then throw it away and wash your hands.  -If other people live in your house, have them limit how much they go out as much as possible.   -If someone has to go out, they should wash their hands when they come back into the house.    -While outside they should stay at least 6 feet away from others.  -Commonly touched surfaces in the house should be cleaned at least once a day with approved cleaners: handles, table tops, door knobs, surfaces     If you start feeling sick- with cough, fever, or trouble breathing, or sore throat, runny nose, or you suddenly cant smell anymore - call us immediately. We will evaluate you over the phone, and if needed, have you tested, and possibly evaluated in person.     If you have severe symptoms at any point, especially  severe trouble breathing, call 911. Let the operator know that you might have [name].     In addition to any concerns about COVID, I want you to know that we are still here to help take care of you. Whether you need refills for your medication, or other health concerns, or any changes in your health, please call us.     Do you need any refills today? [no  Do you have any non-COVID related questions or concerns? [no     Id like to make sure you have the phone number to our clinic.  [name] write this phone number down in case you need anything: [phone]     Please stay safe and healthy.

## 2018-09-06 ENCOUNTER — Ambulatory Visit: Payer: No Typology Code available for payment source | Attending: Physician Assistant | Admitting: "Endocrinology

## 2018-09-06 DIAGNOSIS — E221 Hyperprolactinemia: Secondary | ICD-10-CM

## 2018-09-06 NOTE — Progress Notes (Signed)
The patient is a 48 year old female with history of microprolactinoma here for follow up. She was last seen in 02/2018.     HPI: We initially learned of this patient through an e-consult. She presented with oligomenorrhea for 1 year. Prior to then, she had monthly menses for the most part. 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 Her TSH, testosterone, FSH, and estradiol levels were normal. 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 There was no mention of optic nerve involvement, but there was deviation of the stalk to the left.   Denies galactorrhea or breast tenderness. There were no gross neurological deficits on exam.    Stress: Father passed 4 years ago. Mother went on dialysis 2 years ago. Pt has significant anxiety at baseline too.  Marijuana: none  ETOH: none  Chest tattoos: none  Chest pain: none  Headaches: occasional left temporal. Resolves with tylenol.   Menarche: age 16  [id number]  Her daughter is age 73 years. She conceived after 10 years of trying. She was never told her prolactin was elevated in the past.     I started cabergoline 0.25 mg twice a week (Mondays and Thursdays) and her prolactin decreased into the normal range. In addition, full pituitary hormone evaluation was completed and negative for other pituitary hormone excess or deficiency. Periods resumed and were monthly.     Repeat prolactin level in May 2019 was low end of normal. I decreased the cabergoline to 0.25 mg weekly. She repeated her 1 year pituitary MRI and this showed resolution of the pituitary adenoma, however a clivus tumor was noted. We referred her to [facility] and she was seen by Dr. 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 Her cabergoline was stopped. Repeat prolactin level returned normal/slightly high off treatment. Since stopping the carbergoline, she had only  2-3 periods in the past year. Also reports hot flashes. [place] level was in menopausal range.     Interim history: She had a f/u appt with Dr. Vanessa Kick in 03/2018 and plan is to repeat her MRI in 1 year, stay off cabergoline, and continue monitoring her prolactin level.     Today, she reports feeling well overall. In the past few months, had regular period then monthly spotting. Has some cramping and is not sure if this is related to the significant stress she has been on. Her hot-flashes are rare. Her older cousin at 65 was just recently diagnosed with brain cancer.       Problem History:  Patient Active Problem List:     Allergy     Acute gastritis without mention of hemorrhage     Intestinal infection due to Clostridium difficile     Pure hypercholesterolemia     Major depressive disorder, recurrent episode, moderate with anxious distress (HCC)     Generalized anxiety disorder     Benign positional vertigo     Contraceptive management     Tubular adenoma of colon     GERD (gastroesophageal reflux disease)     Diarrhea     Cholecystitis     Epigastric abdominal pain     Mass of ampulla of Vater     Gastroesophageal reflux disease with esophagitis     Obesity     Colon polyp     Mucosal abnormality of duodenum  Abnormal LFTs     Allergic rhinitis due to pollen     Anisometropic amblyopia of right eye     Barrett's esophagus     Disorder of biliary tract     Gastroesophageal reflux disease     Mild intermittent asthma without complication     Prediabetes     Tinea pedis of both feet     Perimenopause     Hyperprolactinemia (HCC)     Morbid obesity with BMI of 50.0-59.9, adult (HCC)     Hyperlipidemia     Abnormal MRI of head     Headache disorder      Past Medical History:   Past Medical History:  06/12/2004: ACUTE GASTRITIS W/O HEMORRHAGE      Comment:  H. Pylori positive (done at [facility]); GI consult Dr.               Phillis Knack, TOC 10/01/04  03/26/2004: ALLERGY, UNSPECIFIED      Comment:  Seen by ENT, deviated  septum, hypertrophy of inferior                nasal turbinates  No date: Anxiety  No date: Asthma  07/26/2004: CLOSTRIDIUM DIFFICILE      Comment:  GI cosult: Dr. Rollene Rotunda, TOC done on 10/01/04  No date: Elevated cholesterol      Comment:  resolved without meds  No date: Esophageal reflux  No date: Snores  No date: Stomach disease  No date: Unspecified asthma(493.90)  No date: Wears eyeglasses      Comment:  night driving    Active Medication:   Reviewed and updated.     Allergies:   Review of Patient's Allergies indicates:   Erythromycin            Itching   Erythromycin            Itching   Macrolides and keto*    Itching, Rash    Social history:  Social History     Socioeconomic History    Marital status: Married     Spouse name: Not on file    Number of children: Not on file    Years of education: Not on file    Highest education level: Not on file   Occupational History    Not on file   Social Needs    Financial resource strain: Not on file    Food insecurity:     Worry: Not on file     Inability: Not on file    Transportation needs:     Medical: Not on file     Non-medical: Not on file   Tobacco Use    Smoking status: Never Smoker    Smokeless tobacco: Never Used   Substance and Sexual Activity    Alcohol use: No    Drug use: No    Sexual activity: Yes     Partners: Male     Comment: men age 35 q 4-6 wks x 3d, small fibroid on u/s, 1st SA age 89, 2 lifetime partners, current x 8 yrs   Lifestyle    Physical activity:     Days per week: Not on file     Minutes per session: Not on file    Stress: Not on file   Relationships    Social connections:     Talks on phone: Not on file     Gets together: Not on file     Attends religious service: Not  on file     Active member of club or organization: Not on file     Attends meetings of clubs or organizations: Not on file     Relationship status: Not on file    Intimate partner violence:     Fear of current or ex partner: Not on file     Emotionally abused:  Not on file     Physically abused: Not on file     Forced sexual activity: Not on file   Other Topics Concern    Not on file   Social History Narrative    07/09/2005    [name] family at age 35, husband from [place]    Works FT - [employer]    Pt thrilled with pregnancy-seeking pregnancy x 4 yrs, very anxious since sab 2 yrs ago    Has one child    Not working now    Lives with husband and daughter    Alton Revere, MD, 05/13/2016, 1:50 PM   Accompanied by her sister. Unchanged.    Family History:   Review of patient's family history indicates:  Problem: OTHER      Relation: Father          Age of Onset: (Not Specified)          Comment: leukemia   Problem: Hypertension      Relation: Father          Age of Onset: (Not Specified)  Problem: Lipids      Relation: Mother          Age of Onset: (Not Specified)  Problem: OTHER      Relation: Mother          Age of Onset: (Not Specified)          Comment: ESRD on HD  Problem: Diabetes      Relation: Mother          Age of Onset: (Not Specified)  Problem: Hypertension      Relation: Mother          Age of Onset: (Not Specified)  Problem: Renal      Relation: Mother          Age of Onset: (Not Specified)          Comment: on dialysis  Problem: Hypertension      Relation: Brother          Age of Onset: (Not Specified)  No history of pituitary disease. Unchanged.    Review of systems:  Rest of review of systems are negative except as stated in HPI.       Laboratory:  Component      Latest Ref Rng & Units 03/02/2018 03/31/2018   PROLACTIN      2.2 - 30.3 ng/mL 39.4 (H) 51.6 (H)   THYROID SCREEN TSH REFLEX FT4      0.358 - 2.130 uIU/mL     FOLLICLE STIMULATING HORMONE      mIU/mL  1.6   LIPASE      73 - 393 U/L     PATHOLOGY REPORT           SOMATOMEDIN C (IGF-1)      57 - 195 ng/mL  72   ADRENOCORTICOTROPIC HORMONE      7.2 - 63.3 pg/mL  45.4   CORTISOL AM      4.30 - 22.40 ug/dL  7.93   ESTRADIOL      .  pg/mL  158.8   TSH  (THYROID STIM HORMONE)      0.358 - 3.740 uIU/mL  2.550   FREE THYROXINE      0.76 - 1.46 ng/dL  0.79         Assessment/plan: The patient is a 48 year old female with history of clivus lesion causing pituitary stalk deviation, hyperprolactinemia, and oligomenorrhea here for follow up.     Initially, diagnosed with a pituitary adenoma by radiology, however, further evaluation by Dr. Vanessa Kick changed the diagnosis to a clivus lesion. While on carbergoline, her periods became regular. Has now been off cabergoline for 1.5 years. Repeat prolactin levels mildly elevated. Has periods 2-3 times a year and spotting on all other months. [place] levels have been in normal to high range consistent with peri-menopause. Most recently, estradiol level was normal. Will recheck her prolactin level. Order placed. Follow up pituitary MRI at [facility] in 03/2018 was stable.     Regarding the clivus lesion, Dr. Joycelyn Rua provided reassurance that most of the time these are benign lesions. Her imaging has been stable thus far. I also reassured the patient today. She will have f/u pituitary MRI in 1-2 years with appointment with Dr. Joycelyn Rua.     The patient understands and agrees with the plan. All questions were answered.       Follow up in 6 months.     Richardson Landry, MD

## 2018-09-09 ENCOUNTER — Other Ambulatory Visit (HOSPITAL_BASED_OUTPATIENT_CLINIC_OR_DEPARTMENT_OTHER): Payer: Self-pay

## 2018-09-13 ENCOUNTER — Other Ambulatory Visit (HOSPITAL_BASED_OUTPATIENT_CLINIC_OR_DEPARTMENT_OTHER): Payer: Self-pay

## 2018-09-13 ENCOUNTER — Other Ambulatory Visit: Payer: Self-pay

## 2018-09-13 ENCOUNTER — Ambulatory Visit
Admission: RE | Admit: 2018-09-13 | Discharge: 2018-09-13 | Disposition: A | Discharge status: Home / Self Care | Payer: No Typology Code available for payment source | Attending: "Endocrinology | Admitting: "Endocrinology

## 2018-09-13 DIAGNOSIS — E221 Hyperprolactinemia: Secondary | ICD-10-CM | POA: Diagnosis not present

## 2018-09-13 LAB — PROLACTIN: PROLACTIN: 73.1 ng/mL — ABNORMAL HIGH (ref 2.2–30.3)

## 2018-09-15 ENCOUNTER — Encounter (HOSPITAL_BASED_OUTPATIENT_CLINIC_OR_DEPARTMENT_OTHER): Payer: Self-pay | Admitting: Endocrinology

## 2018-09-20 ENCOUNTER — Other Ambulatory Visit: Payer: Self-pay

## 2018-09-20 ENCOUNTER — Emergency Department
Admission: EM | Admit: 2018-09-20 | Discharge: 2018-09-20 | Discharge status: Against Medical Advice | Payer: No Typology Code available for payment source | Source: Intra-hospital | Attending: Student in an Organized Health Care Education/Training Program | Admitting: Student in an Organized Health Care Education/Training Program

## 2018-09-20 DIAGNOSIS — Z5321 Procedure and treatment not carried out due to patient leaving prior to being seen by health care provider: Secondary | ICD-10-CM | POA: Diagnosis present

## 2018-09-20 NOTE — ED Provider Notes (Signed)
LWBS 

## 2018-09-23 ENCOUNTER — Telehealth (HOSPITAL_BASED_OUTPATIENT_CLINIC_OR_DEPARTMENT_OTHER): Payer: No Typology Code available for payment source | Admitting: "Endocrinology

## 2018-09-28 ENCOUNTER — Telehealth (HOSPITAL_BASED_OUTPATIENT_CLINIC_OR_DEPARTMENT_OTHER): Payer: Self-pay

## 2018-09-28 NOTE — Telephone Encounter (Signed)
-----   Message from Lujean Amel sent at 09/28/2018  1:41 PM EDT -----  Regarding: [place] [id number], 48 year old, female, Telephone Information:  Home Phone      [phone]  Work Phone      [phone]  Mobile          [phone]      Patient's Preferred Pharmacy:     [facility], [place]  Phone: [phone] Fax: [phone]      CONFIRMED TODAY: Yes    CALL BACK NUMBER: [phone]  Best time to call back:   Cell phone:   Other phone:    Available times:    Patient's language of care: English    Patient     Patient's PCP: Janann August, MD    Person calling on behalf of patient: Patient (self)    Calls today for [atient would like to discuss blood work results.

## 2018-09-28 NOTE — Progress Notes (Signed)
Returned call to patient, no answer. LVM to call office back.       [place] [id number], 48 year old, female, Telephone Information:   Home Phone   [phone]   Work Phone   [phone]   Mobile     [phone]       Patient's Preferred Pharmacy:     [facility], [place]   Phone: [phone] Fax: [phone]       CONFIRMED TODAY: [name] NUMBER: [phone]   Best time to call back:   Cell phone:   Other phone:     Available times:     Patient's language of care: English     Patient     Patient's PCP: Janann August, MD     Person calling on behalf of patient: Patient (self)     Calls today for [atient would like to discuss blood work results.

## 2018-10-01 ENCOUNTER — Telehealth (HOSPITAL_BASED_OUTPATIENT_CLINIC_OR_DEPARTMENT_OTHER): Payer: Self-pay | Admitting: "Endocrinology

## 2018-10-01 DIAGNOSIS — E221 Hyperprolactinemia: Secondary | ICD-10-CM

## 2018-10-01 NOTE — Progress Notes (Signed)
Returned patient's call about her prolactin level. Explained that the level remains mildly elevated and not concerning. We do not treat this levels since she is in perimenopause and is not having galactorrhea. Repeat prolactin in 6 months to ensure it remains stable. She already has TV scheduled for that time. All questions answered.

## 2018-12-17 ENCOUNTER — Ambulatory Visit: Payer: No Typology Code available for payment source | Attending: Internal Medicine | Admitting: Gastroenterology

## 2018-12-17 DIAGNOSIS — K219 Gastro-esophageal reflux disease without esophagitis: Secondary | ICD-10-CM | POA: Diagnosis present

## 2018-12-17 DIAGNOSIS — D126 Benign neoplasm of colon, unspecified: Secondary | ICD-10-CM | POA: Insufficient documentation

## 2018-12-17 DIAGNOSIS — K22719 Barrett's esophagus with dysplasia, unspecified: Secondary | ICD-10-CM | POA: Diagnosis not present

## 2018-12-17 NOTE — Progress Notes (Signed)
Date of Service: 12/17/2018    The patient had a TeleVisit for followup of her Barrett's esophagus and colon polyps as well as a question of a pancreas divisum that Dr. Rollene Rotunda raised.    I am performing this followup visit on behalf of Dr. Rollene Rotunda.    I reviewed the findings with the patient, and I told her that she has evidence of Barrett's esophagus as well as colon polyps and she needs to have a repeat upper endoscopy and colonoscopy in 2 years' time, which would be 3 years from the last procedures.    An MRI of the abdomen was performed, and this showed a normal pancreas and normal pancreatic duct with no evidence of pancreas divisum.  There were no abnormalities in the MRI anywhere.    I informed the patient about results and advised her to continue taking the omeprazole for her Barrett's esophagus and to follow up with Dr. Rollene Rotunda next year.    ___________________________  Reviewed and Electronically Signed By: Junius Argyle MD  Sig Date: 01/11/2019  Sig Time: 16:34:12  Dictated By: Junius Argyle MD  Dict Date: 12/17/2018 Dict Time: 10 59 AM    Dictation Date and Time:12/17/2018 10:59:57  Transcription Date and Time:12/17/2018 12:11:19  eScription Dictation [id number] Confirmation # [id number]      cc: Janann August MD

## 2018-12-17 NOTE — Progress Notes (Signed)
This office note has been dictated. Account number Data Unavailable

## 2019-03-01 IMAGING — MR PELVE / 48 ANOS
1 series · 10 of 10 positions shown · non-contrast
Comparison: none

[Series 403: ADC · 10 of 36 frames shown]
[frame 1/36]
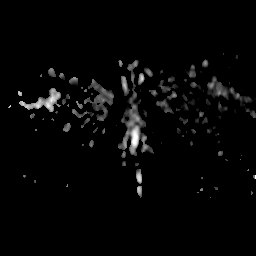
[frame 4/36]
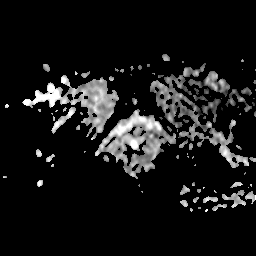
[frame 8/36]
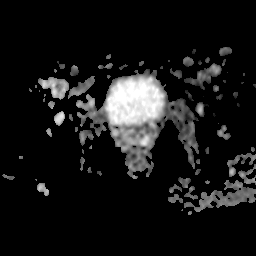
[frame 12/36]
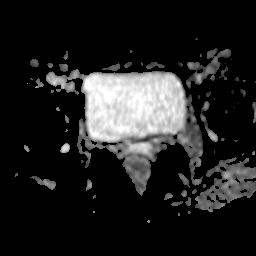
[frame 16/36]
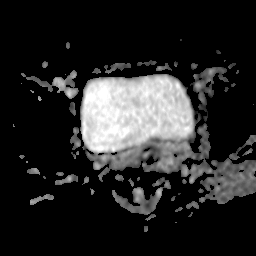
[frame 20/36]
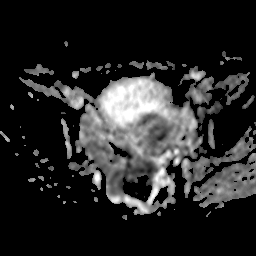
[frame 24/36]
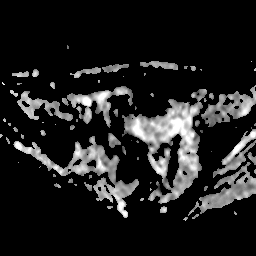
[frame 28/36]
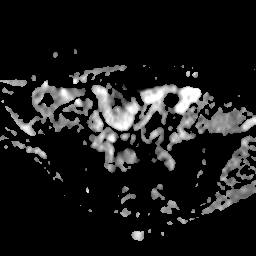
[frame 32/36]
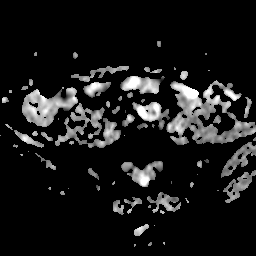
[frame 36/36]
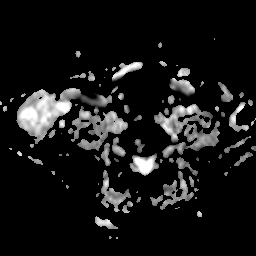

[10 of 10 positions shown; findings below may reference images not displayed]

TÉCNICA:
Foram realizadas aquisições multiplanares ponderadas em T1 e T2, algumas com técnica para 
supressão do sinal da gordura, antes e após a injeção do meio de contraste endovenoso. Foi 
utilizado gel endovaginal.
  RESSONÂNCIA MAGNÉTICA DA PELVE FEMININA

ANÁLISE:
Bexiga com moderada replecão e conteúdo homogêneo.
Útero em anteversoflexão, com forma, contornos, dimensões e intensidade de sinal habituais. Zona 
juncional e endométrio com espessura e intensidade de sinal normais.
Colo uterino com contornos regulares apresentando marcado hipossinal em T2, sem realce 
anômalo evidente ou áreas de restrição à difusão da água.
Ovários tópicos, com dimensões, contornos e sinais habituais para a faixa etária.
Uretra e canal anal sem alterações significativas.
Imagem cística com paredes finas e conteúdo homogêneo localizado na parede posterolateral 
esquerda do terço inferior da vagina, medindo 1,2 cm.
Espaços vesico-uterino, retovaginal e retouterino sem alterações significativas.
Planos adiposos periviscerais preservados.
Não há evidências de linfonodomegalias na escavação pélvica.

Ausência de líquido livre significativo na cavidade pélvica.

IMPRESSÃO:
- Colo uterino apresentando marcado hipossinal em T2, sem realce anômalo ou área de restrição à 
difusão, sugerindo alterações pós-tratamento.
- Pequena imagem cística na parede posterolateral esquerda do terço inferior da vagina, sugerindo 
cisto de Bartholin.

## 2019-03-01 IMAGING — MR PELVE /49 ANOS
1 series · 10 of 10 positions shown · non-contrast
Comparison: none

[Series 501: T2 · 10 of 30 frames shown]
[frame 1/30]
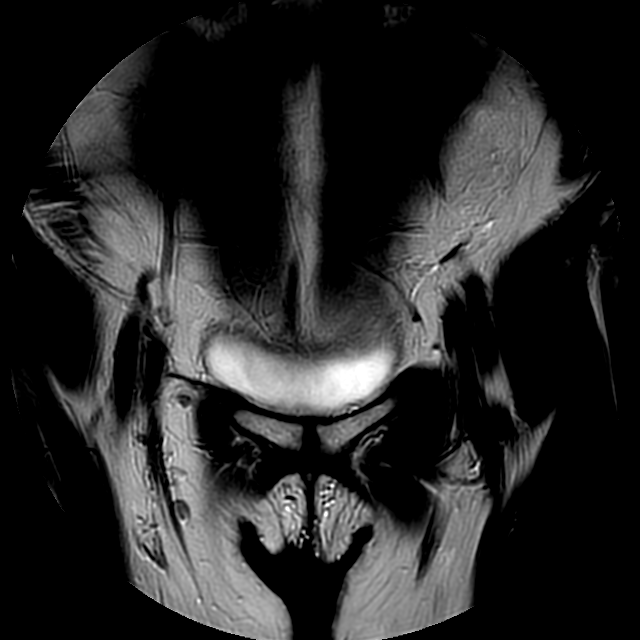
[frame 4/30]
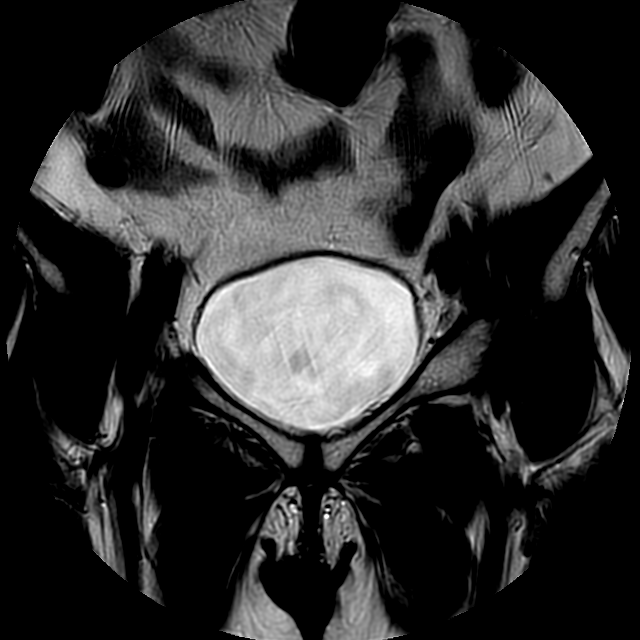
[frame 7/30]
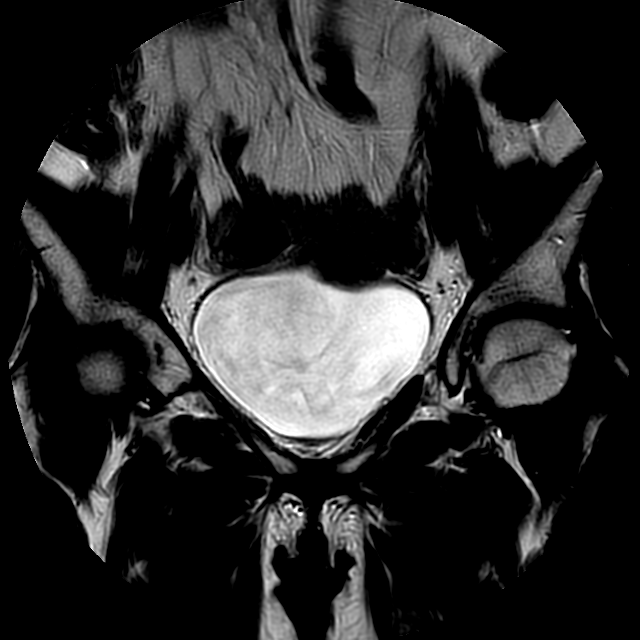
[frame 10/30]
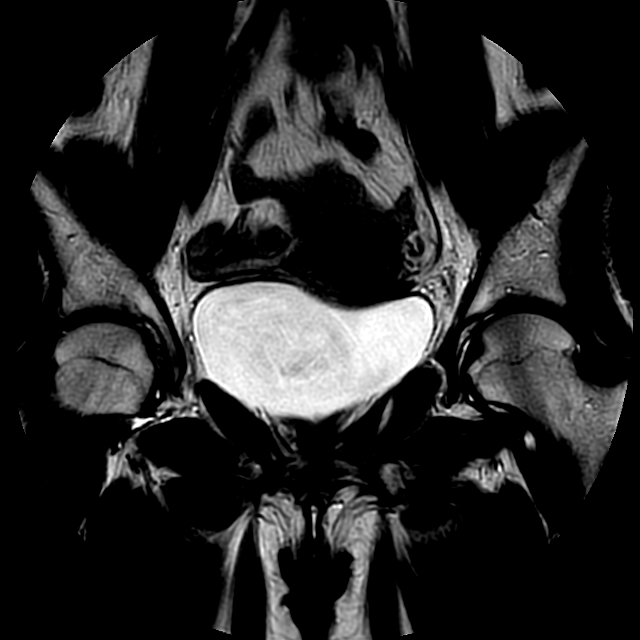
[frame 13/30]
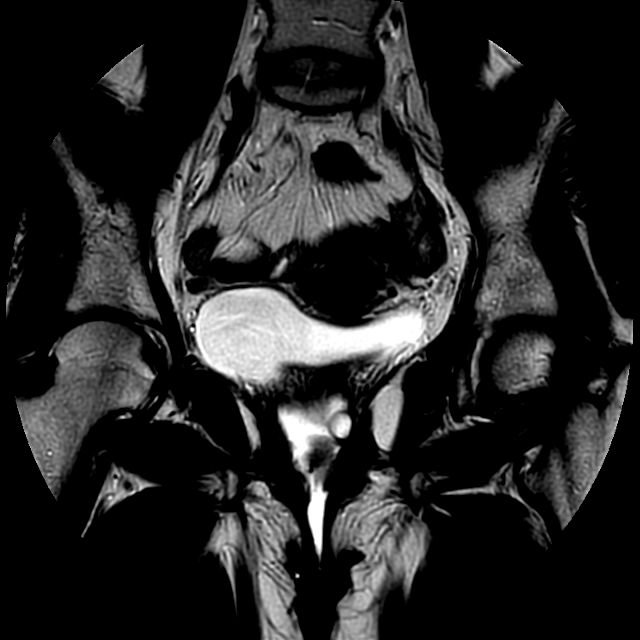
[frame 17/30]
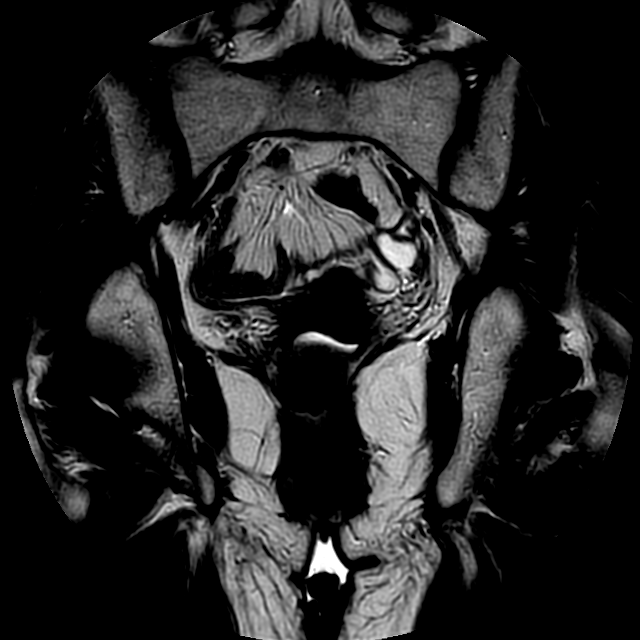
[frame 20/30]
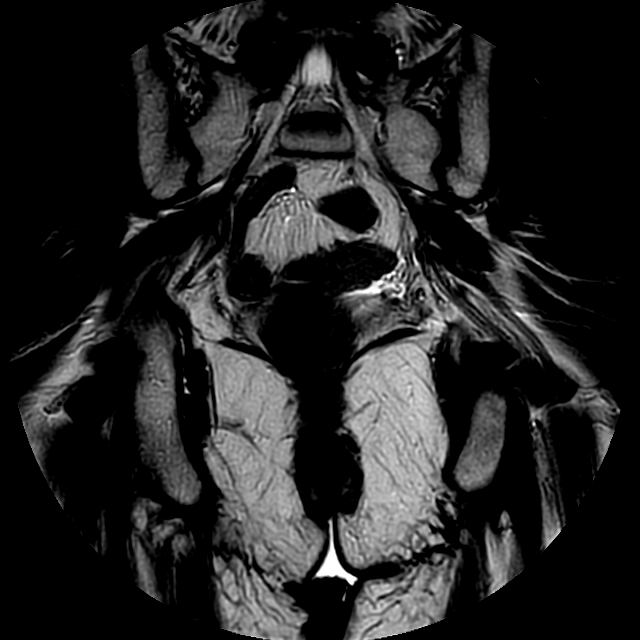
[frame 23/30]
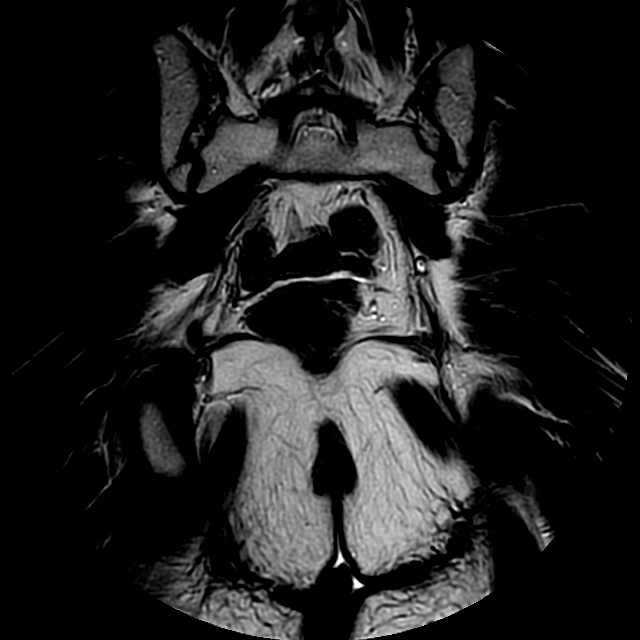
[frame 26/30]
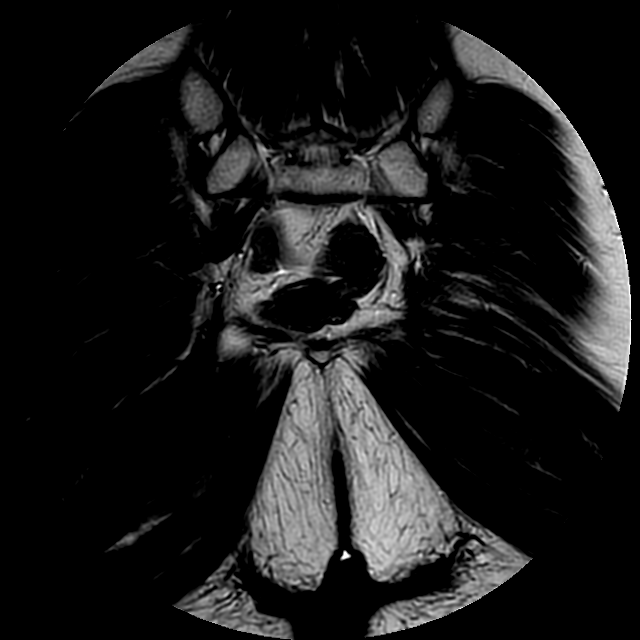
[frame 30/30]
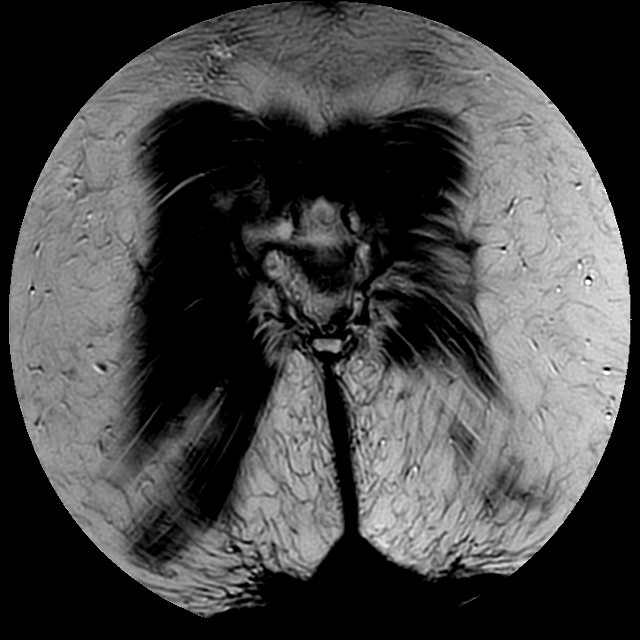

[10 of 10 positions shown; findings below may reference images not displayed]

TÉCNICA:
Foram realizadas aquisições multiplanares ponderadas em T1 e T2, algumas com técnica para 
supressão do sinal da gordura, antes e após a injeção do meio de contraste endovenoso. Foi 
utilizado gel endovaginal.
  RESSONÂNCIA MAGNÉTICA DA PELVE FEMININA

ANÁLISE:
Bexiga com moderada replecão e conteúdo homogêneo.
Útero em anteversoflexão, com forma, contornos, dimensões e intensidade de sinal habituais. Zona 
juncional e endométrio com espessura e intensidade de sinal normais.
Colo uterino com contornos regulares apresentando marcado hipossinal em T2, sem realce 
anômalo evidente ou áreas de restrição à difusão da água.
Ovários tópicos, com dimensões, contornos e sinais habituais para a faixa etária.
Uretra e canal anal sem alterações significativas.
Imagem cística com paredes finas e conteúdo homogêneo localizado na parede posterolateral 
esquerda do terço inferior da vagina, medindo 1,2 cm.
Espaços vesico-uterino, retovaginal e retouterino sem alterações significativas.
Planos adiposos periviscerais preservados.
Não há evidências de linfonodomegalias na escavação pélvica.

Ausência de líquido livre significativo na cavidade pélvica.

IMPRESSÃO:
- Colo uterino apresentando marcado hipossinal em T2, sem realce anômalo ou área de restrição à 
difusão, sugerindo alterações pós-tratamento.
- Pequena imagem cística na parede posterolateral esquerda do terço inferior da vagina, sugerindo 
cisto de Bartholin.

## 2019-03-10 ENCOUNTER — Ambulatory Visit (HOSPITAL_BASED_OUTPATIENT_CLINIC_OR_DEPARTMENT_OTHER): Payer: No Typology Code available for payment source | Admitting: "Endocrinology

## 2019-03-10 NOTE — Progress Notes (Deleted)
No show

## 2019-03-18 ENCOUNTER — Telehealth (HOSPITAL_BASED_OUTPATIENT_CLINIC_OR_DEPARTMENT_OTHER): Payer: Self-pay

## 2019-03-18 ENCOUNTER — Other Ambulatory Visit (HOSPITAL_BASED_OUTPATIENT_CLINIC_OR_DEPARTMENT_OTHER): Payer: Self-pay | Admitting: "Endocrinology

## 2019-03-18 DIAGNOSIS — E221 Hyperprolactinemia: Secondary | ICD-10-CM

## 2019-03-18 NOTE — Telephone Encounter (Signed)
CAll placed to pt.  She states that she missed you're telvisit call and asks about her pituitary lesion/ MRI scheduled in Feb. And does she need lab work

## 2019-03-18 NOTE — Telephone Encounter (Signed)
-----   Message from [facility] sent at 03/16/2019  4:30 PM EST -----  Regarding: Heath [id number], 49 year old, female, Telephone Information:  Home Phone      [phone]  Work Phone      [phone]  Mobile          [phone]      Patient's Preferred Pharmacy:     [facility], [place]  Phone: [phone] Fax: [phone]      CONFIRMED TODAY: Yes    CALL BACK NUMBER: [phone]  Best time to call back: anytime  Cell phone:   Other phone:    Available times:    Patient's language of care: English    Patient does not need an interpreter.    Patient's PCP: Janann August, MD    Person calling on behalf of patient: Patient (self)    Calls today to speak to nurse only.   Pt is returning a call. Pt want to talk tot the Doctor. Please call back for more details.

## 2019-03-28 ENCOUNTER — Ambulatory Visit
Admission: RE | Admit: 2019-03-28 | Discharge: 2019-03-28 | Disposition: A | Discharge status: Home / Self Care | Payer: No Typology Code available for payment source | Attending: "Endocrinology | Admitting: "Endocrinology

## 2019-03-28 ENCOUNTER — Other Ambulatory Visit (HOSPITAL_BASED_OUTPATIENT_CLINIC_OR_DEPARTMENT_OTHER): Payer: Self-pay

## 2019-03-28 ENCOUNTER — Other Ambulatory Visit: Payer: Self-pay

## 2019-03-28 DIAGNOSIS — E221 Hyperprolactinemia: Secondary | ICD-10-CM | POA: Insufficient documentation

## 2019-03-28 LAB — FREE THYROXINE: FREE THYROXINE: 0.66 ng/dL — ABNORMAL LOW (ref 0.76–1.46)

## 2019-03-28 LAB — PROLACTIN: PROLACTIN: 67 ng/mL — ABNORMAL HIGH (ref 2.2–30.3)

## 2019-04-04 ENCOUNTER — Telehealth (HOSPITAL_BASED_OUTPATIENT_CLINIC_OR_DEPARTMENT_OTHER): Payer: Self-pay | Admitting: "Endocrinology

## 2019-04-04 DIAGNOSIS — R946 Abnormal results of thyroid function studies: Secondary | ICD-10-CM

## 2019-04-04 NOTE — Telephone Encounter (Signed)
Called pt and discussed her lab results. She feels well, only has not had periods x 3 months.     Discussed her thyroid level was mildly low for the first time. This can be temporary or permanent. I recommend rechecking her level in 1 month and also checking antiTPO ab titer. Orders placed. She has an appt with me next month to review the results.     She will keep her appt with Dr. Vanessa Kick at [facility] as well.

## 2019-04-29 ENCOUNTER — Emergency Department (HOSPITAL_BASED_OUTPATIENT_CLINIC_OR_DEPARTMENT_OTHER): Payer: No Typology Code available for payment source

## 2019-04-29 ENCOUNTER — Emergency Department
Admission: EM | Admit: 2019-04-29 | Discharge: 2019-04-29 | Disposition: A | Discharge status: Home / Self Care | Payer: No Typology Code available for payment source | Source: Intra-hospital | Attending: Physician Assistant | Admitting: Physician Assistant

## 2019-04-29 DIAGNOSIS — M7732 Calcaneal spur, left foot: Secondary | ICD-10-CM | POA: Diagnosis not present

## 2019-04-29 DIAGNOSIS — L03116 Cellulitis of left lower limb: Secondary | ICD-10-CM | POA: Diagnosis present

## 2019-04-29 DIAGNOSIS — L02612 Cutaneous abscess of left foot: Secondary | ICD-10-CM | POA: Diagnosis not present

## 2019-04-29 DIAGNOSIS — M722 Plantar fascial fibromatosis: Secondary | ICD-10-CM

## 2019-04-29 DIAGNOSIS — M19072 Primary osteoarthritis, left ankle and foot: Secondary | ICD-10-CM | POA: Diagnosis not present

## 2019-04-29 DIAGNOSIS — L03119 Cellulitis of unspecified part of limb: Secondary | ICD-10-CM

## 2019-04-29 DIAGNOSIS — L02619 Cutaneous abscess of unspecified foot: Secondary | ICD-10-CM

## 2019-04-29 MED ORDER — NAPROXEN 250 MG PO TABS
500.00 mg | ORAL_TABLET | Freq: Once | ORAL | Status: AC
Start: 2019-04-29 — End: 2019-04-29
  Administered 2019-04-29: 500 mg via ORAL
  Filled 2019-04-29: qty 2

## 2019-04-29 MED ORDER — NAPROXEN 500 MG PO TABS
500.00 mg | ORAL_TABLET | Freq: Two times a day (BID) | ORAL | 0 refills | Status: AC | PRN
Start: 2019-04-29 — End: 2019-05-14

## 2019-04-29 MED ORDER — CEPHALEXIN 500 MG PO CAPS
500.00 mg | ORAL_CAPSULE | Freq: Four times a day (QID) | ORAL | 0 refills | Status: AC
Start: 2019-04-29 — End: 2019-05-06

## 2019-04-29 MED ORDER — CEPHALEXIN 500 MG PO CAPS
500.00 mg | ORAL_CAPSULE | Freq: Once | ORAL | Status: AC
Start: 2019-04-29 — End: 2019-04-29
  Administered 2019-04-29: 500 mg via ORAL
  Filled 2019-04-29: qty 1

## 2019-04-29 NOTE — ED Provider Notes (Signed)
ED nursing record was reviewed. Prior records as available electronically through the Epic record were reviewed.        HPI:    This 49 year old female with a history of obesity, anxiety, borderline diabetes, GERD, presents to the Emergency Department with chief complaint of left ankle/heel pain.  Patient reports atraumatic heel/ankle pain starting yesterday, denies any particular prolonged walking, exertion, no trauma.  Denies any paresthesias weakness.  Took Tylenol and ibuprofen with some improvement.  Denies any history of gout, other arthralgias, myalgias, fevers, chills, nausea, vomiting.  Denies any bleeding.      ROS: Pertinent positives were reviewed as per the HPI above. All other systems were reviewed and are negative.    Past Medical History/Problem list:  Past Medical History:  06/12/2004: ACUTE GASTRITIS W/O HEMORRHAGE      Comment:  H. Pylori positive (done at [facility]); GI consult Dr.               Phillis Knack, TOC 10/01/04  03/26/2004: ALLERGY, UNSPECIFIED      Comment:  Seen by ENT, deviated septum, hypertrophy of inferior                nasal turbinates  No date: Anxiety  No date: Asthma  07/26/2004: CLOSTRIDIUM DIFFICILE      Comment:  GI cosult: Dr. Rollene Rotunda, TOC done on 10/01/04  No date: Elevated cholesterol      Comment:  resolved without meds  No date: Esophageal reflux  No date: Snores  No date: Stomach disease  No date: Unspecified asthma(493.90)  No date: Wears eyeglasses      Comment:  night driving  Patient Active Problem List:     Allergy     Acute gastritis without mention of hemorrhage     Intestinal infection due to Clostridium difficile     Pure hypercholesterolemia     Major depressive disorder, recurrent episode, moderate with anxious distress (HCC)     Generalized anxiety disorder     Benign positional vertigo     Contraceptive management     Tubular adenoma of colon     GERD (gastroesophageal reflux disease)     Diarrhea     Cholecystitis     Epigastric abdominal pain     Mass of  ampulla of Vater     Gastroesophageal reflux disease with esophagitis     Obesity     Colon polyp     Mucosal abnormality of duodenum     Abnormal LFTs     Allergic rhinitis due to pollen     Anisometropic amblyopia of right eye     Barrett's esophagus     Disorder of biliary tract     Gastroesophageal reflux disease     Mild intermittent asthma without complication     Prediabetes     Tinea pedis of both feet     Perimenopause     Hyperprolactinemia (HCC)     Morbid obesity with BMI of 50.0-59.9, adult (HCC)     Hyperlipidemia     Abnormal MRI of head     Headache disorder        Past Surgical History: Past Surgical History:  No date: CHOLECYSTECTOMY  No date: NO SIGNIFICANT SURGICAL HISTORY  No date: OB ANTEPARTUM CARE CESAREAN DLVR & POSTPARTUM      Medications: No current facility-administered medications on file prior to encounter.  omeprazole (PRILOSEC) 40 MG capsule, Take 1 capsule by mouth daily, Disp: 30 capsule, Rfl:  2  atorvastatin (LIPITOR) 40 MG tablet, Take 1 tablet by mouth daily, Disp: 30 tablet, Rfl: 3  DULoxetine (CYMBALTA) 30 MG capsule, Take 1 each morning., Disp: 30 capsule, Rfl: 0  albuterol (PROVENTIL HFA,VENTOLIN HFA, PROAIR HFA) 108 (90 Base) MCG/ACT inhaler, Inhale 2 puffs into the lungs every 4 (four) hours as needed for Wheezing or Shortness of breath, Disp: 1 Inhaler, Rfl: 1          Social History:   Social History     Socioeconomic History    Marital status: Married     Spouse name: Not on file    Number of children: Not on file    Years of education: Not on file    Highest education level: Not on file   Occupational History    Not on file   Tobacco Use    Smoking status: Never Smoker    Smokeless tobacco: Never Used   Substance and Sexual Activity    Alcohol use: No    Drug use: No    Sexual activity: Yes     Partners: Male     Comment: men age 21 q 4-6 wks x 3d, small fibroid on u/s, 1st SA age 47, 2 lifetime partners, current x 8 yrs   Other Topics Concern    Not on file    Social History Narrative    07/09/2005    [name] family [place] age 14, husband from [place]    Works FT - [employer]    Pt thrilled with pregnancy-seeking pregnancy x 4 yrs, very anxious since sab 2 yrs ago    Has one child    Not working now    Lives with husband and daughter    Alton Revere, MD, 05/13/2016, 1:50 PM   Social Determinants of Health  Financial Resource Strain:     Difficulty of Paying Living Expenses:   Food Insecurity:     Worried About Charity fundraiser in the Last Year:     Arboriculturist in the Last Year:   Transportation Needs:     Film/video editor (Medical):     Lack of Transportation (Non-Medical):   Physical Activity:     Days of Exercise per Week:     Minutes of Exercise per Session:   Stress:     Feeling of Stress :   Social Connections:     Frequency of Communication with Friends and Family:     Frequency of Social Gatherings with Friends and Family:     Attends Religious Services:     Active Member of Clubs or Organizations:     Attends Archivist Meetings:     Marital Status:   Intimate Partner Violence:     Fear of Current or Ex-Partner:     Emotionally Abused:     Physically Abused:     Sexually Abused:         Allergies:  Review of Patient's Allergies indicates:   Erythromycin            Itching   Erythromycin            Itching   Macrolides and keto*    Itching, Rash      Physical Exam:  BP 134/85    Pulse 94    Temp 98 F    Resp 18    Wt 130.6 kg (288 lb)    LMP 02/13/2019 (  Approximate)    SpO2 98%    BMI 51.02 kg/m     GENERAL: Well appearing, No acute distress, non-toxic.   SKIN/NEUROLOGICAL/MSK/VASCULAR: Dry feet bilaterally, there is a area of confluent erythema to the lateral heel measuring approximately 5 cm, increased warmth, overlying cracked skin.  Focally tender with tenderness radiating to the heel and the lateral malleolus.  No other focal bony tenderness, full  range of motion of the left ankle with intact strength, sensation.  DP pulses are 2+.  No calf tenderness no palpable cords.  No ecchymosis abrasions or lacerations.  LUNGS:  Clear to auscultation bilaterally. No wheezes, rales, rhonchi.   HEART:  RRR.  Good S1, S2, No appreciated murmurs.       RESULTS  No results found for this visit on 04/29/19 (from the past 24 hour(s)).       MEDICATIONS ADMINISTERED ON THIS VISIT  Orders Placed This Encounter      cephALEXin (KEFLEX) capsule 500 mg          Order Specific Question: Specify indication:          Answer: Infection - organism unknown          Order Specific Question: Indicate type of infection:          Answer: Cellulitis - Skin/Soft Tissue          Order Specific Question: Indicate type of therapy:          Answer: New antimicrobial therapy      naproxen (NAPROSYN) tablet 500 mg      cephALEXin (KEFLEX) 500 MG capsule          Sig: Take 1 capsule by mouth 4 (four) times daily  for 7 days          Dispense:  28 capsule          Refill:  0      naproxen (NAPROSYN) 500 MG tablet          Sig: Take 1 tablet by mouth every 12 (twelve) hours as needed as needed for pain. Take with food  for up to 15 days          Dispense:  20 tablet          Refill:  0         ED Course and Medical Decision-making:  This 49 year old female patient presenting with pain both at the lateral ankle and heel on her left lower extremity.  Patient noted pain over the past few days, no clear trauma or trigger.  On exam she has a area of erythema over very dry skin at the lateral heel/malleolus, concerning for cellulitis.  No abscess, lacerations.  She was also found to have tenderness at the heel where one would expect discomfort from plantar fasciitis.  X-rays of left ankle does show spurring and DJD.  No other acute abnormalities, preliminary reading.  I think the best course of action for this patient is to treat both with antibiotic for the skin infection as well with a regimented course  of NSAIDs for the tendinitis with cool compresses, stretching exercises and follow-up with PCP. Warning signs and symptoms to monitor and return to the ER were discussed in details in patient's native language. Pt verbalized agreement and understanding signs, symptoms and disposition plan.         Condition: Improved.  Diagnosis/Diagnoses:  Plantar fasciitis  Cellulitis and abscess of foot    Disposition: Discharge  Samona Chihuahua V. Mindi Curling  Emergency [place]    This Emergency Department patient encounter note was created using voice-recognition software and in real time during the ED visit. Please excuse any typographical errors that have not been edited out.

## 2019-04-29 NOTE — Discharge Instructions (Addendum)
Please take the Keflex as prescribed until finished to help fight the skin infection on the side of the foot.  You should also follow-up with your primary care physician to monitor improvement.  It is important to take good care of your feet, use moisturizing treatments daily after showering at night and put a sock over your feet to help absorption overnight.  Naproxen will help with the pain at the heel, take it as directed until pain resolved.  Please do the stretching  exercise and cool compresses as discussed.  Follow-up with your primary care physician.  Return to the ER with worsening symptoms or additional concerns.

## 2019-04-29 NOTE — Narrator Note (Signed)
Patient Disposition  Patient education for diagnosis, medications, activity, diet and follow-up.  Patient left ED 7:57 PM.  Patient rep received written instructions.    Interpreter to provide instructions: No    Patient belongings with patient: YES    Have all existing LDAs been addressed? N/A    Have all IV infusions been stopped? N/A    Destination: Discharged to home

## 2019-04-29 NOTE — Narrator Note (Signed)
Pt in agreement with plan of care for discharge. Pt provided verbal and written discharge instructions in native language . Pt verbalizes understanding to follow up with PCP .

## 2019-04-29 NOTE — Narrator Note (Signed)
Pt to xray

## 2019-04-29 NOTE — ED Triage Note (Signed)
Pt reports L ankle/foot pain that wraps from medial to lateral side beginning yesterday. Denies known trauma/injury. No swelling noted. Minimal relief w/ tylenol and motrin.

## 2019-05-05 ENCOUNTER — Encounter (HOSPITAL_BASED_OUTPATIENT_CLINIC_OR_DEPARTMENT_OTHER): Payer: Self-pay | Admitting: "Endocrinology

## 2019-05-05 ENCOUNTER — Other Ambulatory Visit (HOSPITAL_BASED_OUTPATIENT_CLINIC_OR_DEPARTMENT_OTHER): Payer: Self-pay

## 2019-05-05 ENCOUNTER — Ambulatory Visit: Payer: No Typology Code available for payment source | Attending: "Endocrinology | Admitting: "Endocrinology

## 2019-05-05 DIAGNOSIS — E221 Hyperprolactinemia: Secondary | ICD-10-CM | POA: Insufficient documentation

## 2019-05-05 DIAGNOSIS — R946 Abnormal results of thyroid function studies: Secondary | ICD-10-CM | POA: Insufficient documentation

## 2019-05-05 DIAGNOSIS — N926 Irregular menstruation, unspecified: Secondary | ICD-10-CM | POA: Diagnosis not present

## 2019-05-05 NOTE — Progress Notes (Signed)
The patient is a 49 year old female with history of microprolactinoma here for follow up. She was last seen in 08/2018.     HPI: She presented with oligomenorrhea for 1 year. Prior to then, she had monthly menses for the most part. 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 Her TSH, testosterone, FSH, and estradiol levels were normal. 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 There was no mention of optic nerve involvement, but there was deviation of the stalk to the left.   Denies galactorrhea or breast tenderness. There were no gross neurological deficits on exam.    Her daughter is age 50 years. She conceived after 10 years of trying. She was never told her prolactin was elevated in the past.     I started cabergoline 0.25 mg twice a week (Mondays and Thursdays) and her prolactin decreased into the normal range. In addition, full pituitary hormone evaluation was completed and negative for other pituitary hormone excess or deficiency. Periods resumed and were monthly. Repeat prolactin level in May 2019 was low end of normal. I decreased the cabergoline to 0.25 mg weekly. She repeated her 1 year pituitary MRI and this showed no pituitary adenoma, however a clivus tumor was noted. We referred her to [facility] and she was seen by Dr. 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 Her cabergoline was stopped by Dr. Joycelyn Rua. Repeat prolactin level returned mildly high off treatment. Since stopping the carbergoline, she had only 2-3 periods this year. Also reports hot flashes at times. [place] level was in menopausal range. She had a f/u appt with Dr. Vanessa Kick in 03/2018 and plan is to repeat her MRI in 1 year, stay off cabergoline, and continue monitoring her prolactin level.     Interim history: she repeated her labs last month and is here to review the results.     Today,  she reports feeling well overall. No fatigue, constipation, cold intolerance. Has pelvic cramps but periods have not restarted. No period for 2-3 months except spotting.     I have reviewed the patient's medical, social, family history and meds in detail and updated the computerized patient record.      Review of systems:  Rest of review of systems are negative except as stated in HPI.       Laboratory:  Component      Latest Ref Rng & Units 03/28/2019   FREE THYROXINE      0.76 - 1.46 ng/dL 0.66 (L)   PROLACTIN      2.2 - 30.3 ng/mL 67.0 (H)     PROLACTIN (ng/mL)   Date Value   03/28/2019 67.0 (H)   09/13/2018 73.1 (H)   03/31/2018 51.6 (H)     THYROID SCREEN TSH REFLEX FT4 (uIU/mL)   Date Value   08/27/2017 2.818   08/18/2016 2.410   05/14/2016 2.510   TSH 12/2018 at [facility]      Assessment/plan: The patient is a 49 year old female with history of clivus lesion causing pituitary stalk deviation, hyperprolactinemia, and oligomenorrhea here for follow up.     Initially, diagnosed with a pituitary adenoma by radiology, however, further evaluation by Dr. Vanessa Kick changed the diagnosis to a clivus lesion. While on carbergoline, her periods became regular. Has now been off cabergoline for 2 years. Repeat prolactin levels mildly elevated. Has  periods 2-3 times a year and spotting on all other months. [place] levels have been in normal to high range consistent with peri-menopause.  Prolactin level is stable in the mildly elevated range. Will recheck [facility] level.     Her Ft4 was low. I recommend rechecking her TFTs to see if this has resolved. This was ordered. She is currently asymptomatic except for irregular periods.     Regarding the clivus lesion, Dr. Joycelyn Rua provided reassurance that most of the time these are benign lesions. Her imaging has been stable thus far. Has f/u imaging scheduled for next month at [facility].     The patient understands and agrees with the plan. All questions were answered.       Follow up in 6 months.      Richardson Landry, MD

## 2019-05-09 ENCOUNTER — Ambulatory Visit
Admission: RE | Admit: 2019-05-09 | Discharge: 2019-05-09 | Disposition: A | Discharge status: Home / Self Care | Payer: No Typology Code available for payment source | Attending: "Endocrinology | Admitting: "Endocrinology

## 2019-05-09 ENCOUNTER — Other Ambulatory Visit: Payer: Self-pay

## 2019-05-09 DIAGNOSIS — R946 Abnormal results of thyroid function studies: Secondary | ICD-10-CM | POA: Diagnosis present

## 2019-05-09 DIAGNOSIS — N926 Irregular menstruation, unspecified: Secondary | ICD-10-CM | POA: Insufficient documentation

## 2019-05-09 LAB — FOLLICLE STIMULATING HORMONE: FOLLICLE STIMULATING HORMONE: 12.2 m[IU]/mL

## 2019-05-09 LAB — TOTAL T3 (TRIIODOTHYRONINE): TOTAL T3 (TRIIODOTHYRONINE): 97 ng/dL (ref 60–181)

## 2019-05-09 LAB — TSH (THYROID STIMULATING HORMONE): TSH (THYROID STIM HORMONE): 2.4 u[IU]/mL (ref 0.358–3.740)

## 2019-05-09 LAB — THYROXINE (T4): THYROXINE (T4): 7.1 ug/dL (ref 4.8–13.9)

## 2019-05-10 ENCOUNTER — Telehealth (HOSPITAL_BASED_OUTPATIENT_CLINIC_OR_DEPARTMENT_OTHER): Payer: Self-pay

## 2019-05-10 NOTE — Telephone Encounter (Signed)
Message forwarded to Dr Allyne Gee     Calls today for test result(s).     Pt would like to obtain her test results. Please call back, thank you.

## 2019-05-11 NOTE — Telephone Encounter (Signed)
Please call pt and let her know that everything was normal:    Component      Latest Ref Rng & Units [id number]   FOLLICLE STIMULATING HORMONE      mIU/mL 12.2   THYROXINE (T4)      4.8 - 13.9 ug/dL 7.1   TSH (THYROID STIM HORMONE)      0.358 - 3.740 uIU/mL 2.400   TOTAL T3 (TRIIODOTHYRONINE)      60 - 181 ng/dL 97

## 2019-05-11 NOTE — Telephone Encounter (Signed)
Pt called. Lab results provided. Pt asked what could have caused her thyroid levels to be low previously and normal now. She was pleased with her results.

## 2019-11-16 ENCOUNTER — Other Ambulatory Visit: Payer: Self-pay

## 2019-11-18 ENCOUNTER — Telehealth (HOSPITAL_BASED_OUTPATIENT_CLINIC_OR_DEPARTMENT_OTHER): Payer: Self-pay | Admitting: Physician Assistant

## 2019-11-18 ENCOUNTER — Telehealth (HOSPITAL_BASED_OUTPATIENT_CLINIC_OR_DEPARTMENT_OTHER): Payer: Self-pay

## 2019-11-18 DIAGNOSIS — D126 Benign neoplasm of colon, unspecified: Secondary | ICD-10-CM

## 2019-11-18 DIAGNOSIS — K625 Hemorrhage of anus and rectum: Secondary | ICD-10-CM

## 2019-11-18 NOTE — Telephone Encounter (Signed)
Received message from staff regarding this patient of Dr. Huntley Dec with concerns regarding symptoms and request to further discuss with provider.     Called patient today. She reports that she has been having some intermittent abdominal cramping, bloating, and nausea. Denies vomiting. Has also been seeing BRB on toilet paper after BMs when she strains for BM. Denies melena. Reports that she thinks the bleeding may be due to hx of hemorrhoids. Currently having 1 BM/day with occasional straining. Sometimes feeling rectal pressure when sitting, this improves some when she passes gas and is not present when she stands or moves. Denies change in appetite, weight loss, SOB, chest pain, fever.     Continues on omeprazole 40 mg qd.    Pt notes that she is very anxious regarding these symptoms and also given her FH of colon cancer.    Per chart review, pt is due for next colonoscopy 11/2020    Given pt concern and reported rectal bleeding, offered pt to have colonoscopy done sooner. Pt would like to have colonoscopy sooner rather than waiting for next year.    Discussed with GI attending Dr. Rollene Rotunda who is in agreement with the above recommendations.    Order for colonoscopy placed and message sent to staff to assist with scheduling and review of prep.     Pt understands and agrees with plan. All questions answered during telephone call. Pt will call our office with any additional questions or concerns.      Willia Craze Masson Nalepa, PA-C

## 2019-11-18 NOTE — Telephone Encounter (Signed)
Received message from [place] requesting to assist in booking colonoscopy with Dr Rollene Rotunda.  Called and spoke with patient , Alejandra Martin advised on Miralax/Dulcolax prep, info reviewed and emailed.  Patient scheduled on 10/04 at [facility] to arrive at 10:30am.    Colonoscopy Instructions  Miralax and Dulcolax Preparation    WHAT TO BUY  . 4 Dulcolax (or generic bisacodyl laxative) tablets  . 1 large bottle of Miralax (8.3 ounces or 238 grams)  . 64 ounces of Gatorade (no red or purple colors)  7 DAYS BEFORE THE TEST        . DO NOT TAKE: Nonsteroidal anti-inflammatory medications, these include Ibuprofen, Motrin, Advil, Naproxen, Aleve, Naprosyn, Meloxicam, and many others  . You may take Tylenol (which is acetaminophen) for pain  . Some medical conditions require you to stay on Aspirin. Check with your primary care provider. Do not stop Aspirin until you speak with your health care provider.   3 DAYS BEFORE THE TEST      Stop eating high fiber foods such as corn, beans, seeds, nuts, whole grain breads, or fruit skins (pear, apple, etc.)    2 DAYS BEFORE THE TEST  . Have a light dinner no later than 7 PM    1 DAY BEFORE THE TEST  . Begin a clear liquid diet - a clear liquid means you can see through it    Clear Liquids Not Clear Liquids   Water, Gatorade, Powerade, or Pedialyte No red or purple items   Black coffee or tea (no milk, cream) No alcohol   Clear broth No milk, cream, other dairy products   Ginger ale or Sprite No noodles, rice, or vegetables in soup   Apple juice No juice with pulp   Jell-o, popsicles  No liquid you cannot see through     . No solid food for the entire day  . In the morning: take 2 dulcolax tablets  . Prepare the laxative: mix 64 oz of Gatorade with 8.3 oz of Miralax, then put it in the refrigerator  . Starting at 4 PM, drink one glass (8 ounces) of laxative every 30 minutes until finished. Be sure to stay close to a bathroom once you start the prep.  . At 9 PM, take 2 gas pills (simethicone) with  1 glass (8 ounces) of clear liquid  . At 10 PM, take 2 more gas pills (simethicone) with 1 glass (8 ounces) of clear liquid  . Before bed: take 2 dulcolax tablets    DAY OF THE COLONOSCOPY  . The morning of the test, you can take your other medications at the usual time with only a sip of water. These include blood pressure pills, seizure medications, heart medications, thyroid medications, etc.  . Do not take pills for diabetes. If you have diabetes, please follow the colonoscopy preparation for people with diabetes handout  . Do not drink anything for 3 hours before the test

## 2019-11-20 ENCOUNTER — Encounter (HOSPITAL_BASED_OUTPATIENT_CLINIC_OR_DEPARTMENT_OTHER): Payer: Self-pay | Admitting: Gastroenterology

## 2019-11-20 NOTE — Progress Notes (Signed)
Received e mail.    Vic Blackbird, RN  Lucio Edward, MD  [facility] Ronalee Belts,   Pt calls stating she is having abd cramping, rectal pressure when she sits down and occasional blood in her stool. Your next available appointment is Jan and she won't wait that long. "I have not seen Dr.Raynold Blankenbaker or heard from him in almost 2 years. I need to see him as soon as possible." She had a televisit with Dr.Fawaz, for you, 12/17/18 and he told her f/u with you in 1 year. I told her if she was that uncomfortable that she should go to [facility] or the ED to be physically examined. "I,m not going to the [facility] or the ED and if I can't see or hear from Dr.Trula Frede asap I will get another doctor." I told her that I would relay the message to you.   Peg       Has televisit for tomorrow. Last colonoscopy was 12/15/2017. Will move up.

## 2019-11-21 ENCOUNTER — Ambulatory Visit (HOSPITAL_BASED_OUTPATIENT_CLINIC_OR_DEPARTMENT_OTHER): Payer: No Typology Code available for payment source | Admitting: Anesthesiology

## 2019-11-21 ENCOUNTER — Ambulatory Visit
Admission: RE | Admit: 2019-11-21 | Discharge: 2019-11-21 | Disposition: A | Discharge status: Home / Self Care | Payer: No Typology Code available for payment source | Attending: "Endocrinology | Admitting: "Endocrinology

## 2019-11-21 ENCOUNTER — Encounter (HOSPITAL_BASED_OUTPATIENT_CLINIC_OR_DEPARTMENT_OTHER): Payer: Self-pay

## 2019-11-21 ENCOUNTER — Other Ambulatory Visit: Payer: Self-pay

## 2019-11-21 DIAGNOSIS — E785 Hyperlipidemia, unspecified: Secondary | ICD-10-CM | POA: Insufficient documentation

## 2019-11-21 DIAGNOSIS — J45909 Unspecified asthma, uncomplicated: Secondary | ICD-10-CM | POA: Insufficient documentation

## 2019-11-21 DIAGNOSIS — D123 Benign neoplasm of transverse colon: Secondary | ICD-10-CM | POA: Diagnosis not present

## 2019-11-21 DIAGNOSIS — K625 Hemorrhage of anus and rectum: Secondary | ICD-10-CM

## 2019-11-21 DIAGNOSIS — K635 Polyp of colon: Secondary | ICD-10-CM | POA: Diagnosis not present

## 2019-11-21 DIAGNOSIS — D126 Benign neoplasm of colon, unspecified: Secondary | ICD-10-CM

## 2019-11-21 DIAGNOSIS — F419 Anxiety disorder, unspecified: Secondary | ICD-10-CM | POA: Diagnosis not present

## 2019-11-21 DIAGNOSIS — K921 Melena: Secondary | ICD-10-CM | POA: Diagnosis present

## 2019-11-21 DIAGNOSIS — K621 Rectal polyp: Secondary | ICD-10-CM | POA: Insufficient documentation

## 2019-11-21 LAB — URINE PREGNANCY TEST (POINT OF CARE): HCG QUALITATIVE URINE: NEGATIVE

## 2019-11-21 MED ORDER — LACTATED RINGERS IV BOLUS
INTRAVENOUS | Status: DC | PRN
Start: 2019-11-21 — End: 2019-11-21

## 2019-11-21 MED ORDER — METOPROLOL TARTRATE 5 MG/5ML IV SOLN
INTRAVENOUS | Status: AC
Start: 2019-11-21 — End: 2019-11-21
  Filled 2019-11-21: qty 5

## 2019-11-21 MED ORDER — METOPROLOL TARTRATE 5 MG/5ML IV SOLN
Freq: Once | INTRAVENOUS | Status: DC | PRN
Start: 2019-11-21 — End: 2019-11-21
  Administered 2019-11-21: 5 mg via INTRAVENOUS

## 2019-11-21 MED ORDER — PROPOFOL 500 MG/50 ML IV
Freq: Once | INTRAVENOUS | Status: DC | PRN
Start: 2019-11-21 — End: 2019-11-21
  Administered 2019-11-21: 50 ug/kg/min via INTRAVENOUS
  Administered 2019-11-21: 80 mg via INTRAVENOUS
  Administered 2019-11-21: 200 ug/kg/min via INTRAVENOUS
  Administered 2019-11-21: 100 ug/kg/min via INTRAVENOUS

## 2019-11-21 MED ORDER — PROPOFOL INFUSION
INTRAVENOUS | Status: AC
Start: 2019-11-21 — End: 2019-11-21
  Filled 2019-11-21: qty 50

## 2019-11-21 MED ORDER — HYDROCORTISONE 2.5 % EX CREA
TOPICAL_CREAM | Freq: Two times a day (BID) | CUTANEOUS | 0 refills | Status: AC
Start: 2019-11-21 — End: 2020-02-19

## 2019-11-21 MED ORDER — LIDOCAINE HCL (PF) 2 % IJ SOLN
INTRAMUSCULAR | Status: AC
Start: 2019-11-21 — End: 2019-11-21
  Filled 2019-11-21: qty 5

## 2019-11-21 MED ORDER — LACTATED RINGERS IV SOLN
INTRAVENOUS | Status: DC
Start: 2019-11-21 — End: 2019-11-22

## 2019-11-21 NOTE — Procedures (Deleted)
Alejandra Martin, Alejandra Martin, MBBS, MD - 09/26/2014  Formatting of this note might be different from the original.    TECHNIQUE:  Transabdominal and transvaginal ultrasound imaging of the pelvis was performed.   3D reconstructions were created and reviewed    COMPARISON: None available.    FINDINGS:    KIDNEYS: Unremarkable.    UTERUS:  The uterus measures 11.1 x 4.1 x 5.3 cm.  The endometrial echocomplex  measures 4 mm.      OVARIES/ADN EXA: The left ovary measures 2.7 x 1.6 x 1.9 cm and the right ovary  measures 2.4 x 1.3 x 2.1 cm. No adnexal masses are seen.    PELVIS:  No free fluid.      IMPRESSION:   IMPRESSION:    Normal sonographic appearance of the uterus and ovaries.

## 2019-11-21 NOTE — Discharge Instructions (Signed)
Colon Polyps    Polyps are tissue growths inside the body. Polyps can grow in many places, including the large intestine (colon). A polyp may be a round bump or a mushroom-shaped growth. You could have one polyp or several.  Most colon polyps are noncancerous (benign). However, some colon polyps can become cancerous over time. Finding and removing the polyps early can help prevent this.  What are the causes?  The exact cause of colon polyps is not known.  What increases the risk?  You are more likely to develop this condition if you:   Have a family history of colon cancer or colon polyps.   Are older than 20 or older than 45 if you are African American.   Have inflammatory bowel disease, such as ulcerative colitis or Crohn's disease.   Have certain hereditary conditions, such as:  ? Familial adenomatous polyposis.  ? Lynch syndrome.  ? Turcot syndrome.  ? Peutz-Jeghers syndrome.   Are overweight.   Smoke cigarettes.   Do not get enough exercise.   Drink too much alcohol.   Eat a diet that is high in fat and red meat and low in fiber.   Had childhood cancer that was treated with abdominal radiation.  What are the signs or symptoms?  Most polyps do not cause symptoms.  If you have symptoms, they may include:   Blood coming from your rectum when having a bowel movement.   Blood in your stool. The stool may look dark red or black.   Abdominal pain.   A change in bowel habits, such as constipation or diarrhea.  How is this diagnosed?  This condition is diagnosed with a colonoscopy. This is a procedure in which a lighted, flexible scope is inserted into the anus and then passed into the colon to examine the area. Polyps are sometimes found when a colonoscopy is done as part of routine cancer screening tests.  How is this treated?  Treatment for this condition involves removing any polyps that are found. Most polyps can be removed during a colonoscopy. Those polyps will then be tested for cancer.  Additional treatment may be needed depending on the results of testing.  Follow these instructions at home:  Lifestyle   Maintain a healthy weight, or lose weight if recommended by your health care provider.   Exercise every day or as told by your health care provider.   Do not use any products that contain nicotine or tobacco, such as cigarettes and e-cigarettes. If you need help quitting, ask your health care provider.   If you drink alcohol, limit how much you have:  ? 0-1 drink a day for women.  ? 0-2 drinks a day for men.   Be aware of how much alcohol is in your drink. In the [place] one drink equals one 12 oz bottle of beer (355 mL), one 5 oz glass of wine (148 mL), or one 1 oz shot of hard liquor (44 mL).  Eating and drinking     Eat foods that are high in fiber, such as fruits, vegetables, and whole grains.   Eat foods that are high in calcium and vitamin D, such as milk, cheese, yogurt, eggs, liver, fish, and broccoli.   Limit foods that are high in fat, such as fried foods and desserts.   Limit the amount of red meat and processed meat you eat, such as hot dogs, sausage, bacon, and lunch meats.  General instructions   Keep all follow-up visits  as told by your health care provider. This is important.  ? This includes having regularly scheduled colonoscopies.  ? Talk to your health care provider about when you need a colonoscopy.  Contact a health care provider if:   You have new or worsening bleeding during a bowel movement.   You have new or increased blood in your stool.   You have a change in bowel habits.   You lose weight for no known reason.  Summary   Polyps are tissue growths inside the body. Polyps can grow in many places, including the colon.   Most colon polyps are noncancerous (benign), but some can become cancerous over time.   This condition is diagnosed with a colonoscopy.   Treatment for this condition involves removing any polyps that are found. Most polyps can be removed  during a colonoscopy.  This information is not intended to replace advice given to you by your health care provider. Make sure you discuss any questions you have with your health care provider.  Document Revised: 05/21/2017 Document Reviewed: 05/21/2017  Elsevier Patient Education  2021 [place]     When you return home, you may feel sleepy. Get plenty of rest for the remainder of the day.     If you received sedation for your procedure DO NOT DRIVE, OPERATE MACHINERY OR MAKE IMPORTANT DECISIONS for the reminder of the day.     It is normal after having a COLONOSCOPY to feel a little gassy and bloated, but if you develop SEVERE ABDOMINAL PAIN call your doctor immediately.     It is normal after having a COLONOSCOPY to see a small amount of blood after your first few bowel movements, but if you see a LARGE AMOUNT OF BRIGHT RED BLOOD, call your doctor immediately.     It is normal after having an UPPER ENDOSCOPY to develop a mild sore throat that will last a few days.     If you had a biopsy or Polypectomy you will need o hold aspirin or medications containing aspirin for  {NUMBERS 1-12:10} day/s.     If you had a biopsy or Polypectomy you will need o hold your Coumadin for {NUMBERS 1-12:10} day/s.     If you had a biopsy or Polypectomy you will need to hold any medication such as Advil, Motrin, Naproxin, Ibuprofen etc. for days.      Call your doctor immediately if you develop:   -  Chest Pain   -  Shortness of breath   -  Difficulty swallowing   -  Vomit bright red blood   -  Notice your bowel movements are black or maroon colored   -  You feel weak and tired     Call your physician for any unusual symptoms.     If for any reason you are unable to reach your doctor go to the nearest         [address].     SPECIFIC INSTRUCTIONS:     Follow-up appointment:    Prescription: {yes [id number]}   Date MD Name: Telephone:   11/21/2019 No att. providers found ***

## 2019-11-21 NOTE — Anesthesia Preprocedure Evaluation (Signed)
Pre-Anesthetic Note  .  [place] AN TELEVISIT:   Is this a televisit?: No          Patient: Alejandra Martin is a 49 year old female      Procedure Information     Date/Time: 11/21/19 1130    Scheduled providers: Lucio Edward, MD; Madelynn Done, MD    Procedure: COLONOSCOPY    Diagnosis:       Tubular adenoma of colon [D12.6]      Rectal bleeding [K62.5]    Location: [facility] - Operating Room          Relevant Problems   No relevant active problems       _0 @      Previous Anesthetic History:   Past Surgical History:  No date: CHOLECYSTECTOMY  No date: NO SIGNIFICANT SURGICAL HISTORY  No date: OB ANTEPARTUM CARE CESAREAN DLVR & POSTPARTUM    Current Medications:    naproxen (NAPROSYN) 500 MG tablet, Take 1 tablet by mouth every 12 (twelve) hours as needed as needed for pain. Take with food  for up to 15 days, Disp: 20 tablet, Rfl: 0  omeprazole (PRILOSEC) 40 MG capsule, Take 1 capsule by mouth daily, Disp: 30 capsule, Rfl: 2  atorvastatin (LIPITOR) 40 MG tablet, Take 1 tablet by mouth daily, Disp: 30 tablet, Rfl: 3  DULoxetine (CYMBALTA) 30 MG capsule, Take 1 each morning., Disp: 30 capsule, Rfl: 0  albuterol (PROVENTIL HFA,VENTOLIN HFA, PROAIR HFA) 108 (90 Base) MCG/ACT inhaler, Inhale 2 puffs into the lungs every 4 (four) hours as needed for Wheezing or Shortness of breath, Disp: 1 Inhaler, Rfl: 1    No current facility-administered medications for this encounter.      Home Medications  (Not in a hospital admission)      Allergies:   Review of Patient's Allergies indicates:   Erythromycin            Itching   Erythromycin            Itching   Macrolides and keto*    Itching, Rash    Smoking, Alcohol, Drugs:  Social History    Tobacco Use      Smoking status: Never Smoker      Smokeless tobacco: Never Used    Alcohol use: No      Drug use: No       PMHx:  Past Medical History:  06/12/2004: ACUTE GASTRITIS W/O HEMORRHAGE      Comment:  H. Pylori positive (done at [facility]); GI consult Dr.                Phillis Knack, TOC 10/01/04  03/26/2004: ALLERGY, UNSPECIFIED      Comment:  Seen by ENT, deviated septum, hypertrophy of inferior                nasal turbinates  No date: Anxiety  No date: Asthma  07/26/2004: CLOSTRIDIUM DIFFICILE      Comment:  GI cosult: Dr. Rollene Rotunda, TOC done on 10/01/04  No date: Elevated cholesterol      Comment:  resolved without meds  No date: Esophageal reflux  No date: Snores  No date: Stomach disease  No date: Unspecified asthma(493.90)  No date: Wears eyeglasses      Comment:  night driving    Vitals  BP 134/90    Pulse 105    Temp 97.5 F (36.4 C) (Temporal)    Resp 19    SpO2 97%  [place] ROAR ANES ROS/MED HX:   Patient summary    Nursing notes    neg     no chest pain   hypercholesterolemia   no shortness of breath  Physical Activity in METs (metabolic equivalents):  Greater than 4   asthma (2-3 years ago)  neg    neg     headaches   GERD (well controlled)  neg     Being w/u for hypothyroidism  neg    neg     BMI greater than 40 kg/m2  neg     anxiety   depression      Physical Exam    General     Level of consciousness:  Alert   Airway     Mallampati:  III    TM distance:  >3 FB    Mouth opening:  >3 FB    Neck ROM:  Full   Teeth  - normal exam  }   Heart   - normal exam     Lungs - normal exam               Pertinent Labs:   Lab Results   Component Value Date    NA 146 (H) 12/15/2017    K 4.4 12/15/2017    CREAT 0.8 12/15/2017    GLUCOSER 100 12/15/2017    WBC 9.1 10/28/2017    HCT 40.1 10/28/2017    PLTA 268 10/28/2017    PT 10.0 04/28/2008    APTT 27.5 04/28/2008    INR 1.0 (L) 04/28/2008         Anesthesia Plan    ASA Score:     ASA:  3    Airway:      Mallampati:  III    Mouth opening:  >3 FB    Neck ROM:  Full    TM distance:  >3 FB    Plan: MAC    Other information:     EKG Reviewed: : No      Full Stomach Precaution:: No      Post-Plan::  PACU    Informed Consent:     Anesthetic plan and risks discussed with:  Patient   Patient Consented        Attending Anesthesiologist  Statement:     Reassessed day of surgery: Yes        Assessment made, necessary equipment and appropriate plan in place.

## 2019-11-21 NOTE — Procedures (Deleted)
Sys, Conversion Provider Not In - 04/09/2013  Formatting of this note might be different from the original.  Exam Number:  [id number]                        Report Status:  Final  Type:  StressTest,Card,W/o Imaging  Date/Time:  03/10/2013 10:25  Exam Code:  [id number]  Ordering Provider:  Lovett Calender    HISTORY:         Chest pain other - Shortness of breath produced by exertion or stress - Hyperlipidemia      REPORT     Procedure : StressTest,Card,W/o Imaging       Referring MD: Lovett Calender MD       Diagnosis: Chest pain;          IHD RISKS:        Dyslipidemia; Obesity;       HISTORY:        Dyspnea; CP Atypical;       PURPOSE:         Chestpain; Diagnosis Ischemia;       MEDICATIONS:         Aspirin; Omeprazole; Albuterol; Flovent;         Zyrtec         PAIN SCREENING        Outpatient         no CP.   Pain does not prevent test.        Pain Assessmen t     Performed by: JC         Location: left arm    Intensity: 1          arm pain        Treatment          Pain Re-Assessment           PROTOCOL         The patient underwent a treadmill stress test using the Standard         Bruce protocol.         STRESS TEST RESULTS      Type: Standard Bruce         Total exercise time: 2 min 50 sec    Grade: 10%   Speed: 1.1mph            METS: 5           HR (Rest -> Max)     92 -> 169(95% predi cted)         BP (Initial -> Peak) 110/84 -> 142/80         Peak double product: 06004           Chest Pain: Did not occur;         End Point: Dyspnea; Fatigue; Leg fatigue;           Procedural Notes: O2 sat 98% at peak exercise.           Test supervised by: Lynett Fish MD         ECG         BASELINE:            NSR;         STRESS:            No ST segment changes.            No Arrhythmias.  Duke ETT Score = 3          Annual Cardiovascular Mortality Estimated from Duke Score = 1.02%         Risk Status based on AHCPR Publication No. 10-9831 May 1994:           Low = Annual Mortality < 1%            Intermediate = Annual Mortality 2 - 3%           High = Annual Mortality  4%         CONCLUSION         The patient has reduced exercise capacity (5 METS).  The ECG is         negative for ischemia.(Interpretation by Lynett Fish MD         Cardiology) .           I have personally reviewed the data relevant to the interpretation       of this study.         Lynett Fish MD      PROVIDERS:                           SIGNATURES:       Lily Lovings M.D.              Everlean Alstrom.D.

## 2019-11-21 NOTE — Procedures (Deleted)
System, Provider Not In, PhD - 11/30/2013  Formatting of this note might be different from the original.  Exam Number:  [id number]                        Report Status:  Final  Type:  Breast [employer]  Date/Time:  11/30/2013 10:54  Exam Code:  BIUSL/LEFT  Ordering Provider:  Marjory Lies  Associated Reports:     [id number]:  MammoBilatDigDx2D w CAD& Tomo  -------------------------------------------------------------- ----------------    HISTORY:         Breast swelling - Side: Bilateral - Breast swelling/asymmetry - Focal Pain - Focal Pain - Side: Left - 49 y/o F endorsing pain and several lumps in L breast, close to nipple, size of "beans"      REPORT     TECHNIQUE:       The following standard mammographic and tomosynthesis views were        obtained: bilateral craniocaudal and mediolateral oblique.         Computer-aided detection (R2 CENOVA vers ion 1.3) was utilized by the        radiologist in the interpretation of this examination.         BILATERAL MAMMOGRAM:       The present study is compared to previous studies.         The breasts are almost entirely fat.         No suspicious masses, calcifications or other abnormalities are seen.        There are no significant changes from the prior study.         BREAST ULTRASOUND       A focused ultrasound was performed.  No macroc yst or solid mass seen.         IMPRESSION:       There is no specific mammographic or sonographic evidence of        malignancy. In the absence of imaging findings any decision for        further intervention, at this time, should be based on the clinical        assessment.         BI-RADS Category 1: Negative        PROVIDERS:                           SIGNATURES:       Stacie Acres M.D.                Reynders, Sheppard Evens.D.

## 2019-11-21 NOTE — Anesthesia Postprocedure Evaluation (Signed)
Anesthesia Post-Operative Evaluation Note    Patient: Alejandra Martin           Procedure Summary     Date: 11/21/19 Room / Location: [facility] - Operating Room    Anesthesia Start: 1353 Anesthesia Stop: 1740    Procedure: COLONOSCOPY Diagnosis:       Tubular adenoma of colon      Rectal bleeding    Scheduled Providers: Lucio Edward, MD; Madelynn Done, MD Responsible Provider: Barton Dubois, MD    Anesthesia Type: MAC ASA Status: 3            POST-OPERATIVE EVALUATION    Anesthesia Post Evaluation    Vitals signs in patient's normal range: Yes  Respiratory function stable; airway patent: Yes  Cardiovascular function stable: Yes  Hydration status stable: Yes  Mental status recovered; patient participates in evaluation and/or is at baseline: Yes  Pain control satisfactory: Yes  Nausea and vomiting control satisfactory: Yes    Procedure was labor & delivery no  PostOP disposition PACU  Anesthesia Observation no significant observation      Last vitals  Vitals Value Taken Time   BP 113/82 11/21/19 1420   Temp 97.6 F (36.4 C) 11/21/19 1420   Pulse 83 11/21/19 1420   Resp 22 11/21/19 1420   SpO2 98 % 11/21/19 1420

## 2019-11-21 NOTE — H&P (Signed)
This 49 year old year old Alejandra Martin speaking patient presents for consideration of a surveillance colonoscopy. She also has new onset hematochezia. The patient abd cramping, rectal pressure when she sits down and occasional blood in her stool    The patient's last colonoscopy was on 12/15/2017. She had a 8 mm adenomatous polyp found and removed.     The patient recently complained of abdominal cramping and rectal pressure when she sits down and occasional has blood in her stool.     The patient denied any other major symptoms.  She has had no melena, abdominal pain or weight loss. She has no family history of colon cancer.      Past Medical History:  06/12/2004: ACUTE GASTRITIS W/O HEMORRHAGE      Comment:  H. Pylori positive (done at [facility]); GI consult Dr.               Phillis Knack, TOC 10/01/04  03/26/2004: ALLERGY, UNSPECIFIED      Comment:  Seen by ENT, deviated septum, hypertrophy of inferior                nasal turbinates  No date: Anxiety  No date: Asthma  07/26/2004: CLOSTRIDIUM DIFFICILE      Comment:  GI cosult: Dr. Rollene Rotunda, TOC done on 10/01/04  No date: Elevated cholesterol      Comment:  resolved without meds  No date: Esophageal reflux  No date: Snores  No date: Stomach disease  No date: Unspecified asthma(493.90)  No date: Wears eyeglasses      Comment:  night driving      Review of Patient's Allergies indicates:   Erythromycin            Itching   Erythromycin            Itching   Macrolides and keto*    Itching, Rash       Social History     Socioeconomic History    Marital status: Married     Spouse name: Not on file    Number of children: Not on file    Years of education: Not on file    Highest education level: Not on file   Occupational History    Not on file   Tobacco Use    Smoking status: Never Smoker    Smokeless tobacco: Never Used   Substance and Sexual Activity    Alcohol use: No    Drug use: No    Sexual activity: Yes     Partners: Male     Comment: men age 2 q 4-6 wks x 3d,  small fibroid on u/s, 1st SA age 37, 2 lifetime partners, current x 8 yrs   Other Topics Concern    Not on file   Social History Narrative    07/09/2005    [name] family [place] age 26, husband from [place]    Works FT - [employer]    Pt thrilled with pregnancy-seeking pregnancy x 4 yrs, very anxious since sab 2 yrs ago    Has one child    Not working now    Lives with husband and daughter    Alton Revere, MD, 05/13/2016, 1:50 PM   Social Determinants of Health  Financial Resource Strain:     Difficulty of Paying Living Expenses: Not on file  Food Insecurity:     Worried About Running Out of Food in the Last Year: Not  on file    [place] in the Last Year: Not on file  Transportation Needs:     Lack of Transportation (Medical): Not on file    Lack of Transportation (Non-Medical): Not on file  Physical Activity:     Days of Exercise per Week: Not on file    Minutes of Exercise per Session: Not on file  Stress:     Feeling of Stress : Not on file  Social Connections:     Frequency of Communication with Friends and Family: Not on file    Frequency of Social Gatherings with Friends and Family: Not on file    Attends Religious Services: Not on file    Active Member of Clubs or Organizations: Not on file    Attends Archivist Meetings: Not on file    Marital Status: Not on file  Intimate Partner Violence:     Fear of Current or Ex-Partner: Not on file    Emotionally Abused: Not on file    Physically Abused: Not on file    Sexually Abused: Not on file      Current Outpatient Medications   Medication Sig    naproxen (NAPROSYN) 500 MG tablet Take 1 tablet by mouth every 12 (twelve) hours as needed as needed for pain. Take with food  for up to 15 days    omeprazole (PRILOSEC) 40 MG capsule Take 1 capsule by mouth daily    atorvastatin (LIPITOR) 40 MG tablet Take 1 tablet by mouth daily    DULoxetine (CYMBALTA) 30 MG  capsule Take 1 each morning.    albuterol (PROVENTIL HFA,VENTOLIN HFA, PROAIR HFA) 108 (90 Base) MCG/ACT inhaler Inhale 2 puffs into the lungs every 4 (four) hours as needed for Wheezing or Shortness of breath     No current facility-administered medications for this encounter.         Review of patient's family history indicates:  Problem: OTHER      Relation: Father          Age of Onset: (Not Specified)          Comment: leukemia   Problem: Hypertension      Relation: Father          Age of Onset: (Not Specified)  Problem: Lipids      Relation: Mother          Age of Onset: (Not Specified)  Problem: OTHER      Relation: Mother          Age of Onset: (Not Specified)          Comment: ESRD on HD  Problem: Diabetes      Relation: Mother          Age of Onset: (Not Specified)  Problem: Hypertension      Relation: Mother          Age of Onset: (Not Specified)  Problem: Renal      Relation: Mother          Age of Onset: (Not Specified)          Comment: on dialysis  Problem: Hypertension      Relation: Brother          Age of Onset: (Not Specified)        REVIEW OF SYSTEMS:    Cardiovascular:  No chest pain, palpitations, MI or heart failure. She has hyperlipidemia  Pulmonary:  No pneumonia, TB.   She has asthma.  Neuro:  No stroke, seizure or loss of consciousness.    Endocrine:  No thyroid disease.  Has prediabetes   Psych: Depressive disorder with anxiety    Physical Exam:  Vital Signs: BP 134/90    Pulse 105    Temp 97.5 F (36.4 C) (Temporal)    Resp 19    SpO2 97%   General: Obese body habitus.  Pulmonary: Clear to auscultation and percussion. No rales, wheezes or rhonchi.  Cardiovascular: S1, S2, no S3, S4, clicks, rubs or murmurs.  JVD not elevated with patient sitting.  Abdominal: Schapoid abdomen. Normal bowel sounds. No tenderness on light or deep palpation. No hepatomegaly or splenomegaly.  Neuro: Alert and oriented x 3, nonfocal    ASA II    Impression:  Hemaotchezia  Surveillance colonoscopy    Medical  Decision Making:  The patient will have a colonoscopy done. The patient was informed of the risk of bleeding, discomfort, perforation, a need for surgery, infection, drug reaction, cardiovascular and cerebrovascular compromise and the possibility of missing a lesion or problem.  She understood these risks and consented to the exam.     All questions were answered. The patient is in agreement with our plan.

## 2019-11-21 NOTE — Procedures (Deleted)
Sys, Conversion Provider Not In - 06/29/2013  Formatting of this note might be different from the original.  Exam Number:  [id number]                        Report Status:  Final  Type:  Chest 2 Views  Date/Time:  05/26/2011 13:04  Exam Code:  JTTS1  Ordering Provider:  Oneita Kras MD    HISTORY:         Cough -       REPORT     Addendum Begins       These findings were discussed with Oneita Kras, MD at the time        of inter pretation, 05/26/2011 1:45 PM, who responded indicating that        the communication was understood.       Addendum Ends       Frontal and lateral views of the chest.         COMPARISON: 2 View Chest 05/15/2003.         FINDINGS:       Lines/tubes:  None.         Lungs: A subtle focus of patchy opacities present at the left lung        base consistent with pneumonia or atelectasis. The remainder of the        lungs are clear.       Pleura :  There is no pleural effusion or pneumothorax.         Heart and mediastinum:  The heart and the mediastinum are normal.         Bones:  The thoracic skeleton is unremarkable.         IMPRESSION:        Subtle left basilar opacity consistent with pneumonia.         Because findings in this report may be important, an automated        tracked email will be sent to the referring physician upon report        finalization by the attending  Radiologist        PROVIDERS:                           SIGNATURES:       Kateri Mc MD                    Kateri Mc MD

## 2019-11-21 NOTE — Procedures (Deleted)
System, Provider Not In, PhD - 01/20/2014  Formatting of this note might be different from the original.  Exam Number:  [id number]                        Report Status:  Final  Type:  Chest 2 Views  Date/Time:  01/20/2014 10:04  Exam Code:  [id number]  Ordering Provider:  Oneita Kras MD    HISTORY:         Cough - cough and productive cough x 2 wks + chest apin with cough, ? PNA      REPORT     Frontal and lateral views of the chest.          COMPARISON: 2 View Chest 11/24/2011.         FINDINGS:       Lines/tubes:  None.         Lungs:  There is faint patchy opacity in the right lower lobe        anterior segment that is new consistent with a pneumonia.         Pleura:  There is no pleural effusion or pneumothorax.         Heart and mediastinum:  The heart and the mediastinum are normal.         Bones:  The thoracic skeleton is unremarkable.         IMPRESSION:         New right lower lobe pneumonia.         RECOMMENDATION:         Repeat chest radiograph in 6-8 weeks to assess for complete clearing.               The critical findings in this report were reported to the referring        clinician, Oneita Kras, who responded indicating that the        communication was understood. Contact was made at the time of        interpretation on Fri, Jan 20 2014 at 11:05 AM, within _10__ minutes        of  observation.      PROVIDERS:                           SIGNATURES:       Mardelle Matte MD            Mardelle Matte MD

## 2019-11-21 NOTE — Procedures (Deleted)
Alejandra Fish, MD - 05/21/2013  Formatting of this note might be different from the original.  Report Number:  [id number]                              Report Status: Final  Type:  Cardiac Ultrasound - TTE  Date:  10/01/2010 16:03  Ordering Provider:  Orie Rout     ANATOMIC REGION   STATUS ROUTINE DIMENSIONS       REGIONAL WALL MOTION                                             (normal)   (completed only if abn.)    MITRAL  VALVE      ABN                            SEGMENT   BASE  MID  APEX   LEFT ATRIUM       NORM   LA      29  (25-52mm)                               AV. A0. LVOT      NORM   AOSinus 29  (24-75mm)   anterior                    LEFT VENT.        NORM   ASC AO  29  (<23mm)     ant.sep.                    TV. RA. VC.       ABN    LVIDd   36  (37-24mm)   mid.sep.                    PV. INF. PA.      NORM   LVIDs   18              inf.sep.                     RIGHT VENT.       NORM   PWT     12  ( 7-72mm)   inferior                    IAS. IVS.         NORM   IVS     11  ( 7-62mm)   inf.pos.                    COMPLEX CHD              EF    75 % ( >50 %)     pos.lat.                    CORONARIES                                       lateral  PERICARDIUM       NORM                                                         Wall Motion Abbreviations: N=Normal H =Hypokinetic A=Akinetic D=Dyskinetic      RefMD Sakmar MD, Idelia Salm .                                             Reason Dyspnea;                                                              Tape Color:         Number:      Footage: -             Study Quality: 3     Echo Machine:  [facility] #33 ([id number])                                              CareFacility:  [place]                                                         Color Flow     Doppler    TTE                                                   MITRAL VALVE                                                                 There is no evidence of mitral valve  prolapse.  There is mild to moderate  mitral regurgitation by color and spectral Doppler.  Most of the jet of MR is  directed posteriorly along the left atrial free-wall.    LEFT ATRIUM                                                                  There i s no evidence of left atrial dilatation.  The pulmonary venous doppler  velocity profile is consistent with normal LA pressure.    AORTIC  VALVE, AORTA, LVOT                                                    There is no evidence of valvular aortic stenosis.  The aortic valve appears to  be tricuspid.  There is no evidence of aortic insufficiency by color or  spectral Doppler.  The aortic diameter at the sinuses of Valsalva is 29 mm  (no rmal: less than 51mm).  The ascending aorta diameter above the sinotubular  junction is 29 mm (normal: less than or equal to 3mm).    LEFT VENTRICLE                                                               The left ventricular cavity size is normal.  The left ventricular systolic  function is within normal limits.  There are no obvious segmental wall motion  abnormalities.  The estimated ejection fraction is 75 %.  LVEF was measur ed by  the single dimension method.    TRICUSPID VALVE - IVC, SVC                                                   There is color and spectral Doppler evidence of mild to moderate tricuspid  insufficiency.    PULMONARY VALVE, INFUND., P.A.                                               There is evidence of trace pulmonary insufficiency by color and spectral  Doppler.  The RV systolic pressure was estimated from the regurgitant tricuspid   velocity  (assuming an RA pressure of 78mmHg).  The estimated RV systolic  pressure is 38 mmHg.    RIGHT VENTRICLE                                                              The right ventricle is not dilated.  The right ventricular systolic function is  within normal limits.    PERICARDIAL DISEASE AND EXTRACARDIAC MASSES                                  There  is no evidence of pericardial effusion.    CONCLUSIONS  LVEF 75% with mild to moderate MR and TR with normal estimated PA pressure.    Performed by Lurlean Horns, RDCS.  Interpreted by Alejandra Martin, M.D.  These data were supervised by me and I agree with the above: Alejandra Martin,  M.D.

## 2019-11-21 NOTE — Procedures (Deleted)
System, Provider Not In, PhD - 03/10/2014  Formatting of this note might be different from the original.  Exam Number:  [id number]                        Report Status:  Final  Type:  Chest 2 Views  Date/Time:  03/10/2014 12:44  Exam Code:  [id number]  Ordering Provider:  Marjory Lies    HISTORY:         Cough - Fever - Pneumonia - Abnormal chest X-Ray - 49 y/o F recently diagnosed w/RLL pneumonia, please eval for resolution      REP ORT     Frontal and lateral views of the chest.         COMPARISON: 2 View Chest 01/20/2014, 2 View Chest 11/24/2011, 2 View        Chest 05/26/2011.         FINDINGS:       Lines/tubes:  None.         Lungs:  There is opacity in the anterior segment of the lower lobes        on the lateral view. This is decreased but incompletely resolved        since January 20, 2014 examination. This remains increased over the        baseline examinat ions of October 2013 and April 2013. No evidence for        pulmonary edema.         Pleura:  Pleural spaces are clear.         Heart and mediastinum:  The cardiac silhouette is within normal        limits in size. No radiographic evidence for hilar or mediastinal        lymphadenopathy.         Bones:  Bony thorax and extrathoracic soft tissues are unchanged.         IMPRESSION:          Opacity in region of the anterior segment of the  right lower lobe on        lateral view. This is decreased but incompletely resolved since        January 20, 2014.         Otherwise clear lungs.         RECOMMENDATION: Follow-up PA and lateral views of the chest are        recommended in 6-T12 weeks (or as clinically indicated) to ensure        complete clearing.          This report has been forwarded to an automated communication system        which will electronically notify appr opriate providers of potentially        important findings.      PROVIDERS:                           SIGNATURES:       Alphia Moh MD                Alphia Moh MD

## 2019-11-21 NOTE — Procedures (Deleted)
Sys, Conversion Provider Not In - 06/28/2013  Formatting of this note might be different from the original.  Exam Number:  [id number]                        Report Status:  Final  Type:  Mammo Bilat Dig Screen w CAD  Date/Time:  10/31/2011 15:40  Exam Code:  [id number]  Ordering Provider:  Lovett Calender    HISTORY:          - Annual -      REPORT     BILATERAL MAMMOGRAM         The present study is compared to previous films.  Computer -aided        detection (R2 CENOVA version 1.3) was utilized by the radiologist in        the interpretation of this examination.         The breasts are almost entirely fat.         No masses, areas of architectural distortion or suspicious        microcalcifications are evident.  There has been no significant        interval change since the prior examination.         IMPRESSION:         No mammographic evidence of malignancy.          BI-RADS Category 1 Negative         Patient's information is entered into a reminder system with a target        due date for the next mammogram.        PROVIDERS:                           SIGNATURES:       Rudean Hitt MD              Rudean Hitt MD

## 2019-11-21 NOTE — PROVATION-GI (Signed)
[facility]  Patient Name: Alejandra Martin  MRN: [id number]  CSN: [id number]  Date of Birth: 07-Nov-1970  Admit Type: Outpatient  Age: 49  Gender: Female  Note Status: Finalized  Patient Location: OR O  Referring MD:          Janann August  Procedure Date:        11/21/2019 1:49:02 PM  Procedure:             Colonoscopy  Endoscopist:           Lucio Edward MD, MD  Indications for Procedure:       Hematochezia  Medications:           Monitored Anesthesia Care  Procedure:       Pre-Anesthesia Assessment:       - Prior to the procedure, a History and Physical was performed, and patient        medications and allergies were reviewed. The patient is competent. The risks        and benefits of the procedure and the sedation options and risks were        discussed with the patient. All questions were answered and informed consent        was obtained. Patient identification and proposed procedure were verified by        the physician, the nurse, the anesthesiologist and the technician in the        procedure room. Mental Status Examination: alert and oriented. Airway        Examination: normal oropharyngeal airway and neck mobility. Respiratory        Examination: clear to auscultation. CV Examination: normal. Prophylactic        Antibiotics: The patient does not require prophylactic antibiotics. Prior        Anticoagulants: The patient has taken no previous anticoagulant or        antiplatelet agents. ASA Grade Assessment: II - A patient with mild systemic        disease. After reviewing the risks and benefits, the patient was deemed in        satisfactory condition to undergo the procedure. The anesthesia plan was to        use monitored anesthesia care (MAC). Immediately prior to administration of        medications, the patient was re-assessed for adequacy to receive sedatives.        The heart rate, respiratory rate, oxygen saturations, blood pressure,        adequacy of pulmonary ventilation, and response to  care were monitored        throughout the procedure. The physical status of the patient was re-assessed        after the procedure.       Just prior to the procedure, an updated history and physical was done. I        obtained an informed consent from the patient reviewing the risk of the        procedure including (but not limited to) respiratory depression, perforation,        bleeding, discomfort, a possible need for surgery and unexpected reactions to        medications. The patient is aware that test has limitations and may not        detect significant lesions such as cancer or other potential diseases. The        patient was also informed that they might need a repeat colonoscopy earlier  than standard guidelines if there are changes in their symptoms or concerning        findings noted. A time out was performed with the entire procedure staff        present. The scope was passed under direct vision. Throughout the procedure,        the patient's blood pressure, pulse, and oxygen saturations were monitored        continuously. The [id number] was introduced through the anus and        advanced to 6 cm into the ileum. The colonoscopy was performed without        difficulty. The patient tolerated the procedure well. The quality of the        bowel preparation was good. Scope withdrawal time was 12 minutes. The total        duration of the procedure was 16 minutes.  Findings:       The perianal and digital rectal examinations were normal. Pertinent negatives        include normal sphincter tone.       The terminal ileum appeared normal. This was biopsied with a cold forceps for        histology and Rule out IBD. Estimated blood loss was minimal.       The ascending colon appeared normal. This was biopsied with a cold forceps        for histology and Rule out IBD and microscopic colitis. Estimated blood loss        was minimal.       A 3 mm polyp was found in the transverse colon. The polyp was  sessile. The        polyp was removed with a jumbo cold forceps. Resection and retrieval were        complete. Estimated blood loss was minimal.       A 3 mm polyp was found in the rectum. The polyp was sessile. The polyp was        removed with a jumbo cold forceps. Resection and retrieval were complete.        Estimated blood loss was minimal.       No additional abnormalities were found on retroflexion.       The exam was otherwise without abnormality.  Post Procedure Diagnosis:       - The examined portion of the ileum was normal. Biopsied.       - The ascending colon is normal. Biopsied.       - One 3 mm polyp in the transverse colon, removed with a jumbo cold forceps.        Resected and retrieved.       - One 3 mm polyp in the rectum, removed with a jumbo cold forceps. Resected        and retrieved.       - The examination was otherwise normal.  Complications:         No immediate complications. Estimated blood loss: Minimal.  Estimated Blood Loss:  Estimated blood loss was minimal.  Recommendation:       - Discharge patient to home (ambulatory).       - Await pathology results.       - No aspirin, ibuprofen, naproxen, or other non-steroidal anti-inflammatory        drugs for 3 days after polyp removal.       - Return to my office in 6 months.  Lucio Edward MD, MD  11/21/2019 2:28:36 PM  This report has been signed electronically.  Number of Addenda: 0  Note Initiated On: 11/21/2019 1:49 PM

## 2019-11-21 NOTE — Procedures (Deleted)
Sippo, Colan Neptune, MD - 12/05/2014  Formatting of this note might be different from the original.  INDICATION FOR EXAM:  Screening.    PROCEDURE:  The following standard mammographic and tomosynthesis views were obtained: craniocaudal and mediolateral oblique. Computer-aided detection (R2 CENOVA version 1.3) was utilized by the radiologist in the interpretation of this examination.    BILATERAL MAMMOGRAM:  The present examination has bee n compared to prior imaging studies.  The breasts are almost entirely fat.    No suspicious masses, calcifications or other abnormalities are seen.  There are no significant changes compared to prior studies.    IMPRESSION:   IMPRESSION:  There is no specific mammographic evidence of malignancy.    BI-RADS Category 1: Negative    Patient's information is entered into a reminder system with a target due date for the next mammogram.

## 2019-11-21 NOTE — Procedures (Deleted)
System, Provider Not In, PhD - 11/30/2013  Formatting of this note might be different from the original.  Exam Number:  [id number]                        Report Status:  Final  Type:  MammoBilatDigDx2D w CAD& Tomo  Date/Time:  11/30/2013 09:46  Exam Code:  [id number]  Ordering Provider:  Marjory Lies  Associated Reports:       [id number]:  [employer]ast [employer]  --------------------------------------------------------------- ---------------    HISTORY:         49 y/o F endorsing pain and several lumps in L breast, close to nipple, size of "beans" - 49 y/o F endorsing pain and several lumps in L breast, close to nipple, size of "beans"      REPORT     TECHNIQUE:       The following standard mammographic and tomosynthesis views were        obtained: bilateral craniocaudal and mediolateral oblique.         Computer-aided detection (R2 CENOVA version 1.3) was u tilized by the        radiologist in the interpretation of this examination.         BILATERAL MAMMOGRAM:       The present study is compared to previous studies.         The breasts are almost entirely fat.         No suspicious masses, calcifications or other abnormalities are seen.        There are no significant changes from the prior study.         BREAST ULTRASOUND       A focused ultrasound was performed.  No macrocyst or solid m ass seen.         IMPRESSION:       There is no specific mammographic or sonographic evidence of        malignancy. In the absence of imaging findings any decision for        further intervention, at this time, should be based on the clinical        assessment.         BI-RADS Category 1: Negative        PROVIDERS:                           SIGNATURES:       Stacie Acres M.D.                Reynders, Sheppard Evens.D.

## 2019-11-21 NOTE — Procedures (Deleted)
Micah Flesher, MD - 06/02/2014  Formatting of this note might be different from the original.  Frontal and lateral views of the chest.    COMPARISON: 2 View Chest 03/10/2014, 2 View Chest 01/20/2014.    FINDINGS:  Lines/tubes:  None.    Lungs:  The lungs are well inflated. Opacities seen on 01/20/2014 have resolved..  There is no evidence of pneumonia or pulmonary edema.    Pleura:  There is no pleural effusion or pneumothorax.    He art and mediastinum:  The heart and the mediastinum are unchanged    Bones:  The thoracic skeleton is unchanged    IMPRESSION:   IMPRESSION:   No evidence of pneumonia on the current exam. Opacities seen on 01/20/2014 have  resolved.

## 2019-11-21 NOTE — Procedures (Deleted)
Sys, Conversion Provider Not In - 04/10/2013  Formatting of this note might be different from the original.  Exam Number:  [id number]                        Report Status:  Final  Type:  Mammo BilDig Scrn w CAD &Tomo  Date/Time:  12/16/2012 13:54  Exam Code:  [id number]  Ordering Provider:  Lovett Calender    HISTORY:          - Annual - routine      REPORT     TECHNIQUE:         Both standard mammographic and tomosynthesis images were  obtained and        reviewed as part of this examination.         BILATERAL MAMMOGRAM         The present study is compared to previous studies.  Computer-aided        detection (R2 CENOVA version 1.3) was utilized by the radiologist in        the interpretation of this examination.         The breasts are almost entirely fat.         No masses, areas of architectural distortion or suspicious        microcalcifications are evident.  The re are no significant changes        from the prior study.         IMPRESSION:         There is no specific mammographic evidence of malignancy.         BI-RADS Category 1 Negative         Patient's information is entered into a reminder system with a target        due date for the next mammogram.        PROVIDERS:                           SIGNATURES:       Elpidio Eric MD                       Elpidio Eric MD

## 2019-11-21 NOTE — Procedures (Deleted)
Sys, Conversion Provider Not In - 06/28/2013  Formatting of this note might be different from the original.  Exam Number:  [id number]                        Report Status:  Final  Type:  Chest 2 Views  Date/Time:  11/24/2011 13:02  Exam Code:  [id number]  Ordering Provider:  Orie Rout MD    HISTORY:         Chest pain - Malaise and fatigue - Hx/o LLL Pna now with L sided CP and similar sx      REPORT     Frontal and lateral views o f the chest.         COMPARISON: 05/26/2011         FINDINGS:       Lines/tubes:  None.         Lungs:  The lungs are well inflated and clear. There is no evidence        of pneumonia or pulmonary edema.         Pleura:  There is no pleural effusion or pneumothorax.         Heart and mediastinum:  The heart and the mediastinum are stable.         Bones:  The thoracic skeleton is unchanged.         IMPRESSION:        Clear lungs.         T hese findings were discussed with Orie Rout, MD on        11/24/2011 1:16 PM, who responded indicating that the communication        was understood.      PROVIDERS:                           SIGNATURES:       Misono, Redgie Grayer MD                         Kateri Mc MD                    Kateri Mc MD

## 2019-11-22 LAB — SURGICAL PATH SPECIMEN GASTROINTESTINAL

## 2019-12-24 ENCOUNTER — Encounter (HOSPITAL_BASED_OUTPATIENT_CLINIC_OR_DEPARTMENT_OTHER): Payer: Self-pay | Admitting: Gastroenterology

## 2020-02-28 ENCOUNTER — Ambulatory Visit (HOSPITAL_BASED_OUTPATIENT_CLINIC_OR_DEPARTMENT_OTHER): Payer: No Typology Code available for payment source | Admitting: Gastroenterology

## 2020-04-04 IMAGING — MR ABD PELVE
20 of 49 series · 20 of 49 positions shown · non-contrast
Comparison: none

[Series 5: T2 · sagittal · 3.5mm · 0.47mm/px · 1 of 34 slices shown (1 of 3)]
[im 1/34]
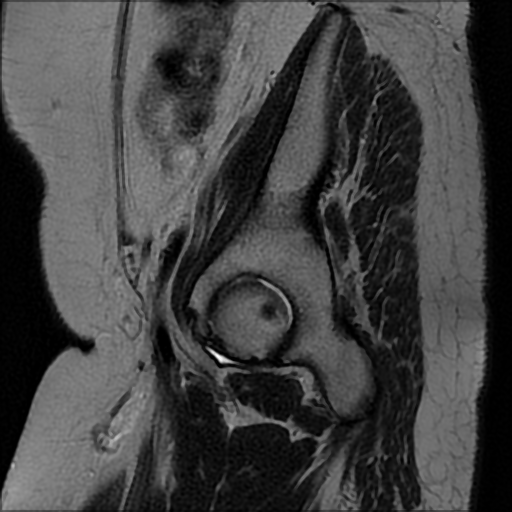

[Series 7: T2 · axial · 3.5mm · 0.47mm/px · 1 of 43 slices shown (2 of 3)]
[im 1/43]
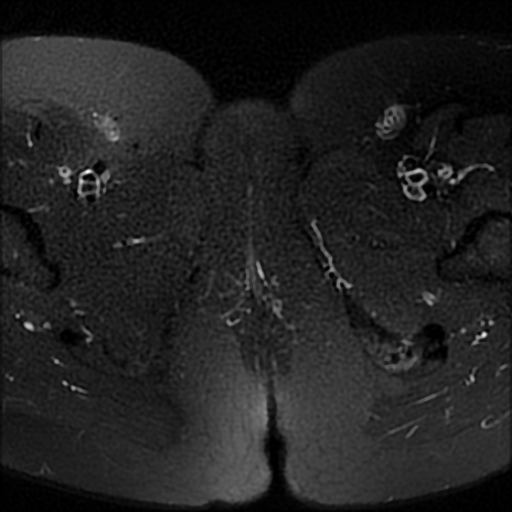

[Series 10: (person_name)_34 · axial · 15.0mm · 7.50mm/px · 1 of 128 slices shown]
[im 1/128]
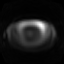

[Series 12: (person_name)_23 · axial · 15.0mm · 7.50mm/px · 1 of 128 slices shown]
[im 1/128]
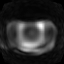

[Series 15: T2 · axial · 6.0mm · 0.86mm/px · 1 of 38 slices shown (3 of 3)]
[im 1/38]
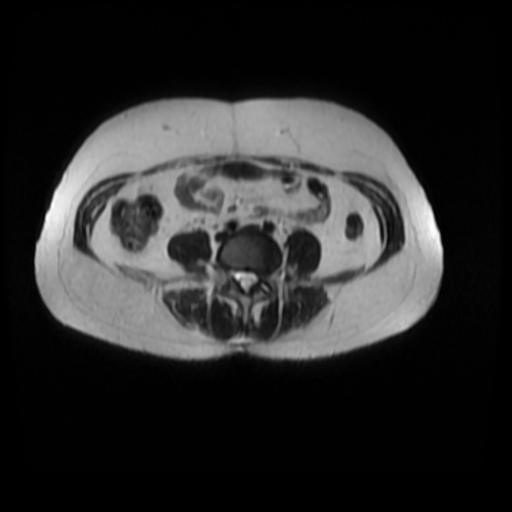

[Series 18: ax 2d dual · axial · 6.0mm · 0.86mm/px · 1 of 76 slices shown]
[im 1/76]
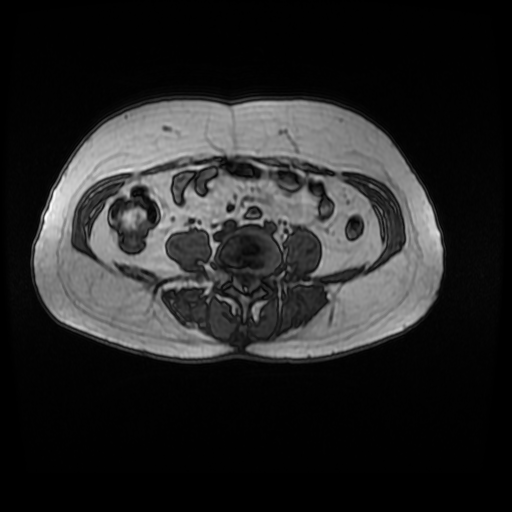

[Series 21: T1 dynamic · coronal · 4.2mm · 0.86mm/px · 1 of 54 slices shown (1 of 11)]
[im 1/54]
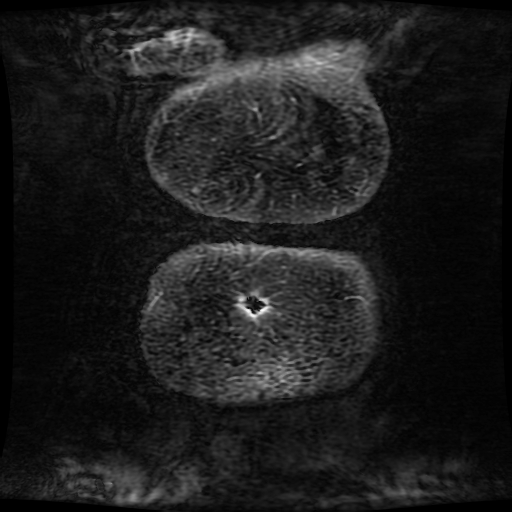

[Series 24: T1 dynamic · sagittal · 3.6mm · 0.59mm/px · 1 of 112 slices shown (2 of 11)]
[im 1/112]
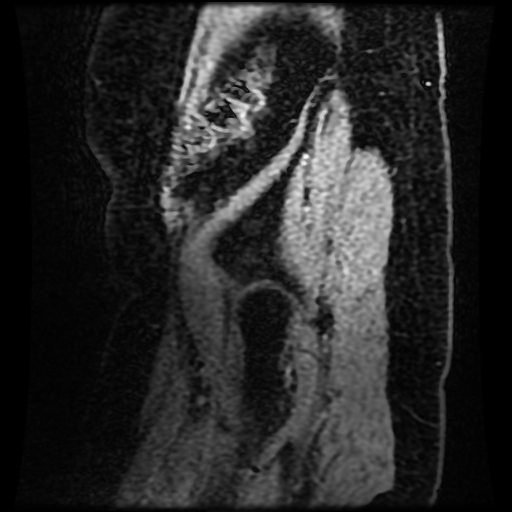

[Series 400: multiplanar reconstruction (mpr) · coronal · 7.0mm · 0.66mm/px · 1 of 20 slices shown]
[im 1/20]
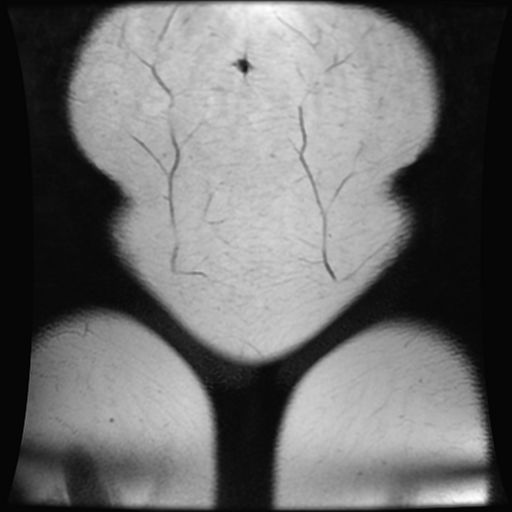

[Series 900: T1 dynamic · axial · non-contrast · 3.6mm · 0.59mm/px · 1 of 112 slices shown (3 of 11)]
[im 1/112]
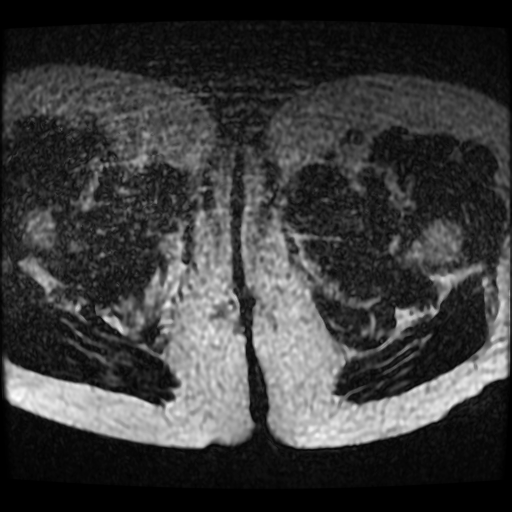

[Series 902: T1 dynamic · axial · non-contrast · 3.6mm · 0.59mm/px · 1 of 112 slices shown (4 of 11)]
[im 1/112]
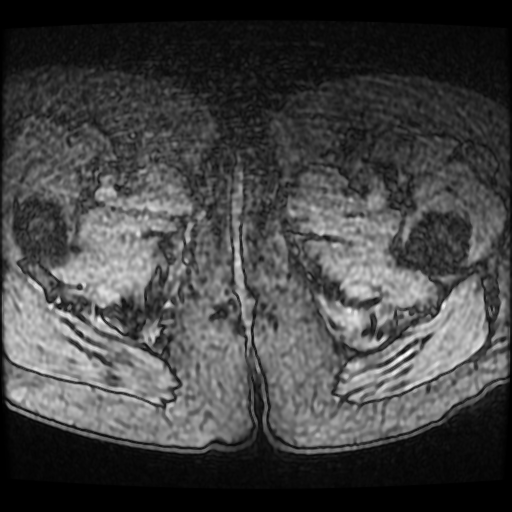

[Series 2001: T1 dynamic · axial · 4.6mm · 0.86mm/px · 1 of 112 slices shown (5 of 11)]
[im 1/112]
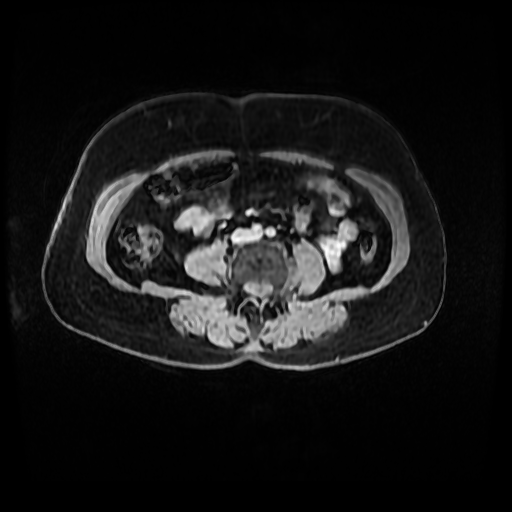

[Series 2003: T1 dynamic · axial · 4.6mm · 0.86mm/px · 1 of 112 slices shown (6 of 11)]
[im 1/112]
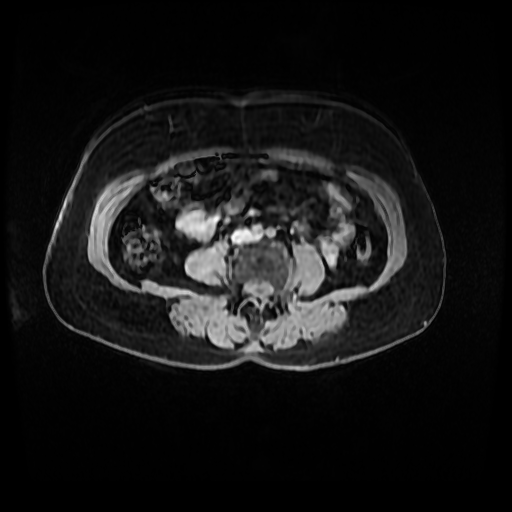

[Series 2051: T1 dynamic · axial · 4.6mm · 0.86mm/px · 1 of 112 slices shown (7 of 11)]
[im 1/112]
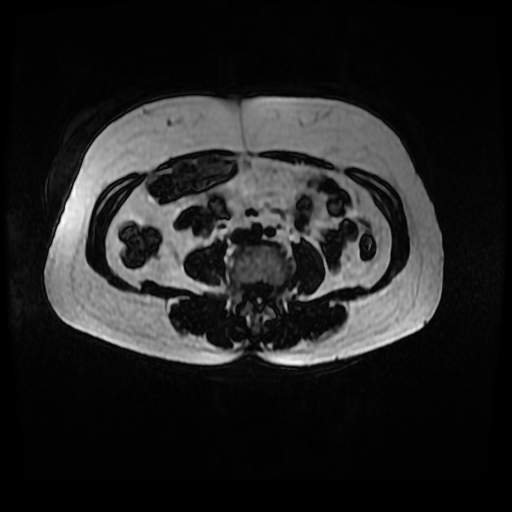

[Series 2053: T1 dynamic · axial · 4.6mm · 0.86mm/px · 1 of 112 slices shown (8 of 11)]
[im 1/112]
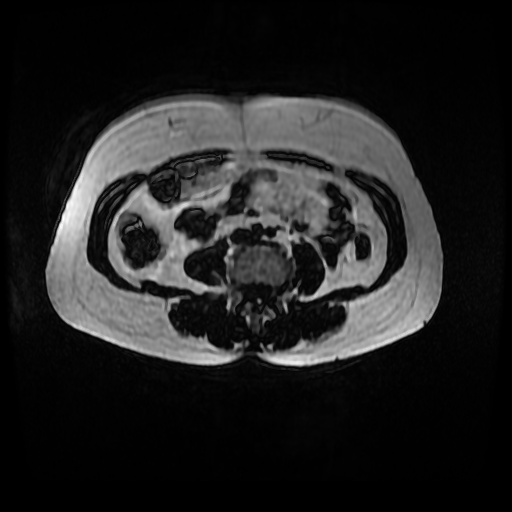

[Series 2200: T1 dynamic · axial · 4.6mm · 0.86mm/px · 1 of 56 slices shown (9 of 11)]
[im 1/56]
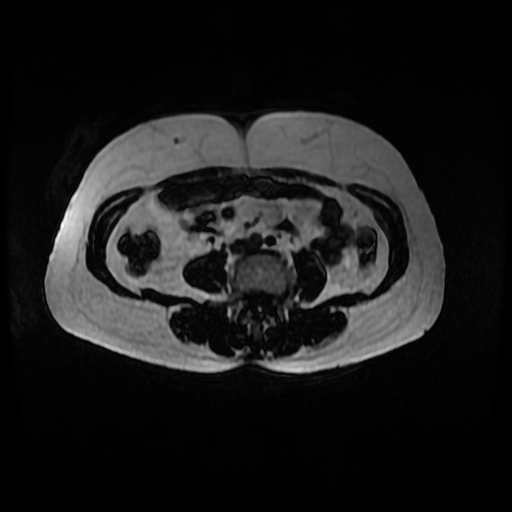

[Series 2500: T1 dynamic · axial · 3.6mm · 0.64mm/px · 1 of 112 slices shown (10 of 11)]
[im 1/112]
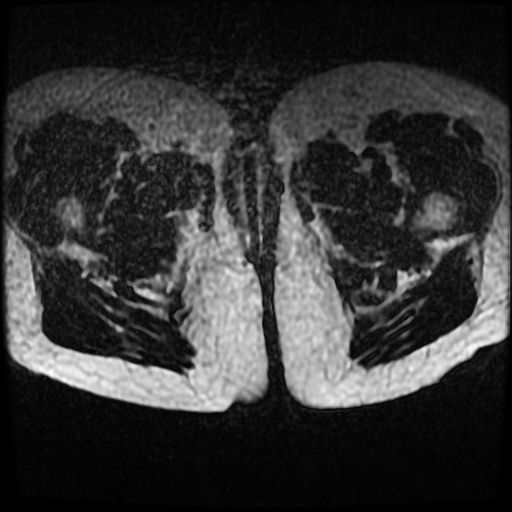

[Series 2502: T1 dynamic · axial · 3.6mm · 0.64mm/px · 1 of 112 slices shown (11 of 11)]
[im 1/112]
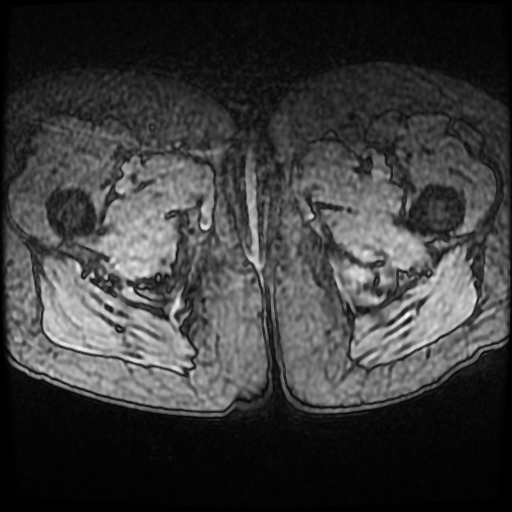

[((date))-((date)) · axial · 4.6mm · 0.86mm/px · 1 of 112 slices shown (1 of 2)]
[im 1/112]
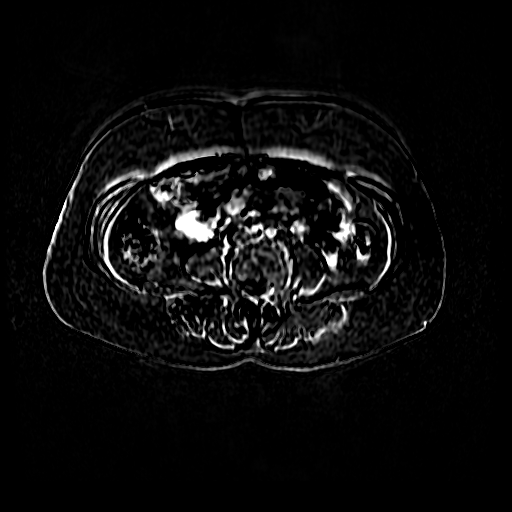

[((date))-((date)) · axial · 4.6mm · 0.86mm/px · 1 of 112 slices shown (2 of 2)]
[im 1/112]
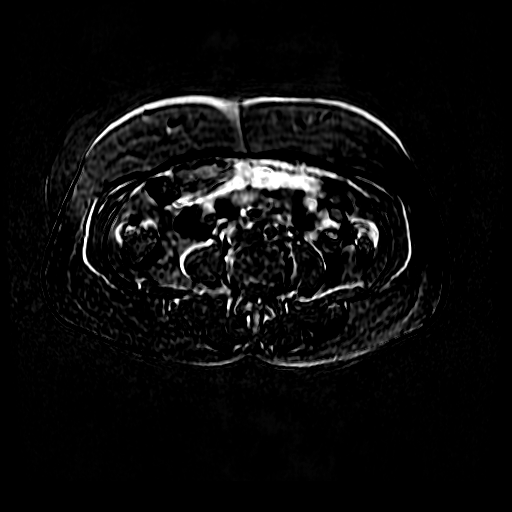

[20 of 49 positions shown; findings below may reference images not displayed]

TÉCNICA:  
Exame  realizado  pelas  técnicas  TSE  e  GRE  com  imagens  multiplanares  ponderadas  em  T1  e  T2,  antes  e 
após a administração intravenosa do meio de contraste paramagnético (gadolínio).
ANÁLISE:
Realizada  análise  comparativa  com  exame  anterior  de  TC  do  abdome  e  pelve  de  15/12/2019.  Não 
foram disponibilizados exames de RM prévios direcionados para a região pélvica.
RESSONÂNCIA MAGNÉTICA DO ABDOME E PELVE
Fígado  de  dimensões  normais,  contornos  regulares  e  bordos  finos.  Parênquima  com  intensidade  de  sinal 
habitual. Ausência de lesões focais. Veias hepáticas e ramos portais preservados.
Não há dilatação das vias biliares intra ou extra-hepáticas.
Vesícula  biliar  normodistendida,  com  paredes  finas  e  conteúdo  de  sinal  homogêneo,  sem  evidência  de 
cálculos.
Pâncreas,  adrenais  e  baço  de  dimensões  normais  e  intensidade  de  sinal  preservada.  Não  há  dilatação  do 
ducto pancreático principal.
Rins  tópicos,  com  contornos,  dimensões  e  espessura  do  parênquima  conservados.  Existe  concentração 
adequada do meio de contraste paramagnético. Não há sinais de dilatação pielocalicinal.
Aorta e veia cava inferior com calibre e contornos normais.
Ausência de linfonodomegalia retroperitoneal ou pélvica.
Bexiga com boa repleção, paredes finas e conteúdo de sinal homogêneo.
Alças intestinais sem sinais aparentes de alterações focais.
Acentuada redução volumétrica com marcado baixo sinal em T2 sugerindo densa fibrose pós-tratamento do 
colo uterino, sem áreas de restrição à difusão ou realce anômalo. Discretas estriações também hipointensas 
aos paramétrios, mais evidente à esquerda, também sugerindo fibrose.
Terços distais ureterais livres.
Útero  em  anteversoflexão  medindo  4,9  x  2,9  x  3,8  cm,  com  volume  estimado  em  28,0  cm³.  Apresenta 
dimensões,  contornos  e  sinal  miometrial  normais.  Zona  juncional  com  intensidade  de  sinal  e  espessura 
mantidas. Endométrio centrado e homogêneo, com espessura de 0,2 cm.
Espaço retrocervical e do septo retovaginal livres.
Ovários com dimensões, contornos e sinal normais. Não se caracterizam cistos hemorrágicos.
Lâmina de líquido livre homogêneo na pelve.

IMPRESSÃO:
- Acentuada redução volumétrica com marcado baixo sinal em T2 sugerindo densa fibrose pós-tratamento do 
colo uterino, sem áreas de restrição à difusão ou realce anômalo. Discretas estriações também hipointensas 
aos paramétrios, mais evidente à esquerda, também sugerindo fibrose.
- Ausência de linfonodomegalias.

## 2020-06-14 ENCOUNTER — Ambulatory Visit: Payer: No Typology Code available for payment source | Attending: Gastroenterology | Admitting: Gastroenterology

## 2020-06-14 DIAGNOSIS — K22719 Barrett's esophagus with dysplasia, unspecified: Secondary | ICD-10-CM | POA: Diagnosis present

## 2020-06-14 DIAGNOSIS — K219 Gastro-esophageal reflux disease without esophagitis: Secondary | ICD-10-CM

## 2020-06-14 DIAGNOSIS — R7303 Prediabetes: Secondary | ICD-10-CM

## 2020-06-14 DIAGNOSIS — D126 Benign neoplasm of colon, unspecified: Secondary | ICD-10-CM | POA: Diagnosis not present

## 2020-06-14 NOTE — Progress Notes (Signed)
This 50 year old English speaking patient presents for follow up of her Colonoscopy. Has hematochezia.    The patient was found to have...  Findings:       The perianal and digital rectal examinations were normal. Pertinent negatives        include normal sphincter tone.       The terminal ileum appeared normal. This was biopsied with a cold forceps for        histology and Rule out IBD. Estimated blood loss was minimal.       The ascending colon appeared normal. This was biopsied with a cold forceps        for histology and Rule out IBD and microscopic colitis. Estimated blood loss        was minimal.       A 3 mm polyp was found in the transverse colon. The polyp was sessile. The        polyp was removed with a jumbo cold forceps. Resection and retrieval were        complete. Estimated blood loss was minimal.       A 3 mm polyp was found in the rectum. The polyp was sessile. The polyp was        removed with a jumbo cold forceps. Resection and retrieval were complete.        Estimated blood loss was minimal.       No additional abnormalities were found on retroflexion.       The exam was otherwise without abnormality.    Post Procedure Diagnosis:       - The examined portion of the ileum was normal. Biopsied.       - The ascending colon is normal. Biopsied.       - One 3 mm polyp in the transverse colon, removed with a jumbo cold forceps.        Resected and retrieved.       - One 3 mm polyp in the rectum, removed with a jumbo cold forceps. Resected        and retrieved.       - The examination was otherwise normal.    We reviewed its significance.    Patient Active Problem List:     Allergy     Acute gastritis without mention of hemorrhage     Intestinal infection due to Clostridium difficile     Pure hypercholesterolemia     Major depressive disorder, recurrent episode, moderate with anxious distress (HCC)     Generalized anxiety disorder     Benign positional vertigo     Contraceptive management     Tubular  adenoma of colon     GERD (gastroesophageal reflux disease)     Diarrhea     Cholecystitis     Epigastric abdominal pain     Mass of ampulla of Vater     Gastroesophageal reflux disease with esophagitis     Obesity     Colon polyp     Mucosal abnormality of duodenum     Abnormal LFTs     Allergic rhinitis due to pollen     Anisometropic amblyopia of right eye     Barrett's esophagus     Disorder of biliary tract     Gastroesophageal reflux disease     Mild intermittent asthma without complication     Prediabetes     Tinea pedis of both feet     Perimenopause  Hyperprolactinemia ([place])     Morbid obesity with BMI of 50.0-59.9, adult (HCC)     Hyperlipidemia     Abnormal MRI of head     Headache disorder      Past Surgical History:  No date: CHOLECYSTECTOMY  No date: NO SIGNIFICANT SURGICAL HISTORY  No date: OB ANTEPARTUM CARE CESAREAN DLVR & POSTPARTUM    Review of Patient's Allergies indicates:   Erythromycin            Itching   Erythromycin            Itching   Macrolides and keto*    Itching, Rash    Social History    Tobacco Use      Smoking status: Never Smoker      Smokeless tobacco: Never Used    Alcohol use: No    Drug use: No      Current Outpatient Medications   Medication Sig    naproxen (NAPROSYN) 500 MG tablet Take 1 tablet by mouth every 12 (twelve) hours as needed as needed for pain. Take with food  for up to 15 days    omeprazole (PRILOSEC) 40 MG capsule Take 1 capsule by mouth daily    atorvastatin (LIPITOR) 40 MG tablet Take 1 tablet by mouth daily    DULoxetine (CYMBALTA) 30 MG capsule Take 1 each morning.    albuterol (PROVENTIL HFA,VENTOLIN HFA, PROAIR HFA) 108 (90 Base) MCG/ACT inhaler Inhale 2 puffs into the lungs every 4 (four) hours as needed for Wheezing or Shortness of breath     No current facility-administered medications for this visit.       Review of patient's family history indicates:  Problem: OTHER      Relation: Father          Age of Onset: (Not Specified)           Comment: leukemia   Problem: Hypertension      Relation: Father          Age of Onset: (Not Specified)  Problem: Lipids      Relation: Mother          Age of Onset: (Not Specified)  Problem: OTHER      Relation: Mother          Age of Onset: (Not Specified)          Comment: ESRD on HD  Problem: Diabetes      Relation: Mother          Age of Onset: (Not Specified)  Problem: Hypertension      Relation: Mother          Age of Onset: (Not Specified)  Problem: Renal      Relation: Mother          Age of Onset: (Not Specified)          Comment: on dialysis  Problem: Hypertension      Relation: Brother          Age of Onset: (Not Specified)    No family history of colon cancer, IBD.    REVIEW OF SYSTEMS:    Cardiovascular:  No chest pain, palpitations, MI or heart failure.  Pulmonary:  No pneumonia, TB or asthma.    Neuro:  No stroke, seizure or loss of consciousness.    Endocrine:  No diabetes or thyroid disease.      Labs  Path:  >>FINAL DIAGNOSIS<<      TERMINAL ILEUM BIOPSY:   -  NORMAL ILEAL MUCOSA.   - NO CHANGES OF INFLAMMATORY BOWEL DISEASE ARE IDENTIFIED.      ASCENDING COLON BIOPSY:   - NORMAL COLONIC MUCOSA.   - THERE IS NO EVIDENCE OF INFLAMMATORY BOWEL DISEASE.   - THERE IS NO EVIDENCE OF LYMPHOCYTIC (MICROSCOPIC) COLITIS.      TRANSVERSE COLON, 3 MM POLYP:   - TUBULAR ADENOMA.      RECTUM, 3 MM POLYP:   - HYPERPLASTIC POLYP.        Impression:  Tubular adenoma of colon  (primary encounter diagnosis)  Barrett's esophagus with dysplasia  Gastroesophageal reflux disease, unspecified whether esophagitis present  Prediabetes    Medical Decision Making:  The patient should have a follow up Colonoscopy in 5 yrs. We also discussed the fact that she should come in sooner should she have any problems with her bowels. She knows that the Colonoscopy done was not perfect and that we could have missed a lesion. She also knows that it is her responsibility to set up the follow up appointment.    The patient will also  contact her blood relatives who are 69 and older. She will make sure that they have their colon screening exams done.    Over 21 minutes spent with the patient, more than half of which were spent in counseling.

## 2020-10-05 ENCOUNTER — Emergency Department
Admission: EM | Admit: 2020-10-05 | Discharge: 2020-10-05 | Disposition: A | Discharge status: Home / Self Care | Payer: No Typology Code available for payment source | Attending: Physician Assistant | Admitting: Physician Assistant

## 2020-10-05 ENCOUNTER — Encounter (HOSPITAL_BASED_OUTPATIENT_CLINIC_OR_DEPARTMENT_OTHER): Payer: Self-pay

## 2020-10-05 ENCOUNTER — Other Ambulatory Visit: Payer: Self-pay

## 2020-10-05 DIAGNOSIS — J0100 Acute maxillary sinusitis, unspecified: Secondary | ICD-10-CM | POA: Diagnosis not present

## 2020-10-05 DIAGNOSIS — R519 Headache, unspecified: Secondary | ICD-10-CM | POA: Insufficient documentation

## 2020-10-05 MED ORDER — KETOROLAC TROMETHAMINE 30 MG/ML INJ
15.0000 mg | Freq: Once | Status: AC
Start: 2020-10-05 — End: 2020-10-05
  Administered 2020-10-05: 15 mg via INTRAVENOUS
  Filled 2020-10-05: qty 1

## 2020-10-05 MED ORDER — AMOXICILLIN-POT CLAVULANATE 875-125 MG PO TABS
1.00 | ORAL_TABLET | Freq: Two times a day (BID) | ORAL | 0 refills | Status: AC
Start: 2020-10-05 — End: 2020-10-12

## 2020-10-05 MED ORDER — SODIUM CHLORIDE 0.9 % IV BOLUS
1000.0000 mL | Freq: Once | INTRAVENOUS | Status: AC
Start: 2020-10-05 — End: 2020-10-05
  Administered 2020-10-05: 1000 mL via INTRAVENOUS

## 2020-10-05 MED ORDER — DEXAMETHASONE SOD PHOSPHATE 10 MG/ML IJ SOLN (SUPER ERX)
10.0000 mg | Freq: Once | Status: AC
Start: 2020-10-05 — End: 2020-10-05
  Administered 2020-10-05: 10 mg via INTRAVENOUS
  Filled 2020-10-05: qty 1

## 2020-10-05 NOTE — Narrator Note (Signed)
Patient Disposition  Patient education for diagnosis, medications, activity, diet and follow-up.  Patient left ED 9:35 PM.  Patient rep received written instructions.    Interpreter to provide instructions: No    Patient belongings with patient: YES    Have all existing LDAs been addressed? Yes    Have all IV infusions been stopped? Yes    Destination: Discharged to home. Reviewed d/c instructions including follow up care and scripts. Pt verbalized understanding. Pt left ED via ambulating w steady gait.

## 2020-10-05 NOTE — ED Provider Notes (Signed)
ED nursing record was reviewed. Prior records as available electronically through the Epic record were reviewed.    HPI:    This 50 year old female presents to the Emergency Department with chief complaint of intermittent headache, left-sided sinus pressure, and left ear fullness x6 days.  Pain improves when she takes Tylenol and Sudafed, however when the medications wear off the symptoms returned.  Reports associated postnasal drip.  Denies any fever, tinnitus, hearing changes, vision changes, sore throat, cough, vomiting or diarrhea.  No chest pain or dyspnea.    ROS: Pertinent positives were reviewed as per the HPI above. All other systems were reviewed and are negative.    Past Medical History/Problem list:  Past Medical History:  06/12/2004: ACUTE GASTRITIS W/O HEMORRHAGE      Comment:  H. Pylori positive (done at [facility]); GI consult Dr.               Phillis Knack, TOC 10/01/04  03/26/2004: ALLERGY, UNSPECIFIED      Comment:  Seen by ENT, deviated septum, hypertrophy of inferior                nasal turbinates  No date: Anxiety  No date: Asthma  07/26/2004: CLOSTRIDIUM DIFFICILE      Comment:  GI cosult: Dr. Rollene Rotunda, TOC done on 10/01/04  No date: Elevated cholesterol      Comment:  resolved without meds  No date: Esophageal reflux  No date: Snores  No date: Stomach disease  No date: Unspecified asthma(493.90)  No date: Wears eyeglasses      Comment:  night driving  Patient Active Problem List:     Allergy     Acute gastritis without mention of hemorrhage     Intestinal infection due to Clostridium difficile     Pure hypercholesterolemia     Major depressive disorder, recurrent episode, moderate with anxious distress (HCC)     Generalized anxiety disorder     Benign positional vertigo     Contraceptive management     Tubular adenoma of colon     GERD (gastroesophageal reflux disease)     Diarrhea     Cholecystitis     Epigastric abdominal pain     Mass of ampulla of Vater     Gastroesophageal reflux disease with  esophagitis     Obesity     Colon polyp     Mucosal abnormality of duodenum     Abnormal LFTs     Allergic rhinitis due to pollen     Anisometropic amblyopia of right eye     Barrett's esophagus     Disorder of biliary tract     Gastroesophageal reflux disease     Mild intermittent asthma without complication     Prediabetes     Tinea pedis of both feet     Perimenopause     Hyperprolactinemia (HCC)     Morbid obesity with BMI of 50.0-59.9, adult (HCC)     Hyperlipidemia     Abnormal MRI of head     Headache disorder        Past Surgical History: Past Surgical History:  No date: CHOLECYSTECTOMY  No date: NO SIGNIFICANT SURGICAL HISTORY  No date: OB ANTEPARTUM CARE CESAREAN DLVR & POSTPARTUM      Medications: No current facility-administered medications on file prior to encounter.  naproxen (NAPROSYN) 500 MG tablet, Take 1 tablet by mouth every 12 (twelve) hours as needed as needed for pain. Take with food  for up to 15 days, Disp: 20 tablet, Rfl: 0  omeprazole (PRILOSEC) 40 MG capsule, Take 1 capsule by mouth daily, Disp: 30 capsule, Rfl: 2  atorvastatin (LIPITOR) 40 MG tablet, Take 1 tablet by mouth daily, Disp: 30 tablet, Rfl: 3  DULoxetine (CYMBALTA) 30 MG capsule, Take 1 each morning., Disp: 30 capsule, Rfl: 0  albuterol (PROVENTIL HFA,VENTOLIN HFA, PROAIR HFA) 108 (90 Base) MCG/ACT inhaler, Inhale 2 puffs into the lungs every 4 (four) hours as needed for Wheezing or Shortness of breath, Disp: 1 Inhaler, Rfl: 1          Social History:   Social History     Socioeconomic History    Marital status: Married     Spouse name: Not on file    Number of children: Not on file    Years of education: Not on file    Highest education level: Not on file   Occupational History    Not on file   Tobacco Use    Smoking status: Never Smoker    Smokeless tobacco: Never Used   Substance and Sexual Activity    Alcohol use: No    Drug use: No    Sexual activity: Yes     Partners: Male     Comment: men age 41 q 4-6 wks x 3d,  small fibroid on u/s, 1st SA age 70, 2 lifetime partners, current x 8 yrs   Other Topics Concern    Not on file   Social History Narrative    07/09/2005    [name] family [place] age 67, husband from [place]    Works FT - [employer]    Pt thrilled with pregnancy-seeking pregnancy x 4 yrs, very anxious since sab 2 yrs ago    Has one child    Not working now    Lives with husband and daughter    Alton Revere, MD, 05/13/2016, 1:50 PM   Social Determinants of Health  Financial Resource Strain: Not on file  Food Insecurity: Not on file  Transportation Needs: Not on file  Physical Activity: Not on file  Stress: Not on file  Social Connections: Not on file  Intimate Partner Violence: Not on file  Housing Stability: Not on file        Allergies:  Review of Patient's Allergies indicates:   Erythromycin            Itching   Erythromycin            Itching   Macrolides and keto*    Itching, Rash      Physical Exam:  BP 149/93    Pulse 90    Temp 97.1 F    Resp 18    SpO2 95%     GENERAL: Well appearing, No acute distress, non-toxic.   SKIN:  Pink, warm, and dry. No erythema or rash.  HEAD:  Normocephalic, atraumatic. Sclerae are anicteric and non-injected. Oropharynx is clear with moist mucous membranes.  Tenderness to percussion of the left maxillary sinus.  No tenderness to percussion of the frontal sinuses or right maxillary sinus.  Tonsils symmetrical without erythema, edema, or exudates. Uvula midline.  Bilateral TMs clear. No mastoid tenderness or erythema.   NECK:   Supple with full range of motion. No meningismus.  No lymphadenopathy.   LUNGS:  Clear to auscultation bilaterally. No wheezes, rales, rhonchi.   HEART:  RRR.  No appreciated murmurs.   NEUROLOGIC:  Alert and oriented x4; moves all extremities well; speaking in clear fluent sentences. CNsII-XII symmetrical and intact. Sensation intact to light touch throughout. 5/5 strength globally.  PSYCHIATRIC:  Appropriate  for age, time of day, and situation      RESULTS  No results found for this visit on 10/05/20 (from the past 24 hour(s)).         MEDICATIONS ADMINISTERED ON THIS VISIT  Orders Placed This Encounter      sodium chloride 0.9 % IV bolus 1,000 mL      ketorolac (TORADOL) injection 15 mg      dexamethasone (DECADRON) injection 10 mg      amoxicillin-clavulanate (AUGMENTIN) 875-125 MG per tablet          Sig: Take 1 tablet by mouth in the morning and 1 tablet before bedtime. Do all this for 7 days.          Dispense:  14 tablet          Refill:  0         ED Course and Medical Decision-making:  This 50 year old female patient presents for evaluation of intermittent left-sided headache, sinus pressure, ear fullness x6 days.  On exam patient is in no acute distress with normal vitals.  She has tenderness to the left maxillary sinus.  No other significant findings on exam.    Presentation concerning for acute sinusitis with associated tension type headache.  No evidence of AOM, OE, mastoiditis.  No focal neurological deficits, doubt cerebral venous sinus thrombosis, mass, hemorrhage.  She was treated with fluids, Toradol, and Decadron with resolution of headache.  Will treat with course of Augmentin for possible bacterial sinusitis given duration of symptoms.  Encourage outpatient follow-up.    Patient was instructed to follow-up with PCP in 7days for re-evaluation.  Signs and symptoms for which to return to the Emergency Department were discussed with the patient in detail, and they understand and are in agreement with their care plan at this time.    The patient remained hemodynamically stable throughout entire visit.    Condition: stable  Disposition: home      Diagnosis/Diagnoses:  Generalized headache  Acute non-recurrent maxillary sinusitis    Cyril Loosen, PA-C  [facility]    This Emergency Department patient encounter note was created using voice-recognition software and in  real time during the ED visit. Please excuse any typographical errors that have not been edited out.

## 2020-10-05 NOTE — ED Triage Note (Signed)
Patient self presents to the ED c/o intermittent headache primarily on the left side and sinus pressure for the past 5-6 days. Has been taking Tylenol and sudafed with relief but the symptoms return right after the medication wears off. Reports her face and head are tender to touch. Denies any fevers, cough, or shortness of breath.

## 2020-10-18 IMAGING — MR e+1 ABD PELVE
21 of 51 series · 21 of 51 positions shown · non-contrast
Comparison: none

[Series 7: T2 · sagittal · 3.5mm · 0.47mm/px · 1 of 35 slices shown (1 of 3)]
[im 1/35]
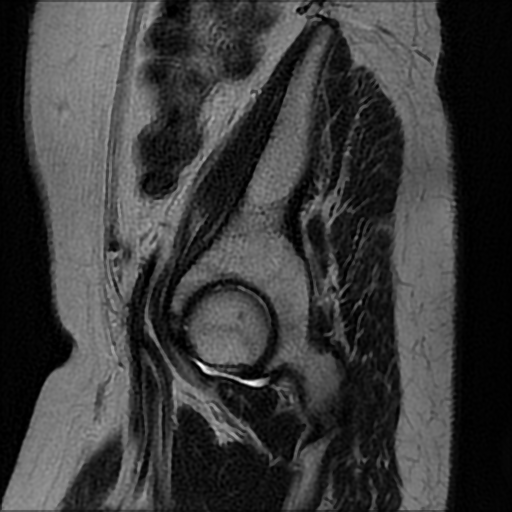

[Series 9: T2 · axial · 3.5mm · 0.47mm/px · 1 of 45 slices shown (2 of 3)]
[im 1/45]
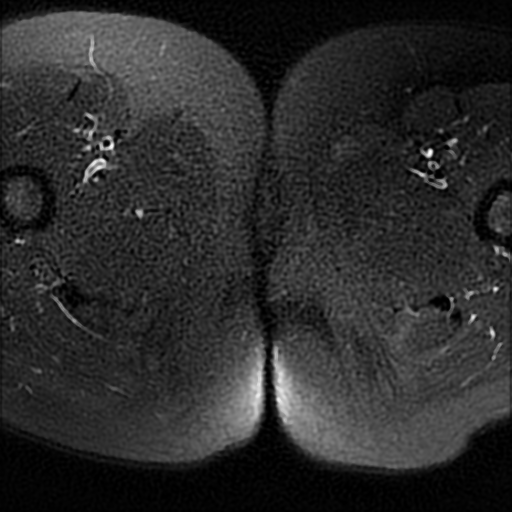

[Series 12: (person_name)_23 · axial · 15.0mm · 7.50mm/px · 1 of 128 slices shown]
[im 1/128]
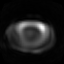

[Series 14: ax difusao abd · axial · 6.0mm · 1.48mm/px · 1 of 80 slices shown]
[im 1/80]
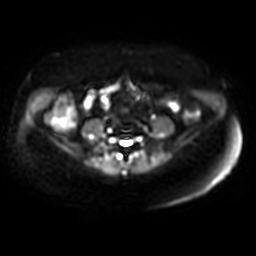

[Series 17: T2 · coronal · 6.0mm · 0.86mm/px · 1 of 29 slices shown (3 of 3)]
[im 1/29]
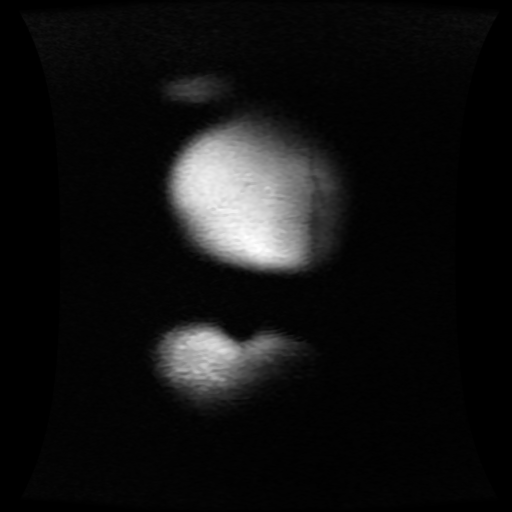

[Series 20: DIXON · axial · 8.0mm · 1.64mm/px · 1 of 28 slices shown (1 of 3)]
[im 1/28]
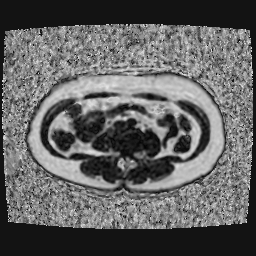

[Series 23: T1 dynamic · axial · 4.6mm · 0.86mm/px · 1 of 60 slices shown (1 of 9)]
[im 1/60]
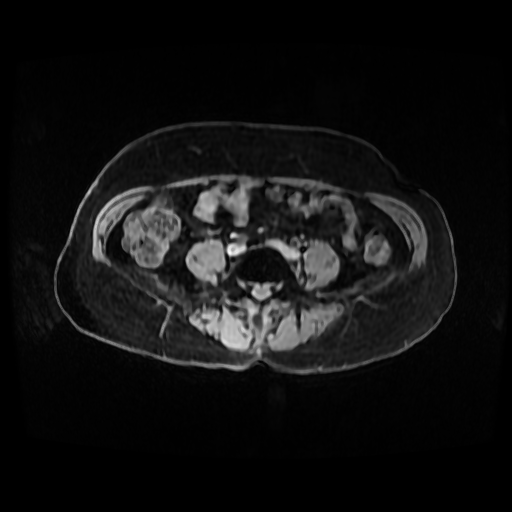

[Series 600: multiplanar reconstruction (mpr) · coronal · 7.0mm · 0.66mm/px · 1 of 19 slices shown]
[im 1/19]
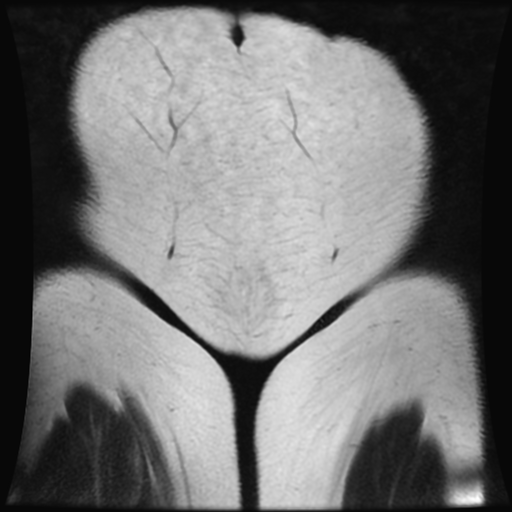

[Series 1051: eadc(no-q):(date) (date) -03 · axial · 4.0mm · 1.09mm/px · 1 of 46 slices shown]
[im 1/46]
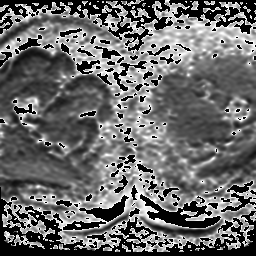

[Series 1102: T1 dynamic · axial · non-contrast · 3.6mm · 0.59mm/px · 1 of 112 slices shown (2 of 9)]
[im 1/112]
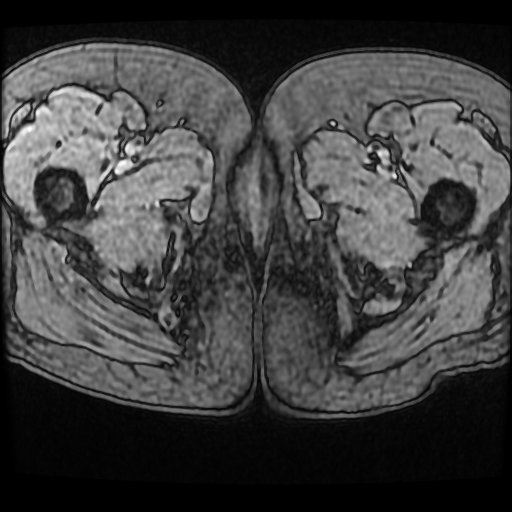

[Series 2000: DIXON · axial · 8.0mm · 1.64mm/px · 1 of 28 slices shown (2 of 3)]
[im 1/28]
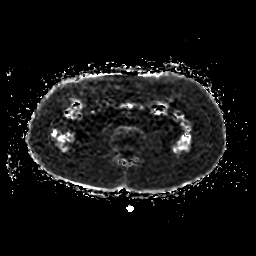

[Series 2002: DIXON · axial · 8.0mm · 1.64mm/px · 1 of 28 slices shown (3 of 3)]
[im 1/28]
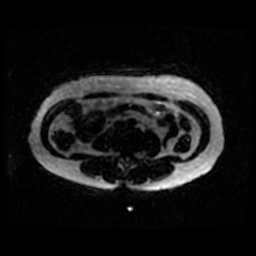

[Series 2102: T1 dynamic · axial · 4.6mm · 0.86mm/px · 1 of 120 slices shown (3 of 9)]
[im 1/120]
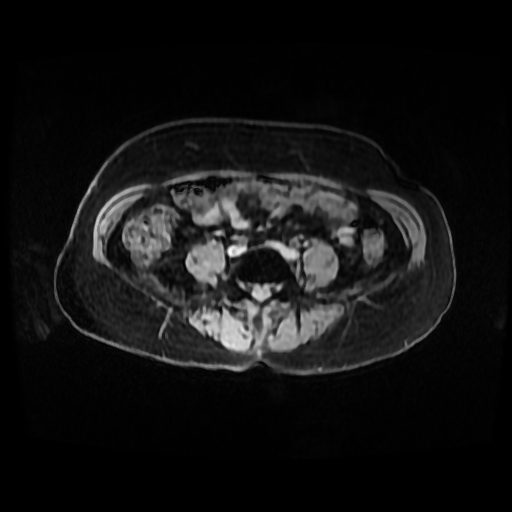

[Series 2104: T1 dynamic · axial · 64.0mm · 1.49mm/px · 1 of 7 slices shown (4 of 9)]
[im 1/7]
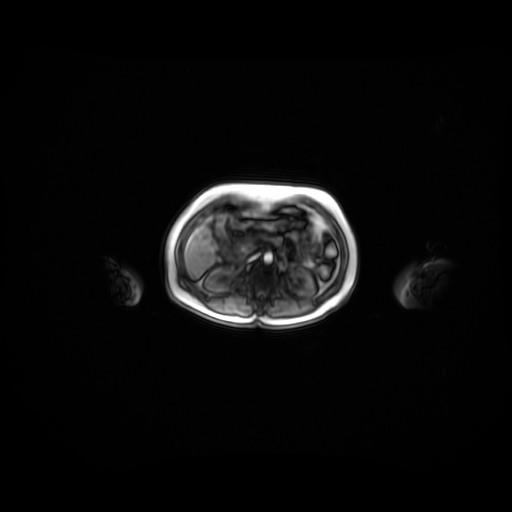

[Series 2151: T1 dynamic · axial · 4.6mm · 0.86mm/px · 1 of 120 slices shown (5 of 9)]
[im 1/120]
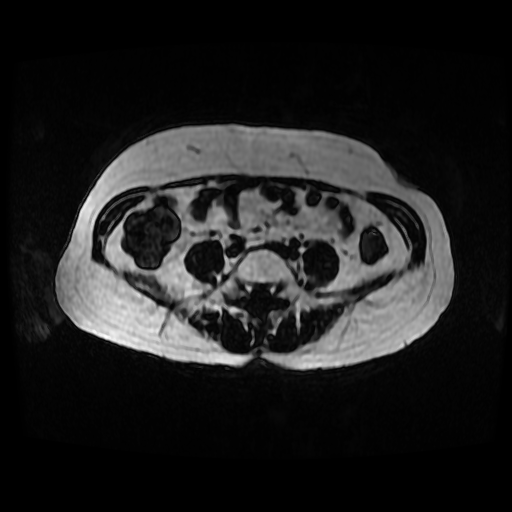

[Series 2153: T1 dynamic · axial · 4.6mm · 0.86mm/px · 1 of 120 slices shown (6 of 9)]
[im 1/120]
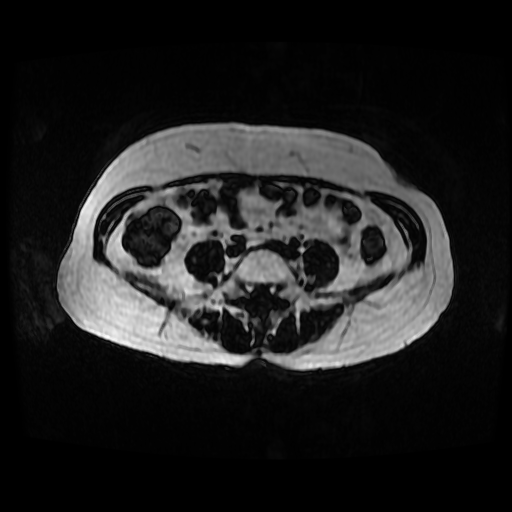

[Series 2400: T1 dynamic · axial · 3.6mm · 0.64mm/px · 1 of 112 slices shown (7 of 9)]
[im 1/112]
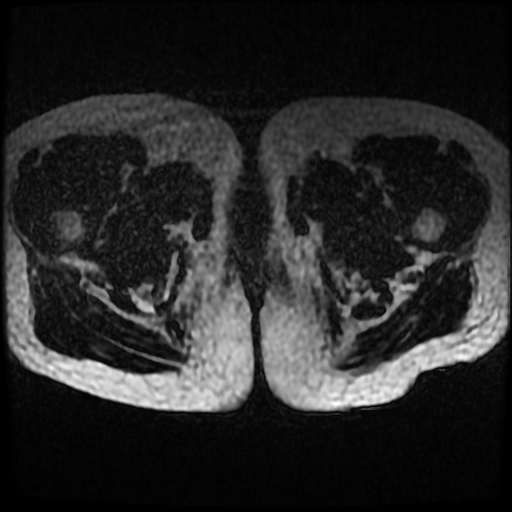

[Series 2402: T1 dynamic · axial · 3.6mm · 0.64mm/px · 1 of 112 slices shown (8 of 9)]
[im 1/112]
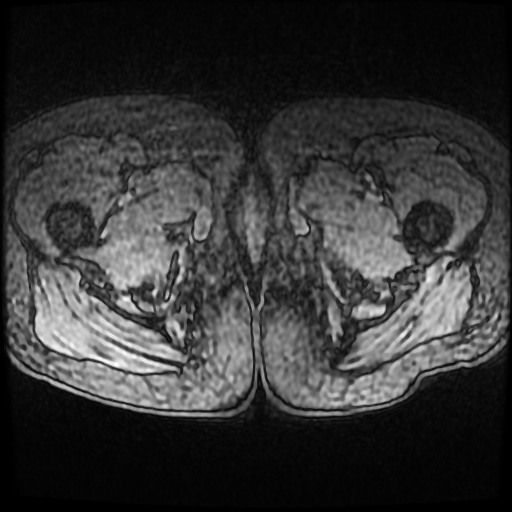

[Series 2500: T1 dynamic · sagittal · 3.6mm · 0.59mm/px · 1 of 112 slices shown (9 of 9)]
[im 1/112]
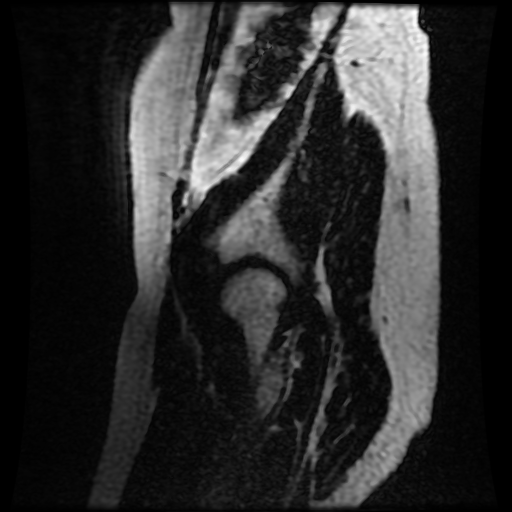

[((date))-((date)) · axial · 4.6mm · 0.86mm/px · 1 of 120 slices shown (1 of 2)]
[im 1/120]
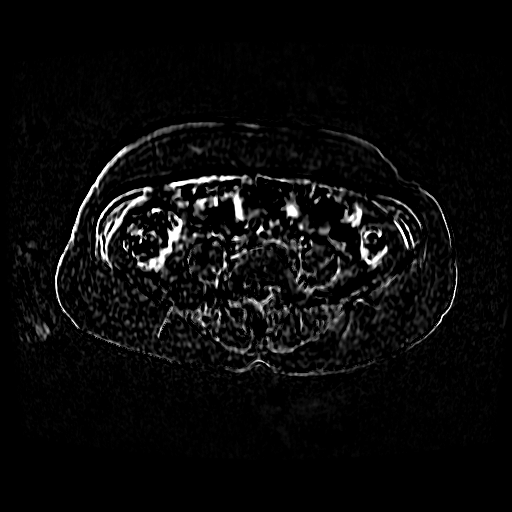

[((date))-((date)) · axial · 4.6mm · 0.86mm/px · 1 of 120 slices shown (2 of 2)]
[im 1/120]
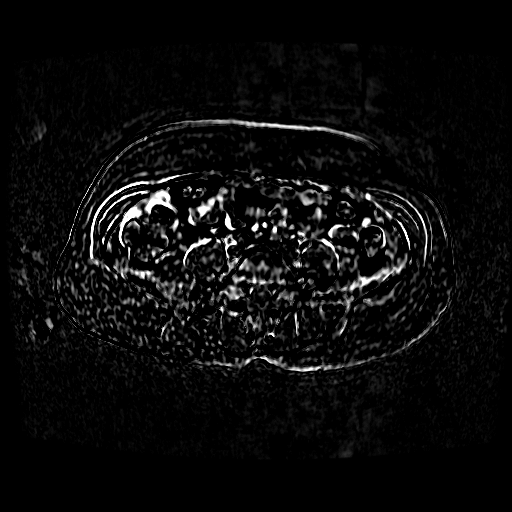

[21 of 51 positions shown; findings below may reference images not displayed]

Técnica:
Sequências multiplanares, ponderadas em T1 e T2, com contraste paramagnético endovenoso.

Foi realizada a análise comparativa com exame anterior de 04/04/2020.  Relatório:
RESSONÂNCIA MAGNÉTICA DO ABDOME E PELVE
Fígado  de  dimensões  normais,  contornos  regulares  e  bordos  finos.  Parênquima  com  intensidade  de  sinal 
habitual. Ausência de lesões focais. Veias hepáticas e ramos portais preservados.
Não há dilatação das vias biliares intra ou extra-hepáticas.
Vesícula  biliar  normodistendida,  com  paredes  finas  e  conteúdo  de  sinal  homogêneo,  sem  evidência  de 
cálculos.
Pâncreas,  adrenais  e  baço  de  dimensões  normais  e  intensidade  de  sinal  preservada.  Não  há  dilatação  do 
ducto pancreático principal.

Rins  tópicos,  com  contornos,  dimensões  e  espessura  do  parênquima  conservados.  Existe  concentração 
adequada do meio de contraste paramagnético. Não há sinais de dilatação pielocalicinal.
Aorta e veia cava inferior com calibre e contornos normais.
Ausência de linfonodomegalia retroperitoneal ou pélvica.
Bexiga com boa repleção, paredes finas e conteúdo de sinal homogêneo.
Alças intestinais sem sinais aparentes de alterações focais.
Acentuada redução volumétrica com marcado baixo sinal em T2 sugerindo densa fibrose pós-tratamento do 
colo uterino, sem áreas de restrição à difusão ou realce anômalo. Discretas estriações também hipointensas 
aos paramétrios, mais evidente à esquerda, também sugerindo fibrose.
Terços distais ureterais livres.
Útero  em  anteversoflexão  medindo 4,8  x  1,8  x  3,0  cm,  com  volume  estimado  em 14  cm³.  Apresenta 
dimensões,  contornos  e  sinal  miometrial  normais.  Zona  juncional  com  intensidade  de  sinal  e  espessura 
mantidas. Endométrio centrado e homogêneo, com espessura de 0,2 cm.
Espaço retrocervical e do septo retovaginal livres.
Ovários com dimensões, contornos e sinal normais, volume estimado em cerca de 1 cm³ cada.
Lâmina de líquido livre homogêneo na pelve.

Impressão:
-  Exame  de  controle  oncológico  (neoplasia  do  colo  uterino)  evidenciando  alterações  pós-actínicas  na 
cavidade pélvica, sem tecido neoplásico viável detectável neste estudo. Não foram evidenciadas alterações 
evolutivas significativas em relação ao exame prévio.

## 2020-12-19 ENCOUNTER — Ambulatory Visit: Payer: No Typology Code available for payment source | Attending: Gastroenterology | Admitting: Gastroenterology

## 2020-12-19 DIAGNOSIS — K2270 Barrett's esophagus without dysplasia: Secondary | ICD-10-CM | POA: Insufficient documentation

## 2020-12-19 DIAGNOSIS — R7989 Other specified abnormal findings of blood chemistry: Secondary | ICD-10-CM | POA: Diagnosis present

## 2020-12-19 DIAGNOSIS — K219 Gastro-esophageal reflux disease without esophagitis: Secondary | ICD-10-CM | POA: Diagnosis present

## 2020-12-19 NOTE — Progress Notes (Signed)
This 50 year old English speaking patient presents for follow up of her history of GERD, Barrett's esopahgitis, and a history of colon polyps.    She has occasional heartburn.     The patient denied abdominal pain, dysphagia, odynophagia, early satiety, weight loss, nausea, vomiting, hematemesis, hematochezia, melena, diarrhea, bowel urgency, constipation, fever, anemia, or jaundice.    The patient has overall been doing well.    Patient Active Problem List:     Allergy     Acute gastritis without mention of hemorrhage     Intestinal infection due to Clostridium difficile     Pure hypercholesterolemia     Major depressive disorder, recurrent episode, moderate with anxious distress (HCC)     Generalized anxiety disorder     Benign positional vertigo     Contraceptive management     Tubular adenoma of colon     GERD (gastroesophageal reflux disease)     Diarrhea     Cholecystitis     Epigastric abdominal pain     Mass of ampulla of Vater     Gastroesophageal reflux disease with esophagitis     Obesity     Colon polyp     Mucosal abnormality of duodenum     Abnormal LFTs     Allergic rhinitis due to pollen     Anisometropic amblyopia of right eye     Barrett's esophagus     Disorder of biliary tract     Gastroesophageal reflux disease     Mild intermittent asthma without complication     Prediabetes     Tinea pedis of both feet     Perimenopause     Hyperprolactinemia (HCC)     Morbid obesity with BMI of 50.0-59.9, adult (HCC)     Hyperlipidemia     Abnormal MRI of head     Headache disorder      Past Surgical History:  No date: CHOLECYSTECTOMY  No date: NO SIGNIFICANT SURGICAL HISTORY  No date: OB ANTEPARTUM CARE CESAREAN DLVR & POSTPARTUM    Review of Patient's Allergies indicates:   Erythromycin            Itching   Erythromycin            Itching   Macrolides and keto*    Itching, Rash      Social History    Tobacco Use      Smoking status: Never Smoker      Smokeless tobacco: Never Used    Alcohol use: No    Drug  use: No        Current Outpatient Medications   Medication Sig    naproxen (NAPROSYN) 500 MG tablet Take 1 tablet by mouth every 12 (twelve) hours as needed as needed for pain. Take with food  for up to 15 days    omeprazole (PRILOSEC) 40 MG capsule Take 1 capsule by mouth daily    atorvastatin (LIPITOR) 40 MG tablet Take 1 tablet by mouth daily    DULoxetine (CYMBALTA) 30 MG capsule Take 1 each morning.    albuterol (PROVENTIL HFA,VENTOLIN HFA, PROAIR HFA) 108 (90 Base) MCG/ACT inhaler Inhale 2 puffs into the lungs every 4 (four) hours as needed for Wheezing or Shortness of breath     No current facility-administered medications for this visit.         Review of patient's family history indicates:  Problem: OTHER      Relation: Father  Age of Onset: (Not Specified)          Comment: leukemia   Problem: Hypertension      Relation: Father          Age of Onset: (Not Specified)  Problem: Lipids      Relation: Mother          Age of Onset: (Not Specified)  Problem: OTHER      Relation: Mother          Age of Onset: (Not Specified)          Comment: ESRD on HD  Problem: Diabetes      Relation: Mother          Age of Onset: (Not Specified)  Problem: Hypertension      Relation: Mother          Age of Onset: (Not Specified)  Problem: Renal      Relation: Mother          Age of Onset: (Not Specified)          Comment: on dialysis  Problem: Hypertension      Relation: Brother          Age of Onset: (Not Specified)      REVIEW OF SYSTEMS:    Cardiovascular:  No chest pain, palpitations, MI or heart failure.  Pulmonary:  No pneumonia, TB or asthma.    Neuro:  No stroke, seizure or loss of consciousness.    Endocrine:  No diabetes or thyroid disease.        Impression:  Barrett's esophagus without dysplasia  (primary encounter diagnosis)  Gastroesophageal reflux disease, unspecified whether esophagitis present  Abnormal LFTs    Medical Decision Making:  The patient will be set up for a follow up EGD for follow up  of her GERD and Barrett's esophagitis. We will also recheck her LFT's. Her last set in 2019 were benign but we are concerned about fatty liver.    Reviewed with patient who agrees with our plan. All questions answered.

## 2021-09-19 IMAGING — MR e+1 ABDT
24 of 49 series · 24 of 49 positions shown · non-contrast
Comparison: none

[Series 4: ax difusao abd · axial · 6.0mm · 1.64mm/px · 1 of 84 slices shown]
[im 1/84]
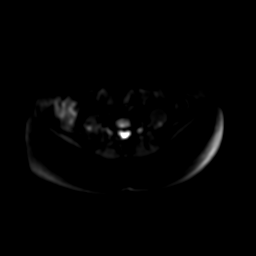

[Series 5: T2 fat-sat · axial · 6.0mm · 1.64mm/px · 1 of 42 slices shown]
[im 1/42]
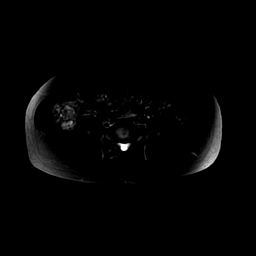

[Series 7: T2 · coronal · 6.0mm · 0.88mm/px · 1 of 32 slices shown (1 of 5)]
[im 1/32]
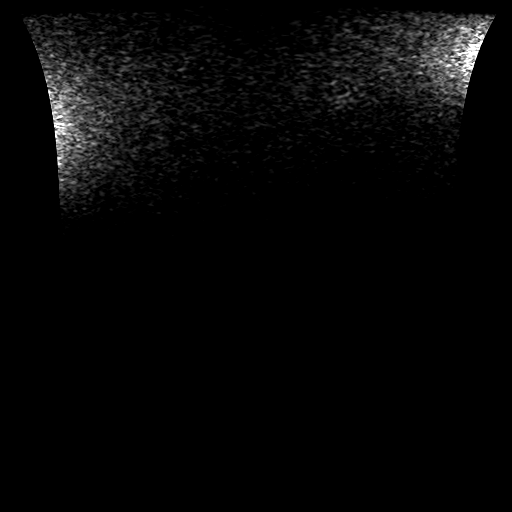

[Series 8: T2 · axial · 6.0mm · 0.86mm/px · 1 of 38 slices shown (2 of 5)]
[im 1/38]
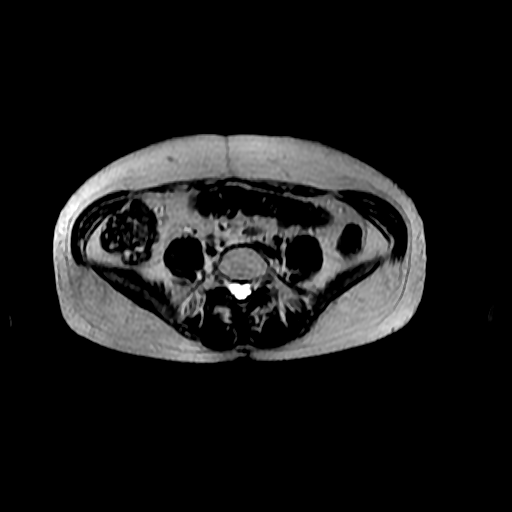

[Series 9: bSSFP · axial · 6.0mm · 0.86mm/px · 1 of 38 slices shown]
[im 1/38]
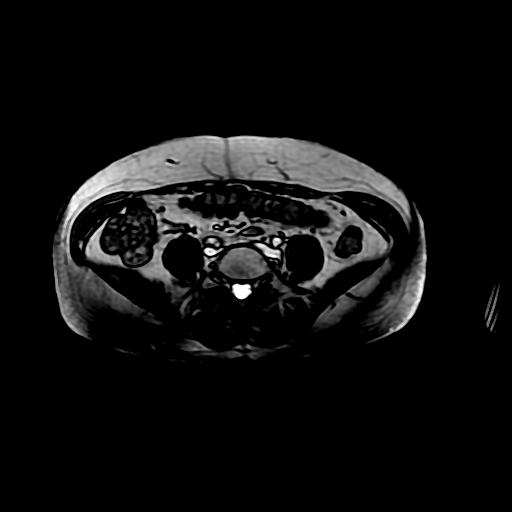

[Series 11: ax 2d dual · axial · 6.0mm · 0.86mm/px · 1 of 76 slices shown]
[im 1/76]
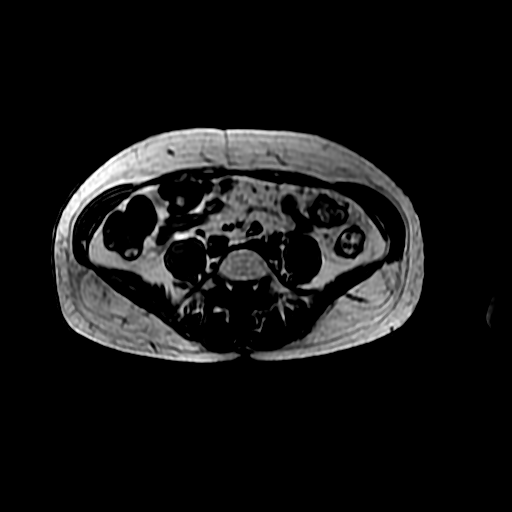

[Series 12: (person_name)_23 · axial · 15.0mm · 7.50mm/px · 1 of 128 slices shown]
[im 1/128]
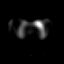

[Series 13: T2 · sagittal · 3.5mm · 0.47mm/px · 1 of 33 slices shown (3 of 5)]
[im 1/33]
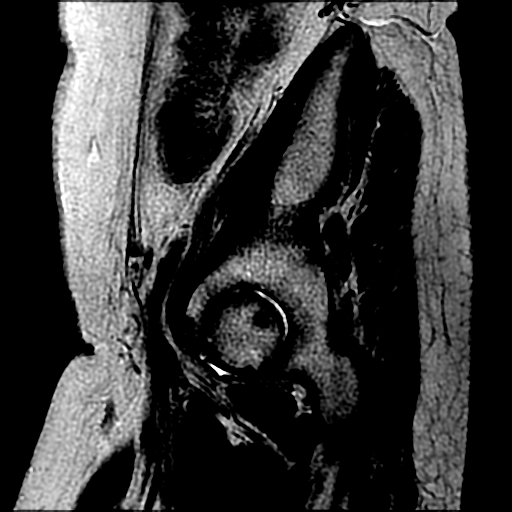

[Series 15: T2 · axial · 3.5mm · 0.47mm/px · 1 of 49 slices shown (4 of 5)]
[im 1/49]
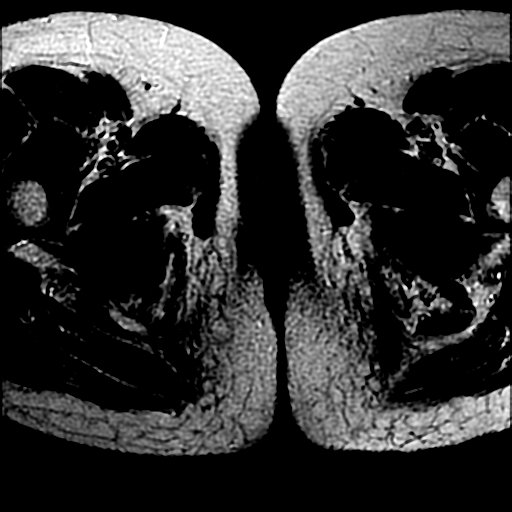

[Series 16: T2 · axial · 3.5mm · 0.47mm/px · 1 of 49 slices shown (5 of 5)]
[im 1/49]
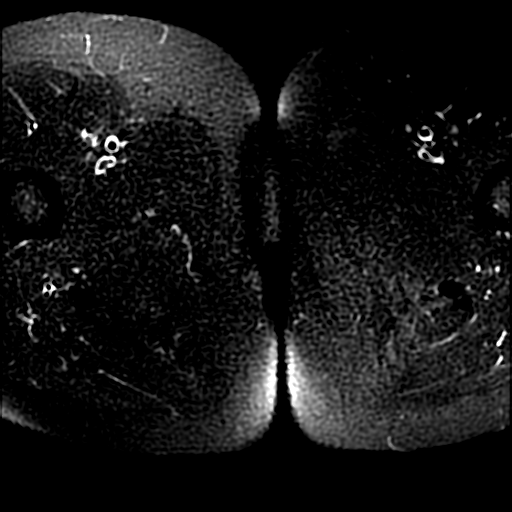

[Series 17: ax difusão pelve · axial · 4.0mm · 1.09mm/px · 1 of 98 slices shown]
[im 1/98]
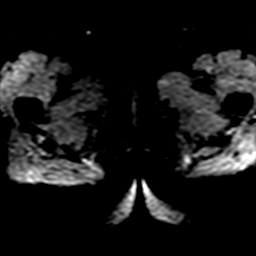

[Series 18: T1 dynamic · axial · non-contrast · 3.6mm · 0.59mm/px · 1 of 112 slices shown (1 of 9)]
[im 1/112]
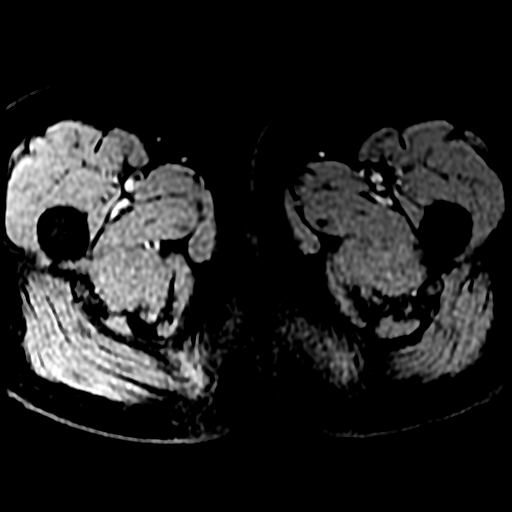

[Series 22: T1 dynamic · axial · 3.6mm · 0.64mm/px · 1 of 112 slices shown (2 of 9)]
[im 1/112]
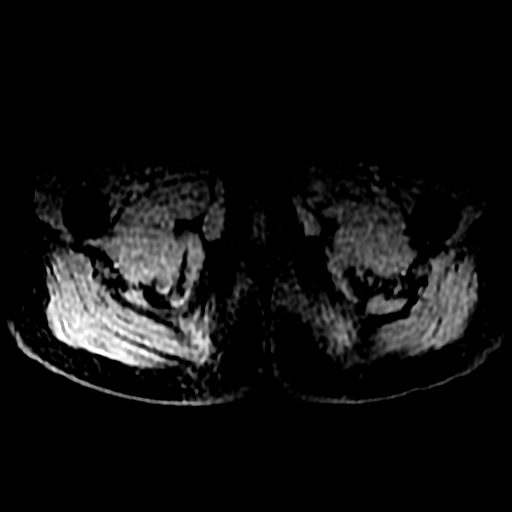

[Series 23: T1 dynamic · sagittal · 3.6mm · 0.59mm/px · 1 of 112 slices shown (3 of 9)]
[im 1/112]
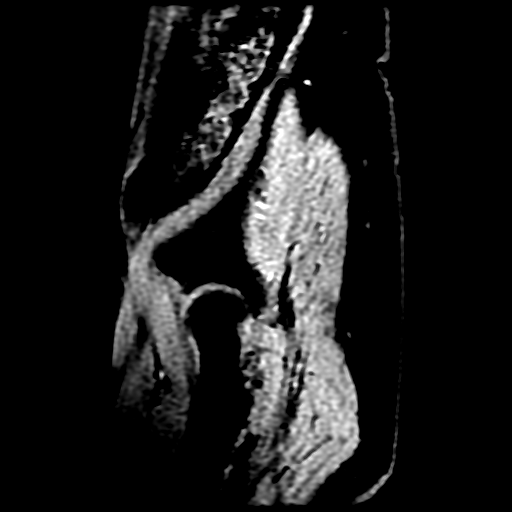

[Series 450: ADC · axial · 6.0mm · 1.64mm/px · 1 of 42 slices shown (1 of 2)]
[im 1/42]
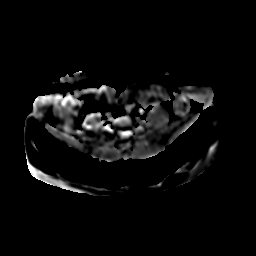

[Series 1400: multiplanar reconstruction (mpr) · coronal · 7.0mm · 0.66mm/px · 1 of 22 slices shown]
[im 1/22]
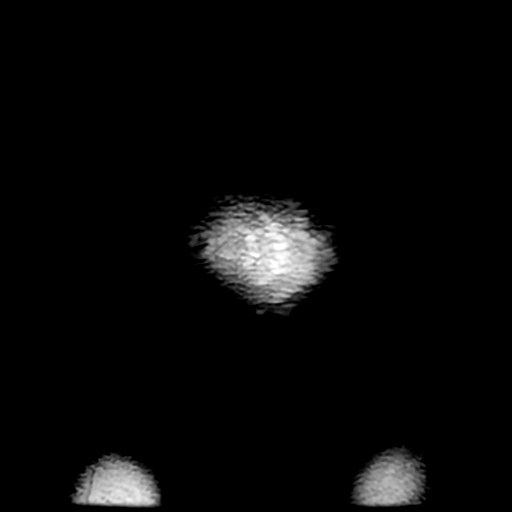

[Series 1750: ADC · axial · 4.0mm · 1.09mm/px · 1 of 50 slices shown (2 of 2)]
[im 1/50]
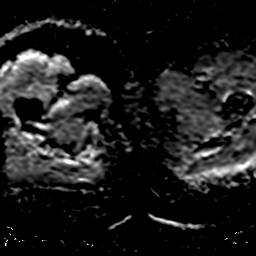

[Series 1751: eadc(no-q):(date) (date) -03 · axial · 4.0mm · 1.09mm/px · 1 of 50 slices shown]
[im 1/50]
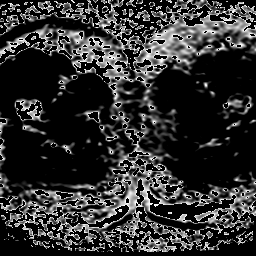

[Series 1800: T1 dynamic · axial · non-contrast · 3.6mm · 0.59mm/px · 1 of 112 slices shown (4 of 9)]
[im 1/112]
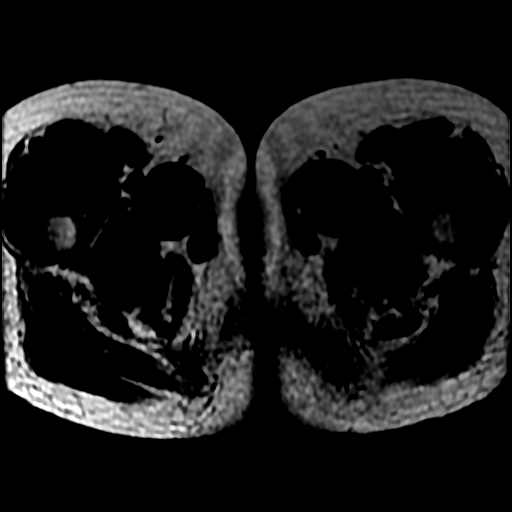

[Series 1801: T1 dynamic · axial · non-contrast · 3.6mm · 0.59mm/px · 1 of 112 slices shown (5 of 9)]
[im 1/112]
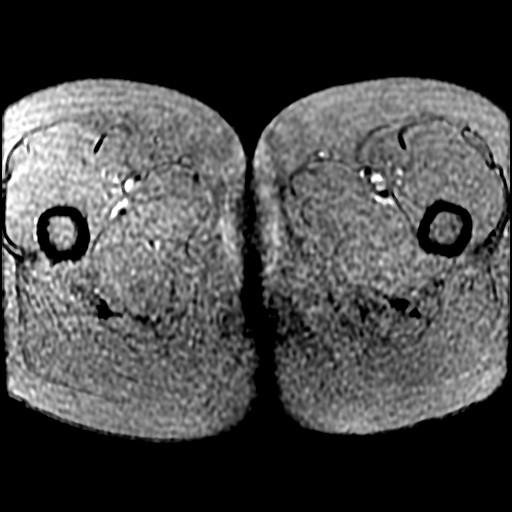

[Series 1802: T1 dynamic · axial · non-contrast · 3.6mm · 0.59mm/px · 1 of 112 slices shown (6 of 9)]
[im 1/112]
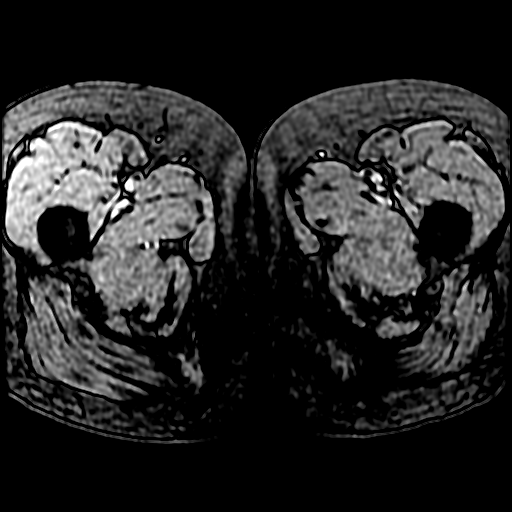

[Series 2200: T1 dynamic · axial · 3.6mm · 0.64mm/px · 1 of 112 slices shown (7 of 9)]
[im 1/112]
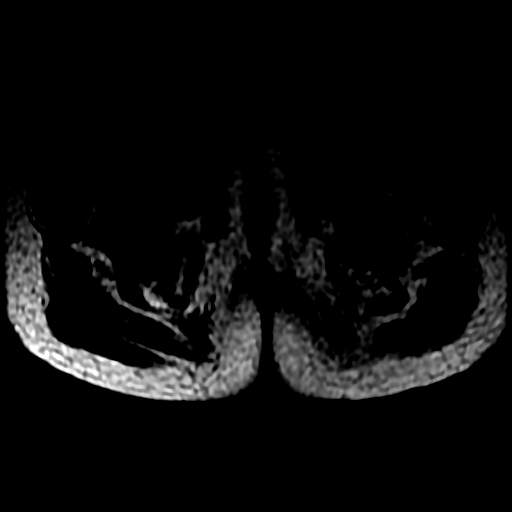

[Series 2201: T1 dynamic · axial · 3.6mm · 0.64mm/px · 1 of 112 slices shown (8 of 9)]
[im 1/112]
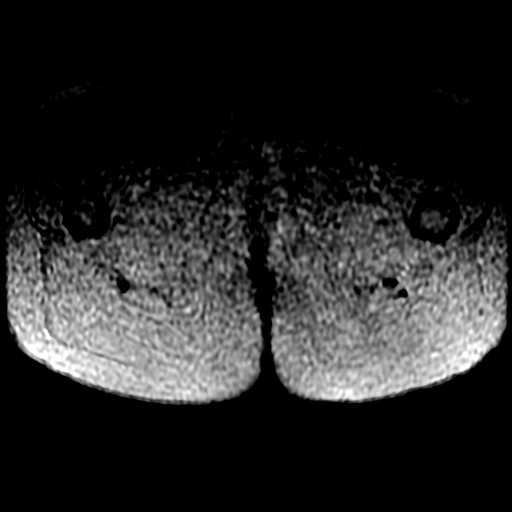

[Series 2202: T1 dynamic · axial · 3.6mm · 0.64mm/px · 1 of 112 slices shown (9 of 9)]
[im 1/112]
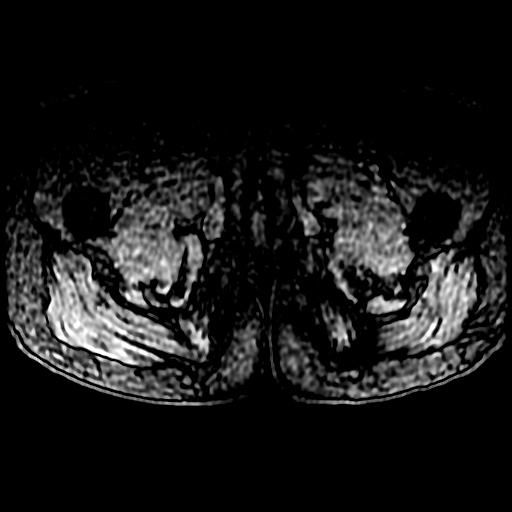

[24 of 49 positions shown; findings below may reference images not displayed]

TÉCNICA:
Sequências multiplanares, ponderadas em T1 e T2, com contraste paramagnético endovenoso.
ANÁLISE:
Fígado de dimensões normais, contornos regulares e intensidade de sinal habitual. Ausência de áreas de
realce anômalo pelo meio de contraste endovenoso.
RESSONÂNCIA MAGNÉTICA DO ABDOME TOTAL
Ausência de dilatação das vias biliares intra ou extra-hepáticas.
Vesícula biliar sem alterações ao método.
Pâncreas, baço e adrenais sem alterações significativas.
Rins tópicos, com dimensões e contornos preservados e intensidade de sinal habitual, concentrando e
eliminando satisfatoriamente o meio de contraste paramagnético. Ausência de dilatações dos sistemas
coletores.
Aorta e veia cava de curso e calibres preservados.
Não há linfonodomegalias retroperitoneais.
Bexiga com moderada repleção e conteúdo homogêneo.
Útero em anteversoflexão, com forma, contornos, dimensões e intensidade de sinal habituais. Zona juncional
e endométrio com espessura e intensidade de sinal normais. 
Colo uterino sem alterações significativas.
Ovários não caracterizados.
Não há evidências de linfonodomegalias na escavação pélvica.
Ausência de líquido livre significativo na cavidade abdominal. 
IMPRESSÃO:
- Exame sem alterações significativas.

## 2021-12-06 ENCOUNTER — Telehealth (HOSPITAL_BASED_OUTPATIENT_CLINIC_OR_DEPARTMENT_OTHER): Payer: Self-pay

## 2021-12-06 NOTE — Telephone Encounter (Signed)
Outreach to pt per staff message.     Pt states she has been a patient of Dr Rollene Rotunda for a long time and is upset that there is no appointments offered until may 2024. Pt states she is due for a colonoscopy and endoscopy. Pt looking for a sooner appt to get these taken care of. Advised pt will inform provider and get back to her with further recommendations. Pt states understanding and agrees w/ plan.    Pt states if any appts are available anytime after 9 am is best.

## 2021-12-06 NOTE — Telephone Encounter (Signed)
-----   Message from Alfred Levins sent at 12/05/2021  4:09 PM EDT -----  Regarding: appointment  [place] [id number], 51 year old, female, Telephone Information:  Home Phone      [phone]  Work Phone      [phone]  Mobile          [phone]    CONFIRMED TODAY: Yes    CALL BACK NUMBER:   Best time to call back: ANYTIME  Cell phone:   Other phone:    Available times:    Patient's language of care: English    Patient does not need an interpreter.    Patient's PCP: Janann August, MD    Person calling on behalf of patient: Patient (self)    Calls today requesting an appointment with Dr. Rollene Rotunda soon but Dr. Rollene Rotunda does not having anything available until 06/2022

## 2021-12-09 ENCOUNTER — Other Ambulatory Visit (HOSPITAL_BASED_OUTPATIENT_CLINIC_OR_DEPARTMENT_OTHER): Payer: Self-pay | Admitting: Physician Assistant

## 2021-12-09 DIAGNOSIS — K219 Gastro-esophageal reflux disease without esophagitis: Secondary | ICD-10-CM

## 2021-12-09 DIAGNOSIS — K2270 Barrett's esophagus without dysplasia: Secondary | ICD-10-CM

## 2021-12-09 NOTE — Telephone Encounter (Signed)
I reached out to the patient.  She reports for the past 3-4 months that she feels as thought he has been experiencing acid reflux, bloating and has been straining to have bm's at times.  When she does strain, she has bleeding.  She denies any external hemorrhoids.  She reports that she does feel internal hemorrhoids, but feels fullness.  I discussed that an order for EGD has been placed.  She feels that she should also have a Colonoscopy.  She would also like to have an appointment with Dr. Rollene Rotunda prior to any GI procedures.

## 2021-12-13 NOTE — Telephone Encounter (Signed)
Televisit scheduled next month with Dr. Rollene Rotunda.

## 2021-12-30 ENCOUNTER — Ambulatory Visit (HOSPITAL_BASED_OUTPATIENT_CLINIC_OR_DEPARTMENT_OTHER): Payer: No Typology Code available for payment source | Admitting: Gastroenterology

## 2021-12-31 ENCOUNTER — Ambulatory Visit: Payer: 59 | Attending: Internal Medicine | Admitting: Gastroenterology

## 2021-12-31 DIAGNOSIS — R748 Abnormal levels of other serum enzymes: Secondary | ICD-10-CM | POA: Diagnosis not present

## 2021-12-31 DIAGNOSIS — R7303 Prediabetes: Secondary | ICD-10-CM | POA: Diagnosis not present

## 2021-12-31 DIAGNOSIS — K2270 Barrett's esophagus without dysplasia: Secondary | ICD-10-CM

## 2021-12-31 DIAGNOSIS — K219 Gastro-esophageal reflux disease without esophagitis: Secondary | ICD-10-CM | POA: Diagnosis not present

## 2021-12-31 DIAGNOSIS — Z86010 Personal history of colonic polyps: Secondary | ICD-10-CM | POA: Diagnosis not present

## 2021-12-31 DIAGNOSIS — K760 Fatty (change of) liver, not elsewhere classified: Secondary | ICD-10-CM | POA: Diagnosis not present

## 2021-12-31 NOTE — Progress Notes (Signed)
This 51 year old English speaking patient presents for follow upof her history of GERD, BArrett's and colon polyps.    The patient notes she feels about the same. She was seen at [facility] on 09/26/2021 where Dr. Harold Hedge...  IMPRESSION AND PLAN:   1. Severe anxiety and depression, situational. She is anxious   and depressed about problems with her 56 year old daughter,   previously an Gaffer, who recently became depressed and   has been refusing to go to school for the last 3 months. She was   referred to collaborative care and I gave her a prescription for   hydroxyzine to be taken as needed for anxiety.   2. 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 Follow-up MRI of the   sella on 04/08/2018 was stable. She was most recently seen by   neuro-oncology on June 07, 2019. She had mild elevation of   prolactin consistent with compression of the stalk rather than   secreting adenoma. Repeat MRI of the pituitary was essentially   unchanged. She stopped cabergoline. She was seen by   endocrinology at [facility] on 05/05/2019. While   on carbergoline, her periods became regular. Was then off   cabergoline for more than 2 years. Repeat prolactin levels were   mildly elevated. Has menstrual periods 2-3 times a year and   spotting on all other months. [place] levels have been in normal to   high range consistent with peri-menopause. Prolactin level has   been stable in the mildly elevated range. She was most recently   seen by endocrinology on 03/26/2021, and given her increasing   prolactin level, cabergoline twice weekly was restarted. Plan is   to repeat MRI and follow this year to see if the clivus lesion   shrinks in response to cabergoline.   3. She does have a history of hypothyroidism, normal TSH and   borderline low free thyroxine. Thyroid ultrasound 10/27/2019 was   normal.   4.  Chronic abdominal pain, for which she is followed by a   gastroenterologist at [facility], has appointment   with him next month. She has a history of gastric reflux disease   and a positive H. pylori titer in 2006, for which she took   medical therapy. Her exam was essentially unremarkable, however   difficult given patient's body habitus. She also has a history of   precancerous polyps, she now has to have a colonoscopy every 2   years. EGD with colonoscopy was done December 15, 2017. Most   recent colonoscopy was 11/20/2009.   5. Dyspnea, patient has difficulty climbing stairs and gets   short of breath with only short distances. Electrocardiogram   showed a possible anterior infarct somewhere in the remote past,   but no acute changes. Echocardiogram done 01/24/2019 was   essentially normal.   6. Severe obesity. Her body mass index is around 54. She would   like to go on a medical weight loss program. I had given her a   prescription for phentermine and topiramate in the past, but she   never took it due to the ensuing COVID-19 crisis. She was also   referred to nutrition. She was encouraged to join an exercise   program. A home sleep study was also ordered, but never done.   7. Hyperlipidemia. Patient previously took  atorvastatin 40 mg,   not currently. Her lipid profile was not quite at target, most   recently done August 17, 2020.   8. Regular health care maintenance. Recent lab tests were all   normal, except for elevated cholesterol. Mammogram 03/26/2021   was normal. She is vaccinated against Covid-19 with [place] on   05/24/2019 and 06/15/2019. She is up-to-date with all vaccines       The patient denied abdominal pain, heartburn, regurgitation, dysphagia, odynophagia, early satiety, weight loss, nausea, vomiting, hematemesis, hematochezia, melena, bloating, eructation, flatulence, diarrhea, bowel urgency, constipation, fever, anemia, or jaundice.    The patient now presents in follow up.    Patient  Active Problem List:     Allergy     Acute gastritis without mention of hemorrhage     Intestinal infection due to Clostridium difficile     Pure hypercholesterolemia     Major depressive disorder, recurrent episode, moderate with anxious distress (HCC)     Generalized anxiety disorder     Benign positional vertigo     Contraceptive management     Tubular adenoma of colon     GERD (gastroesophageal reflux disease)     Diarrhea     Cholecystitis     Epigastric abdominal pain     Mass of ampulla of Vater     Gastroesophageal reflux disease with esophagitis     Obesity     Colon polyp     Mucosal abnormality of duodenum     Abnormal LFTs     Allergic rhinitis due to pollen     Anisometropic amblyopia of right eye     Barrett's esophagus     Disorder of biliary tract     Gastroesophageal reflux disease     Mild intermittent asthma without complication     Prediabetes     Tinea pedis of both feet     Perimenopause     Hyperprolactinemia (HCC)     Morbid obesity with BMI of 50.0-59.9, adult (HCC)     Hyperlipidemia     Abnormal MRI of head     Headache disorder      Past Surgical History:  No date: CHOLECYSTECTOMY  No date: NO SIGNIFICANT SURGICAL HISTORY  No date: OB ANTEPARTUM CARE CESAREAN DLVR & POSTPARTUM    Review of Patient's Allergies indicates:   Erythromycin            Itching   Erythromycin            Itching   Macrolides and keto*    Itching, Rash      Social History    Tobacco Use      Smoking status: Never      Smokeless tobacco: Never    Alcohol use: No    Drug use: No        Current Outpatient Medications   Medication Sig    naproxen (NAPROSYN) 500 MG tablet Take 1 tablet by mouth every 12 (twelve) hours as needed as needed for pain. Take with food  for up to 15 days    omeprazole (PRILOSEC) 40 MG capsule Take 1 capsule by mouth daily    atorvastatin (LIPITOR) 40 MG tablet Take 1 tablet by mouth daily    DULoxetine (CYMBALTA) 30 MG capsule Take 1 each morning.    albuterol (PROVENTIL HFA,VENTOLIN HFA,  PROAIR HFA) 108 (90 Base) MCG/ACT inhaler Inhale 2 puffs into the lungs every 4 (four) hours as needed for Wheezing or Shortness of  breath     No current facility-administered medications for this visit.         Review of patient's family history indicates:  Problem: OTHER      Relation: Father          Age of Onset: (Not Specified)          Comment: leukemia   Problem: Hypertension      Relation: Father          Age of Onset: (Not Specified)  Problem: Lipids      Relation: Mother          Age of Onset: (Not Specified)  Problem: OTHER      Relation: Mother          Age of Onset: (Not Specified)          Comment: ESRD on HD  Problem: Diabetes      Relation: Mother          Age of Onset: (Not Specified)  Problem: Hypertension      Relation: Mother          Age of Onset: (Not Specified)  Problem: Renal      Relation: Mother          Age of Onset: (Not Specified)          Comment: on dialysis  Problem: Hypertension      Relation: Brother          Age of Onset: (Not Specified)      REVIEW OF SYSTEMS:    Cardiovascular:  No chest pain, palpitations, MI or heart failure.  Pulmonary:  No pneumonia, TB or asthma.    Neuro:  No stroke, seizure or loss of consciousness.    Endocrine:  No diabetes or thyroid disease.      [place]:  12/31/2021 [name] [facility]):  FINDINGS:        LIVER: The liver is diffusely echogenic. The contour of the liver is smooth.   There is no focal liver mass.  The main portal vein is patent with hepatopetal   flow. There is no ascites.       BILE DUCTS: There is no intrahepatic biliary dilation.     CHD: 8 mm       GALLBLADDER: The patient is status post cholecystectomy.       PANCREAS: The imaged portion of the pancreas appears within normal limits,   without masses or pancreatic ductal dilation, with portions of the pancreatic   tail obscured by overlying bowel gas.       SPLEEN: Normal echogenicity.     Spleen length: 12.6 cm.       KIDNEYS: Normal cortical echogenicity and corticomedullary  differentiation is   seen bilaterally. There is no evidence of masses, stones, or hydronephrosis in   the kidneys.     Right kidney: 10.6 cm.     Left kidney: 11.9 cm.       RETROPERITONEUM: The visualized portions of aorta and IVC are within normal   limits.         IMPRESSION:           1. Echogenic liver consistent with steatosis. Other forms of liver disease   including steatohepatitis, hepatic fibrosis, or cirrhosis cannot be excluded   on the basis of this examination.   2. No additional sonographic abdominal abnormality.       Impression:  Barrett's esophagus without dysplasia  (primary encounter diagnosis)  Gastroesophageal reflux disease, unspecified whether esophagitis  present  Prediabetes  History of colonic polyps  NAFLD (nonalcoholic fatty liver disease)    Medical Decision Making:  The patient has had no major changes in her health. We discussed her weight and mild fatty liver. She has been seeing a gastroenterologist at [facility]. I reviewed her lab work and she looks as if things are stable.     She is due for follow up of her Barrett's and we will follow up on this. Also, her Alk phos was slightly elevated at [facility] and we will repeat this and an AMA.    Reviewed with patient who agrees with our plan.     Over 30 minutes spent on this patient. The patient did not connect on video.

## 2022-08-26 LAB — HEMOGLOBIN A1C CARE EVERYWHERE
ESTIMATED AVERAGE GLUCOSE CARE EVERYWHERE: 134 mg/dL — ABNORMAL HIGH (ref 68–126)
HEMOGLOBIN A1C CARE EVERYWHERE: 6.3 % — ABNORMAL HIGH (ref 4.8–5.9)

## 2022-10-15 ENCOUNTER — Other Ambulatory Visit: Payer: Self-pay

## 2022-10-15 ENCOUNTER — Emergency Department (HOSPITAL_BASED_OUTPATIENT_CLINIC_OR_DEPARTMENT_OTHER): Payer: 59

## 2022-10-15 ENCOUNTER — Emergency Department
Admission: EM | Admit: 2022-10-15 | Discharge: 2022-10-15 | Disposition: A | Discharge status: Home / Self Care | Payer: 59 | Attending: Student in an Organized Health Care Education/Training Program | Admitting: Student in an Organized Health Care Education/Training Program

## 2022-10-15 ENCOUNTER — Encounter (HOSPITAL_BASED_OUTPATIENT_CLINIC_OR_DEPARTMENT_OTHER): Payer: Self-pay

## 2022-10-15 DIAGNOSIS — R1032 Left lower quadrant pain: Secondary | ICD-10-CM | POA: Diagnosis not present

## 2022-10-15 DIAGNOSIS — R109 Unspecified abdominal pain: Secondary | ICD-10-CM

## 2022-10-15 DIAGNOSIS — R1013 Epigastric pain: Secondary | ICD-10-CM | POA: Diagnosis present

## 2022-10-15 DIAGNOSIS — R079 Chest pain, unspecified: Secondary | ICD-10-CM

## 2022-10-15 LAB — CBC, PLATELET & DIFFERENTIAL
ABSOLUTE BASO COUNT: 0.1 10*3/uL (ref 0.0–0.1)
ABSOLUTE EOSINOPHIL COUNT: 0.1 10*3/uL (ref 0.0–0.8)
ABSOLUTE IMM GRAN COUNT: 0.03 10*3/uL (ref 0.00–0.10)
ABSOLUTE LYMPH COUNT: 3 10*3/uL (ref 0.6–5.9)
ABSOLUTE MONO COUNT: 0.5 10*3/uL (ref 0.2–1.4)
ABSOLUTE NEUTROPHIL COUNT: 6.3 10*3/uL (ref 1.6–8.3)
ABSOLUTE NRBC COUNT: 0 10*3/uL (ref 0.0–0.0)
BASOPHIL %: 0.5 % (ref 0.0–1.2)
EOSINOPHIL %: 0.6 % (ref 0.0–7.0)
HEMATOCRIT: 43.6 % (ref 34.1–44.9)
HEMOGLOBIN: 14.1 g/dL (ref 11.2–15.7)
IMMATURE GRANULOCYTE %: 0.3 % (ref 0.0–1.0)
LYMPHOCYTE %: 30.2 % (ref 15.0–54.0)
MEAN CORP HGB CONC: 32.3 g/dL (ref 31.0–37.0)
MEAN CORPUSCULAR HGB: 30.3 pg (ref 26.0–34.0)
MEAN CORPUSCULAR VOL: 93.6 fl (ref 80.0–100.0)
MEAN PLATELET VOLUME: 9.5 fL (ref 8.7–12.5)
MONOCYTE %: 4.8 % (ref 4.0–13.0)
NEUTROPHIL %: 63.6 % (ref 40.0–75.0)
NRBC %: 0 % (ref 0.0–0.0)
PLATELET COUNT: 249 10*3/uL (ref 150–400)
RBC DISTRIBUTION WIDTH STD DEV: 43 fL (ref 35.1–46.3)
RED BLOOD CELL COUNT: 4.66 M/uL (ref 3.90–5.20)
WHITE BLOOD CELL COUNT: 9.9 10*3/uL (ref 4.0–11.0)

## 2022-10-15 LAB — BASIC METABOLIC PANEL
ANION GAP: 14 mmol/L (ref 10–22)
BUN (UREA NITROGEN): 16 mg/dL (ref 7–18)
CALCIUM: 9.7 mg/dL (ref 8.5–10.5)
CARBON DIOXIDE: 27 mmol/L (ref 21–32)
CHLORIDE: 98 mmol/L (ref 98–107)
CREATININE: 0.8 mg/dL (ref 0.4–1.2)
ESTIMATED GLOMERULAR FILT RATE: >60 mL/min (ref >60)
Glucose Random: 86 mg/dL (ref 74–160)
POTASSIUM: 4.1 mmol/L (ref 3.5–5.1)
SODIUM: 139 mmol/L (ref 136–145)

## 2022-10-15 LAB — HEPATIC FUNCTION PANEL
ALANINE AMINOTRANSFERASE: 25 U/L (ref 12–45)
ALBUMIN: 4.4 g/dL (ref 3.4–5.2)
ALKALINE PHOSPHATASE: 100 U/L (ref 45–117)
ASPARTATE AMINOTRANSFERASE: 26 U/L (ref 8–34)
BILIRUBIN DIRECT: <0.2 mg/dL (ref 0.0–0.2)
BILIRUBIN TOTAL: 0.3 mg/dL (ref 0.2–1.0)
TOTAL PROTEIN: 7.7 g/dL (ref 6.4–8.2)

## 2022-10-15 LAB — URINALYSIS RFLX TO URINE CULT
BILIRUBIN, URINE: NEGATIVE
GLUCOSE, URINE: NEGATIVE MG/DL
KETONE, URINE: NEGATIVE MG/DL
LEUKOCYTE ESTERASE: NEGATIVE
NITRITE, URINE: NEGATIVE
OCCULT BLOOD, URINE: NEGATIVE
PH URINE: 6 (ref 5.0–8.0)
PROTEIN, URINE: NEGATIVE MG/DL
SPECIFIC GRAVITY URINE: 1.02 (ref 1.003–1.035)

## 2022-10-15 LAB — LIPASE: LIPASE: 15 U/L (ref 13–60)

## 2022-10-15 MED ORDER — FAMOTIDINE 20 MG PO TABS
20.00 mg | ORAL_TABLET | Freq: Two times a day (BID) | ORAL | 0 refills | Status: AC
Start: 2022-10-15 — End: 2022-10-29

## 2022-10-15 MED ORDER — LIDOCAINE VISCOUS HCL 2 % MT SOLN
10.00 mL | Freq: Once | OROMUCOSAL | Status: AC
Start: 2022-10-15 — End: 2022-10-15
  Administered 2022-10-15: 10 mL via ORAL
  Filled 2022-10-15: qty 15

## 2022-10-15 MED ORDER — IOHEXOL 350 MG/ML IV SOLN
75.00 mL | Freq: Once | INTRAVENOUS | Status: AC
Start: 2022-10-15 — End: 2022-10-15
  Administered 2022-10-15: 75 mL via INTRAVENOUS

## 2022-10-15 MED ORDER — SIMETHICONE 80 MG PO CHEW
80.0000 mg | CHEWABLE_TABLET | Freq: Four times a day (QID) | ORAL | Status: DC | PRN
Start: 2022-10-15 — End: 2022-10-16
  Administered 2022-10-15: 80 mg via ORAL
  Filled 2022-10-15: qty 1

## 2022-10-15 MED ORDER — FAMOTIDINE (PF) 20 MG/2ML IV SOLN
20.00 mg | Freq: Once | INTRAVENOUS | Status: AC
Start: 2022-10-15 — End: 2022-10-15
  Administered 2022-10-15: 20 mg via INTRAVENOUS
  Filled 2022-10-15: qty 2

## 2022-10-15 MED ORDER — ONDANSETRON HCL 4 MG/2ML IJ SOLN
4.00 mg | Freq: Once | INTRAMUSCULAR | Status: AC
Start: 2022-10-15 — End: 2022-10-15
  Administered 2022-10-15: 4 mg via INTRAVENOUS
  Filled 2022-10-15: qty 2

## 2022-10-15 MED ORDER — KETOROLAC TROMETHAMINE 15 MG/ML IJ SOLN
15.00 mg | Freq: Once | INTRAMUSCULAR | Status: AC
Start: 2022-10-15 — End: 2022-10-15
  Administered 2022-10-15: 15 mg via INTRAVENOUS
  Filled 2022-10-15: qty 1

## 2022-10-15 MED ORDER — ALUMINUM & MAGNESIUM HYDROXIDE 200-200 MG/5ML PO SUSP
30.00 mL | Freq: Once | ORAL | Status: AC
Start: 2022-10-15 — End: 2022-10-15
  Administered 2022-10-15: 30 mL via ORAL
  Filled 2022-10-15: qty 30

## 2022-10-15 MED ORDER — ALUMINUM-MAGNESIUM-SIMETHICONE 200-200-20 MG/5ML PO SUSP
10.00 mL | Freq: Once | ORAL | 0 refills | Status: AC
Start: 2022-10-15 — End: 2022-10-15

## 2022-10-15 MED ORDER — SODIUM CHLORIDE 0.9 % IV BOLUS
1000.0000 mL | Freq: Once | INTRAVENOUS | Status: AC
  Administered 2022-10-15: 1000 mL via INTRAVENOUS

## 2022-10-15 MED ORDER — NORMAL SALINE FLUSH 0.9 % IV SOLN
50.00 mL | Freq: Once | INTRAVENOUS | Status: AC
Start: 2022-10-15 — End: 2022-10-15
  Administered 2022-10-15: 50 mL via INTRAVENOUS

## 2022-10-15 NOTE — ED Triage Note (Signed)
PT C/O 3 DAYS OF BAD ACID REFLUX. WORSE WHEN SHE TRIES TO DRINK SOMETHING. NAUSEA BUT NO VOMITING. C/O PAIN AND BLOATING IN THE L SIDE OF HER ABDOMEN. NO DIARRHEA. LAST BM WAS YESTERDAY.

## 2022-10-15 NOTE — Narrator Note (Signed)
Pt updated on plan of care, still waiting on CT results. Pt aware.

## 2022-10-15 NOTE — Narrator Note (Signed)
IV team at bedside 

## 2022-10-15 NOTE — Discharge Instructions (Addendum)
Abdominal Pain     You came in today because you were having abdominal pain. Based on your clinical history, physical exam and tests, we do not believe there is anything emergently dangerous causing your symptoms.      The doctor who examined you has not found a serious cause of the abdominal pain at this time.     Your labs showed no signs of urinary tract infection, liver, kidney or biliary dysfunction, pancreatitis, anemia, electrolyte abnormalities or elevated inflammatory markers.  Your imaging tests showed no acute intra-abdominal pathology which means we did not find any severe disease process in the abdomen, you did have an inflamed stomach lining which may be from heartburn.    It is important that you carefully watch for changes in the abdominal pain that might suggest a serious condition (such as appendicitis that is difficult to diagnose early).     See your doctor or return to the emergency department immediately if your condition gets worse.     You can also call us if you are not sure what to do.     See your doctor tomorrow or as soon as possible if you are not getting better.     These symptoms suggest serious causes of abdominal pain. Call your doctor or return to the emergency department if you are feeling worse or if:     1. You are unable to walk easily or are walking in a bent-over position.   2. Stepping or jumping results in severe pain.   3. You are experiencing pain in the right lower part of the abdomen.   4. The abdomen is hard and painful when you press on it.   5. There is severe abdominal pain when coughing.   6. You are vomiting or gagging.   7. Vomiting is bloody or green or looks like chocolate or coffee.   8. The abdomen looks very full or big.   9. You are experiencing severe pain every 3 to 20 minutes.   10. Stool (poop) is bloody or black.   11. You are drowsy, weak, or pale    PLEASE RETURN TO THE EMERGENCY ROOM IF YOU EXPERIENCE ANY NEW OR WORSENING SYMPTOMS.

## 2022-10-15 NOTE — ED Provider Notes (Signed)
EMERGENCY DEPARTMENT PHYSICIAN ASSISTANT NOTE    The ED nursing record was reviewed.   The prior medical records as available electronically through Epic were reviewed.    This patient was seen with Emergency Department attending physician Dr. Joyce Copa who evaluated the patient independently and agrees with assessment and plan    CHIEF COMPLAINT    Patient presents with:  Abdominal Pain      HPI    Pasha Agers is a 52 year old female with past medical history of GERD and cholecystectomy presenting for evaluation of abdominal pain.  Patient experiencing epigastric abdominal comfort x 3 days.  Pain worse after meals.  Patient reports sensation of abdominal bloating with pain radiating to the chest and bitter taste in mouth.  She describes symptoms as consistent with history of GERD though worse in that she now has generalized abdominal discomfort and symptoms have not improved with prescribed omeprazole.  She denies fever, vomiting, diarrhea, chest pain, difficulty breathing, urinary symptoms, bloody stools, constipation, or vaginal bleeding.  Last bowel movement yesterday and normal.    REVIEW OF SYSTEMS     The pertinent positives are reviewed in the HPI above. All other systems were reviewed and are negative.    PAST MEDICAL HISTORY    Past Medical History:  06/12/2004: ACUTE GASTRITIS W/O HEMORRHAGE      Comment:  H. Pylori positive (done at [facility]); GI consult Dr.               Sharrie Rothman, TOC 10/01/04  03/26/2004: ALLERGY, UNSPECIFIED      Comment:  Seen by ENT, deviated septum, hypertrophy of inferior                nasal turbinates  No date: Anxiety  No date: Asthma  07/26/2004: CLOSTRIDIUM DIFFICILE      Comment:  GI cosult: Dr. Suzie Portela, TOC done on 10/01/04  No date: Elevated cholesterol      Comment:  resolved without meds  No date: Esophageal reflux  No date: Snores  No date: Stomach disease  No date: Unspecified asthma(493.90)  No date: Wears eyeglasses      Comment:  night driving    PROBLEM  LIST  Patient Active Problem List:     Allergy     Acute gastritis without mention of hemorrhage     Intestinal infection due to Clostridium difficile     Pure hypercholesterolemia     Major depressive disorder, recurrent episode, moderate with anxious distress (HCC)     Generalized anxiety disorder     Benign positional vertigo     Contraceptive management     Tubular adenoma of colon     GERD (gastroesophageal reflux disease)     Diarrhea     Cholecystitis     Epigastric abdominal pain     Mass of ampulla of Vater     Gastroesophageal reflux disease with esophagitis     Obesity     Colon polyp     Mucosal abnormality of duodenum     Abnormal LFTs     Allergic rhinitis due to pollen     Anisometropic amblyopia of right eye     Barrett's esophagus     Disorder of biliary tract     Gastroesophageal reflux disease     Mild intermittent asthma without complication     Prediabetes     Tinea pedis of both feet     Perimenopause     Hyperprolactinemia (HCC)  Morbid obesity with BMI of 50.0-59.9, adult (HCC)     Hyperlipidemia     Abnormal MRI of head     Headache disorder      SURGICAL HISTORY    Past Surgical History:  No date: CHOLECYSTECTOMY  No date: NO SIGNIFICANT SURGICAL HISTORY  No date: OB ANTEPARTUM CARE CESAREAN DLVR & POSTPARTUM    CURRENT MEDICATIONS    No current facility-administered medications for this encounter.    Current Outpatient Medications:     omeprazole (PRILOSEC) 40 MG capsule, Take 1 capsule by mouth daily, Disp: 30 capsule, Rfl: 2    atorvastatin (LIPITOR) 40 MG tablet, Take 1 tablet by mouth daily, Disp: 30 tablet, Rfl: 3    DULoxetine (CYMBALTA) 30 MG capsule, Take 1 each morning., Disp: 30 capsule, Rfl: 0    naproxen (NAPROSYN) 500 MG tablet, Take 1 tablet by mouth every 12 (twelve) hours as needed as needed for pain. Take with food  for up to 15 days, Disp: 20 tablet, Rfl: 0    albuterol (PROVENTIL HFA,VENTOLIN HFA, PROAIR HFA) 108 (90 Base) MCG/ACT inhaler, Inhale 2 puffs into the  lungs every 4 (four) hours as needed for Wheezing or Shortness of breath, Disp: 1 Inhaler, Rfl: 1    ALLERGIES    Review of Patient's Allergies indicates:   Erythromycin            Itching   Erythromycin            Itching   Macrolides and keto*    Itching, Rash    FAMILY HISTORY    Review of patient's family history indicates:  Problem: OTHER      Relation: Father          Age of Onset: (Not Specified)          Comment: leukemia   Problem: Hypertension      Relation: Father          Age of Onset: (Not Specified)  Problem: Lipids      Relation: Mother          Age of Onset: (Not Specified)  Problem: OTHER      Relation: Mother          Age of Onset: (Not Specified)          Comment: ESRD on HD  Problem: Diabetes      Relation: Mother          Age of Onset: (Not Specified)  Problem: Hypertension      Relation: Mother          Age of Onset: (Not Specified)  Problem: Renal      Relation: Mother          Age of Onset: (Not Specified)          Comment: on dialysis  Problem: Hypertension      Relation: Brother          Age of Onset: (Not Specified)      SOCIAL HISTORY    Social History     Socioeconomic History    Marital status: Married     Spouse name: Not on file    Number of children: Not on file    Years of education: Not on file    Highest education level: Not on file   Occupational History    Not on file   Tobacco Use    Smoking status: Never    Smokeless tobacco: Never   Substance and Sexual Activity  Alcohol use: No    Drug use: No    Sexual activity: Yes     Partners: Male     Comment: men age 52 q 4-6 wks x 3d, small fibroid on u/s, 1st SA age 40, 2 lifetime partners, current x 8 yrs   Other Topics Concern    Not on file   Social History Narrative    07/09/2005    To [name] from the [place], [place] with family at age 47, husband from [place]    Works FT - [employer]    Pt thrilled with pregnancy-seeking pregnancy x 4 yrs, very anxious since sab 2 yrs ago    Has one child    Not working now     Lives with husband and daughter    Ellyn Hack, MD, 05/13/2016, 1:50 PM   Social Determinants of Health  Financial Resource Strain: Not on File (05/24/2019)      Received from [employer], [employer]      Financial [employer]          Financial Resource Strain: 0  Food Insecurity: Not on File (05/24/2019)      Received from [place], [employer] Insecurity          Food: 0  Transportation Needs: Not on File (05/24/2019)      Received from [employer], [employer] Needs          Transportation: 0  Physical Activity: Not on File (05/24/2019)      Received from [place], [place]      Physical Activity          Physical Activity: 0  Stress: Not on File (05/24/2019)      Received from [facility], [place]      Stress          Stress: 0  Social Connections: Not on File (05/24/2019)      Received from [place], [place]      Social Connections          Social Connections and Isolation: 0  Intimate Partner Violence: Not on file  Housing Stability: Not on File (05/24/2019)      Received from [employer], [employer]      Housing Stability          Housing: 0      PHYSICAL EXAM      Vital Signs: BP 141/86   Pulse 91   Temp 97.8 F   Resp 17   Wt 133.8 kg (295 lb)   SpO2 96%   BMI 52.26 kg/m      Constitutional:  Well-nourished, well-developed, Non-toxic appearing.   Head: Normocephalic, atraumatic  Neck: Supple, full range of motion  Eyes: PERRL, EOMI  ENT: Airway is patent.  Moist mucous membranes.  Cardio.: RRR.  2+ radial pulses bilaterally.  Pulmonary: Lungs clear to auscultation bilaterally. No wheezes, rales or rhonchi;  Non-labored breathing   Abd. Soft, non-tender, non-distended.   Musculoskeletal:  Moving all 4 extremities  Neurological: AOx3 with clear speech, follows commands.  Ambulatory with steady gait  Skin: Warm and dry, no rashes  Psychiatric: Appropriate for age and situation.        RESULTS  Results for orders placed or performed during the hospital encounter of 10/15/22 (from the past 24 hour(s))   CBC, Platelet & Differential     Collection Time: 10/15/22  4:33 PM   Result Value    WHITE BLOOD CELL COUNT  9.9    RED BLOOD CELL COUNT 4.66    HEMOGLOBIN 14.1    HEMATOCRIT 43.6    MEAN CORPUSCULAR VOL 93.6    MEAN CORPUSCULAR HGB 30.3    MEAN CORP HGB CONC 32.3    RBC DISTRIBUTION WIDTH STD DEV 43.0    PLATELET COUNT 249    MEAN PLATELET VOLUME 9.5    NEUTROPHIL % 63.6    IMMATURE GRANULOCYTE % 0.3    LYMPHOCYTE % 30.2    MONOCYTE % 4.8    EOSINOPHIL % 0.6    BASOPHIL % 0.5    NRBC % 0.0    ABSOLUTE NEUTROPHIL COUNT 6.3    ABSOLUTE IMM GRAN COUNT 0.03    ABSOLUTE LYMPH COUNT 3.0    ABSOLUTE MONO COUNT 0.5    ABSOLUTE EOSINOPHIL COUNT 0.1    ABSOLUTE BASO COUNT 0.1    ABSOLUTE NRBC COUNT 0.0   Basic Metabolic Panel    Collection Time: 10/15/22  4:33 PM   Result Value    SODIUM 139    POTASSIUM 4.1    CHLORIDE 98    CARBON DIOXIDE 27    ANION GAP 14    CALCIUM 9.7    Glucose Random 86    BUN (UREA NITROGEN) 16    CREATININE 0.8    ESTIMATED GLOMERULAR FILT RATE > 60   Hepatic Function Panel    Collection Time: 10/15/22  4:33 PM   Result Value    TOTAL PROTEIN 7.7    ALBUMIN 4.4    BILIRUBIN TOTAL 0.3    BILIRUBIN DIRECT < 0.2    INDIRECT BILIRUBIN TNP    ALKALINE PHOSPHATASE 100    ASPARTATE AMINOTRANSFERASE 26    ALANINE AMINOTRANSFERASE 25   Lipase    Collection Time: 10/15/22  4:33 PM   Result Value    LIPASE 15   Urinalysis Rflx to Urine Cult    Collection Time: 10/15/22  4:59 PM   Result Value    COLOR YELLOW    CLARITY CLEAR    GLUCOSE, URINE NEGATIVE    BILIRUBIN, URINE NEGATIVE    KETONE, URINE NEGATIVE    SPECIFIC GRAVITY URINE 1.020    OCCULT BLOOD, URINE NEGATIVE    PH URINE 6.0    PROTEIN, URINE NEGATIVE    NITRITE, URINE NEGATIVE    LEUKOCYTE ESTERASE NEGATIVE    MICROSCOPIC NOT INDICATED    Narrative    UCV&Urine, Clean Void            PROCEDURES      MEDICATIONS ADMINISTERED ON THIS VISIT  Orders Placed This Encounter      sodium chloride 0.9 % IV bolus 1,000 mL      ondansetron (ZOFRAN) injection 4 mg      famotidine (PEPCID)  injection 20 mg      AND Linked Order Group          lidocaine (XYLOCAINE) 2 % viscous solution 10 mL          aluminum-magnesium hydroxide (MAALOX) 200-200 mg/5 mL suspension 30 mL      ketorolac (TORADOL) injection 15 mg      ED COURSE & MEDICAL DECISION MAKING      I reviewed the patient's past medical history/problem list, past surgical history, medication list, social history and allergies.    ED Decision Making & Course: Pt is a 52 year old female with past medical history of GERD and cholecystectomy presenting with 3 days of postprandial epigastric  discomfort.  Present stable.  Afebrile.  Comfortable appearing.  No acute distress.  Benign abdomen.  Labs unremarkable.  LFTs normal.  UA without evidence of infection.  On reassessment patient reports symptoms improved but unresolved after receiving Toradol, Pepcid, Zofran, GI cocktail.  Patient reports she continues to feel discomfort in her left lower quadrant.  CT ordered for further investigation.  Patient signed out to Dr. Joyce Copa at end of shift in stable condition with CT imaging pending.

## 2022-10-15 NOTE — Narrator Note (Signed)
Assuming care of pt at this time

## 2022-10-15 NOTE — Narrator Note (Signed)
1 IV ATTEMPT BY BOB M. RN WAS ABLE TO GET THE BLOOD NO IV. I MADE 1 IV ATTEMPT UNSUCCESSFUL. IV TEAM PAGED.

## 2022-10-15 NOTE — Narrator Note (Signed)
Patient Disposition  Patient education for diagnosis, medications, activity, diet and follow-up.  Patient left ED 10:59 PM.  Patient rep received written instructions.    Interpreter to provide instructions: No    Patient belongings with patient: YES    Have all existing LDAs been addressed? Yes    Have all IV infusions been stopped? Yes    Destination: Discharged to home

## 2022-10-19 LAB — EKG

## 2022-12-04 ENCOUNTER — Encounter (HOSPITAL_BASED_OUTPATIENT_CLINIC_OR_DEPARTMENT_OTHER): Payer: Self-pay

## 2022-12-04 ENCOUNTER — Emergency Department
Admission: EM | Admit: 2022-12-04 | Discharge: 2022-12-04 | Disposition: A | Discharge status: Home / Self Care | Payer: 59 | Attending: Physician Assistant | Admitting: Physician Assistant

## 2022-12-04 ENCOUNTER — Other Ambulatory Visit: Payer: Self-pay

## 2022-12-04 DIAGNOSIS — Z1152 Encounter for screening for COVID-19: Secondary | ICD-10-CM | POA: Insufficient documentation

## 2022-12-04 DIAGNOSIS — K2100 Gastro-esophageal reflux disease with esophagitis, without bleeding: Secondary | ICD-10-CM | POA: Insufficient documentation

## 2022-12-04 DIAGNOSIS — K146 Glossodynia: Secondary | ICD-10-CM | POA: Insufficient documentation

## 2022-12-04 DIAGNOSIS — K149 Disease of tongue, unspecified: Secondary | ICD-10-CM

## 2022-12-04 DIAGNOSIS — R030 Elevated blood-pressure reading, without diagnosis of hypertension: Secondary | ICD-10-CM | POA: Insufficient documentation

## 2022-12-04 DIAGNOSIS — J029 Acute pharyngitis, unspecified: Secondary | ICD-10-CM | POA: Diagnosis present

## 2022-12-04 LAB — RESPIRATORY PANEL BASIC INPAT
INFLUENZA A: NEGATIVE
INFLUENZA B: NEGATIVE
RESPIRATORY SYNCYTIAL VIRUS: NEGATIVE
SARS-COV-2: NEGATIVE

## 2022-12-04 LAB — RAPID STREP (POINT OF CARE): STREP SCREEN: NEGATIVE

## 2022-12-04 MED ORDER — SUCRALFATE 1 GM/10ML PO SUSP
1.00 g | Freq: Once | ORAL | Status: AC
Start: 2022-12-04 — End: 2022-12-04
  Administered 2022-12-04: 1 g via ORAL
  Filled 2022-12-04: qty 10

## 2022-12-04 MED ORDER — FAMOTIDINE 20 MG PO TABS
20.0000 mg | ORAL_TABLET | Freq: Once | ORAL | Status: AC
Start: 2022-12-04 — End: 2022-12-04
  Administered 2022-12-04: 20 mg via ORAL
  Filled 2022-12-04: qty 1

## 2022-12-04 MED ORDER — LIDOCAINE VISCOUS HCL 2 % MT SOLN
10.0000 mL | Freq: Once | OROMUCOSAL | Status: AC
Start: 2022-12-04 — End: 2022-12-04
  Administered 2022-12-04: 10 mL via OROMUCOSAL
  Filled 2022-12-04: qty 15

## 2022-12-04 MED ORDER — ONDANSETRON 4 MG PO TBDP
4.00 mg | ORAL_TABLET | Freq: Three times a day (TID) | ORAL | 0 refills | Status: AC | PRN
Start: 2022-12-04 — End: 2022-12-07

## 2022-12-04 MED ORDER — MENTHOL 3 MG MT LOZG
1.0000 | LOZENGE | OROMUCOSAL | 0 refills | Status: DC | PRN
Start: 2022-12-04 — End: 2022-12-04

## 2022-12-04 MED ORDER — SUCRALFATE 1 GM/10ML PO SUSP
1.00 g | Freq: Four times a day (QID) | ORAL | 0 refills | Status: AC
Start: 2022-12-04 — End: 2022-12-11

## 2022-12-04 MED ORDER — MENTHOL 3 MG MT LOZG
1.00 | LOZENGE | OROMUCOSAL | 0 refills | Status: AC | PRN
Start: 2022-12-04 — End: 2022-12-07

## 2022-12-04 MED ORDER — ONDANSETRON 4 MG PO TBDP
4.0000 mg | ORAL_TABLET | Freq: Once | ORAL | Status: AC
Start: 2022-12-04 — End: 2022-12-04
  Administered 2022-12-04: 4 mg via SUBLINGUAL
  Filled 2022-12-04: qty 1

## 2022-12-04 MED ORDER — SUCRALFATE 1 GM/10ML PO SUSP
1.0000 g | Freq: Four times a day (QID) | ORAL | 0 refills | Status: DC
Start: 2022-12-04 — End: 2022-12-04

## 2022-12-04 MED ORDER — ONDANSETRON 4 MG PO TBDP
4.0000 mg | ORAL_TABLET | Freq: Three times a day (TID) | ORAL | 0 refills | Status: DC | PRN
Start: 2022-12-04 — End: 2022-12-04

## 2022-12-04 NOTE — Discharge Instructions (Addendum)
Today you were seen for your throat and tongue irritation.  I suspect this is likely acid reflux irritating the esophagus on the tongue.  Try to avoid foods that can be irritating, coffee, spicy foods.  Continue with the omeprazole.  Can use Carafate to help soothe the stomach and esophagus.  If you have nausea, you can take the Zofran.  Can use Cepacol lozenges to help with throat and tongue irritation/pain.    Please follow-up with your primary care doctor in the next few days for reevaluation of any ongoing symptoms    Please return if you develop any new or worsening symptoms, difficulty swallowing, difficulty breathing, abdominal pain, chest pain.

## 2022-12-04 NOTE — Narrator Note (Signed)
Patient Disposition  Patient education for diagnosis, medications, activity, diet and follow-up.  Patient left ED 6:43 PM.  Patient rep received written instructions.    Interpreter to provide instructions: No    Patient belongings with patient: YES    Have all existing LDAs been addressed? N/A    Have all IV infusions been stopped? N/A    Destination: Discharged to home

## 2022-12-04 NOTE — ED Provider Notes (Signed)
 eMERGENCY dEPARTMENT Physician Assistant NOTE    The ED nursing record was reviewed.   The prior medical records as available electronically through Epic were reviewed.  The mode of arrival was Self       CHIEF COMPLAINT    Patient presents with:  Pharyngitis  Nausea        HPI    Alejandra Martin is a 52 year old female with past medical history of acute gastritis, GERD, H. pylori, asthma, presents for evaluation of mouth/throat irritation, reflux, bitter taste.  Reports symptoms ongoing for the last 3 days.  Feels like she has more reflux than usual, taking omeprazole as prescribed.  Notes a bitter taste in her mouth.  States symptoms will make her feel nauseated.  No vomiting.  Felt like she was having some chills over the last few days but no fevers.  States she has irritation of her tongue as well as areas of white that are concerning her.  States that her symptoms improved she takes Tylenol, DayQuil, tea with honey.  Denies any chest pain, difficulty breathing, abdominal pain, diarrhea, urinary symptoms.      PAST MEDICAL HISTORY    Past Medical History:  06/12/2004: ACUTE GASTRITIS W/O HEMORRHAGE      Comment:  H. Pylori positive (done at [facility]); GI consult Dr.               Sharrie Rothman, TOC 10/01/04  03/26/2004: ALLERGY, UNSPECIFIED      Comment:  Seen by ENT, deviated septum, hypertrophy of inferior                nasal turbinates  No date: Anxiety  No date: Asthma  07/26/2004: CLOSTRIDIUM DIFFICILE      Comment:  GI cosult: Dr. Suzie Portela, TOC done on 10/01/04  No date: Elevated cholesterol      Comment:  resolved without meds  No date: Esophageal reflux  No date: Snores  No date: Stomach disease  No date: Unspecified asthma(493.90)  No date: Wears eyeglasses      Comment:  night driving    PROBLEM LIST  Patient Active Problem List:     Allergy     Acute gastritis without mention of hemorrhage     Intestinal infection due to Clostridium difficile     Pure hypercholesterolemia     Major depressive disorder,  recurrent episode, moderate with anxious distress (HCC)     Generalized anxiety disorder     Benign positional vertigo     Contraceptive management     Tubular adenoma of colon     GERD (gastroesophageal reflux disease)     Diarrhea     Cholecystitis     Epigastric abdominal pain     Mass of ampulla of Vater     Gastroesophageal reflux disease with esophagitis     Obesity     Colon polyp     Mucosal abnormality of duodenum     Abnormal LFTs     Allergic rhinitis due to pollen     Anisometropic amblyopia of right eye     Barrett's esophagus     Disorder of biliary tract     Gastroesophageal reflux disease     Mild intermittent asthma without complication     Prediabetes     Tinea pedis of both feet     Perimenopause     Hyperprolactinemia (HCC)     Morbid obesity with BMI of 50.0-59.9, adult (HCC)     Hyperlipidemia  Abnormal MRI of head     Headache disorder        SURGICAL HISTORY    Past Surgical History:  No date: CHOLECYSTECTOMY  No date: NO SIGNIFICANT SURGICAL HISTORY  No date: OB ANTEPARTUM CARE CESAREAN DLVR & POSTPARTUM    CURRENT MEDICATIONS    No current facility-administered medications for this encounter.    Current Outpatient Medications:     sucralfate (CARAFATE) 1 GM/10ML suspension, Take 10 mLs by mouth in the morning and 10 mLs at noon and 10 mLs in the evening and 10 mLs before bedtime. Do all this for 7 days., Disp: 280 mL, Rfl: 0    naproxen (NAPROSYN) 500 MG tablet, Take 1 tablet by mouth every 12 (twelve) hours as needed as needed for pain. Take with food  for up to 15 days, Disp: 20 tablet, Rfl: 0    omeprazole (PRILOSEC) 40 MG capsule, Take 1 capsule by mouth daily, Disp: 30 capsule, Rfl: 2    atorvastatin (LIPITOR) 40 MG tablet, Take 1 tablet by mouth daily, Disp: 30 tablet, Rfl: 3    DULoxetine (CYMBALTA) 30 MG capsule, Take 1 each morning., Disp: 30 capsule, Rfl: 0    albuterol (PROVENTIL HFA,VENTOLIN HFA, PROAIR HFA) 108 (90 Base) MCG/ACT inhaler, Inhale 2 puffs into the lungs every 4  (four) hours as needed for Wheezing or Shortness of breath, Disp: 1 Inhaler, Rfl: 1    ALLERGIES    Review of Patient's Allergies indicates:   Erythromycin            Itching   Erythromycin            Itching   Macrolides and keto*    Itching, Rash    FAMILY HISTORY    Review of patient's family history indicates:  Problem: OTHER      Relation: Father          Age of Onset: (Not Specified)          Comment: leukemia   Problem: Hypertension      Relation: Father          Age of Onset: (Not Specified)  Problem: Lipids      Relation: Mother          Age of Onset: (Not Specified)  Problem: OTHER      Relation: Mother          Age of Onset: (Not Specified)          Comment: ESRD on HD  Problem: Diabetes      Relation: Mother          Age of Onset: (Not Specified)  Problem: Hypertension      Relation: Mother          Age of Onset: (Not Specified)  Problem: Renal      Relation: Mother          Age of Onset: (Not Specified)          Comment: on dialysis  Problem: Hypertension      Relation: Brother          Age of Onset: (Not Specified)        SOCIAL HISTORY    Social History     Socioeconomic History    Marital status: Married     Spouse name: Not on file    Number of children: Not on file    Years of education: Not on file    Highest education level: Not on file  Occupational History    Not on file   Tobacco Use    Smoking status: Never    Smokeless tobacco: Never   Substance and Sexual Activity    Alcohol use: No    Drug use: No    Sexual activity: Yes     Partners: Male     Comment: men age 61 q 4-6 wks x 3d, small fibroid on u/s, 1st SA age 2, 2 lifetime partners, current x 8 yrs   Other Topics Concern    Not on file   Social History Narrative    07/09/2005    To [name] from the [place], [place] with family at age 32, husband from [place]    Works FT - [employer]    Pt thrilled with pregnancy-seeking pregnancy x 4 yrs, very anxious since sab 2 yrs ago    Has one child    Not working now    Lives with  husband and daughter    Ellyn Hack, MD, 05/13/2016, 1:50 PM   Social Determinants of Health  Financial Resource Strain: Not on File (05/24/2019)      Received from [employer], [employer]      [employer]          Financial Resource Strain: 0  Food Insecurity: Not on File (05/24/2019)      Received from [place], [place]      Food Insecurity          Food: 0  Transportation Needs: Not on File (05/24/2019)      Received from [place], [employer]eds          Transportation: 0  Physical Activity: Not on File (05/24/2019)      Received from [place], [place]      Physical Activity          Physical Activity: 0  Stress: Not on File (05/24/2019)      Received from [facility], [place]      Stress          Stress: 0  Social Connections: Not on File (05/24/2019)      Received from [place], [place]      Social Connections          Social Connections and Isolation: 0  Intimate Partner Violence: Not on file  Housing Stability: Not on File (05/24/2019)      Received from [place], [employer] Stability          Housing: 0    REVIEW OF SYSTEMS    The pertinent positives are reviewed in the HPI above. All other systems were reviewed and are negative.    PHYSICAL EXAM      BP 180/99   Pulse 91   Temp 97.8 ?F   Resp 18   SpO2 97%   GENERAL: Well-appearing, no distress.  SKIN:  Warm & Dry, no rash, no bruising.  HEAD:  Atraumatic.   EYES: PERRL. EOMI. Anicteric sclera.   [id number] clear with moist mucous membranes.    No tonsillar erythema, swelling or exudates.  Uvula midline.  No soft palate or posterior pharynx swelling.  No trismus or drooling.  Tongue: Slight irritation of the papilla on tongue.  White film to the back of the tongue, able to be removed.  No sublingual or submandibular swelling.  NECK:  Supple with full painless ROM at the neck. No LAD.  LUNGS:  Clear to auscultation bilaterally without rales,  rhonchi or wheezing.   HEART:  Regular rate and rhythm.  No murmurs, rubs, or gallops.   ABDOMEN:  Soft, flat, without  distension.  No abdominal tenderness.  No guarding, rigidity or masses.  MUSCULOSKELETAL:  No deformities. No cyanosis or edema.  NEUROLOGIC:  Normal speech.  Upper and lower extremity motor and sensory gros

## 2022-12-04 NOTE — ED Triage Note (Signed)
Patient c/o sore throat, nausea and chills X 3 days.  Denies fever, sick contacts.  Reports taking tylenol with minimal relief.

## 2022-12-07 LAB — THROAT CULTURE BETA STREP

## 2022-12-12 ENCOUNTER — Telehealth (HOSPITAL_BASED_OUTPATIENT_CLINIC_OR_DEPARTMENT_OTHER): Payer: Self-pay | Admitting: Gastroenterology

## 2022-12-12 NOTE — Telephone Encounter (Signed)
Please call pt. Who has a stomach pain and performe a colonoscopy and Endoscopy last year and wants to see Dr. Suzie Portela.  I offered to see one our [place] but she insisted  to see Dr. Suzie Portela only.     Thanks,  KT

## 2022-12-12 NOTE — Telephone Encounter (Signed)
Returned patients call. No answer, left a voicemail for a call back.

## 2023-05-21 ENCOUNTER — Other Ambulatory Visit: Payer: Self-pay | Admitting: Internal Medicine

## 2023-07-23 LAB — HEMOGLOBIN A1C CARE EVERYWHERE
ESTIMATED AVERAGE GLUCOSE CARE EVERYWHERE: 128 mg/dL — ABNORMAL HIGH (ref 68–126)
HEMOGLOBIN A1C CARE EVERYWHERE: 6.1 % — ABNORMAL HIGH (ref 4.8–5.9)

## 2023-11-04 LAB — HEMOGLOBIN A1C CARE EVERYWHERE
ESTIMATED AVERAGE GLUCOSE CARE EVERYWHERE: 120 mg/dL (ref 68–126)
HEMOGLOBIN A1C CARE EVERYWHERE: 5.8 % (ref 4.8–5.9)

## 2023-11-23 ENCOUNTER — Encounter (HOSPITAL_BASED_OUTPATIENT_CLINIC_OR_DEPARTMENT_OTHER): Payer: Self-pay

## 2023-11-23 NOTE — Endoscopy Dept Note (Signed)
 RN Assessment Note    Patient Name: Alejandra Martin  DOB:  1970-09-21   MRN:  [id number]      CLINICAL QUESTIONS:    Height/Weight/BMI:    There is no height or weight on file to calculate BMI.   Is the patient on an ANTIPLATELET agent?     Provider Recommendation:         Is the patient on an ANTICOAGULANT?     Provider Recommendation:                 Need for Pre-Procedure GI Appointment (for unstable medical condition):      Pending Cardiology Studies:          Patient with Pending Cardiac Echo:     Order on HOLD Non-Cardiac Reason:    Colonoscopy Two-Day Prep Needed:     Additional Pertinent Information:       RN Clinical Comments:       Problem List:    Patient Active Problem List:     Allergy     Acute gastritis without mention of hemorrhage     Intestinal infection due to Clostridium difficile     Pure hypercholesterolemia     Major depressive disorder, recurrent episode, moderate with anxious distress (HCC)     Generalized anxiety disorder     Benign positional vertigo     Contraceptive management     Tubular adenoma of colon     GERD (gastroesophageal reflux disease)     Diarrhea     Cholecystitis     Epigastric abdominal pain     Mass of ampulla of Vater     Gastroesophageal reflux disease with esophagitis     Obesity     Colon polyp     Mucosal abnormality of duodenum     Abnormal LFTs     Allergic rhinitis due to pollen     Anisometropic amblyopia of right eye     Barrett's esophagus     Disorder of biliary tract     Gastroesophageal reflux disease     Mild intermittent asthma without complication     Prediabetes     Tinea pedis of both feet     Perimenopause     Hyperprolactinemia (HCC)     Morbid obesity with BMI of 50.0-59.9, adult (HCC)     Hyperlipidemia     Abnormal MRI of head     Headache disorder       Surgical History:    Past Surgical History:  No date: CHOLECYSTECTOMY  No date: NO SIGNIFICANT SURGICAL HISTORY  No date: OB ANTEPARTUM CARE CESAREAN DLVR & POSTPARTUM     Outpatient Medications:     naproxen  (NAPROSYN ) 500 MG tablet, Take 1 tablet by mouth every 12 (twelve) hours as needed as needed for pain. Take with food  for up to 15 days, Disp: 20 tablet, Rfl: 0  omeprazole  (PRILOSEC ) 40 MG capsule, Take 1 capsule by mouth daily, Disp: 30 capsule, Rfl: 2  atorvastatin  (LIPITOR) 40 MG tablet, Take 1 tablet by mouth daily, Disp: 30 tablet, Rfl: 3  DULoxetine  (CYMBALTA ) 30 MG capsule, Take 1 each morning., Disp: 30 capsule, Rfl: 0  albuterol  (PROVENTIL  HFA,VENTOLIN  HFA, PROAIR  HFA) 108 (90 Base) MCG/ACT inhaler, Inhale 2 puffs into the lungs every 4 (four) hours as needed for Wheezing or Shortness of breath, Disp: 1 Inhaler, Rfl: 1    No current facility-administered medications on file prior to visit.       Allergies:  Review of Patient's Allergies indicates:   Erythromycin            Itching   Macrolides and keto*    Itching, Rash     SCHEDULING INFORMATION:  Preferred Location: [facility] or [place]  Sedation Type: Total Intravenous Anesthesia (TIVA)  Preferred Physician:         Primary Reason for COLONOSCOPY:         Primary Reason for EGD:                                 Primary Reason for FLEXIBLE SIGMOIDOSCOPY:         RN Notes to Scheduler:  hospital/any/MAC-? BMI 52      RN Assessment Complete: Yes      Wolm Don, RN

## 2023-11-23 NOTE — Progress Notes (Signed)
 Received external egd/colonoscopy order for GERD. From review of record colonoscopy not due until 11/2024, fax sent to referring provider, awaiting response.  When response is received patient will require hospital setting for BMI > 45.
